# Patient Record
Sex: Male | Born: 1955 | Race: Black or African American | Hispanic: No | Smoking: Current every day smoker
Health system: Southern US, Community
[De-identification: ages and names within clinical notes are randomized; demographics above are authoritative.]

## PROBLEM LIST (undated history)

## (undated) DIAGNOSIS — I10 Essential (primary) hypertension: Secondary | ICD-10-CM

## (undated) DIAGNOSIS — N184 Chronic kidney disease, stage 4 (severe): Secondary | ICD-10-CM

## (undated) DIAGNOSIS — I82409 Acute embolism and thrombosis of unspecified deep veins of unspecified lower extremity: Secondary | ICD-10-CM

## (undated) DIAGNOSIS — B1920 Unspecified viral hepatitis C without hepatic coma: Secondary | ICD-10-CM

## (undated) DIAGNOSIS — E162 Hypoglycemia, unspecified: Secondary | ICD-10-CM

## (undated) DIAGNOSIS — Z91148 Patient's other noncompliance with medication regimen for other reason: Secondary | ICD-10-CM

## (undated) DIAGNOSIS — Z9114 Patient's other noncompliance with medication regimen: Secondary | ICD-10-CM

## (undated) DIAGNOSIS — I509 Heart failure, unspecified: Secondary | ICD-10-CM

## (undated) DIAGNOSIS — E109 Type 1 diabetes mellitus without complications: Secondary | ICD-10-CM

## (undated) DIAGNOSIS — E119 Type 2 diabetes mellitus without complications: Secondary | ICD-10-CM

## (undated) DIAGNOSIS — R569 Unspecified convulsions: Secondary | ICD-10-CM

## (undated) DIAGNOSIS — M1990 Unspecified osteoarthritis, unspecified site: Secondary | ICD-10-CM

## (undated) DIAGNOSIS — I639 Cerebral infarction, unspecified: Secondary | ICD-10-CM

## (undated) DIAGNOSIS — A159 Respiratory tuberculosis unspecified: Secondary | ICD-10-CM

## (undated) HISTORY — PX: LACERATION REPAIR: SHX5168

## (undated) HISTORY — DX: Type 2 diabetes mellitus without complications: E11.9

## (undated) HISTORY — PX: OTHER SURGICAL HISTORY: SHX169

---
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
## 2007-01-16 ENCOUNTER — Encounter: Payer: Self-pay | Admitting: Cardiology

## 2008-03-05 ENCOUNTER — Encounter: Payer: Self-pay | Admitting: Cardiology

## 2008-11-17 ENCOUNTER — Ambulatory Visit
Admit: 2008-11-17 | Discharge: 2008-11-17 | Disposition: A | Discharge status: Home / Self Care | Payer: Self-pay | Source: Ambulatory Visit | Attending: Retina Ophthalmology | Admitting: Retina Ophthalmology

## 2008-11-24 ENCOUNTER — Ambulatory Visit
Admit: 2008-11-24 | Discharge: 2008-11-24 | Disposition: A | Discharge status: Home / Self Care | Payer: Self-pay | Source: Ambulatory Visit | Attending: Optometry | Admitting: Optometry

## 2009-03-19 ENCOUNTER — Ambulatory Visit: Payer: Self-pay | Admitting: Retina Ophthalmology

## 2009-03-19 ENCOUNTER — Ambulatory Visit: Payer: Self-pay

## 2009-09-17 ENCOUNTER — Ambulatory Visit: Payer: Self-pay | Admitting: Retina Ophthalmology

## 2009-10-19 ENCOUNTER — Ambulatory Visit: Payer: Self-pay | Admitting: Retina Ophthalmology

## 2009-10-19 ENCOUNTER — Ambulatory Visit: Payer: Self-pay

## 2010-04-19 ENCOUNTER — Ambulatory Visit: Payer: Self-pay | Admitting: Retina Ophthalmology

## 2010-05-27 ENCOUNTER — Ambulatory Visit: Payer: Self-pay | Admitting: Retina Ophthalmology

## 2010-06-02 ENCOUNTER — Ambulatory Visit: Payer: Self-pay | Admitting: Retina Ophthalmology

## 2010-06-02 ENCOUNTER — Ambulatory Visit: Payer: Self-pay

## 2010-06-28 ENCOUNTER — Encounter: Payer: Self-pay | Admitting: Retina Ophthalmology

## 2010-06-28 ENCOUNTER — Ambulatory Visit: Payer: Self-pay

## 2010-06-28 ENCOUNTER — Ambulatory Visit: Payer: Self-pay | Admitting: Retina Ophthalmology

## 2010-06-28 NOTE — Ambulatory Procedure (Signed)
 Intravitreal Injection    Miguel Garza    [birth year]    Procedure Date: 06/28/2010    Order: Study Drug  Lot # [id number] Exp 03/2010    Eye: right    Allergies: Allergies not on file    Latex Allergy: No    Current Medications: No current outpatient prescriptions on file.      Procedure Note:    Proparacaine 0.5% gtt OD  Lidocaine 2% Jelly OD  Lid speculum  Betadine 10% swab to area  Specimens:   No orders of the defined types were placed in this encounter.       Quadrant ST  Central Retinal Artery perfusion: Yes  Complications: No.  Sterile Saline Eye Rinse: Yes.  Polytrim 1gtt OD          Procedure Comments: Procedure completed patient tolerated well. D/C home.  Pain Scale Pre-Procedure: 0  Pain Scale Post-Procedure: 0

## 2010-06-28 NOTE — Assessment & Plan Note (Signed)
 Study patient  [facility] protocol S  See study chart.

## 2010-07-29 ENCOUNTER — Ambulatory Visit: Payer: Self-pay

## 2010-07-29 ENCOUNTER — Encounter: Payer: Self-pay | Admitting: Retina Ophthalmology

## 2010-07-29 ENCOUNTER — Ambulatory Visit: Payer: Self-pay | Admitting: Retina Ophthalmology

## 2010-07-29 NOTE — Ambulatory Procedure (Signed)
Intravitreal Injection    Rorke Pagliarulo    09-15-1955    Procedure Date: 07/29/2010      Order:  Study Drug #[id number] OD  Lot # [id number]  Exp. March 2013  Eye: right    Allergies: Allergies not on file    Latex Allergy: No    Current Medications: No current outpatient prescriptions on file.      Procedure Note:        Specimens:   No orders of the defined types were placed in this encounter.       Quadrant  4 mm pl ST, lid speculum  Proparacaine 0.5% 1 drop OD  Lidocaine gel 2% OD  Central Retinal Artery perfusion: Yes  Complications: No.  Sterile Saline Eye Rinse: Yes.  Polytrim gtt 1:OD  Patch: No  Procedure Comments: Tolerated procedure well.  Instructions given to patient and discharged to home.  Billing sheet with patient for check-out   Pain Scale Pre-Procedure: 0  Pain Scale Post-Procedure: 0

## 2010-07-29 NOTE — Assessment & Plan Note (Signed)
Study patient  [facility] protocol S  See study chart.

## 2010-08-01 ENCOUNTER — Telehealth: Payer: Self-pay

## 2010-08-01 NOTE — Telephone Encounter (Signed)
Post study drug injection follow-up.  Voicemail message left to call if any vision change, pain or other issues.

## 2010-08-19 ENCOUNTER — Encounter: Payer: Self-pay | Admitting: Retina Ophthalmology

## 2010-08-19 ENCOUNTER — Ambulatory Visit: Payer: Self-pay

## 2010-08-19 ENCOUNTER — Ambulatory Visit: Payer: Self-pay | Admitting: Retina Ophthalmology

## 2010-08-19 NOTE — Assessment & Plan Note (Signed)
Study patient  [facility] protocol S  See study chart.

## 2010-08-19 NOTE — Ambulatory Procedure (Signed)
Intravitreal Injection    Miguel Garza    1955-03-19    Procedure Date: 08/19/2010    Order: Study Drug lot # [id number] Exp: 03/2011    Eye: right    Allergies: Allergies not on file    Latex Allergy: No    Current Medications: No current outpatient prescriptions on file.      Procedure Note:        Specimens:   No orders of the defined types were placed in this encounter.       Quadrant  Central Retinal Artery perfusion: Yes  Complications: No.  Sterile Saline Eye Rinse: Yes.    Polytrim gtt 1:OD  Patch: No          Procedure Comments: Proparacaine 0.5% 32 drops followed by 2% Xylocaine gel 1/4 inch topically to OD. Study drug OD per Dr. Johnnette Litter. Well tolerated. Discharged home, instructions provided.  Pain Scale Pre-Procedure: 0  Pain Scale Post-Procedure: 0

## 2010-09-16 ENCOUNTER — Ambulatory Visit: Payer: Self-pay | Admitting: Retina Ophthalmology

## 2010-09-16 ENCOUNTER — Ambulatory Visit: Payer: Self-pay

## 2010-09-16 ENCOUNTER — Encounter: Payer: Self-pay | Admitting: Retina Ophthalmology

## 2010-09-16 NOTE — Progress Notes (Signed)
Subjective:   Subjective8/17/2012   Chief Complaint   Patient presents with   . Diabetes     visoin stable     HPI     Study patient  [facility] protocol S  See study chart.        No current outpatient prescriptions on file.   Review of patient's allergies indicates not on file.   No past medical history on file.   Past Surgical History   Procedure Date   . Study drug injection od 06/28/10/ polytrim      consent 06/02/10   . Study drug #3 07/29/10 consent 07/29/10 polytrim start 06/02/10   . Study drug # 4 intravitreally 08/19/2010       History   Smoking status   . Not on file   Smokeless tobacco   . Not on file      History   Alcohol Use: Not on file      History   Drug Use Not on file      Specialty Problems             Ophthalmology Problems    Proliferative diabetic retinopathy                  Objective:   ObjectiveThere were no vitals filed for this visit.    Base Ophthalmology Exam        Visual Acuity    Right Left   Dist cc 20/20 20/50   Method: ETDRS   Tonometry    Right Left   Pressure 14 17   Method: Tonopen   Time: 9:03 AM   Dilation   Right eye: 2.5% Phenylephrine, 1.0% Tropicamide @ 9:04 AM      Main Ophthalmology Exam        External Exam    Right Left   External Normal Normal   Slit Lamp Exam    Right Left   Lids/Lashes Normal Normal   Conjunctiva/Sclera White and quiet White and quiet   Cornea Clear Clear   Anterior Chamber Deep and quiet Deep and quiet   Iris Round and reactive Round and reactive   Lens 2+ Nuclear sclerosis Posterior chamber intraocular lens   Vitreous Normal Normal   Fundus Exam    Right Left   Disc Normal not dilated   C/D Ratio 0.3    Macula Normal    Vessels NVE regressed nasal and inferior, no VH    Periphery 4 quad [facility]        Neuro/Psych   Oriented x3: Yes                  No annotated images are attached to the encounter.         Assessment/Plan:   AssessmentProliferative diabetic retinopathy  [facility] protocol S  See study chart.    RIGHT EYE:  Receiving anti-VEGF injections for PDR per  above study protocol    LEFT EYE:   S/p PPV/EL 03/05/08--stable

## 2010-09-16 NOTE — Ambulatory Procedure (Signed)
Intravitreal Injection    Nakota Lichtenstein    1956-01-16    Procedure Date: 09/16/2010    Order: [facility] drug #5 OD  Lot # [id number]   exp. March 2013    Eye: right    Allergies: Allergies not on file    Latex Allergy: No    Current Medications: No current outpatient prescriptions on file.      Procedure Note:        Specimens:   No orders of the defined types were placed in this encounter.       Quadrant  Central Retinal Artery perfusion: Yes  Complications: No.  Sterile Saline Eye Rinse: Yes.  Polytrim drops 1 drop OD  Patch: No          Procedure Comments: Tolerated procedure well.  Instructions given to patient and discharged to home.  Billing sheet with patient for check-out   Pain Scale Pre-Procedure: 0  Pain Scale Post-Procedure: 0

## 2010-09-16 NOTE — Assessment & Plan Note (Signed)
[facility] protocol S  See study chart.    RIGHT EYE:  Receiving anti-VEGF injections for PDR per above study protocol    LEFT EYE:   S/p PPV/EL 03/05/08--stable

## 2010-10-18 ENCOUNTER — Ambulatory Visit: Payer: Self-pay | Admitting: Retina Ophthalmology

## 2010-10-18 ENCOUNTER — Ambulatory Visit: Payer: Self-pay

## 2010-10-18 ENCOUNTER — Encounter: Payer: Self-pay | Admitting: Retina Ophthalmology

## 2010-10-18 NOTE — Ambulatory Procedure (Signed)
Intravitreal Injection    Aziz Cheairs    1956-01-21    Procedure Date: 10/18/2010    Order:[facility]CR-S study drug lot [id number] Exp: 03/2011    Eye: right    Allergies: Allergies not on file    Latex Allergy: No    Current Medications: No current outpatient prescriptions on file.      Procedure Note:        Specimens:   No orders of the defined types were placed in this encounter.       Quadrant  Central Retinal Artery perfusion: Yes  Complications: No.  Sterile Saline Eye Rinse: Yes.      Patch: No  Polytrim OD        Procedure Comments: Proparacaine 0.5% 2 drops followed by 2% Xylocaine gel 1/4 inch OD. Study Drug injection OD per Dr.l Diloreto. Well tolerated. Discharged home. Instructions provided.  Pain Scale Pre-Procedure: 0  Pain Scale Post-Procedure: 0

## 2010-10-18 NOTE — Assessment & Plan Note (Signed)
Study patient  [facility] protocol S  See study chart.    RIGHT EYE:  Receiving anti-VEGF injections for PDR per above study protocol    LEFT EYE:   S/p PPV/EL 03/05/08--stable

## 2010-10-21 ENCOUNTER — Encounter: Payer: Self-pay | Admitting: Retina Ophthalmology

## 2010-11-18 ENCOUNTER — Ambulatory Visit: Payer: Self-pay

## 2010-11-18 ENCOUNTER — Ambulatory Visit: Payer: Self-pay | Admitting: Retina Ophthalmology

## 2010-11-18 ENCOUNTER — Encounter: Payer: Self-pay | Admitting: Retina Ophthalmology

## 2010-11-18 NOTE — Assessment & Plan Note (Signed)
Study patient  [facility] protocol S  See study chart.    RIGHT EYE:  Receiving anti-VEGF injections for PDR per above study protocol  Ranibizumab injection #7 today (11/18/10)    LEFT EYE:   S/p PPV/EL 03/05/08--stable

## 2010-11-18 NOTE — Ambulatory Procedure (Signed)
Intravitreal Injection    Miguel Garza    25-Mar-1955    Procedure Date: 11/18/2010    Order: [employer] study drug VHQ#[id number] Exp: 03/2011    Eye: right    Allergies: Allergies not on file    Latex Allergy: No    Current Medications: No current outpatient prescriptions on file.      Procedure Note:        Specimens:   No orders of the defined types were placed in this encounter.       Quadrant  Central Retinal Artery perfusion: Yes  Complications: No.  Sterile Saline Eye Rinse: Yes.    polytrim gtt 1:OD  Patch: No          Procedure Comments: Proparacaine 0.5% 2 drops followed by 2% Xylocaine gel 1/4 inch OD. Study ALucentis OD per Dr. Johnnette Litter. Well tolerated. Discharged home. Instructions provided.  Pain Scale Pre-Procedure: 0  Pain Scale Post-Procedure: 0

## 2010-11-18 NOTE — Progress Notes (Signed)
Subjective:   Subjective10/19/2012   No chief complaint on file.    HPI     Protocol S [facility] ranibizumab for PDR        No current outpatient prescriptions on file.   Review of patient's allergies indicates not on file.   No past medical history on file.   Past Surgical History   Procedure Date   . Study drug injection #2od 06/28/10/ polytrim      consent 06/02/10 and initial injection#1   . Study drug #3 07/29/10 consent 07/29/10 polytrim start 06/02/10   . Study drug # 4 intravitreally 08/19/2010    . Study drug #5 od 09/16/10 polytrim    . Study drug #6 od 10/18/2010       History   Smoking status   . Not on file   Smokeless tobacco   . Not on file      History   Alcohol Use: Not on file      History   Drug Use Not on file      Specialty Problems             Ophthalmology Problems    Proliferative diabetic retinopathy                  Objective:   ObjectiveThere were no vitals filed for this visit.    Base Ophthalmology Exam        Visual Acuity    Right Left   Dist cc 20/20 20/32   Method: ETDRS   Correction: Glasses   Tonometry    Right Left   Pressure 18 20   Method: Tonopen   Time: 9:11 AM   Manifest Refraction    Sphere Cylinder Axis Dist   Right -0.75 +1.25 130 20/20   Left -1.00 +1.25 135 20/32   Dilation   Right eye: 2.5% Phenylephrine, 1.0% Tropicamide @ 9:12 AM      Main Ophthalmology Exam        External Exam    Right Left   External Normal Normal   Slit Lamp Exam    Right Left   Lids/Lashes Normal Normal   Conjunctiva/Sclera White and quiet White and quiet   Cornea Clear Clear   Anterior Chamber Deep and quiet Deep and quiet   Iris Round and reactive Round and reactive   Lens 2+ Nuclear sclerosis Posterior chamber intraocular lens   Vitreous Normal Normal   Fundus Exam    Right Left   Disc Normal not dilated   C/D Ratio 0.3    Macula Normal    Vessels NVE regressed nasal and inferior, no VH    Periphery 4 quad [facility]        Neuro/Psych   Oriented x3: Yes                  No annotated images are attached to the  encounter.         Assessment/Plan:   AssessmentProliferative diabetic retinopathy  Study patient  [facility] protocol S  See study chart.    RIGHT EYE:  Receiving anti-VEGF injections for PDR per above study protocol  Ranibizumab injection #7 today (11/18/10)    LEFT EYE:   S/p PPV/EL 03/05/08--stable

## 2010-12-16 ENCOUNTER — Encounter: Payer: Self-pay | Admitting: Retina Ophthalmology

## 2010-12-16 ENCOUNTER — Ambulatory Visit: Payer: Self-pay | Admitting: Retina Ophthalmology

## 2010-12-16 ENCOUNTER — Ambulatory Visit: Payer: Self-pay

## 2010-12-16 NOTE — Assessment & Plan Note (Signed)
 Study patient  [facility] protocol S  See study chart.    RIGHT EYE:  Receiving anti-VEGF injections for PDR per above study protocol  Ranibizumab injection  X 7 (11/18/10)  F/u 24m per protocol    LEFT EYE:   S/p PPV/EL 03/05/08--stable

## 2010-12-16 NOTE — Progress Notes (Signed)
Subjective:   Subjective11/16/2012   No chief complaint on file.        No current outpatient prescriptions on file.   Review of patient's allergies indicates not on file.   No past medical history on file.   Past Surgical History   Procedure Date   . Study drug injection #2od 06/28/10/ polytrim      consent 06/02/10 and initial injection#1   . Study drug #3 07/29/10 consent 07/29/10 polytrim start 06/02/10   . Study drug # 4 intravitreally 08/19/2010    . Study drug #5 od 09/16/10 polytrim    . Study drug #6 od 10/18/2010    . Study  drug #7 od 11/18/2010       History   Smoking status   . Not on file   Smokeless tobacco   . Not on file      History   Alcohol Use: Not on file      History   Drug Use Not on file      Specialty Problems             Ophthalmology Problems    Proliferative diabetic retinopathy                  Objective:   ObjectiveThere were no vitals filed for this visit.    Base Ophthalmology Exam        Visual Acuity    Right Left   Dist cc 20/20 20/32   Method: Snellen - Linear   Tonometry    Right Left   Pressure 18 19   Method: Tonopen   Time: 9:59 AM   Dilation   Both eyes: 2.5% Phenylephrine, 1.0% Tropicamide @ 9:45 AM      Main Ophthalmology Exam        External Exam    Right Left   External Normal Normal   Slit Lamp Exam    Right Left   Lids/Lashes Normal Normal   Conjunctiva/Sclera White and quiet White and quiet   Cornea Clear Clear   Anterior Chamber Deep and quiet Deep and quiet   Iris Round and reactive Round and reactive   Lens 2+ Nuclear sclerosis Posterior chamber intraocular lens   Vitreous Normal Normal   Fundus Exam    Right Left   Disc Normal not dilated   C/D Ratio 0.3    Macula Normal    Vessels NVE regressed nasal and inferior, no VH    Periphery 4 quad [facility]        Neuro/Psych   Oriented x3: Yes   Mood/Affect: Normal                  No annotated images are attached to the encounter.         Assessment/Plan:   AssessmentProliferative diabetic retinopathy  Study patient  [facility] protocol  S  See study chart.    RIGHT EYE:  Receiving anti-VEGF injections for PDR per above study protocol  Ranibizumab injection  X 7 (11/18/10)  F/u 65m per protocol    LEFT EYE:   S/p PPV/EL 03/05/08--stable

## 2011-01-13 ENCOUNTER — Ambulatory Visit: Payer: Self-pay | Admitting: Retina Ophthalmology

## 2011-01-13 ENCOUNTER — Ambulatory Visit: Payer: Self-pay

## 2011-02-10 ENCOUNTER — Encounter: Payer: Self-pay | Admitting: Retina Ophthalmology

## 2011-02-14 ENCOUNTER — Ambulatory Visit: Payer: Self-pay | Admitting: Retina Ophthalmology

## 2011-02-14 ENCOUNTER — Ambulatory Visit: Payer: Self-pay

## 2011-03-10 ENCOUNTER — Ambulatory Visit: Payer: Self-pay

## 2011-03-10 ENCOUNTER — Ambulatory Visit: Payer: Self-pay | Admitting: Retina Ophthalmology

## 2011-03-10 NOTE — Assessment & Plan Note (Signed)
 Study patient  [facility] protocol S  See study chart.    RIGHT EYE:  Receiving anti-VEGF injections for PDR per above study protocol  Ranibizumab injection  X 7 (11/18/10)  F/u 24m per protocol    LEFT EYE:   S/p PPV/EL 03/05/08--stable

## 2011-04-07 ENCOUNTER — Ambulatory Visit: Payer: Self-pay | Admitting: Retina Ophthalmology

## 2011-04-07 ENCOUNTER — Ambulatory Visit: Payer: Self-pay

## 2011-04-07 NOTE — Progress Notes (Signed)
Subjective:   Subjective3/08/2011   No chief complaint on file.    HPI     [facility] Study visit::   Hx: PDR   RIGHT EYE:   Receiving anti-VEGF injections for PDR per above study protocol   Ranibizumab injection X 7 (11/18/10)     LEFT EYE:   S/p PPV/EL 03/05/08           No current outpatient prescriptions on file.   Review of patient's allergies indicates not on file.   No past medical history on file.   Past Surgical History   Procedure Date    Study drug injection #2od 06/28/10/ polytrim      consent 06/02/10 and initial injection#1    Study drug #3 07/29/10 consent 07/29/10 polytrim start 06/02/10    Study drug # 4 intravitreally 08/19/2010     Study drug #5 od 09/16/10 polytrim     Study drug #6 od 10/18/2010     Study  drug #7 od 11/18/2010       History   Smoking status    Passive Smoker   Smokeless tobacco    Not on file      History   Alcohol Use: Not on file      History   Drug Use Not on file      Specialty Problems             Ophthalmology Problems    Proliferative diabetic retinopathy                  Objective:   ObjectiveThere were no vitals filed for this visit.    Base Ophthalmology Exam        Visual Acuity    Right Left   Dist cc 20/16 20/50   Method: EVA-ETDRS   Correction: Glasses   Tonometry    Right Left   Pressure 14 16   Method: Tonopen   Time: 10:06 AM   Manifest Refraction    Sphere Cylinder Axis   Right -1.00 +2.75 135   Left -1.00 +1.25 135    Comments: OD only refracted today, OS from previous   Dilation   Both eyes: 2.5% Phenylephrine, 1.0% Tropicamide @ 10:06 AM   Pupils    Pupils React APD   Right PERRLA min None   Left PERRLA min None      Main Ophthalmology Exam        External Exam    Right Left   External Normal Normal   Slit Lamp Exam    Right Left   Lids/Lashes Normal Normal   Conjunctiva/Sclera White and quiet White and quiet   Cornea Clear Clear   Anterior Chamber Deep and quiet Deep and quiet   Iris Round and reactive Round and reactive   Lens 2+ Nuclear sclerosis Posterior chamber  intraocular lens   Vitreous Normal Normal   Fundus Exam    Right Left   Disc Normal not dilated   C/D Ratio 0.3 0.3   Macula Normal Normal   Vessels NVE regressed nasal and inferior, no VH atten   Periphery 4 quad [facility] PRP       Neuro/Psych   Oriented x3: Yes   Mood/Affect: Normal                  No annotated images are attached to the encounter.         Assessment/Plan:   AssessmentProliferative diabetic retinopathy  Study patient  [facility] protocol S  See study chart.    RIGHT EYE:  Receiving anti-VEGF injections for PDR per above study protocol  Ranibizumab injection  X 7 (11/18/10)  F/u 4m per protocol    LEFT EYE:   S/p PPV/EL 03/05/08--stable

## 2011-04-07 NOTE — Assessment & Plan Note (Signed)
 Study patient  [facility] protocol S  See study chart.    RIGHT EYE:  Receiving anti-VEGF injections for PDR per above study protocol  Ranibizumab injection  X 7 (11/18/10)  F/u 24m per protocol    LEFT EYE:   S/p PPV/EL 03/05/08--stable

## 2011-05-05 ENCOUNTER — Ambulatory Visit: Payer: Self-pay | Admitting: Retina Ophthalmology

## 2011-05-05 ENCOUNTER — Ambulatory Visit: Payer: Self-pay

## 2011-05-05 NOTE — Assessment & Plan Note (Signed)
Study patient  [facility] protocol S  See study chart.    RIGHT EYE:  Receiving anti-VEGF injections for PDR per above study protocol  Ranibizumab injection  X 7 (11/18/10)  F/u 24m per protocol    LEFT EYE:   S/p PPV/EL 03/05/08--stable

## 2011-05-05 NOTE — Progress Notes (Signed)
Subjective:   Subjective4/05/2011   Chief Complaint   Patient presents with   . Diabetes     [facility] study protocol S     HPI     [facility] Study visit:  Study patient  [facility] protocol S  See study chart.  RIGHT EYE:  Receiving anti-VEGF injections for PDR per above study protocol  Ranibizumab injection  X 7 (11/18/10)  LEFT EYE:    S/p PPV/EL (03/05/08)               No current outpatient prescriptions on file.   Review of patient's allergies indicates no known allergies (drug, envir, food or latex).   No past medical history on file.   Past Surgical History   Procedure Date   . Study drug injection #2od 06/28/10/ polytrim      consent 06/02/10 and initial injection#1   . Study drug #3 07/29/10 consent 07/29/10 polytrim start 06/02/10   . Study drug # 4 intravitreally 08/19/2010    . Study drug #5 od 09/16/10 polytrim    . Study drug #6 od 10/18/2010    . Study  drug #7 od 11/18/2010       History   Smoking status   . Passive Smoker   Smokeless tobacco   . Not on file      History   Alcohol Use: Not on file      History   Drug Use Not on file      Specialty Problems             Ophthalmology Problems    Proliferative diabetic retinopathy                  Objective:   ObjectiveThere were no vitals filed for this visit.    Main Ophthalmology Exam        External Exam    Right Left   External Normal Normal   Slit Lamp Exam    Right Left   Lids/Lashes Normal Normal   Conjunctiva/Sclera White and quiet White and quiet   Cornea Clear Clear   Anterior Chamber Deep and quiet Deep and quiet   Iris Round and reactive Round and reactive   Lens 2+ Nuclear sclerosis Posterior chamber intraocular lens   Vitreous Normal Normal   Fundus Exam    Right Left   Disc Normal not dilated   C/D Ratio 0.3 0.3   Macula Normal Normal   Vessels NVE regressed nasal and inferior, no VH atten   Periphery 4 quad [facility] PRP       Neuro/Psych   Oriented x3: Yes   Mood/Affect: Normal                  No annotated images are attached to the encounter.          Assessment/Plan:   AssessmentProliferative diabetic retinopathy  Study patient  [facility] protocol S  See study chart.    RIGHT EYE:  Receiving anti-VEGF injections for PDR per above study protocol  Ranibizumab injection  X 7 (11/18/10)  F/u 91m per protocol    LEFT EYE:   S/p PPV/EL 03/05/08--stable

## 2011-06-02 ENCOUNTER — Ambulatory Visit: Payer: Self-pay

## 2011-06-02 ENCOUNTER — Encounter: Payer: Self-pay | Admitting: Retina Ophthalmology

## 2011-06-02 ENCOUNTER — Ambulatory Visit: Payer: Self-pay | Admitting: Retina Ophthalmology

## 2011-06-02 NOTE — Procedures (Signed)
Intravitreal Injection    Miguel Garza    09-Mar-1955    Procedure Date: 06/02/2011    Order: Study drug #[id number]  Lot. # [id number] exp. 07/2012  Given at 14:10    Eye: right    Allergies: No Known Allergies (drug, envir, food or latex)    Latex Allergy: No    Current Medications: No current outpatient prescriptions on file.      Procedure Note:        Specimens:   No orders of the defined types were placed in this encounter.     proparacaine 0.5% gtt OD  Lidocaine 2% Jelly OD  Lid Speculum  Betadine 10% Swab OD   Quadrant: Superotemporal  Central Retinal Artery perfusion: Yes  Complications: No  Sterile Saline Eye Rinse: Yes  Polytrim 1 drop OD    Procedure Comments: Tolerated procedure well.  Instructions given to patient and discharged to home.  Patient verbalizes understanding.  Billing sheet with patient for check-out   Pain Scale Pre-Procedure: 0  Pain Scale Post-Procedure: 0

## 2011-06-02 NOTE — Assessment & Plan Note (Signed)
Study patient  [facility] protocol S  See study chart.    RIGHT EYE:  Receiving anti-VEGF injections for PDR per above study protocol  Ranibizumab injection  X 8 (06/02/11)  F/u 44m per protocol    LEFT EYE:   S/p PPV/EL 03/05/08--stable

## 2011-06-02 NOTE — Progress Notes (Signed)
Subjective:   Subjective5/03/2011   Chief Complaint   Patient presents with   . Diabetes     [facility]CR study f/u         No current outpatient prescriptions on file.   Review of patient's allergies indicates no known allergies (drug, envir, food or latex).   No past medical history on file.   Past Surgical History   Procedure Date   . Study drug injection #2od 06/28/10/ polytrim      consent 06/02/10 and initial injection#1   . Study drug #3 07/29/10 consent 07/29/10 polytrim start 06/02/10   . Study drug # 4 intravitreally 08/19/2010    . Study drug #5 od 09/16/10 polytrim    . Study drug #6 od 10/18/2010    . Study  drug #7 od 11/18/2010       History   Smoking status   . Passive Smoker   Smokeless tobacco   . Not on file      History   Alcohol Use: Not on file      History   Drug Use Not on file      Specialty Problems             Ophthalmology Problems    Proliferative diabetic retinopathy                  Objective:   ObjectiveThere were no vitals filed for this visit.    Base Ophthalmology Exam        Visual Acuity    Right Left Both   Dist cc 20/16 20/20    Method: EVA-ETDRS   Visual Acuity #2    Right Left Both   Dist cc   20/20   Method: EVA-ETDRS    Comments: Rx in trial frames, VA2 with own glasses   Tonometry    Right Left   Pressure 14 13   Method: Tonopen   Time: 9:37 AM   Wearing Rx    Sphere Cylinder Axis Add   Right pl +0.25 142 +2.75   Left -1.00 +1.25 134 +2.75   Type: Bifocal   Manifest Refraction    Sphere Cylinder Axis Dist   Right -1.00 +3.25 135 20/16   Left +0.75 +0.75 160 20/20    Comments: OU done today for 1 yr visit for study   Pupils    React APD   Right Minimal None   Left Minimal None      Main Ophthalmology Exam        External Exam    Right Left   External Normal Normal   Slit Lamp Exam    Right Left   Lids/Lashes Normal Normal   Conjunctiva/Sclera White and quiet White and quiet   Cornea Clear Clear   Anterior Chamber Deep and quiet Deep and quiet   Iris Round and reactive Round and reactive    Lens 2+ Nuclear sclerosis Posterior chamber intraocular lens   Vitreous Normal Normal   Fundus Exam    Right Left   Disc Normal not dilated   C/D Ratio 0.3 0.3   Macula Normal Normal   Vessels NVE regressed nasal and inferior, no VH atten   Periphery 4 quad [facility] PRP       Neuro/Psych   Oriented x3: Yes   Mood/Affect: Normal                  No annotated images are attached to the encounter.  Assessment/Plan:   AssessmentProliferative diabetic retinopathy  Study patient  [facility] protocol S  See study chart.    RIGHT EYE:  Receiving anti-VEGF injections for PDR per above study protocol  Ranibizumab injection  X 8 (06/02/11)  F/u 28m per protocol    LEFT EYE:   S/p PPV/EL 03/05/08--stable

## 2011-07-07 ENCOUNTER — Encounter: Payer: Self-pay | Admitting: Retina Ophthalmology

## 2011-07-07 ENCOUNTER — Ambulatory Visit: Payer: Self-pay | Admitting: Retina Ophthalmology

## 2011-07-07 NOTE — Progress Notes (Signed)
Subjective:   Subjective6/07/2011   No chief complaint on file.    HPI    [facility] studyt visit:  Hx: Study patient   DRCR protocol S   See study chart.   RIGHT EYE:  Receiving anti-VEGF injections for PDR per above study protocol  Ranibizumab injection X 8 (06/02/11)  LEFT EYE:   S/p PPV/EL (03/05/08)        No current outpatient prescriptions on file.   Review of patient's allergies indicates no known allergies (drug, envir, food or latex).   No birth history on file.  No past medical history on file.   Past Surgical History   Procedure Laterality Date   . Study drug injection #2od 06/28/10/ polytrim       consent 06/02/10 and initial injection#1   . Study drug #3 07/29/10 consent 07/29/10 polytrim  start 06/02/10   . Study drug # 4 intravitreally 08/19/2010     . Study drug #5 od 09/16/10 polytrim     . Study drug #6 od 10/18/2010     . Study  drug #7 od 11/18/2010     . Study drug #8 od 06/02/11        History   Smoking status   . Passive Smoke Exposure - Never Smoker   Smokeless tobacco   . Not on file      History   Alcohol Use: Not on file      History   Drug Use Not on file      Specialty Problems       Ophthalmology Problems    Proliferative diabetic retinopathy                  Objective:   ObjectiveThere were no vitals filed for this visit.    Base Eye Exam       Visual Acuity    Right Left   Dist cc 20/20 20/32   Method: EVA-ETDRS   Correction: Glasses   Tonometry    Right Left   Pressure 19 16   Method: Tonopen   Time: 9:35 AM   Pupils    React APD   Right Minimal None   Left Minimal None    Comments: Small pupils, OD slightly larger, no APD   Neuro/Psych   Oriented x3: Yes   Mood/Affect: Normal   Dilation   Both eyes: 2.5% Phenylephrine, 1.0% Tropicamide @ 9:35 AM      Slit Lamp and Fundus Exam       External Exam    Right Left   External Normal Normal   Slit Lamp Exam    Right Left   Lids/Lashes Normal Normal   Conjunctiva/Sclera White and quiet White and quiet   Cornea Clear Clear   Anterior Chamber Deep and quiet Deep  and quiet   Iris Round and reactive Round and reactive   Lens 2+ Nuclear sclerosis Posterior chamber intraocular lens   Vitreous Normal Normal   Fundus Exam    Right Left   Disc Normal not dilated   C/D Ratio 0.3 0.3   Macula Normal Normal   Vessels NVE regressed nasal and inferior, no VH atten   Periphery 4 quad SIR[facility] PRP      Refraction       Manifest Refraction    Sphere Cylinder Axis Dist   Right -0.50 +2.00 137 20/20   Left +0.75 +0.75 160 20/32    Comments: OD done today, OS done at last visit.  No annotated images are attached to the encounter.         Assessment/Plan:   AssessmentProliferative diabetic retinopathy  Study patient  [facility] protocol S  See study chart.    RIGHT EYE:  Receiving anti-VEGF injections for PDR per above study protocol  Ranibizumab injection  X 9 (07/07/11)  F/u 100m per protocol    LEFT EYE:   S/p PPV/EL 03/05/08--stable

## 2011-07-07 NOTE — Assessment & Plan Note (Signed)
Study patient  [facility] protocol S  See study chart.    RIGHT EYE:  Receiving anti-VEGF injections for PDR per above study protocol  Ranibizumab injection  X 9 (07/07/11)  F/u 106m per protocol    LEFT EYE:   S/p PPV/EL 03/05/08--stable

## 2011-07-07 NOTE — Procedures (Cosign Needed)
Intravitreal Injection    Miguel Garza    14-Jul-1955    Procedure Date: 07/07/2011    Order: Study Drug    Eye: right    Allergies: No Known Allergies (drug, envir, food or latex)    Latex Allergy: No    Current Medications: No current outpatient prescriptions on file.      Procedure Note:        Specimens:   No orders of the defined types were placed in this encounter.       Quadrant: Superotemporal  Central Retinal Artery perfusion: Yes  Complications: No  Sterile Saline Eye Rinse: Yes  Polytrim gtt 1: OD        Procedure Comments: Proparacaine 0.5% 2 drops followed by 2% Xylocaine gel 1/4 inch OD. Study drug lot# [id number] OD per Dr. Johnnette Litter. Well tolerated. Discharged home. Instructions provided. Patient verbalizes understanding of and compliance with instructions.  Pain Scale Pre-Procedure: 0  Pain Scale Post-Procedure: 0

## 2011-08-04 ENCOUNTER — Encounter: Payer: Self-pay | Admitting: Retina Ophthalmology

## 2011-08-04 ENCOUNTER — Ambulatory Visit: Payer: Self-pay | Admitting: Retina Ophthalmology

## 2011-08-04 ENCOUNTER — Ambulatory Visit: Payer: Self-pay

## 2011-08-04 NOTE — Assessment & Plan Note (Addendum)
Study patient  [facility] protocol S  See study chart.    RIGHT EYE:  Receiving anti-VEGF injections for PDR per above study protocol  Ranibizumab injection  X 10 (08/04/11)  F/u 24m per protocol    LEFT EYE:   S/p PPV/EL 03/05/08--stable

## 2011-08-04 NOTE — Procedures (Signed)
Intravitreal Injection    Fillip Boyadjian    [birth year]    Procedure Date: 08/04/2011    Order: Study drug Lot #: [id number] Exp. Date: 07/14    Eye: right    Allergies: No Known Allergies (drug, envir, food or latex)    Latex Allergy: No    Current Medications: No current outpatient prescriptions on file.      Procedure Note:   proparacaine 0.5% 1gtt OD  lidocaine jelly 2% to right eye  Betadine 10% swab to right eye  Lid speculum right eye      Specimens:   No orders of the defined types were placed in this encounter.       Quadrant: Superotemporal  Central Retinal Artery perfusion: Yes  Complications: No  Sterile Saline Eye Rinse: Yes  Polytrim gtt 1: OD        Procedure Comments: Procedure completed patient tolerated well, Instructions given patient verbalized understanding.  D/C with billing sheet.  Pain Scale Pre-Procedure: 0  Pain Scale Post-Procedure: 0

## 2011-09-01 ENCOUNTER — Encounter: Payer: Self-pay | Admitting: Retina Ophthalmology

## 2011-10-13 ENCOUNTER — Encounter: Payer: Self-pay | Admitting: Retina Ophthalmology

## 2011-10-13 ENCOUNTER — Ambulatory Visit: Payer: Self-pay

## 2011-10-13 ENCOUNTER — Ambulatory Visit: Payer: Self-pay | Admitting: Retina Ophthalmology

## 2011-10-13 NOTE — Progress Notes (Signed)
Subjective:   Subjective9/13/2013   No chief complaint on file.        No current outpatient prescriptions on file.   Review of patient's allergies indicates no known allergies (drug, envir, food or latex).   No birth history on file.  No past medical history on file.   Past Surgical History   Procedure Laterality Date   . Study drug injection #2od 06/28/10/ polytrim       consent 06/02/10 and initial injection#1   . Study drug #3 07/29/10 consent 07/29/10 polytrim  start 06/02/10   . Study drug # 4 intravitreally 08/19/2010     . Study drug #5 od 09/16/10 polytrim     . Study drug #6 od 10/18/2010     . Study  drug #7 od 11/18/2010     . Study drug #8 od 06/02/11     . Study drug #9 od 07/07/2011     . Study drug #10 od 08/04/11 Right 08/04/11     Consent 08/04/11 polytrim      History   Smoking status   . Passive Smoke Exposure - Never Smoker   Smokeless tobacco   . Not on file      History   Alcohol Use: Not on file      History   Drug Use Not on file      Specialty Problems       Ophthalmology Problems    Proliferative diabetic retinopathy(362.02)                  Objective:   ObjectiveThere were no vitals filed for this visit.    Base Eye Exam       Visual Acuity    Right Left   Dist cc 20/16 20/25   Method: EVA-ETDRS   Correction: Glasses   Tonometry    Right Left   Pressure 13 13   Method: Tonopen   Time: 9:53 AM   Pupils    Pupils   Right PERRLA   Left PERRLA   Neuro/Psych   Oriented x3: Yes   Mood/Affect: Normal   Dilation   Both eyes: 2.5% Phenylephrine, 1.0% Tropicamide @ 9:53 AM      Slit Lamp and Fundus Exam       External Exam    Right Left   External Normal Normal   Slit Lamp Exam    Right Left   Lids/Lashes Normal Normal   Conjunctiva/Sclera White and quiet White and quiet   Cornea Clear Clear   Anterior Chamber Deep and quiet Deep and quiet   Iris Round and reactive Round and reactive   Lens 2+ Nuclear sclerosis Posterior chamber intraocular lens   Vitreous Normal Normal   Fundus Exam    Right Left   Disc Normal not  dilated   C/D Ratio 0.3 0.3   Macula Normal Normal   Vessels NVE regressed nasal and inferior, no VH atten   Periphery 4 quad [facility] PRP      Refraction       Manifest Refraction    Sphere Cylinder Axis Dist   Right -0.25 +2.25 132 20/16   Left +0.75 +0.75 160 20/25    Comments: OD done today, OS done previously                  No annotated images are attached to the encounter.         Assessment/Plan:   AssessmentProliferative diabetic retinopathy(362.02)  Study patient  [facility]CR protocol S  See study chart.    RIGHT EYE:  Receiving anti-VEGF injections for PDR per above study protocol  Ranibizumab injection  X 11 (10/13/11)  F/u 87m per protocol    LEFT EYE:   S/p PPV/EL 03/05/08--stable

## 2011-10-13 NOTE — Assessment & Plan Note (Signed)
Study patient  [facility] protocol S  See study chart.    RIGHT EYE:  Receiving anti-VEGF injections for PDR per above study protocol  Ranibizumab injection  X 11 (10/13/11)  F/u 6m per protocol    LEFT EYE:   S/p PPV/EL 03/05/08--stable

## 2011-10-13 NOTE — Procedures (Addendum)
Intravitreal Injection    Miguel Garza    [birth year]    Procedure Date: 10/13/2011    Order: Study drug Lot #: [id number]     Exp. Date: 07/2012        Eye: right    Allergies: No Known Allergies (drug, envir, food or latex)    Latex Allergy: No    Current Medications: No current outpatient prescriptions on file.      Procedure Note:   proparacaine 0.5% gtt od  Lidocaine 2% Jelly od  Lid Speculum  Betadine 10% Swab od    Study Drug OD         Specimens:   No orders of the defined types were placed in this encounter.       Quadrant: Superotemporal  Central Retinal Artery perfusion: Yes  Complications: No  Sterile Saline Eye Rinse: Yes  Polytrim gtt 1: OD        Procedure Comments: Tolerated procedure well.  Instructions given to patient and discharged to home.  Patient verbalizes understanding.  Billing sheet with patient for check-out    Pain Scale Pre-Procedure: 0  Pain Scale Post-Procedure: 0

## 2011-11-07 NOTE — Progress Notes (Signed)
This encounter was created in error - please disregard.

## 2011-11-10 ENCOUNTER — Encounter: Payer: Self-pay | Admitting: Retina Ophthalmology

## 2011-11-10 ENCOUNTER — Ambulatory Visit: Payer: Self-pay

## 2011-11-10 ENCOUNTER — Ambulatory Visit: Payer: Self-pay | Admitting: Retina Ophthalmology

## 2011-11-10 NOTE — Assessment & Plan Note (Signed)
Study patient  [facility] protocol S  See study chart.    RIGHT EYE:  Receiving anti-VEGF injections for PDR per above study protocol  Ranibizumab injection  # 12 today (11/10/11)  F/u 66m per protocol    LEFT EYE:   S/p PPV/EL 03/05/08--stable

## 2011-11-10 NOTE — Progress Notes (Signed)
Subjective:   Subjective10/12/2011   No chief complaint on file.    HPI    DRCR-S Study visit:  RIGHT EYE:  Receiving anti-VEGF injections for PDR per above study protocol  anti-VEGF injection X 11 (10/13/11)  LEFT EYE:   S/p PPV/EL 03/05/08          No current outpatient prescriptions on file.   Review of patient's allergies indicates no known allergies (drug, envir, food or latex).   No birth history on file.  No past medical history on file.   Past Surgical History   Procedure Laterality Date   . Study drug injection #2od 06/28/10/ polytrim       consent 06/02/10 and initial injection#1   . Study drug #3 07/29/10 consent 07/29/10 polytrim  start 06/02/10   . Study drug # 4 intravitreally 08/19/2010     . Study drug #5 od 09/16/10 polytrim     . Study drug #6 od 10/18/2010     . Study  drug #7 od 11/18/2010     . Study drug #8 od 06/02/11     . Study drug #9 od 07/07/2011     . Study drug #10 od 08/04/11 Right 08/04/11     Consent 08/04/11 polytrim   . Study drug #11 od Right 10/13/11      History   Smoking status   . Passive Smoke Exposure - Never Smoker   Smokeless tobacco   . Not on file      History   Alcohol Use: Not on file      History   Drug Use Not on file      Specialty Problems       Ophthalmology Problems    Proliferative diabetic retinopathy(362.02)                  Objective:   ObjectiveThere were no vitals filed for this visit.    Base Eye Exam       Visual Acuity    Right Left   Dist cc 20/16 20/25   Method: EVA-ETDRS   Correction: Glasses   Tonometry    Right Left   Pressure 18 18   Method: Tonopen   Time: 9:30AM   Pupils    Pupils APD   Right PERRLA None   Left PERRLA None   Neuro/Psych   Oriented x3: Yes   Mood/Affect: Normal   Dilation   Both eyes: 2.5% Phenylephrine, 1.0% Tropicamide @ 9:30 AM      Slit Lamp and Fundus Exam       External Exam    Right Left   External Normal Normal   Slit Lamp Exam    Right Left   Lids/Lashes Normal Normal   Conjunctiva/Sclera White and quiet White and quiet   Cornea Clear Clear    Anterior Chamber Deep and quiet Deep and quiet   Iris Round and reactive Round and reactive   Lens 2+ Nuclear sclerosis Posterior chamber intraocular lens   Vitreous Normal Normal   Fundus Exam    Right Left   Disc Normal not dilated   C/D Ratio 0.3 0.3   Macula Normal Normal   Vessels NVE regressed nasal and inferior, no VH atten   Periphery 4 quad [facility] PRP      Refraction       Manifest Refraction    Sphere Cylinder Axis   Right pl +2.25 130   Left +0.75 +0.75 160    Comments: OD done  today, OS done previously                  No annotated images are attached to the encounter.         Assessment/Plan:   AssessmentProliferative diabetic retinopathy(362.02)  Study patient  [facility] protocol S  See study chart.    RIGHT EYE:  Receiving anti-VEGF injections for PDR per above study protocol  Ranibizumab injection  # 12 today (11/10/11)  F/u 51m per protocol    LEFT EYE:   S/p PPV/EL 03/05/08--stable

## 2011-11-10 NOTE — Procedures (Signed)
Intravitreal Injection    Miguel Garza    1955/03/22    Procedure Date: 11/10/2011    Order: Study drug Lot #: [id number] Exp. Date: 07/2012    Eye: right    Allergies: No Known Allergies (drug, envir, food or latex)    Latex Allergy: No    Current Medications: No current outpatient prescriptions on file.      Procedure Note:    proparacaine 0.5% gtt od  Lidocaine 2% Jelly od  Lid Speculum  Betadine 10% Swab od      Specimens:   No orders of the defined types were placed in this encounter.       Quadrant: Superotemporal  Central Retinal Artery perfusion: Yes  Complications: No  Sterile Saline Eye Rinse: Yes  Polytrim gtt 1: OD        Procedure Comments: Tolerated procedure well.  Instructions given to patient and discharged to home.  Patient verbalizes understanding.  Billing sheet with patient for check-out    Pain Scale Pre-Procedure: 0  Pain Scale Post-Procedure: 0

## 2011-12-08 ENCOUNTER — Ambulatory Visit: Payer: Self-pay | Admitting: Retina Ophthalmology

## 2011-12-08 ENCOUNTER — Emergency Department
Admission: EM | Admit: 2011-12-08 | Disposition: A | Discharge status: Home / Self Care | Payer: Self-pay | Source: Ambulatory Visit

## 2011-12-08 ENCOUNTER — Ambulatory Visit: Payer: Self-pay

## 2011-12-08 ENCOUNTER — Encounter: Payer: Self-pay | Admitting: Plastic Surgery

## 2011-12-08 MED ORDER — AZITHROMYCIN 250 MG PO TABS *I*
500.0000 mg | ORAL_TABLET | Freq: Once | ORAL | Status: AC
Start: 2011-12-08 — End: 2011-12-08
  Administered 2011-12-08: 500 mg via ORAL
  Filled 2011-12-08: qty 2

## 2011-12-08 MED ORDER — AZITHROMYCIN 250 MG PO TABS *I*
250.0000 mg | ORAL_TABLET | Freq: Every day | ORAL | Status: AC
Start: 2011-12-08 — End: 2011-12-12

## 2011-12-08 NOTE — ED Notes (Signed)
C/o productive cough x1 week

## 2011-12-08 NOTE — Progress Notes (Signed)
Subjective:   Subjective11/08/2011   No chief complaint on file.    HPI    DRCR-S Study visit:   RIGHT EYE:   Receiving anti-VEGF injections for PDR per above study protocol   anti-VEGF injection X 12 (10/1111)   LEFT EYE:   S/p PPV/EL 03/05/08           No current outpatient prescriptions on file.   Review of patient's allergies indicates no known allergies (drug, envir, food or latex).   No birth history on file.  No past medical history on file.   Past Surgical History   Procedure Laterality Date   . Study drug injection #2od 06/28/10/ polytrim       consent 06/02/10 and initial injection#1   . Study drug #3 07/29/10 consent 07/29/10 polytrim  start 06/02/10   . Study drug # 4 intravitreally 08/19/2010     . Study drug #5 od 09/16/10 polytrim     . Study drug #6 od 10/18/2010     . Study  drug #7 od 11/18/2010     . Study drug #8 od 06/02/11     . Study drug #9 od 07/07/2011     . Study drug #10 od 08/04/11 Right 08/04/11     Consent 08/04/11 polytrim   . Study drug #11 od Right 10/13/11   . Study drug #12 od Right 11/10/11     consent 08/04/2011  Polytrim      History   Smoking status   . Passive Smoke Exposure - Never Smoker   Smokeless tobacco   . Not on file      History   Alcohol Use: Not on file      History   Drug Use Not on file      Specialty Problems       Ophthalmology Problems    Proliferative diabetic retinopathy(362.02)                  Objective:   ObjectiveThere were no vitals filed for this visit.    Base Eye Exam       Visual Acuity    Right Left   Dist cc 20/16 20/25   Method: EVA-ETDRS   Correction: Glasses   Tonometry    Right Left   Pressure 14 15   Method: Tonopen   Time: 2:03 PM   Pupils    Pupils   Right PERRLA   Left PERRLA   Neuro/Psych   Oriented x3: Yes   Mood/Affect: Normal   Dilation   Both eyes: 2.5% Phenylephrine, 1.0% Tropicamide @ 2:03 PM      Slit Lamp and Fundus Exam       External Exam    Right Left   External Normal Normal   Slit Lamp Exam    Right Left   Lids/Lashes Normal Normal    Conjunctiva/Sclera White and quiet White and quiet   Cornea Clear Clear   Anterior Chamber Deep and quiet Deep and quiet   Iris Round and reactive Round and reactive   Lens 2+ Nuclear sclerosis Posterior chamber intraocular lens   Vitreous Normal Normal   Fundus Exam    Right Left   Disc Normal not dilated   C/D Ratio 0.3 0.3   Macula Normal Normal   Vessels NVE regressed nasal and inferior, no VH atten   Periphery 4 quad [facility] PRP      Refraction       Manifest Refraction    Sphere  Cylinder Axis   Right -0.25 +2.50 125   Left +0.75 +0.75 160    Comments: OD done today for study, OS done previously                  No annotated images are attached to the encounter.         Assessment/Plan:   AssessmentProliferative diabetic retinopathy(362.02)  Study patient  [facility] protocol S  See study chart.    RIGHT EYE:  Receiving anti-VEGF injections for PDR per above study protocol  S/p Ranibizumab x 12 (11/10/11)  Stable today  F/u 16m per protocol    LEFT EYE:   S/p PPV/EL 03/05/08--stable

## 2011-12-08 NOTE — First Provider Contact (Signed)
ED Medical Screening Exam Note    Initial provider evaluation performed by   ED First Provider Contact    Date/Time Event User Comments    12/08/11 1644 ED Provider First Contact Mackenzie Groom A Initial Face to Face Provider Contact          Pt with DM and HTN c/o a problem with coughing for better than 2 wks.   It is productive yellow mucus.   No improvement with time.   No fever but pt has chills.  No nausea or vomiting.    No improvement with cough over time.          XRAYS ordered.      Patient requires further evaluation.     Desia Saban, PA, 12/08/2011, 4:48 PM

## 2011-12-08 NOTE — ED Provider Notes (Signed)
History     Chief Complaint   Patient presents with   . Cough     HPI Comments:     Pt with DM and HTN c/o a problem with coughing for better than 2 wks.   It is productive yellow mucus.   No improvement with time.   No fever but pt has chills.  No nausea or vomiting.    No improvement with cough over time.    No CP.  He has been eating well and bg's have not been over 200.                 Past Medical History   Diagnosis Date   . Diabetes mellitus             Past Surgical History   Procedure Laterality Date   . Study drug injection #2od 06/28/10/ polytrim       consent 06/02/10 and initial injection#1   . Study drug #3 07/29/10 consent 07/29/10 polytrim  start 06/02/10   . Study drug # 4 intravitreally 08/19/2010     . Study drug #5 od 09/16/10 polytrim     . Study drug #6 od 10/18/2010     . Study  drug #7 od 11/18/2010     . Study drug #8 od 06/02/11     . Study drug #9 od 07/07/2011     . Study drug #10 od 08/04/11 Right 08/04/11     Consent 08/04/11 polytrim   . Study drug #11 od Right 10/13/11   . Study drug #12 od Right 11/10/11     consent 08/04/2011  Polytrim       History reviewed. No pertinent family history.      Social History      reports that he has been smoking.  He does not have any smokeless tobacco history on file. He reports that he does not drink alcohol. His drug and sexual activity histories not on file.    Living Situation    Questions Responses    Patient lives with     Homeless No    Caregiver for other family member     External Services     Employment     Domestic Violence Risk           Review of Systems   Review of Systems   Constitutional: Positive for chills. Negative for fever.   Respiratory: Positive for cough.    Cardiovascular: Negative for chest pain.   Neurological: Negative for syncope.       Physical Exam     ED Triage Vitals   BP Heart Rate Heart Rate(via Pulse Ox) Resp Temp Temp src SpO2 O2 Device O2 Flow Rate   12/08/11 1525 12/08/11 1525 -- 12/08/11 1525 12/08/11 1525 -- 12/08/11 1525  12/08/11 1525 --   118/74 mmHg 72  16 36 C (96.8 F)  100 % None (Room air)       Weight           12/08/11 1525           64.864 kg (143 lb)               Physical Exam   Constitutional: He is oriented to person, place, and time. He appears well-developed and well-nourished.   HENT:   Head: Normocephalic and atraumatic.   Eyes: No scleral icterus.   Neck: Normal range of motion. Neck supple.   Cardiovascular: Normal rate, regular rhythm and  normal heart sounds.    Pulmonary/Chest: Effort normal and breath sounds normal. He has no wheezes.   Very congested cough / no crackles noted      Abdominal: Soft. Bowel sounds are normal.   Neurological: He is alert and oriented to person, place, and time.   Skin: Skin is warm and dry.   Psychiatric: He has a normal mood and affect. His behavior is normal. Judgment and thought content normal.       Medical Decision Making      Amount and/or Complexity of Data Reviewed  Tests in the radiology section of CPT: ordered and reviewed        Initial Evaluation:  ED First Provider Contact    Date/Time Event User Comments    12/08/11 1644 ED Provider First Contact Giordana Weinheimer A Initial Face to Face Provider Contact          Patient seen by me as above    Assessment:  56 y.o., male comes to the ED with productive cough, not improving after 2 wks     Differential Diagnosis includes bronchitis, pneumonia, viral syndrome, occult tumor.               Plan: based on smoking history I will check a CXR.  Given the lack of cough improvement over time will plan to treat with a course of abx even if cxr unremarkable.   Pt is using appropriate otc agents for symptomatic treatment.     Supervising physician Dr Jerene Bears was immediately available.        [address] [place]    [place], [place], [place]  12/08/11 1656

## 2011-12-08 NOTE — Assessment & Plan Note (Signed)
Study patient  [facility] protocol S  See study chart.    RIGHT EYE:  Receiving anti-VEGF injections for PDR per above study protocol  S/p Ranibizumab x 12 (11/10/11)  Stable today  F/u 17m per protocol    LEFT EYE:   S/p PPV/EL 03/05/08--stable

## 2011-12-08 NOTE — Discharge Instructions (Signed)
Follow up with your doctor   zithromax as directed   Do not use over the counter cold medications that have any warning regarding hypertension.      Return to the emergency dept for problems or concerns of any kind

## 2011-12-08 NOTE — ED Notes (Signed)
C/o cough x 2 weeks, now with yellow phlegm.

## 2012-01-05 ENCOUNTER — Encounter: Payer: Self-pay | Admitting: Retina Ophthalmology

## 2012-01-12 ENCOUNTER — Ambulatory Visit: Payer: Self-pay

## 2012-01-12 ENCOUNTER — Ambulatory Visit: Payer: Self-pay | Admitting: Retina Ophthalmology

## 2012-01-12 NOTE — Progress Notes (Signed)
Subjective:   Subjective12/13/2013   No chief complaint on file.    HPI    DRCR-S Study visit:   RIGHT EYE:   Receiving anti-VEGF injections for PDR per above study protocol   S/P Ranibizumab injection X 12 (10/1111)   LEFT EYE:   S/p PPV/EL 03/05/08           Current Outpatient Rx   Name  Route  Sig  Dispense  Refill   . metFORMIN (GLUCOPHAGE) 1000 MG tablet    Oral    Take 1,000 mg by mouth 2 times daily (with meals)             . glipiZIDE (GLUCOTROL) 10 MG tablet    Oral    Take 10 mg by mouth daily (with breakfast)             . enalapril (VASOTEC) 10 MG tablet    Oral    Take 10 mg by mouth daily                Review of patient's allergies indicates no known allergies (drug, envir, food or latex).   No birth history on file.  Past Medical History   Diagnosis Date   . Diabetes mellitus       Past Surgical History   Procedure Laterality Date   . Study drug injection #2od 06/28/10/ polytrim       consent 06/02/10 and initial injection#1   . Study drug #3 07/29/10 consent 07/29/10 polytrim  start 06/02/10   . Study drug # 4 intravitreally 08/19/2010     . Study drug #5 od 09/16/10 polytrim     . Study drug #6 od 10/18/2010     . Study  drug #7 od 11/18/2010     . Study drug #8 od 06/02/11     . Study drug #9 od 07/07/2011     . Study drug #10 od 08/04/11 Right 08/04/11     Consent 08/04/11 polytrim   . Study drug #11 od Right 10/13/11   . Study drug #12 od Right 11/10/11     consent 08/04/2011  Polytrim      History   Smoking status   . Current Every Day Smoker -- 0.10 packs/day   Smokeless tobacco   . Not on file      History   Alcohol Use No      History   Drug Use Not on file      Specialty Problems       Ophthalmology Problems    Proliferative diabetic retinopathy(362.02)                  Objective:   ObjectiveThere were no vitals filed for this visit.    Base Eye Exam       Visual Acuity    Right Left   Dist cc 20/16 20/32   Method: EVA-ETDRS   Correction: Glasses   Tonometry    Right Left   Pressure 16 18   Method: Tonopen    Time: 10:55 AM   Pupils    Pupils   Right PERRLA   Left PERRLA   Neuro/Psych   Oriented x3: Yes   Mood/Affect: Normal   Dilation   Both eyes: 2.5% Phenylephrine, 1.0% Tropicamide @ 10:55 AM      Slit Lamp and Fundus Exam       External Exam    Right Left   External Normal Normal   Slit Lamp Exam  Right Left   Lids/Lashes Normal Normal   Conjunctiva/Sclera White and quiet White and quiet   Cornea Clear Clear   Anterior Chamber Deep and quiet Deep and quiet   Iris Round and reactive Round and reactive   Lens 2+ Nuclear sclerosis Posterior chamber intraocular lens   Vitreous Normal Normal   Fundus Exam    Right Left   Disc Normal not dilated   C/D Ratio 0.3 0.3   Macula Normal Normal   Vessels NVE regressed nasal and inferior, no VH atten   Periphery 4 quad [facility] PRP      Refraction       Manifest Refraction    Sphere Cylinder Axis Add Near   Right -0.50 +2.75 130 +2.50 J1+   Left +0.75 +0.75 160 +2.50 J1+    Comments: OD done today, OS done previously                  No annotated images are attached to the encounter.         Assessment/Plan:   AssessmentProliferative diabetic retinopathy(362.02)  RIGHT EYE:  Receiving anti-VEGF injections for PDR per above study protocol  S/p Ranibizumab x 12 (11/10/11)  Stable today  F/u 55m per protocol    LEFT EYE:   S/p PPV/EL 03/05/08--stable

## 2012-01-12 NOTE — Assessment & Plan Note (Signed)
RIGHT EYE:  Receiving anti-VEGF injections for PDR per above study protocol  S/p Ranibizumab x 12 (11/10/11)  Stable today  F/u 66m per protocol    LEFT EYE:   S/p PPV/EL 03/05/08--stable

## 2012-02-16 ENCOUNTER — Ambulatory Visit: Payer: Self-pay | Admitting: Retina Ophthalmology

## 2012-02-16 ENCOUNTER — Ambulatory Visit: Payer: Self-pay

## 2012-02-16 NOTE — Progress Notes (Signed)
Subjective:   Subjective1/17/2014   No chief complaint on file.    HPI    [facility] Study Visit:  RIGHT EYE:   Receiving anti-VEGF injections for PDR per above study protocol   S/P Ranibizumab injection X 12 (10/1111)   LEFT EYE:   S/p PPV/EL 03/05/08           Current Outpatient Rx   Name  Route  Sig  Dispense  Refill   . metFORMIN (GLUCOPHAGE) 1000 MG tablet    Oral    Take 1,000 mg by mouth 2 times daily (with meals)             . glipiZIDE (GLUCOTROL) 10 MG tablet    Oral    Take 10 mg by mouth daily (with breakfast)             . enalapril (VASOTEC) 10 MG tablet    Oral    Take 10 mg by mouth daily                Review of patient's allergies indicates no known allergies (drug, envir, food or latex).   No birth history on file.  Past Medical History   Diagnosis Date   . Diabetes mellitus       Past Surgical History   Procedure Laterality Date   . Study drug injection #2od 06/28/10/ polytrim       consent 06/02/10 and initial injection#1   . Study drug #3 07/29/10 consent 07/29/10 polytrim  start 06/02/10   . Study drug # 4 intravitreally 08/19/2010     . Study drug #5 od 09/16/10 polytrim     . Study drug #6 od 10/18/2010     . Study  drug #7 od 11/18/2010     . Study drug #8 od 06/02/11     . Study drug #9 od 07/07/2011     . Study drug #10 od 08/04/11 Right 08/04/11     Consent 08/04/11 polytrim   . Study drug #11 od Right 10/13/11   . Study drug #12 od Right 11/10/11     consent 08/04/2011  Polytrim      History   Smoking status   . Current Every Day Smoker -- 0.10 packs/day   Smokeless tobacco   . Not on file      History   Alcohol Use No      History   Drug Use Not on file      Specialty Problems       Ophthalmology Problems    Proliferative diabetic retinopathy(362.02)                  Objective:   ObjectiveThere were no vitals filed for this visit.    Base Eye Exam       Visual Acuity    Right Left   Dist cc 20/16 20/25   Method: ETDRS   Correction: Glasses   Tonometry    Right Left   Pressure 17 17   Method: Tonopen   Time: 10:35  AM   Pupils    Pupils React APD   Right PERRLA Minimal None   Left PERRLA Minimal None   Neuro/Psych   Oriented x3: Yes   Mood/Affect: Normal   Dilation   Both eyes: 2.5% Phenylephrine, 1.0% Tropicamide @ 10:35 AM      Slit Lamp and Fundus Exam       External Exam    Right Left   External Normal Normal  Slit Lamp Exam    Right Left   Lids/Lashes Normal Normal   Conjunctiva/Sclera White and quiet White and quiet   Cornea Clear Clear   Anterior Chamber Deep and quiet Deep and quiet   Iris Round and reactive Round and reactive   Lens 2+ Nuclear sclerosis Posterior chamber intraocular lens   Vitreous Normal Normal   Fundus Exam    Right Left   Disc Normal not dilated   C/D Ratio 0.3 0.3   Macula Normal Normal   Vessels NVE regressed nasal and inferior, no VH atten   Periphery 4 quad [facility] PRP                  No annotated images are attached to the encounter.         Assessment/Plan:   AssessmentProliferative diabetic retinopathy(362.02)  Study patient  [facility] protocol S  See study chart.    RIGHT EYE:  Receiving anti-VEGF injections for PDR per above study protocol  S/p Ranibizumab x 12 (11/10/11)  Stable today  F/u per protocol    LEFT EYE:   S/p PPV/EL 03/05/08--stable

## 2012-02-16 NOTE — Assessment & Plan Note (Signed)
Study patient  DRCR protocol S  See study chart.    RIGHT EYE:  Receiving anti-VEGF injections for PDR per above study protocol  S/p Ranibizumab x 12 (11/10/11)  Stable today  F/u per protocol    LEFT EYE:   S/p PPV/EL 03/05/08--stable

## 2012-04-12 ENCOUNTER — Ambulatory Visit: Payer: Self-pay

## 2012-04-12 ENCOUNTER — Ambulatory Visit: Payer: Self-pay | Admitting: Retina Ophthalmology

## 2012-04-12 NOTE — Assessment & Plan Note (Signed)
Study patient  DRCR protocol S  See study chart.    RIGHT EYE:  Receiving anti-VEGF injections for PDR per above study protocol  S/p Ranibizumab x 12 (11/10/11)  Stable today  F/u per protocol    LEFT EYE:   S/p PPV/EL 03/05/08--stable

## 2012-04-12 NOTE — Progress Notes (Signed)
Subjective:   Subjective3/14/2014   No chief complaint on file.    HPI    DRCR-S Study Visit:   RIGHT EYE:   Receiving anti-VEGF injections for PDR per above study protocol   S/P Ranibizumab injection X 12 (10/1111)   LEFT EYE:   S/p PPV/EL 03/05/08        Current Outpatient Rx   Name  Route  Sig  Dispense  Refill   . metFORMIN (GLUCOPHAGE) 1000 MG tablet    Oral    Take 1,000 mg by mouth 2 times daily (with meals)             . glipiZIDE (GLUCOTROL) 10 MG tablet    Oral    Take 10 mg by mouth daily (with breakfast)             . enalapril (VASOTEC) 10 MG tablet    Oral    Take 10 mg by mouth daily                Review of patient's allergies indicates no known allergies (drug, envir, food or latex).   No birth history on file.  Past Medical History   Diagnosis Date   . Diabetes mellitus       Past Surgical History   Procedure Laterality Date   . Study drug injection #2od 06/28/10/ polytrim       consent 06/02/10 and initial injection#1   . Study drug #3 07/29/10 consent 07/29/10 polytrim  start 06/02/10   . Study drug # 4 intravitreally 08/19/2010     . Study drug #5 od 09/16/10 polytrim     . Study drug #6 od 10/18/2010     . Study  drug #7 od 11/18/2010     . Study drug #8 od 06/02/11     . Study drug #9 od 07/07/2011     . Study drug #10 od 08/04/11 Right 08/04/11     Consent 08/04/11 polytrim   . Study drug #11 od Right 10/13/11   . Study drug #12 od Right 11/10/11     consent 08/04/2011  Polytrim      History   Smoking status   . Current Every Day Smoker -- 0.10 packs/day   Smokeless tobacco   . Not on file      History   Alcohol Use No      History   Drug Use Not on file      Specialty Problems       Ophthalmology Problems    Proliferative diabetic retinopathy(362.02)                  Objective:   ObjectiveThere were no vitals filed for this visit.    Base Eye Exam       Visual Acuity    Right Left   Dist cc 20/16 20/32   Method: EVA-ETDRS   Correction: Glasses   Tonometry    Right Left   Pressure 19 18   Method: Tonopen   Time:  10:40 AM   Pupils    Pupils   Right PERRLA   Left PERRLA   Neuro/Psych   Oriented x3: Yes   Mood/Affect: Normal   Dilation   Both eyes: 2.5% Phenylephrine, 1.0% Tropicamide @ 10:40 AM      Slit Lamp and Fundus Exam       External Exam    Right Left   External Normal Normal   Slit Lamp Exam  Right Left   Lids/Lashes Normal Normal   Conjunctiva/Sclera White and quiet White and quiet   Cornea Clear Clear   Anterior Chamber Deep and quiet Deep and quiet   Iris Round and reactive Round and reactive   Lens 2+ Nuclear sclerosis Posterior chamber intraocular lens   Vitreous Normal Normal   Fundus Exam    Right Left   Disc Normal not dilated   C/D Ratio 0.3 0.3   Macula Normal Normal   Vessels NVE regressed nasal and inferior, no VH atten   Periphery 4 quad [facility] PRP      Refraction       Manifest Refraction    Sphere Cylinder Axis   Right -0.25 +2.50 130   Left +0.75 +0.75 160    Comments: OD done today, OS done previously                  No annotated images are attached to the encounter.         Assessment/Plan:   AssessmentProliferative diabetic retinopathy(362.02)  Study patient  [facility] protocol S  See study chart.    RIGHT EYE:  Receiving anti-VEGF injections for PDR per above study protocol  S/p Ranibizumab x 12 (11/10/11)  Stable today  F/u per protocol    LEFT EYE:   S/p PPV/EL 03/05/08--stable

## 2012-05-17 ENCOUNTER — Ambulatory Visit: Payer: Self-pay

## 2012-05-17 ENCOUNTER — Ambulatory Visit: Payer: Self-pay | Admitting: Retina Ophthalmology

## 2012-05-17 NOTE — Progress Notes (Signed)
Subjective:   Subjective4/18/2014   No chief complaint on file.    HPI    DRCR-S Study Visit:   RIGHT EYE:   Receiving anti-VEGF injections for PDR per above study protocol   S/P Ranibizumab injection X 12 (10/1111)   LEFT EYE:   S/p PPV/EL 03/05/08           Current Outpatient Rx   Name  Route  Sig  Dispense  Refill   . metFORMIN (GLUCOPHAGE) 1000 MG tablet    Oral    Take 1,000 mg by mouth 2 times daily (with meals)             . glipiZIDE (GLUCOTROL) 10 MG tablet    Oral    Take 10 mg by mouth daily (with breakfast)             . enalapril (VASOTEC) 10 MG tablet    Oral    Take 10 mg by mouth daily                Review of patient's allergies indicates no known allergies (drug, envir, food or latex).   No birth history on file.  Past Medical History   Diagnosis Date   . Diabetes mellitus       Past Surgical History   Procedure Laterality Date   . Study drug injection #2od 06/28/10/ polytrim       consent 06/02/10 and initial injection#1   . Study drug #3 07/29/10 consent 07/29/10 polytrim  start 06/02/10   . Study drug # 4 intravitreally 08/19/2010     . Study drug #5 od 09/16/10 polytrim     . Study drug #6 od 10/18/2010     . Study  drug #7 od 11/18/2010     . Study drug #8 od 06/02/11     . Study drug #9 od 07/07/2011     . Study drug #10 od 08/04/11 Right 08/04/11     Consent 08/04/11 polytrim   . Study drug #11 od Right 10/13/11   . Study drug #12 od Right 11/10/11     consent 08/04/2011  Polytrim      History   Smoking status   . Current Every Day Smoker -- 0.10 packs/day   Smokeless tobacco   . Not on file      History   Alcohol Use No      History   Drug Use Not on file      Specialty Problems      ICD-9-CM       Ophthalmology Problems    Proliferative diabetic retinopathy(362.02)                  Objective:   ObjectiveThere were no vitals filed for this visit.    Base Eye Exam       Visual Acuity    Right Left Both   Dist sc   20/20   Dist cc 20/20 20/32    Method: ETDRS   Correction: Glasses   Tonometry    Right Left    Pressure 18 21   Method: Tonopen   Time: 11:00 AM   Neuro/Psych   Oriented x3: Yes   Mood/Affect: Normal      Slit Lamp and Fundus Exam       External Exam    Right Left   External Normal Normal   Slit Lamp Exam    Right Left   Lids/Lashes Normal Normal   Conjunctiva/Sclera White  and quiet White and quiet   Cornea Clear Clear   Anterior Chamber Deep and quiet Deep and quiet   Iris Round and reactive Round and reactive   Lens 2+ Nuclear sclerosis Posterior chamber intraocular lens   Vitreous Normal Normal   Fundus Exam    Right Left   Disc Normal not dilated   C/D Ratio 0.3 0.3   Macula Normal Normal   Vessels NVE regressed nasal and inferior, no VH atten   Periphery 4 quad [facility] PRP      Refraction       Manifest Refraction    Sphere Cylinder Axis   Right -0.75 +2.50 130   Left pl +1.00 165    Comments: MR done OU today                  No annotated images are attached to the encounter.         Assessment/Plan:   AssessmentNo problem-specific assessment & plan notes found for this encounter.

## 2012-05-30 HISTORY — PX: LOOP RECORDER IMPLANT: SHX5954

## 2012-08-09 ENCOUNTER — Encounter: Payer: Self-pay | Admitting: Retina Ophthalmology

## 2012-08-16 ENCOUNTER — Ambulatory Visit: Payer: Self-pay | Admitting: Retina Ophthalmology

## 2012-08-16 ENCOUNTER — Ambulatory Visit: Payer: Self-pay

## 2012-08-16 NOTE — Progress Notes (Signed)
Subjective:   Subjective7/18/2014   No chief complaint on file.    HPI    DRCR-S Study Visit:   RIGHT EYE:   Receiving anti-VEGF injections for PDR per above study protocol   S/P Ranibizumab injection X 12 (10/1111)   LEFT EYE:   S/p PPV/EL 03/05/08           Current Outpatient Rx   Name  Route  Sig  Dispense  Refill   . metFORMIN (GLUCOPHAGE) 1000 MG tablet    Oral    Take 1,000 mg by mouth 2 times daily (with meals)             . glipiZIDE (GLUCOTROL) 10 MG tablet    Oral    Take 10 mg by mouth daily (with breakfast)             . enalapril (VASOTEC) 10 MG tablet    Oral    Take 10 mg by mouth daily                Review of patient's allergies indicates no known allergies (drug, envir, food or latex).   No birth history on file.  Past Medical History   Diagnosis Date   . Diabetes mellitus       Past Surgical History   Procedure Laterality Date   . Study drug injection #2od 06/28/10/ polytrim       consent 06/02/10 and initial injection#1   . Study drug #3 07/29/10 consent 07/29/10 polytrim  start 06/02/10   . Study drug # 4 intravitreally 08/19/2010     . Study drug #5 od 09/16/10 polytrim     . Study drug #6 od 10/18/2010     . Study  drug #7 od 11/18/2010     . Study drug #8 od 06/02/11     . Study drug #9 od 07/07/2011     . Study drug #10 od 08/04/11 Right 08/04/11     Consent 08/04/11 polytrim   . Study drug #11 od Right 10/13/11   . Study drug #12 od Right 11/10/11     consent 08/04/2011  Polytrim      History   Smoking status   . Current Every Day Smoker -- 0.10 packs/day   Smokeless tobacco   . Not on file      History   Alcohol Use No      History   Drug Use Not on file      Specialty Problems       Ophthalmology Problems    Proliferative diabetic retinopathy(362.02)                  Objective:   ObjectiveThere were no vitals filed for this visit.    Base Eye Exam       Visual Acuity    Right Left   Dist cc 20/16 20/20   Method: EVA-ETDRS   Correction: Glasses   Tonometry    Right Left   Pressure 15 15   Method: Tonopen    Time: 10:30 AM   Pupils    APD   Right None   Left None   Neuro/Psych   Oriented x3: Yes   Mood/Affect: Normal   Dilation   Both eyes: 2.5% Phenylephrine, 1.0% Tropicamide @ 10:30 AM      Slit Lamp and Fundus Exam       External Exam    Right Left   External Normal Normal   Slit Lamp Exam  Right Left   Lids/Lashes Normal Normal   Conjunctiva/Sclera White and quiet White and quiet   Cornea Clear Clear   Anterior Chamber Deep and quiet Deep and quiet   Iris Round and reactive Round and reactive   Lens 2+ Nuclear sclerosis Posterior chamber intraocular lens   Vitreous Normal Normal   Fundus Exam    Right Left   Disc Normal not dilated   C/D Ratio 0.3 0.3   Macula Normal Normal   Vessels NVE regressed nasal and inferior, no VH atten   Periphery 4 quad [facility] PRP      Refraction       Manifest Refraction    Sphere Cylinder Axis   Right -0.75 +2.50 130   Left pl +1.00 165    Comments: OD done today, OS done previously                  No annotated images are attached to the encounter.         Assessment/Plan:   AssessmentProliferative diabetic retinopathy(362.02)  Study patient  [facility] protocol S  See study chart.    RIGHT EYE:  Receiving anti-VEGF injections for PDR per above study protocol  S/p Ranibizumab x 12 (11/10/11)  Stable today  F/u per protocol    LEFT EYE:   S/p PPV/EL 03/05/08--stable

## 2012-08-16 NOTE — Assessment & Plan Note (Signed)
Study patient  DRCR protocol S  See study chart.    RIGHT EYE:  Receiving anti-VEGF injections for PDR per above study protocol  S/p Ranibizumab x 12 (11/10/11)  Stable today  F/u per protocol    LEFT EYE:   S/p PPV/EL 03/05/08--stable

## 2012-09-13 ENCOUNTER — Encounter: Payer: Self-pay | Admitting: Retina Ophthalmology

## 2012-10-15 ENCOUNTER — Ambulatory Visit: Payer: Self-pay

## 2012-10-15 ENCOUNTER — Ambulatory Visit: Payer: Self-pay | Admitting: Retina Ophthalmology

## 2012-10-15 NOTE — Assessment & Plan Note (Signed)
Study patient  [facility] protocol S  See study chart.    RIGHT EYE:  Receiving anti-VEGF injections for PDR per above study protocol  S/p Ranibizumab x 12 (11/10/11)

## 2012-10-15 NOTE — Progress Notes (Signed)
Subjective:   Subjective9/16/2014   No chief complaint on file.    HPI    DRCR-S Study Visit:   RIGHT EYE:   Receiving anti-VEGF injections for PDR per above study protocol   S/P Ranibizumab injection X 12 (10/1111)   LEFT EYE:   S/p PPV/EL 03/05/08           Current Outpatient Rx   Name  Route  Sig  Dispense  Refill   . metFORMIN (GLUCOPHAGE) 1000 MG tablet    Oral    Take 1,000 mg by mouth 2 times daily (with meals)             . glipiZIDE (GLUCOTROL) 10 MG tablet    Oral    Take 10 mg by mouth daily (with breakfast)             . enalapril (VASOTEC) 10 MG tablet    Oral    Take 10 mg by mouth daily                Review of patient's allergies indicates no known allergies (drug, envir, food or latex).   No birth history on file.  Past Medical History   Diagnosis Date   . Diabetes mellitus       Past Surgical History   Procedure Laterality Date   . Study drug injection #2od 06/28/10/ polytrim       consent 06/02/10 and initial injection#1   . Study drug #3 07/29/10 consent 07/29/10 polytrim  start 06/02/10   . Study drug # 4 intravitreally 08/19/2010     . Study drug #5 od 09/16/10 polytrim     . Study drug #6 od 10/18/2010     . Study  drug #7 od 11/18/2010     . Study drug #8 od 06/02/11     . Study drug #9 od 07/07/2011     . Study drug #10 od 08/04/11 Right 08/04/11     Consent 08/04/11 polytrim   . Study drug #11 od Right 10/13/11   . Study drug #12 od Right 11/10/11     consent 08/04/2011  Polytrim      History   Smoking status   . Current Every Day Smoker -- 0.10 packs/day   Smokeless tobacco   . Not on file      History   Alcohol Use No      History   Drug Use Not on file      Specialty Problems       Ophthalmology Problems    Proliferative diabetic retinopathy(362.02)                  Objective:   ObjectiveThere were no vitals filed for this visit.    Base Eye Exam       Visual Acuity    Right Left   Dist cc 20/16 20/20   Method: ETDRS   Correction: Glasses   Tonometry    Right Left   Pressure 15 18   Method: Tonopen   Time:  2:45 PM   Pupils    Pupils   Right PERRLA   Left PERRLA   Neuro/Psych   Oriented x3: Yes   Mood/Affect: Normal   Dilation   Both eyes: 1.0% Mydriacyl @ 2:45 PM      Slit Lamp and Fundus Exam       External Exam    Right Left   External Normal Normal   Slit Lamp Exam  Right Left   Lids/Lashes Normal Normal   Conjunctiva/Sclera White and quiet White and quiet   Cornea Clear Clear   Anterior Chamber Deep and quiet Deep and quiet   Iris Round and reactive Round and reactive   Lens 2+ Nuclear sclerosis Posterior chamber intraocular lens   Vitreous Normal Normal   Fundus Exam    Right Left   Disc Normal not dilated   C/D Ratio 0.3 0.3   Macula Normal Normal   Vessels NVE regressed nasal and inferior, no VH atten   Periphery 4 quad [facility] PRP      Refraction       Manifest Refraction    Sphere Cylinder Axis   Right -0.75 +2.25 130   Left pl +1.00 130                  No annotated images are attached to the encounter.         Assessment/Plan:   AssessmentProliferative diabetic retinopathy(362.02)  Study patient  [facility] protocol S  See study chart.    RIGHT EYE:  Receiving anti-VEGF injections for PDR per above study protocol  S/p Ranibizumab x 12 (11/10/11)

## 2013-01-14 ENCOUNTER — Ambulatory Visit: Payer: Self-pay | Admitting: Retina Ophthalmology

## 2013-01-14 NOTE — Progress Notes (Signed)
Subjective:   Subjective12/16/2014   No chief complaint on file.    HPI    DRCR-S Study Visit:   RIGHT EYE:   Receiving anti-VEGF injections for PDR per above study protocol   S/P Ranibizumab injection X 12 (10/1111)   LEFT EYE:   S/p PPV/EL 03/05/08           Current Outpatient Rx   Name  Route  Sig  Dispense  Refill   . metFORMIN (GLUCOPHAGE) 1000 MG tablet    Oral    Take 1,000 mg by mouth 2 times daily (with meals)             . glipiZIDE (GLUCOTROL) 10 MG tablet    Oral    Take 10 mg by mouth daily (with breakfast)             . enalapril (VASOTEC) 10 MG tablet    Oral    Take 10 mg by mouth daily                Review of patient's allergies indicates no known allergies (drug, envir, food or latex).   No birth history on file.  Past Medical History   Diagnosis Date   . Diabetes mellitus       Past Surgical History   Procedure Laterality Date   . Study drug injection #2od 06/28/10/ polytrim       consent 06/02/10 and initial injection#1   . Study drug #3 07/29/10 consent 07/29/10 polytrim  start 06/02/10   . Study drug # 4 intravitreally 08/19/2010     . Study drug #5 od 09/16/10 polytrim     . Study drug #6 od 10/18/2010     . Study  drug #7 od 11/18/2010     . Study drug #8 od 06/02/11     . Study drug #9 od 07/07/2011     . Study drug #10 od 08/04/11 Right 08/04/11     Consent 08/04/11 polytrim   . Study drug #11 od Right 10/13/11   . Study drug #12 od Right 11/10/11     consent 08/04/2011  Polytrim      History   Smoking status   . Current Every Day Smoker -- 0.10 packs/day   Smokeless tobacco   . Not on file      History   Alcohol Use No      History   Drug Use Not on file      Specialty Problems       Ophthalmology Problems    Proliferative diabetic retinopathy(362.02)                  Objective:   ObjectiveThere were no vitals filed for this visit.    Base Eye Exam       Visual Acuity    Right Left   Dist cc 20/20 20/32   Method: Snellen - Linear   Tonometry    Right Left   Pressure 11 11   Method: Tonopen   Time: 9:05 AM    Neuro/Psych   Oriented x3: Yes   Mood/Affect: Normal   Dilation   Both eyes: 2.5% Phenylephrine, 1.0% Tropicamide @ 9:05 AM      Slit Lamp and Fundus Exam       External Exam    Right Left   External Normal Normal   Slit Lamp Exam    Right Left   Lids/Lashes Normal Normal   Conjunctiva/Sclera White and quiet White  and quiet   Cornea Clear Clear   Anterior Chamber Deep and quiet Deep and quiet   Iris Round and reactive Round and reactive   Lens 2+ Nuclear sclerosis Posterior chamber intraocular lens   Vitreous Normal Normal   Fundus Exam    Right Left   Disc Normal not dilated   C/D Ratio 0.3 0.3   Macula Normal Normal   Vessels NVE regressed nasal and inferior, no VH atten   Periphery 4 quad [facility] PRP      Refraction       Wearing Rx    Sphere Cylinder Axis   Right -1.00 +1.50 110   Left [place] +1.00 165   Manifest Refraction    Sphere Cylinder Axis Dist   Right -1.00 +1.00 110 20/20   Left                      No annotated images are attached to the encounter.         Assessment/Plan:   AssessmentProliferative diabetic retinopathy(362.02)  Study patient  [facility] protocol S  See study chart.    RIGHT EYE:  Receiving anti-VEGF injections for PDR per above study protocol  S/p Ranibizumab x 12 (11/10/11)  Stable today  F/u per protocol    LEFT EYE:   S/p PPV/EL 03/05/08--stable

## 2013-01-14 NOTE — Assessment & Plan Note (Signed)
Study patient  DRCR protocol S  See study chart.    RIGHT EYE:  Receiving anti-VEGF injections for PDR per above study protocol  S/p Ranibizumab x 12 (11/10/11)  Stable today  F/u per protocol    LEFT EYE:   S/p PPV/EL 03/05/08--stable

## 2013-05-06 ENCOUNTER — Ambulatory Visit: Payer: Self-pay | Admitting: Retina Ophthalmology

## 2013-05-06 ENCOUNTER — Ambulatory Visit: Payer: Self-pay

## 2013-05-06 NOTE — Assessment & Plan Note (Signed)
[facility]  [facility]    RIGHT EYE:  Receiving anti-VEGF injections for PDR per above study protocol  S/p Ranibizumab x 12 (11/10/11)  Stable today  F/u per protocol    LEFT EYE:   S/p PPV/EL 03/05/08--stable

## 2013-05-06 NOTE — Progress Notes (Signed)
Subjective:   Subjective4/07/2013   Chief Complaint   Patient presents with    Research     DRCR - S      HPI    Research  Additional comments: DRCR - S          Comments DRCR-S Study Visit:   RIGHT EYE:   Receiving anti-VEGF injections for PDR per above study protocol   S/P Ranibizumab injection X 12 (10/1111)   LEFT EYE:   S/p PPV/EL 03/05/08          Current Outpatient Rx   Name  Route  Sig  Dispense  Refill    metFORMIN (GLUCOPHAGE) 1000 MG tablet    Oral    Take 1,000 mg by mouth 2 times daily (with meals)              glipiZIDE (GLUCOTROL) 10 MG tablet    Oral    Take 10 mg by mouth daily (with breakfast)              enalapril (VASOTEC) 10 MG tablet    Oral    Take 10 mg by mouth daily                Review of patient's allergies indicates no known allergies (drug, envir, food or latex).   No birth history on file.  Past Medical History   Diagnosis Date    Diabetes mellitus       Past Surgical History   Procedure Laterality Date    Study drug injection #2od 06/28/10/ polytrim       consent 06/02/10 and initial injection#1    Study drug #3 07/29/10 consent 07/29/10 polytrim  start 06/02/10    Study drug # 4 intravitreally 08/19/2010      Study drug #5 od 09/16/10 polytrim      Study drug #6 od 10/18/2010      Study  drug #7 od 11/18/2010      Study drug #8 od 06/02/11      Study drug #9 od 07/07/2011      Study drug #10 od 08/04/11 Right 08/04/11     Consent 08/04/11 polytrim    Study drug #11 od Right 10/13/11    Study drug #12 od Right 11/10/11     consent 08/04/2011  Polytrim      History   Smoking status    Current Every Day Smoker -- 0.10 packs/day   Smokeless tobacco    Not on file      History   Alcohol Use No      History   Drug Use Not on file      Specialty Problems       Ophthalmology Problems     Proliferative diabetic retinopathy(362.02)                  Objective:   ObjectiveThere were no vitals filed for this visit.    Base Eye Exam    Visual Acuity (ETDRS)     Right Left   Dist cc 20/16 20/25        Correction:  Glasses    20/16 OU sc      Tonometry (Tonopen, 12:41 PM)     Right Left   Pressure 12 20         Neuro/Psych    Oriented x3:  Yes    Mood/Affect:  Normal      Dilation    Both eyes:  2.5% Phenylephrine, 1.0% Tropicamide @  11:30 PM            Slit Lamp and Fundus Exam    External Exam     Right Left    External Normal Normal      Slit Lamp Exam     Right Left    Lids/Lashes Normal Normal    Conjunctiva/Sclera White and quiet White and quiet    Cornea Clear Clear    Anterior Chamber Deep and quiet Deep and quiet    Iris Round and reactive Round and reactive    Lens 2+ Nuclear sclerosis Posterior chamber intraocular lens    Vitreous Normal clear s/p PPV      Fundus Exam     Right Left    Disc Normal mild pallor    C/D Ratio 0.3 0.3    Macula Normal Normal    Vessels NVE regressed nasal and inferior, no VH atten    Periphery 4 quad SIRH PRP      Edited by: Blain Paisiloreto, Aylene Acoff, MD            Refraction    Manifest Refraction     Sphere Cylinder Axis Dist   Right -1.00 +1.50 108 20/16   Left -0.25 +0.50 165 20/25                           No annotated images are attached to the encounter.         Assessment/Plan:   AssessmentProliferative diabetic retinopathy(362.02)  [facility]  See study chart.    RIGHT EYE:  Receiving anti-VEGF injections for PDR per above study protocol  S/p Ranibizumab x 12 (11/10/11)  Stable today  F/u per protocol    LEFT EYE:   S/p PPV/EL 03/05/08--stable

## 2013-06-30 DIAGNOSIS — I639 Cerebral infarction, unspecified: Secondary | ICD-10-CM

## 2013-06-30 HISTORY — DX: Cerebral infarction, unspecified: I63.9

## 2013-08-19 ENCOUNTER — Encounter: Payer: Self-pay | Admitting: Retina Ophthalmology

## 2013-09-03 ENCOUNTER — Ambulatory Visit: Payer: Self-pay | Admitting: Physical Therapy

## 2013-09-05 ENCOUNTER — Ambulatory Visit: Payer: Medicare Other | Attending: Internal Medicine

## 2013-09-05 DIAGNOSIS — IMO0001 Reserved for inherently not codable concepts without codable children: Secondary | ICD-10-CM | POA: Insufficient documentation

## 2013-09-05 DIAGNOSIS — R262 Difficulty in walking, not elsewhere classified: Secondary | ICD-10-CM | POA: Insufficient documentation

## 2013-09-05 DIAGNOSIS — R279 Unspecified lack of coordination: Secondary | ICD-10-CM | POA: Insufficient documentation

## 2013-09-08 ENCOUNTER — Encounter (HOSPITAL_COMMUNITY): Payer: Self-pay | Admitting: Emergency Medicine

## 2013-09-08 ENCOUNTER — Inpatient Hospital Stay (HOSPITAL_COMMUNITY)
Admission: EM | Admit: 2013-09-08 | Discharge: 2013-09-12 | DRG: 628 | Disposition: A | Discharge status: Home / Self Care | Payer: Medicare Other | Attending: Internal Medicine | Admitting: Internal Medicine

## 2013-09-08 ENCOUNTER — Ambulatory Visit: Payer: Medicare Other | Admitting: Occupational Therapy

## 2013-09-08 ENCOUNTER — Ambulatory Visit: Payer: Medicare Other

## 2013-09-08 DIAGNOSIS — D631 Anemia in chronic kidney disease: Secondary | ICD-10-CM | POA: Diagnosis present

## 2013-09-08 DIAGNOSIS — R29898 Other symptoms and signs involving the musculoskeletal system: Secondary | ICD-10-CM | POA: Diagnosis present

## 2013-09-08 DIAGNOSIS — E1310 Other specified diabetes mellitus with ketoacidosis without coma: Principal | ICD-10-CM | POA: Diagnosis present

## 2013-09-08 DIAGNOSIS — Z91018 Allergy to other foods: Secondary | ICD-10-CM

## 2013-09-08 DIAGNOSIS — R739 Hyperglycemia, unspecified: Secondary | ICD-10-CM

## 2013-09-08 DIAGNOSIS — E1110 Type 2 diabetes mellitus with ketoacidosis without coma: Secondary | ICD-10-CM | POA: Diagnosis present

## 2013-09-08 DIAGNOSIS — R7309 Other abnormal glucose: Secondary | ICD-10-CM

## 2013-09-08 DIAGNOSIS — E871 Hypo-osmolality and hyponatremia: Secondary | ICD-10-CM | POA: Diagnosis present

## 2013-09-08 DIAGNOSIS — I120 Hypertensive chronic kidney disease with stage 5 chronic kidney disease or end stage renal disease: Secondary | ICD-10-CM | POA: Diagnosis present

## 2013-09-08 DIAGNOSIS — F17200 Nicotine dependence, unspecified, uncomplicated: Secondary | ICD-10-CM | POA: Diagnosis present

## 2013-09-08 DIAGNOSIS — I69959 Hemiplegia and hemiparesis following unspecified cerebrovascular disease affecting unspecified side: Secondary | ICD-10-CM | POA: Diagnosis not present

## 2013-09-08 DIAGNOSIS — R358 Other polyuria: Secondary | ICD-10-CM | POA: Diagnosis present

## 2013-09-08 DIAGNOSIS — B1920 Unspecified viral hepatitis C without hepatic coma: Secondary | ICD-10-CM | POA: Diagnosis present

## 2013-09-08 DIAGNOSIS — N185 Chronic kidney disease, stage 5: Secondary | ICD-10-CM | POA: Diagnosis present

## 2013-09-08 DIAGNOSIS — R64 Cachexia: Secondary | ICD-10-CM | POA: Diagnosis present

## 2013-09-08 DIAGNOSIS — E7800 Pure hypercholesterolemia, unspecified: Secondary | ICD-10-CM | POA: Diagnosis present

## 2013-09-08 DIAGNOSIS — E43 Unspecified severe protein-calorie malnutrition: Secondary | ICD-10-CM | POA: Diagnosis present

## 2013-09-08 DIAGNOSIS — Z7982 Long term (current) use of aspirin: Secondary | ICD-10-CM | POA: Diagnosis not present

## 2013-09-08 DIAGNOSIS — Z8673 Personal history of transient ischemic attack (TIA), and cerebral infarction without residual deficits: Secondary | ICD-10-CM

## 2013-09-08 DIAGNOSIS — M949 Disorder of cartilage, unspecified: Secondary | ICD-10-CM

## 2013-09-08 DIAGNOSIS — M6250 Muscle wasting and atrophy, not elsewhere classified, unspecified site: Secondary | ICD-10-CM | POA: Diagnosis present

## 2013-09-08 DIAGNOSIS — N039 Chronic nephritic syndrome with unspecified morphologic changes: Secondary | ICD-10-CM

## 2013-09-08 DIAGNOSIS — N186 End stage renal disease: Secondary | ICD-10-CM

## 2013-09-08 DIAGNOSIS — I509 Heart failure, unspecified: Secondary | ICD-10-CM | POA: Diagnosis present

## 2013-09-08 DIAGNOSIS — I10 Essential (primary) hypertension: Secondary | ICD-10-CM | POA: Diagnosis present

## 2013-09-08 DIAGNOSIS — E0810 Diabetes mellitus due to underlying condition with ketoacidosis without coma: Secondary | ICD-10-CM

## 2013-09-08 DIAGNOSIS — Z681 Body mass index (BMI) 19 or less, adult: Secondary | ICD-10-CM

## 2013-09-08 DIAGNOSIS — M899 Disorder of bone, unspecified: Secondary | ICD-10-CM | POA: Diagnosis present

## 2013-09-08 DIAGNOSIS — Z794 Long term (current) use of insulin: Secondary | ICD-10-CM | POA: Diagnosis not present

## 2013-09-08 DIAGNOSIS — IMO0001 Reserved for inherently not codable concepts without codable children: Secondary | ICD-10-CM | POA: Diagnosis present

## 2013-09-08 DIAGNOSIS — R569 Unspecified convulsions: Secondary | ICD-10-CM | POA: Diagnosis present

## 2013-09-08 DIAGNOSIS — E1165 Type 2 diabetes mellitus with hyperglycemia: Secondary | ICD-10-CM | POA: Diagnosis present

## 2013-09-08 DIAGNOSIS — R262 Difficulty in walking, not elsewhere classified: Secondary | ICD-10-CM | POA: Diagnosis not present

## 2013-09-08 DIAGNOSIS — I639 Cerebral infarction, unspecified: Secondary | ICD-10-CM

## 2013-09-08 DIAGNOSIS — E1169 Type 2 diabetes mellitus with other specified complication: Secondary | ICD-10-CM

## 2013-09-08 DIAGNOSIS — Z992 Dependence on renal dialysis: Secondary | ICD-10-CM

## 2013-09-08 DIAGNOSIS — I5032 Chronic diastolic (congestive) heart failure: Secondary | ICD-10-CM | POA: Diagnosis present

## 2013-09-08 DIAGNOSIS — R3589 Other polyuria: Secondary | ICD-10-CM | POA: Diagnosis present

## 2013-09-08 DIAGNOSIS — R279 Unspecified lack of coordination: Secondary | ICD-10-CM | POA: Diagnosis not present

## 2013-09-08 HISTORY — DX: Cerebral infarction, unspecified: I63.9

## 2013-09-08 HISTORY — DX: Unspecified convulsions: R56.9

## 2013-09-08 HISTORY — DX: Essential (primary) hypertension: I10

## 2013-09-08 HISTORY — DX: Heart failure, unspecified: I50.9

## 2013-09-08 LAB — COMPREHENSIVE METABOLIC PANEL
ALK PHOS: 126 U/L — AB (ref 39–117)
ALT: 49 U/L (ref 0–53)
AST: 58 U/L — AB (ref 0–37)
Albumin: 3.2 g/dL — ABNORMAL LOW (ref 3.5–5.2)
Anion gap: 16 — ABNORMAL HIGH (ref 5–15)
BUN: 85 mg/dL — ABNORMAL HIGH (ref 6–23)
CO2: 19 meq/L (ref 19–32)
Calcium: 8.4 mg/dL (ref 8.4–10.5)
Chloride: 94 mEq/L — ABNORMAL LOW (ref 96–112)
Creatinine, Ser: 5.43 mg/dL — ABNORMAL HIGH (ref 0.50–1.35)
GFR calc non Af Amer: 11 mL/min — ABNORMAL LOW (ref >90)
GFR, EST AFRICAN AMERICAN: 12 mL/min — AB (ref >90)
Glucose, Bld: 883 mg/dL (ref 70–99)
POTASSIUM: 4.7 meq/L (ref 3.7–5.3)
Sodium: 129 mEq/L — ABNORMAL LOW (ref 137–147)
Total Bilirubin: 0.3 mg/dL (ref 0.3–1.2)
Total Protein: 8.3 g/dL (ref 6.0–8.3)

## 2013-09-08 LAB — CBC WITH DIFFERENTIAL/PLATELET
Basophils Absolute: 0 10*3/uL (ref 0.0–0.1)
Basophils Relative: 1 % (ref 0–1)
EOS ABS: 0 10*3/uL (ref 0.0–0.7)
Eosinophils Relative: 1 % (ref 0–5)
HCT: 38.5 % — ABNORMAL LOW (ref 39.0–52.0)
Hemoglobin: 12.4 g/dL — ABNORMAL LOW (ref 13.0–17.0)
LYMPHS ABS: 1.1 10*3/uL (ref 0.7–4.0)
LYMPHS PCT: 28 % (ref 12–46)
MCH: 26.1 pg (ref 26.0–34.0)
MCHC: 32.2 g/dL (ref 30.0–36.0)
MCV: 81.1 fL (ref 78.0–100.0)
Monocytes Absolute: 0.3 10*3/uL (ref 0.1–1.0)
Monocytes Relative: 6 % (ref 3–12)
NEUTROS PCT: 65 % (ref 43–77)
Neutro Abs: 2.6 10*3/uL (ref 1.7–7.7)
PLATELETS: 164 10*3/uL (ref 150–400)
RBC: 4.75 MIL/uL (ref 4.22–5.81)
RDW: 15.2 % (ref 11.5–15.5)
WBC: 4 10*3/uL (ref 4.0–10.5)

## 2013-09-08 LAB — URINALYSIS, ROUTINE W REFLEX MICROSCOPIC
BILIRUBIN URINE: NEGATIVE
Glucose, UA: >1000 mg/dL — AB
Ketones, ur: NEGATIVE mg/dL
LEUKOCYTES UA: NEGATIVE
Nitrite: NEGATIVE
PH: 5 (ref 5.0–8.0)
Protein, ur: >300 mg/dL — AB
Specific Gravity, Urine: 1.02 (ref 1.005–1.030)
UROBILINOGEN UA: 0.2 mg/dL (ref 0.0–1.0)

## 2013-09-08 LAB — URINE MICROSCOPIC-ADD ON

## 2013-09-08 LAB — CBG MONITORING, ED: Glucose-Capillary: >600 mg/dL (ref 70–99)

## 2013-09-08 MED ORDER — CARVEDILOL 12.5 MG PO TABS
12.5000 mg | ORAL_TABLET | Freq: Two times a day (BID) | ORAL | Status: DC
  Administered 2013-09-09 – 2013-09-12 (×7): 12.5 mg via ORAL
  Filled 2013-09-08 (×10): qty 1

## 2013-09-08 MED ORDER — SODIUM CHLORIDE 0.9 % IV SOLN
INTRAVENOUS | Status: DC
  Administered 2013-09-09: 01:00:00 via INTRAVENOUS

## 2013-09-08 MED ORDER — HEPARIN SODIUM (PORCINE) 5000 UNIT/ML IJ SOLN
5000.0000 [IU] | Freq: Three times a day (TID) | INTRAMUSCULAR | Status: DC
  Filled 2013-09-08: qty 1

## 2013-09-08 MED ORDER — ATORVASTATIN CALCIUM 80 MG PO TABS
80.0000 mg | ORAL_TABLET | Freq: Every day | ORAL | Status: DC
  Administered 2013-09-09 – 2013-09-12 (×4): 80 mg via ORAL
  Filled 2013-09-08 (×4): qty 1

## 2013-09-08 MED ORDER — SODIUM CHLORIDE 0.9 % IV BOLUS (SEPSIS)
500.0000 mL | Freq: Once | INTRAVENOUS | Status: AC
  Administered 2013-09-08: 500 mL via INTRAVENOUS

## 2013-09-08 MED ORDER — HYDRALAZINE HCL 50 MG PO TABS
50.0000 mg | ORAL_TABLET | Freq: Three times a day (TID) | ORAL | Status: DC
  Administered 2013-09-09 – 2013-09-12 (×11): 50 mg via ORAL
  Filled 2013-09-08 (×14): qty 1

## 2013-09-08 MED ORDER — SODIUM CHLORIDE 0.9 % IV SOLN
INTRAVENOUS | Status: DC
  Administered 2013-09-08: 5.4 [IU]/h via INTRAVENOUS
  Filled 2013-09-08: qty 1

## 2013-09-08 MED ORDER — AMLODIPINE BESYLATE 10 MG PO TABS
10.0000 mg | ORAL_TABLET | Freq: Every day | ORAL | Status: DC
  Administered 2013-09-09 – 2013-09-12 (×4): 10 mg via ORAL
  Filled 2013-09-08 (×4): qty 1

## 2013-09-08 MED ORDER — SODIUM CHLORIDE 0.9 % IV SOLN
INTRAVENOUS | Status: DC
  Administered 2013-09-09: 14.3 [IU]/h via INTRAVENOUS

## 2013-09-08 MED ORDER — DEXTROSE 50 % IV SOLN: 25.0000 mL | INTRAVENOUS | Status: DC | PRN

## 2013-09-08 MED ORDER — ASPIRIN EC 81 MG PO TBEC
81.0000 mg | DELAYED_RELEASE_TABLET | Freq: Every day | ORAL | Status: DC
  Administered 2013-09-09 – 2013-09-12 (×4): 81 mg via ORAL
  Filled 2013-09-08 (×4): qty 1

## 2013-09-08 NOTE — ED Notes (Signed)
IV access attempted x2 without success.

## 2013-09-08 NOTE — ED Provider Notes (Signed)
CSN: [id number]     Arrival date & time 09/08/13  1952 History   First MD Initiated Contact with Patient 09/08/13 2030     Chief Complaint  Patient presents with  . Hyperglycemia     (Consider location/radiation/quality/duration/timing/severity/associated sxs/prior Treatment) The history is provided by the patient.    Pt with hx DM presents with hyperglycemia and right ear pain.  Checked his blood sugar and noted it was "high."  Was in the 300s yesterday.  Is eating and drinking well.  Has been feeling fine.  States he feels that he had a pimple pop in his ear, has been scratching it with his finger, no other treatment.  Has been taking his insulin.  Otherwise has no complaints.  Is urinating a lot.  Denies fevers, pain anywhere, SOB, N/V/D. Patient has just moved to [place] from [place] - [address] and [place] - he now lives in [place].  Per patient's sister "they wanted to put him in a nursing home" so she brought him to live with her.  Dr Mikeal Hawthorne is his new PCP.  Has an appointment to see [facility] nephrology on 8/12.    Patient states he has Stage IV renal disease but has not started dialysis - per patient when he was in [place] they "looked at my arms with ultrasound" and talked about this as a possibility but he has had no surgery and has never had dialysis.    Past Medical History  Diagnosis Date  . ESRD (end stage renal disease)   . Hypertension   . Diabetes mellitus without complication   . CHF (congestive heart failure)   . Stroke   . Seizures    History reviewed. No pertinent past surgical history. No family history on file. History  Substance Use Topics  . Smoking status: Current Every Day Smoker  . Smokeless tobacco: Not on file  . Alcohol Use: No    Review of Systems  All other systems reviewed and are negative.     Allergies  Review of patient's allergies indicates no known allergies.  Home Medications   Prior to Admission medications   Not  on File   BP 147/85  Pulse 60  Temp(Src) 98.2 F (36.8 C)  Resp 15  SpO2 100% Physical Exam  Nursing note and vitals reviewed. Constitutional: He appears well-developed and well-nourished. No distress.  HENT:  Head: Normocephalic and atraumatic.  Small pustule within right ear canal with associated mild edema.  No drainage.   Neck: Neck supple.  Cardiovascular: Normal rate and regular rhythm.   Pulmonary/Chest: Effort normal and breath sounds normal. No respiratory distress. He has no wheezes. He has no rales.  Abdominal: Soft. He exhibits no distension and no mass. There is no tenderness. There is no rebound and no guarding.  Neurological: He is alert. He exhibits normal muscle tone.  Skin: He is not diaphoretic.    ED Course  Procedures (including critical care time) Labs Review Labs Reviewed  CBC WITH DIFFERENTIAL - Abnormal; Notable for the following:    Hemoglobin 12.4 (*)    HCT 38.5 (*)    All other components within normal limits  COMPREHENSIVE METABOLIC PANEL - Abnormal; Notable for the following:    Sodium 129 (*)    Chloride 94 (*)    Glucose, Bld 883 (*)    BUN 85 (*)    Creatinine, Ser 5.43 (*)    Albumin 3.2 (*)    AST 58 (*)  Alkaline Phosphatase 126 (*)    GFR calc non Af Amer 11 (*)    GFR calc Af Amer 12 (*)    Anion gap 16 (*)    All other components within normal limits  URINALYSIS, ROUTINE W REFLEX MICROSCOPIC - Abnormal; Notable for the following:    APPearance CLOUDY (*)    Glucose, UA >1000 (*)    Hgb urine dipstick SMALL (*)    Protein, ur >300 (*)    All other components within normal limits  URINE MICROSCOPIC-ADD ON - Abnormal; Notable for the following:    Casts HYALINE CASTS (*)    All other components within normal limits  CBG MONITORING, ED - Abnormal; Notable for the following:    Glucose-Capillary >600 (*)    All other components within normal limits  CBG MONITORING, ED - Abnormal; Notable for the following:     Glucose-Capillary >600 (*)    All other components within normal limits  BASIC METABOLIC PANEL  BASIC METABOLIC PANEL  BASIC METABOLIC PANEL  BASIC METABOLIC PANEL  BASIC METABOLIC PANEL  BASIC METABOLIC PANEL  HEMOGLOBIN A1C    Imaging Review No results found.   EKG Interpretation None      MDM   Final diagnoses:  Hyperglycemia  ESRD (end stage renal disease)    Afebrile nontoxic diabetic patient p/w hyperglycemia and painful pustule within right ear canal. Pt with multiple medical problems but no prior hospitalizations or visits in our system and it is unclear what his baseline is, particularly with his renal function.  [place] Blood glucose 883.  Hyponatremic, renal failure.  Admitted to [facility].      Union SpringsEmily Beckett Hickmon, PA-C 09/09/13 [phone]

## 2013-09-08 NOTE — ED Notes (Signed)
Pt. reports elevated blood sugar at home this evening > 600 - took 4 units Novolog sq prior to arrival , pt. states " I feel ok except for my right ear is hurting " , alert and oriented /respirations unlabored.

## 2013-09-08 NOTE — ED Notes (Signed)
Eme, RN asked to attempt IV.

## 2013-09-08 NOTE — H&P (Addendum)
[facility] History and Physical  Patient: [name] [name]  ZOX:[id number]  DOB: Feb 26, 1955  DOS: the patient was seen and examined on 09/08/2013 PCP: No primary provider on file.  Chief Complaint: High blood sugar  HPI: Crescencio [name] is a 58 y.o. male with Past medical history of Diabetes mellitus, end-stage renal disease, hypertension, CHF, history of CVA. Patient presented with complaints of high blood sugar. He mentions she is primarily from [place], [place], where he was admitted a few times at [facility]. He also has followed up at [facility]. He was recently admitted and was discharged to a nursing home from where he was discharged on 08/07/2013. Then He moved to [place] to live with his sister,And has seen Primary provider and is scheduled to see Dr. Mikeal Hawthorne. Since last one day the patient has noted that his sugar has been running above 600 daily for his sugars Running in 300. after his recent hospitalization His insulin was reduced. He mentions he is compliant with all the insulin without any change despite which his sugar has been running higher today. He denies any nausea vomiting or abdominal pain. Denies any fever chills dehydration diarrhea or burning urination chest pain shortness of breath cough. He has constant itching which is not unusual for him. He denies any other recent change in his medications  The patient is coming from home. And at his baseline independent for most of his ADL.  Review of Systems: as mentioned in the history of present illness.  A Comprehensive review of the other systems is negative.  Past Medical History  Diagnosis Date  . ESRD (end stage renal disease)   . Hypertension   . Diabetes mellitus without complication   . CHF (congestive heart failure)   . Stroke   . Seizures    History reviewed. No pertinent past surgical history. Social History:  reports that he has been smoking Cigarettes.  He has been smoking about 0.50  packs per day. He does not have any smokeless tobacco history on file. He reports that he does not drink alcohol or use illicit drugs.  Allergies  Allergen Reactions  . Pork-Derived Products     unknown    No family history on file.  Prior to Admission medications   Medication Sig Start Date End Date Taking? Authorizing Provider  amLODipine (NORVASC) 10 MG tablet Take 10 mg by mouth daily.   Yes Historical Provider, MD  aspirin 81 MG tablet Take 81 mg by mouth daily.   Yes Historical Provider, MD  atorvastatin (LIPITOR) 80 MG tablet Take 80 mg by mouth daily.   Yes Historical Provider, MD  carvedilol (COREG) 12.5 MG tablet Take 12.5 mg by mouth 2 (two) times daily with a meal.   Yes Historical Provider, MD  furosemide (LASIX) 40 MG tablet Take 40 mg by mouth daily.   Yes Historical Provider, MD  hydrALAZINE (APRESOLINE) 50 MG tablet Take 50 mg by mouth 3 (three) times daily.   Yes Historical Provider, MD  insulin aspart (NOVOLOG) 100 UNIT/ML injection Inject 5 Units into the skin 3 (three) times daily before meals. 5 units at breakfast, 3 units at lunch, 4 units at supper.   Yes Historical Provider, MD  insulin glargine (LANTUS) 100 UNIT/ML injection Inject 7 Units into the skin at bedtime.   Yes Historical Provider, MD    Physical Exam: Filed Vitals:   09/08/13 2245 09/08/13 2300 09/08/13 2312 09/08/13 2315  BP: 147/76 146/73 142/78 142/78  Pulse: 59 59  60 59  Temp:   97.4 F (36.3 C)   TempSrc:   Oral   Resp: 14 16 17 14   SpO2: 100% 99% 100% 99%    General: Alert, Awake and Oriented to Time, Place and Person. Appear in mild distress Eyes: PERRL ENT: Oral Mucosa clear moist. Neck: no JVD Cardiovascular: S1 and S2 Present, no Murmur, Peripheral Pulses Present Respiratory: Bilateral Air entry equal and Decreased, Clear to Auscultation, noCrackles, no wheezes Abdomen: Bowel Sound Present, Soft and Non tender Skin: no Rash Extremities: no Pedal edema, no calf  tenderness Neurologic: Grossly no focal neuro deficit.  Labs on Admission:  CBC:  Recent Labs Lab 09/08/13 2050  WBC 4.0  NEUTROABS 2.6  HGB 12.4*  HCT 38.5*  MCV 81.1  PLT 164    CMP     Component Value Date/Time   NA 129* 09/08/2013 2050   K 4.7 09/08/2013 2050   CL 94* 09/08/2013 2050   CO2 19 09/08/2013 2050   GLUCOSE 883* 09/08/2013 2050   BUN 85* 09/08/2013 2050   CREATININE 5.43* 09/08/2013 2050   CALCIUM 8.4 09/08/2013 2050   PROT 8.3 09/08/2013 2050   ALBUMIN 3.2* 09/08/2013 2050   AST 58* 09/08/2013 2050   ALT 49 09/08/2013 2050   ALKPHOS 126* 09/08/2013 2050   BILITOT 0.3 09/08/2013 2050   GFRNONAA 11* 09/08/2013 2050   GFRAA 12* 09/08/2013 2050    No results found for this basename: LIPASE, AMYLASE,  in the last 168 hours No results found for this basename: AMMONIA,  in the last 168 hours  No results found for this basename: CKTOTAL, CKMB, CKMBINDEX, TROPONINI,  in the last 168 hours BNP (last 3 results) No results found for this basename: PROBNP,  in the last 8760 hours  Radiological Exams on Admission: No results found.  Assessment/Plan Principal Problem:   Hyperglycemic crisis in diabetes mellitus Active Problems:   ESRD (end stage renal disease)   Hypertension   CHF (congestive heart failure)   Seizures   H/O: CVA (cerebrovascular accident)   1. Hyperglycemic crisis in diabetes mellitus The patient is presenting with complaints of high blood sugar. His sugars have been running about 600 throughout the day. Here the at 700+ feet he does not have significant anion gap which he has chronic kidney disease with which he appears to have chronic metabolic acidosis. His urine Negative for Ketone. With this the patient is currently started on IV Hydration as well as IV insulin drip. Patient will be admitted to step down unit. We will monitor his BMP every 4 hours. Patient will be kept on clear liquids. Plan will be toTransition him to regular home insulin along  with a regular IV fluids when his sugar dropped down to 200. Monitor potassium, serum sodium and anion gap in the meantime.  2.Hypertension Chronic diastolic CHF. Stable  At present stable continue home medication hold the Lasix, in view of hyperglycemia and related dehydration  3.History of CVA Continue aspirin continue Lipitor  DVT Prophylaxis: Mechanical compression device Nutrition: Clear liquid diet  Code Status: Full  Disposition: Admitted to inpatient in step-down unit.  Author: Lynden OxfordPranav Lilly Gasser, MD [facility] Pager: [phone] 09/08/2013, 11:48 PM    If 7PM-7AM, please contact night-coverage [email or web address] Password [email or web address]  **Disclaimer: This note may have been dictated with voice recognition software. Similar sounding words can inadvertently be transcribed and this note may contain transcription errors which may not have been corrected upon publication of note.**

## 2013-09-09 ENCOUNTER — Inpatient Hospital Stay (HOSPITAL_COMMUNITY): Payer: Medicare Other

## 2013-09-09 ENCOUNTER — Encounter (HOSPITAL_COMMUNITY): Payer: Self-pay

## 2013-09-09 ENCOUNTER — Ambulatory Visit: Payer: Medicare Other | Admitting: Occupational Therapy

## 2013-09-09 DIAGNOSIS — I10 Essential (primary) hypertension: Secondary | ICD-10-CM | POA: Diagnosis present

## 2013-09-09 DIAGNOSIS — Z8673 Personal history of transient ischemic attack (TIA), and cerebral infarction without residual deficits: Secondary | ICD-10-CM

## 2013-09-09 DIAGNOSIS — N185 Chronic kidney disease, stage 5: Secondary | ICD-10-CM

## 2013-09-09 DIAGNOSIS — E1310 Other specified diabetes mellitus with ketoacidosis without coma: Principal | ICD-10-CM

## 2013-09-09 DIAGNOSIS — N186 End stage renal disease: Secondary | ICD-10-CM | POA: Insufficient documentation

## 2013-09-09 DIAGNOSIS — R569 Unspecified convulsions: Secondary | ICD-10-CM | POA: Diagnosis present

## 2013-09-09 DIAGNOSIS — I5032 Chronic diastolic (congestive) heart failure: Secondary | ICD-10-CM | POA: Diagnosis present

## 2013-09-09 DIAGNOSIS — I639 Cerebral infarction, unspecified: Secondary | ICD-10-CM | POA: Insufficient documentation

## 2013-09-09 LAB — GLUCOSE, CAPILLARY
GLUCOSE-CAPILLARY: 199 mg/dL — AB (ref 70–99)
GLUCOSE-CAPILLARY: 222 mg/dL — AB (ref 70–99)
GLUCOSE-CAPILLARY: 106 mg/dL — AB (ref 70–99)
GLUCOSE-CAPILLARY: 84 mg/dL (ref 70–99)
GLUCOSE-CAPILLARY: 87 mg/dL (ref 70–99)
Glucose-Capillary: 103 mg/dL — ABNORMAL HIGH (ref 70–99)
Glucose-Capillary: 143 mg/dL — ABNORMAL HIGH (ref 70–99)
Glucose-Capillary: 145 mg/dL — ABNORMAL HIGH (ref 70–99)
Glucose-Capillary: 166 mg/dL — ABNORMAL HIGH (ref 70–99)
Glucose-Capillary: 168 mg/dL — ABNORMAL HIGH (ref 70–99)
Glucose-Capillary: 209 mg/dL — ABNORMAL HIGH (ref 70–99)
Glucose-Capillary: 236 mg/dL — ABNORMAL HIGH (ref 70–99)
Glucose-Capillary: 246 mg/dL — ABNORMAL HIGH (ref 70–99)
Glucose-Capillary: 360 mg/dL — ABNORMAL HIGH (ref 70–99)
Glucose-Capillary: 366 mg/dL — ABNORMAL HIGH (ref 70–99)
Glucose-Capillary: 538 mg/dL — ABNORMAL HIGH (ref 70–99)

## 2013-09-09 LAB — BASIC METABOLIC PANEL
ANION GAP: 15 (ref 5–15)
Anion gap: 13 (ref 5–15)
Anion gap: 13 (ref 5–15)
Anion gap: 14 (ref 5–15)
Anion gap: 14 (ref 5–15)
Anion gap: 16 — ABNORMAL HIGH (ref 5–15)
BUN: 79 mg/dL — AB (ref 6–23)
BUN: 79 mg/dL — AB (ref 6–23)
BUN: 80 mg/dL — AB (ref 6–23)
BUN: 81 mg/dL — ABNORMAL HIGH (ref 6–23)
BUN: 82 mg/dL — ABNORMAL HIGH (ref 6–23)
BUN: 83 mg/dL — ABNORMAL HIGH (ref 6–23)
CALCIUM: 8.5 mg/dL (ref 8.4–10.5)
CHLORIDE: 103 meq/L (ref 96–112)
CHLORIDE: 103 meq/L (ref 96–112)
CHLORIDE: 104 meq/L (ref 96–112)
CO2: 18 mEq/L — ABNORMAL LOW (ref 19–32)
CO2: 19 mEq/L (ref 19–32)
CO2: 19 meq/L (ref 19–32)
CO2: 20 mEq/L (ref 19–32)
CO2: 20 meq/L (ref 19–32)
CO2: 22 mEq/L (ref 19–32)
CREATININE: 4.8 mg/dL — AB (ref 0.50–1.35)
CREATININE: 4.51 mg/dL — AB (ref 0.50–1.35)
Calcium: 8.1 mg/dL — ABNORMAL LOW (ref 8.4–10.5)
Calcium: 8.1 mg/dL — ABNORMAL LOW (ref 8.4–10.5)
Calcium: 8.3 mg/dL — ABNORMAL LOW (ref 8.4–10.5)
Calcium: 8.4 mg/dL (ref 8.4–10.5)
Calcium: 8.5 mg/dL (ref 8.4–10.5)
Chloride: 104 mEq/L (ref 96–112)
Chloride: 106 mEq/L (ref 96–112)
Chloride: 97 mEq/L (ref 96–112)
Creatinine, Ser: 4.8 mg/dL — ABNORMAL HIGH (ref 0.50–1.35)
Creatinine, Ser: 4.89 mg/dL — ABNORMAL HIGH (ref 0.50–1.35)
Creatinine, Ser: 5 mg/dL — ABNORMAL HIGH (ref 0.50–1.35)
Creatinine, Ser: 5.08 mg/dL — ABNORMAL HIGH (ref 0.50–1.35)
GFR calc Af Amer: 13 mL/min — ABNORMAL LOW (ref >90)
GFR calc Af Amer: 14 mL/min — ABNORMAL LOW (ref >90)
GFR calc Af Amer: 14 mL/min — ABNORMAL LOW (ref >90)
GFR calc Af Amer: 14 mL/min — ABNORMAL LOW (ref >90)
GFR calc Af Amer: 15 mL/min — ABNORMAL LOW (ref >90)
GFR calc non Af Amer: 11 mL/min — ABNORMAL LOW (ref >90)
GFR calc non Af Amer: 12 mL/min — ABNORMAL LOW (ref >90)
GFR calc non Af Amer: 12 mL/min — ABNORMAL LOW (ref >90)
GFR calc non Af Amer: 12 mL/min — ABNORMAL LOW (ref >90)
GFR calc non Af Amer: 13 mL/min — ABNORMAL LOW (ref >90)
GFR, EST AFRICAN AMERICAN: 14 mL/min — AB (ref >90)
GFR, EST NON AFRICAN AMERICAN: 12 mL/min — AB (ref >90)
GLUCOSE: 128 mg/dL — AB (ref 70–99)
GLUCOSE: 135 mg/dL — AB (ref 70–99)
GLUCOSE: 237 mg/dL — AB (ref 70–99)
GLUCOSE: 676 mg/dL — AB (ref 70–99)
GLUCOSE: 93 mg/dL (ref 70–99)
Glucose, Bld: 287 mg/dL — ABNORMAL HIGH (ref 70–99)
POTASSIUM: 3.9 meq/L (ref 3.7–5.3)
Potassium: 3.3 mEq/L — ABNORMAL LOW (ref 3.7–5.3)
Potassium: 3.7 mEq/L (ref 3.7–5.3)
Potassium: 3.7 mEq/L (ref 3.7–5.3)
Potassium: 4 mEq/L (ref 3.7–5.3)
Potassium: 4.2 mEq/L (ref 3.7–5.3)
SODIUM: 140 meq/L (ref 137–147)
SODIUM: 140 meq/L (ref 137–147)
Sodium: 131 mEq/L — ABNORMAL LOW (ref 137–147)
Sodium: 136 mEq/L — ABNORMAL LOW (ref 137–147)
Sodium: 136 mEq/L — ABNORMAL LOW (ref 137–147)
Sodium: 137 mEq/L (ref 137–147)

## 2013-09-09 LAB — IRON AND TIBC
IRON: 15 ug/dL — AB (ref 42–135)
SATURATION RATIOS: 5 % — AB (ref 20–55)
TIBC: 317 ug/dL (ref 215–435)
UIBC: 302 ug/dL (ref 125–400)

## 2013-09-09 LAB — MRSA PCR SCREENING: MRSA by PCR: NEGATIVE

## 2013-09-09 LAB — HEMOGLOBIN A1C
HEMOGLOBIN A1C: 11.4 % — AB (ref <5.7)
Mean Plasma Glucose: 280 mg/dL — ABNORMAL HIGH (ref <117)

## 2013-09-09 MED ORDER — INSULIN ASPART 100 UNIT/ML ~~LOC~~ SOLN
0.0000 [IU] | Freq: Three times a day (TID) | SUBCUTANEOUS | Status: DC
  Administered 2013-09-09: 3 [IU] via SUBCUTANEOUS
  Administered 2013-09-09: 15 [IU] via SUBCUTANEOUS

## 2013-09-09 MED ORDER — PNEUMOCOCCAL VAC POLYVALENT 25 MCG/0.5ML IJ INJ
0.5000 mL | INJECTION | INTRAMUSCULAR | Status: AC
  Administered 2013-09-11: 0.5 mL via INTRAMUSCULAR
  Filled 2013-09-09 (×2): qty 0.5

## 2013-09-09 MED ORDER — SODIUM CHLORIDE 0.9 % IV SOLN
INTRAVENOUS | Status: DC
  Administered 2013-09-09: 14:00:00 via INTRAVENOUS
  Filled 2013-09-09: qty 1

## 2013-09-09 MED ORDER — SODIUM CHLORIDE 0.9 % IV SOLN: INTRAVENOUS | Status: DC

## 2013-09-09 MED ORDER — DEXTROSE 50 % IV SOLN: 25.0000 mL | INTRAVENOUS | Status: DC | PRN

## 2013-09-09 MED ORDER — SODIUM CHLORIDE 0.9 % IV SOLN
INTRAVENOUS | Status: DC
Start: 2013-09-09 — End: 2013-09-09

## 2013-09-09 MED ORDER — DEXTROSE-NACL 5-0.45 % IV SOLN
INTRAVENOUS | Status: DC
  Administered 2013-09-09: 14:00:00 via INTRAVENOUS

## 2013-09-09 MED ORDER — INSULIN GLARGINE 100 UNIT/ML ~~LOC~~ SOLN
7.0000 [IU] | Freq: Every day | SUBCUTANEOUS | Status: DC
  Filled 2013-09-09 (×2): qty 0.07

## 2013-09-09 MED ORDER — NEPRO/CARBSTEADY PO LIQD
237.0000 mL | Freq: Three times a day (TID) | ORAL | Status: DC
  Administered 2013-09-09 – 2013-09-12 (×6): 237 mL via ORAL
  Filled 2013-09-09 (×13): qty 237

## 2013-09-09 MED ORDER — INSULIN ASPART 100 UNIT/ML ~~LOC~~ SOLN: 0.0000 [IU] | Freq: Every day | SUBCUTANEOUS | Status: DC

## 2013-09-09 MED ORDER — DEXTROSE-NACL 5-0.45 % IV SOLN
INTRAVENOUS | Status: DC
  Administered 2013-09-09: 03:00:00 via INTRAVENOUS

## 2013-09-09 NOTE — ED Provider Notes (Signed)
Medical screening examination/treatment/procedure(s) were performed by non-physician practitioner and as supervising physician I was immediately available for consultation/collaboration.   EKG Interpretation None       Martha K Linker, MD 09/09/13 1518 

## 2013-09-09 NOTE — Progress Notes (Signed)
[facility] PROGRESS NOTE  [name] [name] WPY:[id number] DOB: [birth year] DOA: 09/08/2013 PCP: No primary provider on file.  Assessment/Plan: DKA/uncontrolled diabetes mellitus -Anion gap is not closed at this time, and has not met blood glucose stabilzer criteria; we'll continue IV insulin as per glucose stabilizer protocol follow and transition to subcutaneous insulin when he meets criteria as above -Continue serial bmets -Continue gentle hydration, close monitoring of fluid status Active Problems:  CKD stage 5 -Just moved from [place], does not have a nephrologist the -I have consulted nephrology for further recommendations Hypertension  -Continue hydralazine Coreg and Norvasc -Continue holding Lasix for now History of CHF (congestive heart failure)  -Compensated -close monitoring of I&O's and daily weights gentle hydration -Renal consulted, follow and and if/when to resume Lasix H/O CVA (cerebrovascular accident) -Continue aspirin and Lipitor   Code Status: full Family Communication: None at bedside Disposition Plan: keep in step down unit for now   Consultants:  renal  Procedures:  none  Antibiotics:  none  HPI/Subjective: states feeling better today, denies chest pain, shortness of breath no nausea or vomiting  Objective: Filed Vitals:   09/09/13 0734  BP: 169/84  Pulse: 64  Temp: 97.8 F (36.6 C)  Resp: 18    Intake/Output Summary (Last 24 hours) at 09/09/13 1032 Last data filed at 09/09/13 0734  Gross per 24 hour  Intake    175 ml  Output    500 ml  Net   -325 ml   Filed Weights   09/09/13 0031  Weight: 47.4 kg (104 lb 8 oz)    Exam:  General: alert & oriented x 3 In NAD Cardiovascular: RRR, nl S1 s2 Respiratory: Decreased breath sounds at the bases, no crackles or wheezes Abdomen: soft +BS NT/ND, no masses palpable Extremities: No cyanosis and no edema     Data Reviewed: Basic Metabolic Panel:  Recent Labs Lab  09/08/13 2050 09/09/13 0003 09/09/13 0250 09/09/13 0526  NA 129* 131* 140 140  K 4.7 4.0 3.3* 3.9  CL 94* 97 104 106  CO2 19 18* 22 19  GLUCOSE 883* 676* 135* 93  BUN 85* 83* 82* 81*  CREATININE 5.43* 5.08* 5.00* 4.89*  CALCIUM 8.4 8.1* 8.5 8.5   Liver Function Tests:  Recent Labs Lab 09/08/13 2050  AST 58*  ALT 49  ALKPHOS 126*  BILITOT 0.3  PROT 8.3  ALBUMIN 3.2*   No results found for this basename: LIPASE, AMYLASE,  in the last 168 hours No results found for this basename: AMMONIA,  in the last 168 hours CBC:  Recent Labs Lab 09/08/13 2050  WBC 4.0  NEUTROABS 2.6  HGB 12.4*  HCT 38.5*  MCV 81.1  PLT 164   Cardiac Enzymes: No results found for this basename: CKTOTAL, CKMB, CKMBINDEX, TROPONINI,  in the last 168 hours BNP (last 3 results) No results found for this basename: PROBNP,  in the last 8760 hours CBG:  Recent Labs Lab 09/09/13 0029 09/09/13 0136 09/09/13 0229 09/09/13 0332 09/09/13 0731  GLUCAP 538* 366* 199* 103* 168*    Recent Results (from the past 240 hour(s))  MRSA PCR SCREENING     Status: None   Collection Time    09/09/13  1:57 AM      Result Value Ref Range Status   MRSA by PCR NEGATIVE  NEGATIVE Final   Comment:            The GeneXpert MRSA Assay (FDA     approved for NASAL  specimens     only), is one component of a     comprehensive MRSA colonization     surveillance program. It is not     intended to diagnose MRSA     infection nor to guide or     monitor treatment for     MRSA infections.     Studies: No results found.  Scheduled Meds: . amLODipine  10 mg Oral Daily  . aspirin EC  81 mg Oral Daily  . atorvastatin  80 mg Oral Daily  . carvedilol  12.5 mg Oral BID WC  . hydrALAZINE  50 mg Oral TID  . insulin aspart  0-15 Units Subcutaneous TID WC  . insulin aspart  0-5 Units Subcutaneous QHS  . insulin glargine  7 Units Subcutaneous QHS  . [START ON 09/10/2013] pneumococcal 23 valent vaccine  0.5 mL  Intramuscular Tomorrow-1000   Continuous Infusions:   Principal Problem:   Hyperglycemic crisis in diabetes mellitus Active Problems:   ESRD (end stage renal disease)   Hypertension   CHF (congestive heart failure)   Seizures   H/O: CVA (cerebrovascular accident)    Time spent: >49mn    [name]  [facility] Pager [phone] If 7PM-7AM, please contact night-coverage at [email or web address], password [email or web address]8/11/2013, 10:32 AM  LOS: 1 day

## 2013-09-09 NOTE — Progress Notes (Signed)
INITIAL NUTRITION ASSESSMENT  Pt meets criteria for moderate MALNUTRITION in the context of chronic illness as evidenced by severe muscle wasting and moderate fat loss throughout body.  DOCUMENTATION CODES Per approved criteria  -Severe malnutrition in the context of chronic illness -Underweight   INTERVENTION: - Nepro shakes TID - Encouraged increased meal intake - Reviewed diabetic and renal diet with pt and provided diet handouts - RD to continue to monitor  NUTRITION DIAGNOSIS: Increased nutrient needs related to pt underweight as evidenced by BMI of 16 kg/(m^2).   Goal: Pt to consume >90% of meals/supplements  Monitor:  Weights, labs, intake  Reason for Assessment: Malnutrition screening tool   58 y.o. male  Admitting Dx: Hyperglycemic crisis in diabetes mellitus  ASSESSMENT: Pt with history of DM, end-stage renal disease, hypertension, CHF, history of CVA. Patient presented with complaints of high blood sugar > 600 mg/dL.   - Met with pt who reports eating 3 meals/day at home with good appetite - Eats eggs/grits/toast for breakfast, a turkey/cheese sandwich for lunch, and a hot meal for dinner like spaghetti or lasagna - Said he has lost 70-80 pounds since 1998 but it has occurred gradually - Not on any nutritional supplements at home - Drinks water throughout the day - [name] he tries to follow diabetic and renal diet and was aware of importance of monitoring potassium, phosphorus, and sodium in his diet - said he has had handouts of this diet information before  Lab Results  Component Value Date   HGBA1C 11.4* 09/09/2013   Nutrition Focused Physical Exam:  Subcutaneous Fat:  Orbital Region: moderate wasting Upper Arm Region: moderate wasting Thoracic and Lumbar Region: moderate wasting  Muscle:  Temple Region: moderate wasting Clavicle Bone Region: severe wasting Clavicle and Acromion Bone Region: severe wasting Scapular Bone Region: NA Dorsal Hand:  moderate wasting Patellar Region: severe wasting Anterior Thigh Region: severe wasting Posterior Calf Region: severe wasting  Edema: None noted   Height: Ht Readings from Last 1 Encounters:  09/09/13 5' 7"  (1.702 m)    Weight: Wt Readings from Last 1 Encounters:  09/09/13 104 lb 8 oz (47.4 kg)    Ideal Body Weight: 148 lbs   % Ideal Body Weight: 70%  Wt Readings from Last 10 Encounters:  09/09/13 104 lb 8 oz (47.4 kg)    Usual Body Weight: 174 lbs in 1998 per pt  % Usual Body Weight: 60%  BMI:  Body mass index is 16.36 kg/(m^2). Underweight  Estimated Nutritional Needs: Kcal: 1650-1850 Protein: 70-90g Fluid: per MD  Skin: Intact  Diet Order: Carb Control  EDUCATION NEEDS: -Education needs addressed   Intake/Output Summary (Last 24 hours) at 09/09/13 1537 Last data filed at 09/09/13 1207  Gross per 24 hour  Intake    175 ml  Output    500 ml  Net   -325 ml    Last BM: 8/11  Labs:   Recent Labs Lab 09/09/13 0250 09/09/13 0526 09/09/13 1110  NA 140 140 136*  K 3.3* 3.9 4.2  CL 104 106 103  CO2 22 19 20   BUN 82* 81* 79*  CREATININE 5.00* 4.89* 4.80*  CALCIUM 8.5 8.5 8.3*  GLUCOSE 135* 93 287*    CBG (last 3)   Recent Labs  09/09/13 1205 09/09/13 1407 09/09/13 1512  GLUCAP 360* 246* 236*    Scheduled Meds: . amLODipine  10 mg Oral Daily  . aspirin EC  81 mg Oral Daily  . atorvastatin  80 mg Oral  Daily  . carvedilol  12.5 mg Oral BID WC  . hydrALAZINE  50 mg Oral TID  . insulin aspart  0-15 Units Subcutaneous TID WC  . insulin aspart  0-5 Units Subcutaneous QHS  . insulin glargine  7 Units Subcutaneous QHS  . [START ON 09/10/2013] pneumococcal 23 valent vaccine  0.5 mL Intramuscular Tomorrow-1000    Continuous Infusions: . sodium chloride    . dextrose 5 % and 0.45% NaCl 75 mL/hr at 09/09/13 1413  . insulin (NOVOLIN-R) infusion      Past Medical History  Diagnosis Date  . ESRD (end stage renal disease)   . Hypertension    . Diabetes mellitus without complication   . CHF (congestive heart failure)   . Stroke   . Seizures     History reviewed. No pertinent past surgical history.  Carlis Stable MS, [place], LDN [phone] Pager [phone] Weekend/After Hours Pager

## 2013-09-09 NOTE — Consult Note (Signed)
[facility] CONSULT [place]   MRN : [id number]  Reason for Consult: Dialysis access ESRD Referring Physician: Trevor IhaJames L Deterding, MD   History of Present Illness: 58 y/o male with CKD stage 5.  We have been asked to provide dialysis access.  His current Cr is 4.8.  Past medical history includes CVA with left hemiparesis, CHF, hypertension treated with carvedilol and Norvasc, hypercholesterolemia treated with Lipitor and DM treated with insulin.  When the patient was admitted, he had blood sugars in the 300s yesterday.  This was associated with polyuria.  The patient has just moved degrees per from [place].  He reports that he has had vein mapping in [place]   Current Facility-Administered Medications  Medication Dose Route Frequency Provider Last Rate Last Dose  . 0.9 %  sodium chloride infusion   Intravenous Continuous Adeline C Viyuoh, MD      . amLODipine (NORVASC) tablet 10 mg  10 mg Oral Daily Lynden OxfordPranav Patel, MD   10 mg at 09/09/13 1023  . aspirin EC tablet 81 mg  81 mg Oral Daily Lynden OxfordPranav Patel, MD   81 mg at 09/09/13 1023  . atorvastatin (LIPITOR) tablet 80 mg  80 mg Oral Daily Lynden OxfordPranav Patel, MD   80 mg at 09/09/13 1023  . carvedilol (COREG) tablet 12.5 mg  12.5 mg Oral BID WC Lynden OxfordPranav Patel, MD   12.5 mg at 09/09/13 0809  . dextrose 5 %-0.45 % sodium chloride infusion   Intravenous Continuous Kela MillinAdeline C Viyuoh, MD 75 mL/hr at 09/09/13 1413    . dextrose 50 % solution 25 mL  25 mL Intravenous PRN Lynden OxfordPranav Patel, MD      . dextrose 50 % solution 25 mL  25 mL Intravenous PRN Adeline C Viyuoh, MD      . hydrALAZINE (APRESOLINE) tablet 50 mg  50 mg Oral TID Lynden OxfordPranav Patel, MD   50 mg at 09/09/13 1023  . insulin aspart (novoLOG) injection 0-15 Units  0-15 Units Subcutaneous TID WC Lynden OxfordPranav Patel, MD   15 Units at 09/09/13 1210  . insulin aspart (novoLOG) injection 0-5 Units  0-5 Units Subcutaneous QHS Lynden OxfordPranav Patel, MD      . insulin glargine (LANTUS) injection 7 Units  7  Units Subcutaneous QHS Lynden OxfordPranav Patel, MD      . insulin regular (NOVOLIN R,HUMULIN R) 1 Units/mL in sodium chloride 0.9 % 100 mL infusion   Intravenous Continuous Kela MillinAdeline C Viyuoh, MD      . Melene Muller[START ON 09/10/2013] pneumococcal 23 valent vaccine (PNU-IMMUNE) injection 0.5 mL  0.5 mL Intramuscular Tomorrow-1000 Lynden OxfordPranav Patel, MD        Pt meds include: Statin :Yes Betablocker: Yes ASA: Yes Other anticoagulants/antiplatelets:   Past Medical History  Diagnosis Date  . ESRD (end stage renal disease)   . Hypertension   . Diabetes mellitus without complication   . CHF (congestive heart failure)   . Stroke   . Seizures     History reviewed. No pertinent past surgical history.  Social History History  Substance Use Topics  . Smoking status: Current Every Day Smoker -- 0.50 packs/day    Types: Cigarettes  . Smokeless tobacco: Not on file  . Alcohol Use: No    Family History History reviewed. No pertinent family history.Mother HTN, CHF  Allergies  Allergen Reactions  . Pork-Derived Products     unknown     REVIEW OF SYSTEMS   Negative review of systems  General: [ ]  Weight loss, [ ]   Fever, [ ]  chills Neurologic: [ ]  Dizziness, [ ]  Blackouts, [ ]  Seizure [ ]  Stroke, [ ]  "Mini stroke", [ ]  Slurred speech, [ ]  Temporary blindness; [ ]  weakness in arms or legs, [ ]  Hoarseness [ ]  Dysphagia Cardiac: [ ]  Chest pain/pressure, [ ]  Shortness of breath at rest [ ]  Shortness of breath with exertion, [ ]  Atrial fibrillation or irregular heartbeat  Vascular: [ ]  Pain in legs with walking, [ ]  Pain in legs at rest, [ ]  Pain in legs at night,  [ ]  Non-healing ulcer, [ ]  Blood clot in vein/DVT,   Pulmonary: [ ]  Home oxygen, [ ]  Productive cough, [ ]  Coughing up blood, [ ]  Asthma,  [ ]  Wheezing [ ]  COPD Musculoskeletal:  [ ]  Arthritis, [ ]  Low back pain, [ ]  Joint pain Hematologic: [ ]  Easy Bruising, [ ]  Anemia; [ ]  Hepatitis Gastrointestinal: [ ]  Blood in stool, [ ]  Gastroesophageal  Reflux/heartburn, Urinary: [ ]  chronic Kidney disease, [ ]  on HD - [ ]  MWF or [ ]  TTHS, [ ]  Burning with urination, [ ]  Difficulty urinating Skin: [ ]  Rashes, [ ]  Wounds Psychological: [ ]  Anxiety, [ ]  Depression  Physical Examination Filed Vitals:   09/09/13 0115 09/09/13 0335 09/09/13 0734 09/09/13 1207  BP: 127/89 125/71 169/84   Pulse:  60 64   Temp:  97.3 F (36.3 C) 97.8 F (36.6 C) 97.8 F (36.6 C)  TempSrc:  Oral Oral Oral  Resp:  13 18   Height:      Weight:      SpO2:  100% 99%    Body mass index is 16.36 kg/(m^2).  General:  WDWN in NAD Gait: Normal HENT: WNL Eyes: Pupils equal Pulmonary: normal non-labored breathing , without Rales, rhonchi,  wheezing Cardiac: RRR, without  Murmurs, rubs or gallops; No carotid bruits Abdomen: soft, NT, no masses Skin: no rashes, ulcers noted;  no Gangrene , no cellulitis; no open wounds;   Vascular Exam/Pulses:Palpable radial , brachial and DP pulses bilaterally.   Musculoskeletal: left LE muscle wasting or atrophy; no edema  Neurologic: A&O X 3; Appropriate Affect ;  SENSATION: normal; MOTOR FUNCTION: 5/5 right upper and left, and wekkness with decreased motor left LE Speech is fluent/normal   Significant Diagnostic Studies: CBC Lab Results  Component Value Date   WBC 4.0 09/08/2013   HGB 12.4* 09/08/2013   HCT 38.5* 09/08/2013   MCV 81.1 09/08/2013   PLT 164 09/08/2013    BMET    Component Value Date/Time   NA 136* 09/09/2013 1110   K 4.2 09/09/2013 1110   CL 103 09/09/2013 1110   CO2 20 09/09/2013 1110   GLUCOSE 287* 09/09/2013 1110   BUN 79* 09/09/2013 1110   CREATININE 4.80* 09/09/2013 1110   CALCIUM 8.3* 09/09/2013 1110   GFRNONAA 12* 09/09/2013 1110   GFRAA 14* 09/09/2013 1110   Estimated Creatinine Clearance: 11.4 ml/min (by C-G formula based on Cr of 4.8).  COAG No results found for this basename: INR, PROTIME     Non-Invasive Vascular Imaging: pending vein mapping  ASSESSMENT/PLAN:  CKD stage 5 in  need of permanent dialysis access. He is not currently on and never has had dialysis in the past. We will plan AV fistula versed graft placement after results are reviewed of the vein mapping. Plan procedure this week.    Clinton Gallant [facility] 09/09/2013 2:18 PM  I agree with the above.  The patient has been seen and examined.  The patient has stage IV chronic renal insufficiency.  He is in need of permanent access.  Vein mapping is pending at this time.  The patient is right handed.  He has a palpable left brachial and left radial pulse.  I suspect that we will proceed with left arm AV fistula, pending the results of his vein mapping.  This will most likely be performed on Thursday.  Durene Cal

## 2013-09-09 NOTE — Progress Notes (Signed)
Utilization review completed.  

## 2013-09-09 NOTE — Consult Note (Signed)
Reason for Consult:CKD 5  Referring Physician: Dr. Karl Bales Coon is an 58 y.o. male.  HPI: 58 yr male with DM x 17 yr, HTN unknown duration, hx CVA L body in 6/15, who just moved here from [place].  Was admitted for hyperglycemia with glu >800.  Eval in [place] for Renal dz and was to get mapping for access.  Does not know name of Nephrologist in [place]. ? hosp in past for CHF but he denies. Moved here to live with sister. Has hx DM retinal disease.  Mother had DM, HTN, CHF, ? Renal disease.  No hx stones, UTIs, or FH of congenital eye, hearing or ms skel defects.  Deines hx of NSAIDs or ACEI.  ? Hx Sz, ^ Lipids (he denies).   Constitutional: negative Eyes: laser surgery for bleeding in past Ears, nose, mouth, throat, and face: negative Respiratory: smoker over 30 yr Cardiovascular: HTN Gastrointestinal: negative Genitourinary:noct x 2 Integument/breast: negative Hematologic/lymphatic: negative Musculoskeletal:negative Neurological: L sided weakness Endocrine: as above Allergic/Immunologic: Pork products   Past Medical History  Diagnosis Date  . ESRD (end stage renal disease)   . Hypertension   . Diabetes mellitus without complication   . CHF (congestive heart failure)   . Stroke   . Seizures     History reviewed. No pertinent past surgical history.  History reviewed. No pertinent family history.  Social History:  reports that he has been smoking Cigarettes.  He has been smoking about 0.50 packs per day. He does not have any smokeless tobacco history on file. He reports that he does not drink alcohol or use illicit drugs.  Allergies:  Allergies  Allergen Reactions  . Pork-Derived Products     unknown    Medications:  I have reviewed the patient's current medications. Prior to Admission:  Prescriptions prior to admission  Medication Sig Dispense Refill  . amLODipine (NORVASC) 10 MG tablet Take 10 mg by mouth daily.      Marland Kitchen aspirin 81 MG tablet Take 81 mg by mouth  daily.      Marland Kitchen atorvastatin (LIPITOR) 80 MG tablet Take 80 mg by mouth daily.      . carvedilol (COREG) 12.5 MG tablet Take 12.5 mg by mouth 2 (two) times daily with a meal.      . furosemide (LASIX) 40 MG tablet Take 40 mg by mouth daily.      . hydrALAZINE (APRESOLINE) 50 MG tablet Take 50 mg by mouth 3 (three) times daily.      . insulin aspart (NOVOLOG) 100 UNIT/ML injection Inject 5 Units into the skin 3 (three) times daily before meals. 5 units at breakfast, 3 units at lunch, 4 units at supper.      . insulin glargine (LANTUS) 100 UNIT/ML injection Inject 7 Units into the skin at bedtime.        Results for orders placed during the hospital encounter of 09/08/13 (from the past 48 hour(s))  CBG MONITORING, ED     Status: Abnormal   Collection Time    09/08/13  8:04 PM      Result Value Ref Range   Glucose-Capillary >600 (*) 70 - 99 mg/dL   Comment 1 Documented in Chart     Comment 2 Notify RN    CBC WITH DIFFERENTIAL     Status: Abnormal   Collection Time    09/08/13  8:50 PM      Result Value Ref Range   WBC 4.0  4.0 - 10.5 K/uL  RBC 4.75  4.22 - 5.81 MIL/uL   Hemoglobin 12.4 (*) 13.0 - 17.0 g/dL   HCT 38.5 (*) 39.0 - 52.0 %   MCV 81.1  78.0 - 100.0 fL   MCH 26.1  26.0 - 34.0 pg   MCHC 32.2  30.0 - 36.0 g/dL   RDW 15.2  11.5 - 15.5 %   Platelets 164  150 - 400 K/uL   Neutrophils Relative % 65  43 - 77 %   Neutro Abs 2.6  1.7 - 7.7 K/uL   Lymphocytes Relative 28  12 - 46 %   Lymphs Abs 1.1  0.7 - 4.0 K/uL   Monocytes Relative 6  3 - 12 %   Monocytes Absolute 0.3  0.1 - 1.0 K/uL   Eosinophils Relative 1  0 - 5 %   Eosinophils Absolute 0.0  0.0 - 0.7 K/uL   Basophils Relative 1  0 - 1 %   Basophils Absolute 0.0  0.0 - 0.1 K/uL  COMPREHENSIVE METABOLIC PANEL     Status: Abnormal   Collection Time    09/08/13  8:50 PM      Result Value Ref Range   Sodium 129 (*) 137 - 147 mEq/L   Potassium 4.7  3.7 - 5.3 mEq/L   Chloride 94 (*) 96 - 112 mEq/L   CO2 19  19 - 32 mEq/L    Glucose, Bld 883 (*) 70 - 99 mg/dL   Comment: CRITICAL RESULT CALLED TO, READ BACK BY AND VERIFIED WITH:     MUMFORD J,RN 09/08/13 2137 WAYK   BUN 85 (*) 6 - 23 mg/dL   Creatinine, Ser 5.43 (*) 0.50 - 1.35 mg/dL   Calcium 8.4  8.4 - 10.5 mg/dL   Total Protein 8.3  6.0 - 8.3 g/dL   Albumin 3.2 (*) 3.5 - 5.2 g/dL   AST 58 (*) 0 - 37 U/L   ALT 49  0 - 53 U/L   Alkaline Phosphatase 126 (*) 39 - 117 U/L   Total Bilirubin 0.3  0.3 - 1.2 mg/dL   GFR calc non Af Amer 11 (*) >90 mL/min   GFR calc Af Amer 12 (*) >90 mL/min   Comment: (NOTE)     The eGFR has been calculated using the CKD EPI equation.     This calculation has not been validated in all clinical situations.     eGFR's persistently <90 mL/min signify possible Chronic Kidney     Disease.   Anion gap 16 (*) 5 - 15  URINALYSIS, ROUTINE W REFLEX MICROSCOPIC     Status: Abnormal   Collection Time    09/08/13  9:28 PM      Result Value Ref Range   Color, Urine YELLOW  YELLOW   APPearance CLOUDY (*) CLEAR   Specific Gravity, Urine 1.020  1.005 - 1.030   pH 5.0  5.0 - 8.0   Glucose, UA >1000 (*) NEGATIVE mg/dL   Hgb urine dipstick SMALL (*) NEGATIVE   Bilirubin Urine NEGATIVE  NEGATIVE   Ketones, ur NEGATIVE  NEGATIVE mg/dL   Protein, ur >300 (*) NEGATIVE mg/dL   Urobilinogen, UA 0.2  0.0 - 1.0 mg/dL   Nitrite NEGATIVE  NEGATIVE   Leukocytes, UA NEGATIVE  NEGATIVE  URINE MICROSCOPIC-ADD ON     Status: Abnormal   Collection Time    09/08/13  9:28 PM      Result Value Ref Range   Squamous Epithelial / LPF RARE  RARE  WBC, UA 0-2  <3 WBC/hpf   RBC / HPF 0-2  <3 RBC/hpf   Bacteria, UA RARE  RARE   Casts HYALINE CASTS (*) NEGATIVE  CBG MONITORING, ED     Status: Abnormal   Collection Time    09/08/13 11:09 PM      Result Value Ref Range   Glucose-Capillary >600 (*) 70 - 99 mg/dL  BASIC METABOLIC PANEL     Status: Abnormal   Collection Time    09/09/13 12:03 AM      Result Value Ref Range   Sodium 131 (*) 137 - 147 mEq/L    Potassium 4.0  3.7 - 5.3 mEq/L   Chloride 97  96 - 112 mEq/L   CO2 18 (*) 19 - 32 mEq/L   Glucose, Bld 676 (*) 70 - 99 mg/dL   Comment: CRITICAL RESULT CALLED TO, READ BACK BY AND VERIFIED WITH:     HANCOCK M,RN 09/09/13 0049 WAYK   BUN 83 (*) 6 - 23 mg/dL   Creatinine, Ser 5.08 (*) 0.50 - 1.35 mg/dL   Calcium 8.1 (*) 8.4 - 10.5 mg/dL   GFR calc non Af Amer 11 (*) >90 mL/min   GFR calc Af Amer 13 (*) >90 mL/min   Comment: (NOTE)     The eGFR has been calculated using the CKD EPI equation.     This calculation has not been validated in all clinical situations.     eGFR's persistently <90 mL/min signify possible Chronic Kidney     Disease.   Anion gap 16 (*) 5 - 15  GLUCOSE, CAPILLARY     Status: Abnormal   Collection Time    09/09/13 12:29 AM      Result Value Ref Range   Glucose-Capillary 538 (*) 70 - 99 mg/dL  GLUCOSE, CAPILLARY     Status: Abnormal   Collection Time    09/09/13  1:36 AM      Result Value Ref Range   Glucose-Capillary 366 (*) 70 - 99 mg/dL  MRSA PCR SCREENING     Status: None   Collection Time    09/09/13  1:57 AM      Result Value Ref Range   MRSA by PCR NEGATIVE  NEGATIVE   Comment:            The GeneXpert MRSA Assay (FDA     approved for NASAL specimens     only), is one component of a     comprehensive MRSA colonization     surveillance program. It is not     intended to diagnose MRSA     infection nor to guide or     monitor treatment for     MRSA infections.  GLUCOSE, CAPILLARY     Status: Abnormal   Collection Time    09/09/13  2:29 AM      Result Value Ref Range   Glucose-Capillary 199 (*) 70 - 99 mg/dL  BASIC METABOLIC PANEL     Status: Abnormal   Collection Time    09/09/13  2:50 AM      Result Value Ref Range   Sodium 140  137 - 147 mEq/L   Comment: DELTA CHECK NOTED   Potassium 3.3 (*) 3.7 - 5.3 mEq/L   Comment: DELTA CHECK NOTED   Chloride 104  96 - 112 mEq/L   CO2 22  19 - 32 mEq/L   Glucose, Bld 135 (*) 70 - 99 mg/dL   BUN  82 (*)  6 - 23 mg/dL   Creatinine, Ser 5.00 (*) 0.50 - 1.35 mg/dL   Calcium 8.5  8.4 - 10.5 mg/dL   GFR calc non Af Amer 12 (*) >90 mL/min   GFR calc Af Amer 14 (*) >90 mL/min   Comment: (NOTE)     The eGFR has been calculated using the CKD EPI equation.     This calculation has not been validated in all clinical situations.     eGFR's persistently <90 mL/min signify possible Chronic Kidney     Disease.   Anion gap 14  5 - 15  GLUCOSE, CAPILLARY     Status: Abnormal   Collection Time    09/09/13  3:32 AM      Result Value Ref Range   Glucose-Capillary 103 (*) 70 - 99 mg/dL  BASIC METABOLIC PANEL     Status: Abnormal   Collection Time    09/09/13  5:26 AM      Result Value Ref Range   Sodium 140  137 - 147 mEq/L   Potassium 3.9  3.7 - 5.3 mEq/L   Chloride 106  96 - 112 mEq/L   CO2 19  19 - 32 mEq/L   Glucose, Bld 93  70 - 99 mg/dL   BUN 81 (*) 6 - 23 mg/dL   Creatinine, Ser 4.89 (*) 0.50 - 1.35 mg/dL   Calcium 8.5  8.4 - 10.5 mg/dL   GFR calc non Af Amer 12 (*) >90 mL/min   GFR calc Af Amer 14 (*) >90 mL/min   Comment: (NOTE)     The eGFR has been calculated using the CKD EPI equation.     This calculation has not been validated in all clinical situations.     eGFR's persistently <90 mL/min signify possible Chronic Kidney     Disease.   Anion gap 15  5 - 15  GLUCOSE, CAPILLARY     Status: Abnormal   Collection Time    09/09/13  7:31 AM      Result Value Ref Range   Glucose-Capillary 168 (*) 70 - 99 mg/dL   Comment 1 Documented in Chart     Comment 2 Notify RN    BASIC METABOLIC PANEL     Status: Abnormal   Collection Time    09/09/13 11:10 AM      Result Value Ref Range   Sodium 136 (*) 137 - 147 mEq/L   Potassium 4.2  3.7 - 5.3 mEq/L   Chloride 103  96 - 112 mEq/L   CO2 20  19 - 32 mEq/L   Glucose, Bld 287 (*) 70 - 99 mg/dL   BUN 79 (*) 6 - 23 mg/dL   Creatinine, Ser 4.80 (*) 0.50 - 1.35 mg/dL   Calcium 8.3 (*) 8.4 - 10.5 mg/dL   GFR calc non Af Amer 12 (*) >90  mL/min   GFR calc Af Amer 14 (*) >90 mL/min   Comment: (NOTE)     The eGFR has been calculated using the CKD EPI equation.     This calculation has not been validated in all clinical situations.     eGFR's persistently <90 mL/min signify possible Chronic Kidney     Disease.   Anion gap 13  5 - 15    No results found.  ROS Blood pressure 169/84, pulse 64, temperature 97.8 F (36.6 C), temperature source Oral, resp. rate 18, height 5' 7"  (1.702 m), weight 47.4 kg (104 lb 8 oz), SpO2 99.00%. Physical  Exam Physical Examination: General appearance - alert, well appearing, and in no distress and uncooperative Mental status - alert, oriented to person, place, and time Eyes - funduscopic exam abnormal DM retinal disease with scarring,laser Mouth - dental hygiene poor Neck - adenopathy noted PCL Lymphatics - posterior cervical nodes Chest - decreased air entry noted bilat Heart - S1 and S2 normal, S4 present, systolic murmur MO7/0 at apex Abdomen - soft,liver down 3 cm pos bs, nontender Neurological - alert, oriented, normal speech, no focal findings or movement disorder noted, L side strength 4/5, R 5/5. No CN deficits Musculoskeletal - no joint tenderness, deformity or swelling Extremities - no pedal edema noted, callus, with some early necrosis lat part of L foot sole Skin - dry ,scaling  Assessment/Plan: 1 CKD5 apparently chronic. Will do some screeening but mostly educate and prepare with access..  Discussed with him and sister. 2 DM per primary 3 Hypertension: will add diuretic, and advance meds 4. Anemia of ESRD: mild now, check Fe 5. Metabolic Bone Disease: check PTH and phos 6 CVA  Not much deficit on exam P Educate, SPEP. UPEP. U/S, access, check PTH, Hep status  Nhia Heaphy L 09/09/2013, 1:01 PM

## 2013-09-10 DIAGNOSIS — Z01810 Encounter for preprocedural cardiovascular examination: Secondary | ICD-10-CM

## 2013-09-10 DIAGNOSIS — N186 End stage renal disease: Secondary | ICD-10-CM

## 2013-09-10 LAB — BASIC METABOLIC PANEL
Anion gap: 12 (ref 5–15)
Anion gap: 12 (ref 5–15)
Anion gap: 14 (ref 5–15)
Anion gap: 15 (ref 5–15)
BUN: 72 mg/dL — AB (ref 6–23)
BUN: 73 mg/dL — ABNORMAL HIGH (ref 6–23)
BUN: 74 mg/dL — AB (ref 6–23)
BUN: 75 mg/dL — AB (ref 6–23)
CHLORIDE: 105 meq/L (ref 96–112)
CHLORIDE: 103 meq/L (ref 96–112)
CO2: 17 mEq/L — ABNORMAL LOW (ref 19–32)
CO2: 18 mEq/L — ABNORMAL LOW (ref 19–32)
CO2: 18 mEq/L — ABNORMAL LOW (ref 19–32)
CO2: 20 meq/L (ref 19–32)
CREATININE: 4.39 mg/dL — AB (ref 0.50–1.35)
CREATININE: 4.54 mg/dL — AB (ref 0.50–1.35)
Calcium: 8 mg/dL — ABNORMAL LOW (ref 8.4–10.5)
Calcium: 8 mg/dL — ABNORMAL LOW (ref 8.4–10.5)
Calcium: 8.3 mg/dL — ABNORMAL LOW (ref 8.4–10.5)
Calcium: 8.3 mg/dL — ABNORMAL LOW (ref 8.4–10.5)
Chloride: 103 mEq/L (ref 96–112)
Chloride: 101 mEq/L (ref 96–112)
Creatinine, Ser: 4.39 mg/dL — ABNORMAL HIGH (ref 0.50–1.35)
Creatinine, Ser: 4.19 mg/dL — ABNORMAL HIGH (ref 0.50–1.35)
GFR calc Af Amer: 16 mL/min — ABNORMAL LOW (ref >90)
GFR calc Af Amer: 16 mL/min — ABNORMAL LOW (ref >90)
GFR calc Af Amer: 17 mL/min — ABNORMAL LOW (ref >90)
GFR calc non Af Amer: 13 mL/min — ABNORMAL LOW (ref >90)
GFR calc non Af Amer: 14 mL/min — ABNORMAL LOW (ref >90)
GFR calc non Af Amer: 14 mL/min — ABNORMAL LOW (ref >90)
GFR, EST AFRICAN AMERICAN: 15 mL/min — AB (ref >90)
GFR, EST NON AFRICAN AMERICAN: 14 mL/min — AB (ref >90)
Glucose, Bld: 129 mg/dL — ABNORMAL HIGH (ref 70–99)
Glucose, Bld: 136 mg/dL — ABNORMAL HIGH (ref 70–99)
Glucose, Bld: 158 mg/dL — ABNORMAL HIGH (ref 70–99)
Glucose, Bld: 415 mg/dL — ABNORMAL HIGH (ref 70–99)
Potassium: 3.8 mEq/L (ref 3.7–5.3)
Potassium: 4 mEq/L (ref 3.7–5.3)
Potassium: 4 mEq/L (ref 3.7–5.3)
Potassium: 4.2 mEq/L (ref 3.7–5.3)
Sodium: 132 mEq/L — ABNORMAL LOW (ref 137–147)
Sodium: 133 mEq/L — ABNORMAL LOW (ref 137–147)
Sodium: 135 mEq/L — ABNORMAL LOW (ref 137–147)
Sodium: 138 mEq/L (ref 137–147)

## 2013-09-10 LAB — COMPREHENSIVE METABOLIC PANEL
ALK PHOS: 92 U/L (ref 39–117)
ALT: 41 U/L (ref 0–53)
AST: 74 U/L — ABNORMAL HIGH (ref 0–37)
Albumin: 2.6 g/dL — ABNORMAL LOW (ref 3.5–5.2)
Anion gap: 12 (ref 5–15)
BUN: 74 mg/dL — ABNORMAL HIGH (ref 6–23)
CO2: 20 meq/L (ref 19–32)
Calcium: 8.2 mg/dL — ABNORMAL LOW (ref 8.4–10.5)
Chloride: 103 mEq/L (ref 96–112)
Creatinine, Ser: 4.54 mg/dL — ABNORMAL HIGH (ref 0.50–1.35)
GFR, EST AFRICAN AMERICAN: 15 mL/min — AB (ref >90)
GFR, EST NON AFRICAN AMERICAN: 13 mL/min — AB (ref >90)
GLUCOSE: 136 mg/dL — AB (ref 70–99)
POTASSIUM: 4 meq/L (ref 3.7–5.3)
SODIUM: 135 meq/L — AB (ref 137–147)
Total Bilirubin: 0.3 mg/dL (ref 0.3–1.2)
Total Protein: 7 g/dL (ref 6.0–8.3)

## 2013-09-10 LAB — DIFFERENTIAL
Basophils Absolute: 0 10*3/uL (ref 0.0–0.1)
Basophils Relative: 1 % (ref 0–1)
Eosinophils Absolute: 0.2 10*3/uL (ref 0.0–0.7)
Eosinophils Relative: 3 % (ref 0–5)
LYMPHS ABS: 2 10*3/uL (ref 0.7–4.0)
Lymphocytes Relative: 33 % (ref 12–46)
Monocytes Absolute: 0.5 10*3/uL (ref 0.1–1.0)
Monocytes Relative: 9 % (ref 3–12)
NEUTROS ABS: 3.4 10*3/uL (ref 1.7–7.7)
NEUTROS PCT: 56 % (ref 43–77)

## 2013-09-10 LAB — GLUCOSE, CAPILLARY
GLUCOSE-CAPILLARY: 150 mg/dL — AB (ref 70–99)
GLUCOSE-CAPILLARY: 154 mg/dL — AB (ref 70–99)
GLUCOSE-CAPILLARY: 170 mg/dL — AB (ref 70–99)
GLUCOSE-CAPILLARY: 173 mg/dL — AB (ref 70–99)
GLUCOSE-CAPILLARY: 248 mg/dL — AB (ref 70–99)
Glucose-Capillary: 147 mg/dL — ABNORMAL HIGH (ref 70–99)
Glucose-Capillary: 157 mg/dL — ABNORMAL HIGH (ref 70–99)
Glucose-Capillary: 148 mg/dL — ABNORMAL HIGH (ref 70–99)
Glucose-Capillary: 195 mg/dL — ABNORMAL HIGH (ref 70–99)
Glucose-Capillary: 134 mg/dL — ABNORMAL HIGH (ref 70–99)
Glucose-Capillary: 180 mg/dL — ABNORMAL HIGH (ref 70–99)
Glucose-Capillary: 283 mg/dL — ABNORMAL HIGH (ref 70–99)
Glucose-Capillary: 201 mg/dL — ABNORMAL HIGH (ref 70–99)
Glucose-Capillary: 237 mg/dL — ABNORMAL HIGH (ref 70–99)
Glucose-Capillary: 484 mg/dL — ABNORMAL HIGH (ref 70–99)

## 2013-09-10 LAB — HEPATITIS C VRS RNA DETECT BY PCR-QUAL: Hepatitis C Vrs RNA by PCR-Qual: POSITIVE — AB

## 2013-09-10 LAB — HEPATITIS B CORE ANTIBODY, IGM: Hep B C IgM: NONREACTIVE

## 2013-09-10 LAB — CBC
HCT: 34.9 % — ABNORMAL LOW (ref 39.0–52.0)
HEMOGLOBIN: 11.4 g/dL — AB (ref 13.0–17.0)
MCH: 25.9 pg — AB (ref 26.0–34.0)
MCHC: 32.7 g/dL (ref 30.0–36.0)
MCV: 79.1 fL (ref 78.0–100.0)
Platelets: 152 10*3/uL (ref 150–400)
RBC: 4.41 MIL/uL (ref 4.22–5.81)
RDW: 15 % (ref 11.5–15.5)
WBC: 6 10*3/uL (ref 4.0–10.5)

## 2013-09-10 LAB — HEPATITIS C ANTIBODY (REFLEX): HCV AB: REACTIVE — AB

## 2013-09-10 LAB — HIV ANTIBODY (ROUTINE TESTING W REFLEX): HIV: NONREACTIVE

## 2013-09-10 LAB — PHOSPHORUS: Phosphorus: 4 mg/dL (ref 2.3–4.6)

## 2013-09-10 LAB — HEPATITIS B SURFACE ANTIGEN: HEP B S AG: NEGATIVE

## 2013-09-10 MED ORDER — INSULIN ASPART 100 UNIT/ML ~~LOC~~ SOLN
0.0000 [IU] | Freq: Three times a day (TID) | SUBCUTANEOUS | Status: DC
  Administered 2013-09-10 (×2): 3 [IU] via SUBCUTANEOUS

## 2013-09-10 MED ORDER — INSULIN GLARGINE 100 UNIT/ML ~~LOC~~ SOLN
10.0000 [IU] | Freq: Every day | SUBCUTANEOUS | Status: DC
  Administered 2013-09-10: 10 [IU] via SUBCUTANEOUS
  Filled 2013-09-10 (×2): qty 0.1

## 2013-09-10 MED ORDER — DEXTROSE 5 % IV SOLN
1.5000 g | INTRAVENOUS | Status: AC
  Administered 2013-09-11: 1.5 g via INTRAVENOUS
  Filled 2013-09-10 (×2): qty 1.5

## 2013-09-10 MED ORDER — ACETAMINOPHEN 325 MG PO TABS: 650.0000 mg | ORAL_TABLET | Freq: Four times a day (QID) | ORAL | Status: DC | PRN

## 2013-09-10 MED ORDER — SODIUM CHLORIDE 0.9 % IV SOLN
1020.0000 mg | Freq: Once | INTRAVENOUS | Status: AC
  Administered 2013-09-10: 1020 mg via INTRAVENOUS
  Filled 2013-09-10: qty 34

## 2013-09-10 NOTE — Clinical Documentation Improvement (Signed)
Possible Clinical Conditions?   Hyponatremia                                 Other Condition___________________                 Cannot Clinically Determine_________   Supporting Information:  Per 09/09/13 ER note: Hyponatremic, renal failure.     Lab Range:  Component     Latest Ref Rng 09/10/2013  Sodium     137 - 147 mEq/L 133 (L)    Thank You, Shelda Palarlene H Cooper ,RN Clinical Documentation Specialist:  [phone]  [facility]- [facility] Information Management

## 2013-09-10 NOTE — Progress Notes (Signed)
Subjective: Interval History: has no complaint .  Objective: Vital signs in last 24 hours: Temp:  [97.7 F (36.5 C)-98.1 F (36.7 C)] 97.8 F (36.6 C) (08/12 0749) Pulse Rate:  [58-63] 58 (08/12 0749) Resp:  [12-15] 12 (08/12 0749) BP: (111-146)/(62-81) 137/73 mmHg (08/12 0749) SpO2:  [99 %-100 %] 100 % (08/12 0749) Weight:  [47.7 kg (105 lb 2.6 oz)] 47.7 kg (105 lb 2.6 oz) (08/12 0500) Weight change: 0.3 kg (10.6 oz)  Intake/Output from previous day: 08/11 0701 - 08/12 0700 In: 509.9 [P.O.:480; I.V.:29.9] Out: 300 [Urine:300] Intake/Output this shift: Total I/O In: 1.1 [I.V.:1.1] Out: 0   General appearance: alert and cooperative Resp: diminished breath sounds bibasilar Cardio: S1, S2 normal and systolic murmur: holosystolic 2/6, blowing at apex GI: pos bs, liver down 4 cm Extremities: extremities normal, atraumatic, no cyanosis or edema  Lab Results:  Recent Labs  09/08/13 2050 09/10/13 0717  WBC 4.0 6.0  HGB 12.4* 11.4*  HCT 38.5* 34.9*  PLT 164 152   BMET:  Recent Labs  09/10/13 0248 09/10/13 0717  NA 133* 135*  K 4.0 4.0  CL 103 103  CO2 18* 20  GLUCOSE 136* 136*  BUN 74* 74*  CREATININE 4.54* 4.54*  CALCIUM 8.0* 8.2*   No results found for this basename: PTH,  in the last 72 hours Iron Studies:  Recent Labs  09/09/13 1440  IRON 15*  TIBC 317    Studies/Results: [place]  09/09/2013   CLINICAL DATA:  Elevated renal function studies ; end-stage renal disease.  EXAM: RENAL/URINARY TRACT ULTRASOUND COMPLETE  COMPARISON:  None.  FINDINGS: Right Kidney:  Length: 10.3 cm. The echotexture is increased and is equal to or slightly greater than that of the adjacent liver. There is no hydronephrosis or focal mass.  Left Kidney:  Length: 10 cm. Increased echotexture is noted diffusely. There is no hydronephrosis or focal mass.  Bladder:  Appears normal for degree of bladder distention.  IMPRESSION: Bilaterally increased cortical echotexture consistent with  medical renal disease. There is no evidence of obstruction.   Electronically Signed   By: David  [place]   On: 09/09/2013 21:07    I have reviewed the patient's current medications.  Assessment/Plan: 1  CKD 5 stable  For access.  Mild acidemia, give bicarb.  U/S with echodense kidneys.  Hep neg. 2 Anemia does not need epo, Fe low bolus 3 DM controlled 4 HTN controlled 5 HPTH pending 6 CVA P IV fe, access, bp meds , DM control, educate    LOS: 2 days   Adaiah Jaskot L 09/10/2013,9:27 AM

## 2013-09-10 NOTE — Progress Notes (Addendum)
Inpatient Diabetes Program Recommendations  AACE/ADA: New Consensus Statement on Inpatient Glycemic Control (2013)  Target Ranges:  Prepandial:   less than 140 mg/dL      Peak postprandial:   less than 180 mg/dL (1-2 hours)      Critically ill patients:  140 - 180 mg/dL   Results for [name] [name], [name] [name] (MRN [id number]) as of 09/10/2013 12:58  Ref. Range 09/10/2013 07:10 09/10/2013 08:13 09/10/2013 09:18 09/10/2013 10:22 09/10/2013 11:27 09/10/2013 12:39  Glucose-Capillary Latest Range: 70-99 mg/dL 782134 (H) 956170 (H) 213180 (H) 173 (H) 283 (H) 248 (H)   Diabetes history: DM Outpatient Diabetes medications: Lantus 7 units QHS, Novolog 5 units with breakfast, Novolog 3 units with lunch, Novolog 4 units with supper Current orders for Inpatient glycemic control: Lantus 10 units daily, Novolog 0-9 units AC  Inpatient Diabetes Program Recommendations Insulin - Basal: Please consider increasing Lantus to 12 units Daily (based on 47.7 kg x 0.25 units). Insulin - Meal Coverage: Please consider ordering Novolog 3 units TID with meals for meal coverage. IV fluids: Please re-evaluate need for D5NS @ 75 ml/hour.   09/10/13@15 :27-Spoke with patient and his sister about diabetes and home regimen for diabetes control. Patient reports that he takes Lantus 7 units QHS and Novolog 5 units TID with meals as an outpatient for diabetes control. Patient reports that his glucose has been "running on the high side for the past week". Patient feels that his glucose has been running high because his Lantus was decreased [place]  Inquired about knowledge about A1C and patient reports that he knows what an A1C is and is able to verbalize the "A1C give a 3 month average of how blood sugar is running".  Discussed A1C results (11.4% on 09/09/13/15) and explained importance of checking CBGs and maintaining good CBG control to prevent long-term and short-term complications. Patient's sister states that the  patient likes to keep his blood sugar a secrete and does not keep her informed about how his glucose is running.  Explained rationale to patient as to why it was important for his family to know how his glucose is running and what medications he is taking for diabetes control. Patient expressed frustration about insulin being given at different times here than he takes at home (he states he takes his Lantus at bedtime and his Novolog before he eats). Explained that Lantus was given at the time of transition off the insulin drip to prevent his glucose from becoming more elevated throughout the day. Patient verbalized understanding of information discussed and he states that he has no further questions at this time related to diabetes.   Thanks, Orlando PennerMarie Aikeem Lilley, RN, MSN, CCRN Diabetes Coordinator Inpatient Diabetes Program [phone] (Team Pager) [phone] (AP office) [phone] [facility] office)

## 2013-09-10 NOTE — Clinical Documentation Improvement (Signed)
Possible Clinical Conditions?  Chronic Systolic Congestive Heart Failure Chronic Diastolic Congestive Heart Failure Chronic Systolic & Diastolic Congestive Heart Failure Acute Systolic Congestive Heart Failure Acute Diastolic Congestive Heart Failure Acute Systolic & Diastolic Congestive Heart Failure Acute on Chronic Systolic Congestive Heart Failure Acute on Chronic Diastolic Congestive Heart Failure Acute on Chronic Systolic & Diastolic Congestive Heart Failure Other Condition________________________________________ Cannot Clinically Determine  Supporting Information:  Per 09/09/13 ED, MD, Consult, Progress Notes: History of CHF (congestive heart failure).     Thank You, Shelda Palarlene H Cooper ,RN Clinical Documentation Specialist:  [phone]  [facility]- Health Information Management

## 2013-09-10 NOTE — Progress Notes (Signed)
VASCULAR LAB PRELIMINARY  PRELIMINARY  PRELIMINARY  PRELIMINARY   Right  Upper Extremity Vein Map    Cephalic  Segment Diameter Depth Comment  1. Axilla 2.6124mm mm   2. Mid upper arm 1.3992mm mm   3. Above AC 1.5351mm mm   4. In [facility] 1.4646mm mm   5. Below AC 1.1223mm mm branch  6. Mid forearm 1.696mm mm   7. Wrist 1.9041mm mm    mm mm    mm mm    mm mm    Basilic  Segment Diameter Depth Comment  1. Axilla 1.274mm mm   2. Mid upper arm 2.3142mm mm   3. Above AC mm mm   4. In AC 1.1542mm mm   5. Below AC 1.11041mm mm   6. Mid forearm 1.4032mm mm   7. Wrist mm mm    mm mm    mm mm    mm mm      Left Upper Extremity Vein Map    Cephalic  Segment Diameter Depth Comment  1. Axilla 2.4624mm mm   2. Mid upper arm 1.2023mm mm   3. Above [facility] 1.8437mm mm   4. In AC 1.2814mm mm   5. Below AC 1.5414mm mm branch  6. Mid forearm mm mm   7. Wrist mm mm    mm mm    mm mm    mm mm    Basilic  Segment Diameter Depth Comment  1. Axilla mm mm   2. Mid upper arm mm mm   3. Above AC 2.6401mm mm   4. In [facility] 1.4355mm mm   5. Below AC 1.3314mm mm   6. Mid forearm 1.101mm mm   7. Wrist mm mm    mm mm    mm mm    mm mm    Farrel DemarkJill Eunice, RDMS, RVT  09/10/2013, 11:46 AM

## 2013-09-10 NOTE — Progress Notes (Addendum)
[facility] PROGRESS NOTE  [name] [name] ZOX:[id number] DOB: 1955/10/29 DOA: 09/08/2013 PCP: No primary provider on file.  Assessment/Plan: DKA/uncontrolled diabetes mellitus -Anion gap is closed -transition to lantus, SSI -FU hbaic -DM coordinator consult  CKD stage 5 -Just moved from [place],  -renal following -for Vein mapping and AVF thursday  Hypertension  -Continue hydralazine Coreg and Norvasc  History of CHF (congestive heart failure)  -Compensated -lasix on hold, check ECHO for baseline EF  H/O CVA (cerebrovascular accident) -Continue aspirin and Lipitor  Code Status: full Family Communication: None at bedside, called and updated brother  Disposition Plan: Tx to floor  Consultants:  renal  Procedures:  none  Antibiotics:  none  HPI/Subjective: Denies any complaints, except for mild ear lobe pain  Objective: Filed Vitals:   09/10/13 1441  BP:   Pulse:   Temp: 97.8 F (36.6 C)  Resp:     Intake/Output Summary (Last 24 hours) at 09/10/13 1459 Last data filed at 09/10/13 1300  Gross per 24 hour  Intake 269.17 ml  Output    600 ml  Net -330.83 ml   Filed Weights   09/09/13 0031 09/10/13 0500  Weight: 47.4 kg (104 lb 8 oz) 47.7 kg (105 lb 2.6 oz)    Exam:  General: alert & oriented x 3 In NAD HEENT: unable to visualize any abnormality in the ear Cardiovascular: RRR, nl S1 s2 Respiratory: Decreased breath sounds at the bases, no crackles or wheezes Abdomen: soft +BS NT/ND, no masses palpable Extremities: No cyanosis and no edema     Data Reviewed: Basic Metabolic Panel:  Recent Labs Lab 09/09/13 1800 09/09/13 2331 09/10/13 0248 09/10/13 0717 09/10/13 0958  NA 137 138 133* 135* 135*  K 3.7 4.0 4.0 4.0 3.8  CL 104 105 103 103 103  CO2 19 18* 18* 20 20  GLUCOSE 128* 129* 136* 136* 158*  BUN 79* 75* 74* 74* 72*  CREATININE 4.51* 4.39* 4.54* 4.54* 4.39*  CALCIUM 8.1* 8.0* 8.0* 8.2* 8.3*  PHOS  --   --   --  4.0  --     Liver Function Tests:  Recent Labs Lab 09/08/13 2050 09/10/13 0717  AST 58* 74*  ALT 49 41  ALKPHOS 126* 92  BILITOT 0.3 0.3  PROT 8.3 7.0  ALBUMIN 3.2* 2.6*   No results found for this basename: LIPASE, AMYLASE,  in the last 168 hours No results found for this basename: AMMONIA,  in the last 168 hours CBC:  Recent Labs Lab 09/08/13 2050 09/10/13 0717  WBC 4.0 6.0  NEUTROABS 2.6 3.4  HGB 12.4* 11.4*  HCT 38.5* 34.9*  MCV 81.1 79.1  PLT 164 152   Cardiac Enzymes: No results found for this basename: CKTOTAL, CKMB, CKMBINDEX, TROPONINI,  in the last 168 hours BNP (last 3 results) No results found for this basename: PROBNP,  in the last 8760 hours CBG:  Recent Labs Lab 09/10/13 0813 09/10/13 0918 09/10/13 1022 09/10/13 1127 09/10/13 1239  GLUCAP 170* 180* 173* 283* 248*    Recent Results (from the past 240 hour(s))  MRSA PCR SCREENING     Status: None   Collection Time    09/09/13  1:57 AM      Result Value Ref Range Status   MRSA by PCR NEGATIVE  NEGATIVE Final   Comment:            The GeneXpert MRSA Assay (FDA     approved for NASAL specimens     only),  is one component of a     comprehensive MRSA colonization     surveillance program. It is not     intended to diagnose MRSA     infection nor to guide or     monitor treatment for     MRSA infections.     Studies: [place] Renal  09/09/2013   CLINICAL DATA:  Elevated renal function studies ; end-stage renal disease.  EXAM: RENAL/URINARY TRACT ULTRASOUND COMPLETE  COMPARISON:  None.  FINDINGS: Right Kidney:  Length: 10.3 cm. The echotexture is increased and is equal to or slightly greater than that of the adjacent liver. There is no hydronephrosis or focal mass.  Left Kidney:  Length: 10 cm. Increased echotexture is noted diffusely. There is no hydronephrosis or focal mass.  Bladder:  Appears normal for degree of bladder distention.  IMPRESSION: Bilaterally increased cortical echotexture consistent with  medical renal disease. There is no evidence of obstruction.   Electronically Signed   By: David  [place]azil[place]   On: 09/09/2013 21:07    Scheduled Meds: . amLODipine  10 mg Oral Daily  . aspirin EC  81 mg Oral Daily  . atorvastatin  80 mg Oral Daily  . carvedilol  12.5 mg Oral BID WC  . feeding supplement (NEPRO CARB STEADY)  237 mL Oral TID BM  . hydrALAZINE  50 mg Oral TID  . insulin aspart  0-9 Units Subcutaneous TID WC  . insulin glargine  10 Units Subcutaneous Daily  . pneumococcal 23 valent vaccine  0.5 mL Intramuscular Tomorrow-1000   Continuous Infusions:   Principal Problem:   Hyperglycemic crisis in diabetes mellitus Active Problems:   ESRD (end stage renal disease)   Hypertension   CHF (congestive heart failure)   Seizures   H/O: CVA (cerebrovascular accident)    Time spent: >1425min    [facility] Hospital[facility]SEPH,Saharah Sherrow  [facility] Pager [phone]. If 7PM-7AM, please contact night-coverage at [email or web address], password [email or web address] 09/10/2013, 2:59 PM  LOS: 2 days

## 2013-09-10 NOTE — Progress Notes (Signed)
Pt transferred to 3E28 per MD order. Report called and all questions answered.

## 2013-09-10 NOTE — Progress Notes (Signed)
Report given to receiving RN. Patient in bed resting no verbal complaints and no signs or symptoms of distress noted.  

## 2013-09-10 NOTE — Progress Notes (Signed)
Pt frustrated regarding dietary restrictions, educations done, but pt still requesting foods out of range. Due to insistency patients requests allowed.

## 2013-09-11 ENCOUNTER — Encounter (HOSPITAL_COMMUNITY): Payer: Medicare Other | Admitting: Anesthesiology

## 2013-09-11 ENCOUNTER — Inpatient Hospital Stay (HOSPITAL_COMMUNITY): Payer: Medicare Other | Admitting: Anesthesiology

## 2013-09-11 ENCOUNTER — Encounter (HOSPITAL_COMMUNITY): Payer: Self-pay | Admitting: Anesthesiology

## 2013-09-11 ENCOUNTER — Encounter (HOSPITAL_COMMUNITY)
Admission: EM | Disposition: A | Discharge status: Home / Self Care | Payer: Self-pay | Source: Home / Self Care | Attending: Internal Medicine

## 2013-09-11 DIAGNOSIS — E1310 Other specified diabetes mellitus with ketoacidosis without coma: Secondary | ICD-10-CM | POA: Diagnosis not present

## 2013-09-11 HISTORY — PX: AV FISTULA PLACEMENT: SHX1204

## 2013-09-11 LAB — RENAL FUNCTION PANEL
Albumin: 2.5 g/dL — ABNORMAL LOW (ref 3.5–5.2)
Anion gap: 14 (ref 5–15)
BUN: 70 mg/dL — ABNORMAL HIGH (ref 6–23)
CO2: 17 meq/L — AB (ref 19–32)
CREATININE: 4.07 mg/dL — AB (ref 0.50–1.35)
Calcium: 8.5 mg/dL (ref 8.4–10.5)
Chloride: 104 mEq/L (ref 96–112)
GFR, EST AFRICAN AMERICAN: 17 mL/min — AB (ref >90)
GFR, EST NON AFRICAN AMERICAN: 15 mL/min — AB (ref >90)
GLUCOSE: 215 mg/dL — AB (ref 70–99)
Phosphorus: 4.2 mg/dL (ref 2.3–4.6)
Potassium: 3.9 mEq/L (ref 3.7–5.3)
Sodium: 135 mEq/L — ABNORMAL LOW (ref 137–147)

## 2013-09-11 LAB — GLUCOSE, CAPILLARY
GLUCOSE-CAPILLARY: 201 mg/dL — AB (ref 70–99)
GLUCOSE-CAPILLARY: 117 mg/dL — AB (ref 70–99)
GLUCOSE-CAPILLARY: 160 mg/dL — AB (ref 70–99)
GLUCOSE-CAPILLARY: 361 mg/dL — AB (ref 70–99)
Glucose-Capillary: 331 mg/dL — ABNORMAL HIGH (ref 70–99)
Glucose-Capillary: 271 mg/dL — ABNORMAL HIGH (ref 70–99)
Glucose-Capillary: 117 mg/dL — ABNORMAL HIGH (ref 70–99)
Glucose-Capillary: 140 mg/dL — ABNORMAL HIGH (ref 70–99)
Glucose-Capillary: 108 mg/dL — ABNORMAL HIGH (ref 70–99)
Glucose-Capillary: 112 mg/dL — ABNORMAL HIGH (ref 70–99)
Glucose-Capillary: 104 mg/dL — ABNORMAL HIGH (ref 70–99)
Glucose-Capillary: 124 mg/dL — ABNORMAL HIGH (ref 70–99)
Glucose-Capillary: 121 mg/dL — ABNORMAL HIGH (ref 70–99)
Glucose-Capillary: 165 mg/dL — ABNORMAL HIGH (ref 70–99)

## 2013-09-11 LAB — BASIC METABOLIC PANEL
Anion gap: 15 (ref 5–15)
BUN: 67 mg/dL — AB (ref 6–23)
CALCIUM: 8.6 mg/dL (ref 8.4–10.5)
CO2: 17 mEq/L — ABNORMAL LOW (ref 19–32)
CREATININE: 4 mg/dL — AB (ref 0.50–1.35)
Chloride: 106 mEq/L (ref 96–112)
GFR, EST AFRICAN AMERICAN: 18 mL/min — AB (ref >90)
GFR, EST NON AFRICAN AMERICAN: 15 mL/min — AB (ref >90)
GLUCOSE: 83 mg/dL (ref 70–99)
Potassium: 3.7 mEq/L (ref 3.7–5.3)
Sodium: 138 mEq/L (ref 137–147)

## 2013-09-11 LAB — CBC
HCT: 35 % — ABNORMAL LOW (ref 39.0–52.0)
HEMOGLOBIN: 11.5 g/dL — AB (ref 13.0–17.0)
MCH: 25.8 pg — AB (ref 26.0–34.0)
MCHC: 32.9 g/dL (ref 30.0–36.0)
MCV: 78.7 fL (ref 78.0–100.0)
Platelets: 163 10*3/uL (ref 150–400)
RBC: 4.45 MIL/uL (ref 4.22–5.81)
RDW: 15.2 % (ref 11.5–15.5)
WBC: 4.5 10*3/uL (ref 4.0–10.5)

## 2013-09-11 LAB — PROTIME-INR
INR: 1.17 (ref 0.00–1.49)
PROTHROMBIN TIME: 14.9 s (ref 11.6–15.2)

## 2013-09-11 LAB — PROTEIN ELECTROPHORESIS, SERUM
ALPHA-2-GLOBULIN: 10.8 % (ref 7.1–11.8)
Albumin ELP: 44.9 % — ABNORMAL LOW (ref 55.8–66.1)
Alpha-1-Globulin: 5.3 % — ABNORMAL HIGH (ref 2.9–4.9)
Beta 2: 4.3 % (ref 3.2–6.5)
Beta Globulin: 6.5 % (ref 4.7–7.2)
Gamma Globulin: 28.2 % — ABNORMAL HIGH (ref 11.1–18.8)
M-SPIKE, %: NOT DETECTED g/dL
Total Protein ELP: 7 g/dL (ref 6.0–8.3)

## 2013-09-11 LAB — SURGICAL PCR SCREEN
MRSA, PCR: NEGATIVE
Staphylococcus aureus: POSITIVE — AB

## 2013-09-11 LAB — PARATHYROID HORMONE, INTACT (NO CA): PTH: 70.3 pg/mL (ref 14.0–72.0)

## 2013-09-11 SURGERY — ARTERIOVENOUS (AV) FISTULA CREATION
Anesthesia: Monitor Anesthesia Care | Site: Arm Lower | Laterality: Left

## 2013-09-11 MED ORDER — DEXTROSE 50 % IV SOLN: 25.0000 mL | INTRAVENOUS | Status: DC | PRN

## 2013-09-11 MED ORDER — PROPOFOL 10 MG/ML IV BOLUS
INTRAVENOUS | Status: DC | PRN
Start: 2013-09-11 — End: 2013-09-11
  Administered 2013-09-11: 20 mg via INTRAVENOUS

## 2013-09-11 MED ORDER — PROPOFOL INFUSION 10 MG/ML OPTIME
INTRAVENOUS | Status: DC | PRN
  Administered 2013-09-11: 50 ug/kg/min via INTRAVENOUS

## 2013-09-11 MED ORDER — SODIUM BICARBONATE 650 MG PO TABS
1300.0000 mg | ORAL_TABLET | Freq: Two times a day (BID) | ORAL | Status: DC
  Administered 2013-09-11 (×2): 1300 mg via ORAL
  Filled 2013-09-11 (×4): qty 2

## 2013-09-11 MED ORDER — FUROSEMIDE 80 MG PO TABS
80.0000 mg | ORAL_TABLET | Freq: Every day | ORAL | Status: DC
  Administered 2013-09-11 – 2013-09-12 (×2): 80 mg via ORAL
  Filled 2013-09-11 (×2): qty 1

## 2013-09-11 MED ORDER — INSULIN GLARGINE 100 UNIT/ML ~~LOC~~ SOLN
7.0000 [IU] | Freq: Every day | SUBCUTANEOUS | Status: DC
  Filled 2013-09-11: qty 0.07

## 2013-09-11 MED ORDER — SODIUM CHLORIDE 0.9 % IV SOLN
INTRAVENOUS | Status: DC
  Administered 2013-09-11 (×2): 2.7 [IU]/h via INTRAVENOUS
  Filled 2013-09-11: qty 1

## 2013-09-11 MED ORDER — SODIUM CHLORIDE 0.9 % IR SOLN
Status: DC | PRN
  Administered 2013-09-11: 1000 mL

## 2013-09-11 MED ORDER — OXYCODONE-ACETAMINOPHEN 5-325 MG PO TABS
1.0000 | ORAL_TABLET | ORAL | Status: DC | PRN
  Administered 2013-09-11: 2 via ORAL
  Administered 2013-09-12: 1 via ORAL
  Administered 2013-09-12: 2 via ORAL
  Filled 2013-09-11: qty 2
  Filled 2013-09-11: qty 1
  Filled 2013-09-11: qty 2

## 2013-09-11 MED ORDER — INSULIN GLARGINE 100 UNIT/ML ~~LOC~~ SOLN
5.0000 [IU] | SUBCUTANEOUS | Status: AC
Start: 2013-09-11 — End: 2013-09-11
  Administered 2013-09-11: 5 [IU] via SUBCUTANEOUS
  Filled 2013-09-11: qty 0.05

## 2013-09-11 MED ORDER — SODIUM CHLORIDE 0.9 % IV SOLN
INTRAVENOUS | Status: DC
  Administered 2013-09-11: 125 mL/h via INTRAVENOUS

## 2013-09-11 MED ORDER — INSULIN ASPART 100 UNIT/ML ~~LOC~~ SOLN
3.0000 [IU] | Freq: Three times a day (TID) | SUBCUTANEOUS | Status: DC
  Administered 2013-09-11 – 2013-09-12 (×3): 3 [IU] via SUBCUTANEOUS

## 2013-09-11 MED ORDER — PROPOFOL 10 MG/ML IV BOLUS
INTRAVENOUS | Status: AC
  Filled 2013-09-11: qty 20

## 2013-09-11 MED ORDER — DEXTROSE 5 % IV SOLN: 1.5000 g | INTRAVENOUS | Status: DC | PRN

## 2013-09-11 MED ORDER — SODIUM CHLORIDE 0.9 % IV SOLN
INTRAVENOUS | Status: DC | PRN
  Administered 2013-09-11: 12:00:00 via INTRAVENOUS

## 2013-09-11 MED ORDER — LIDOCAINE-EPINEPHRINE (PF) 1 %-1:200000 IJ SOLN
INTRAMUSCULAR | Status: DC | PRN
  Administered 2013-09-11: 27 mL via INTRADERMAL

## 2013-09-11 MED ORDER — POTASSIUM CHLORIDE 10 MEQ/100ML IV SOLN
10.0000 meq | INTRAVENOUS | Status: DC
  Filled 2013-09-11 (×4): qty 100

## 2013-09-11 MED ORDER — MIDAZOLAM HCL 2 MG/2ML IJ SOLN
INTRAMUSCULAR | Status: AC
  Filled 2013-09-11: qty 2

## 2013-09-11 MED ORDER — INSULIN GLARGINE 100 UNIT/ML ~~LOC~~ SOLN
12.0000 [IU] | Freq: Every day | SUBCUTANEOUS | Status: DC
  Administered 2013-09-11: 12 [IU] via SUBCUTANEOUS
  Filled 2013-09-11 (×2): qty 0.12

## 2013-09-11 MED ORDER — INSULIN ASPART 100 UNIT/ML ~~LOC~~ SOLN
0.0000 [IU] | Freq: Three times a day (TID) | SUBCUTANEOUS | Status: DC
  Administered 2013-09-12: 5 [IU] via SUBCUTANEOUS
  Administered 2013-09-12: 3 [IU] via SUBCUTANEOUS

## 2013-09-11 MED ORDER — DEXTROSE-NACL 5-0.45 % IV SOLN
INTRAVENOUS | Status: DC
  Administered 2013-09-11: 75 mL/h via INTRAVENOUS

## 2013-09-11 MED ORDER — MIDAZOLAM HCL 5 MG/5ML IJ SOLN
INTRAMUSCULAR | Status: DC | PRN
  Administered 2013-09-11 (×2): 1 mg via INTRAVENOUS

## 2013-09-11 MED ORDER — 0.9 % SODIUM CHLORIDE (POUR BTL) OPTIME
TOPICAL | Status: DC | PRN
  Administered 2013-09-11: 1000 mL

## 2013-09-11 MED ORDER — SODIUM CHLORIDE 0.9 % IV SOLN
Freq: Once | INTRAVENOUS | Status: DC
Start: 2013-09-11 — End: 2013-09-11

## 2013-09-11 MED ORDER — POTASSIUM CHLORIDE 10 MEQ/100ML IV SOLN
10.0000 meq | Freq: Once | INTRAVENOUS | Status: AC
  Administered 2013-09-11: 10 meq via INTRAVENOUS
  Filled 2013-09-11: qty 100

## 2013-09-11 MED ORDER — LIDOCAINE HCL (CARDIAC) 20 MG/ML IV SOLN
INTRAVENOUS | Status: DC | PRN
  Administered 2013-09-11: 30 mg via INTRAVENOUS

## 2013-09-11 MED ORDER — FENTANYL CITRATE 0.05 MG/ML IJ SOLN
INTRAMUSCULAR | Status: AC
  Filled 2013-09-11: qty 5

## 2013-09-11 MED ORDER — LIDOCAINE-EPINEPHRINE (PF) 1 %-1:200000 IJ SOLN
INTRAMUSCULAR | Status: AC
  Filled 2013-09-11: qty 10

## 2013-09-11 MED ORDER — FENTANYL CITRATE 0.05 MG/ML IJ SOLN
INTRAMUSCULAR | Status: DC | PRN
  Administered 2013-09-11: 50 ug via INTRAVENOUS

## 2013-09-11 SURGICAL SUPPLY — 44 items
ARMBAND PINK RESTRICT EXTREMIT (MISCELLANEOUS) ×2 IMPLANT
BLADE 10 SAFETY STRL DISP (BLADE) IMPLANT
CANISTER SUCTION 2500CC (MISCELLANEOUS) ×2 IMPLANT
CLIP TI MEDIUM 6 (CLIP) ×2 IMPLANT
CLIP TI WIDE RED SMALL 6 (CLIP) ×2 IMPLANT
COVER PROBE W GEL 5X96 (DRAPES) IMPLANT
COVER SURGICAL LIGHT HANDLE (MISCELLANEOUS) ×2 IMPLANT
DECANTER SPIKE VIAL GLASS SM (MISCELLANEOUS) ×2 IMPLANT
DERMABOND ADVANCED (GAUZE/BANDAGES/DRESSINGS) ×1
DERMABOND ADVANCED .7 DNX12 (GAUZE/BANDAGES/DRESSINGS) ×1 IMPLANT
DRAIN PENROSE 1/2X12 LTX STRL (WOUND CARE) IMPLANT
ELECT REM PT RETURN 9FT ADLT (ELECTROSURGICAL) ×2
ELECTRODE REM PT RTRN 9FT ADLT (ELECTROSURGICAL) ×1 IMPLANT
GEL ULTRASOUND 20GR AQUASONIC (MISCELLANEOUS) ×2 IMPLANT
GLOVE BIO SURGEON STRL SZ7.5 (GLOVE) ×2 IMPLANT
GLOVE BIOGEL PI IND STRL 6.5 (GLOVE) ×1 IMPLANT
GLOVE BIOGEL PI IND STRL 7.0 (GLOVE) ×1 IMPLANT
GLOVE BIOGEL PI IND STRL 7.5 (GLOVE) ×1 IMPLANT
GLOVE BIOGEL PI IND STRL 8 (GLOVE) ×3 IMPLANT
GLOVE BIOGEL PI INDICATOR 6.5 (GLOVE) ×1
GLOVE BIOGEL PI INDICATOR 7.0 (GLOVE) ×1
GLOVE BIOGEL PI INDICATOR 7.5 (GLOVE) ×1
GLOVE BIOGEL PI INDICATOR 8 (GLOVE) ×3
GLOVE ECLIPSE 6.5 STRL STRAW (GLOVE) ×2 IMPLANT
GLOVE SS BIOGEL STRL SZ 7 (GLOVE) ×1 IMPLANT
GLOVE SUPERSENSE BIOGEL SZ 7 (GLOVE) ×1
GOWN STRL REUS W/ TWL LRG LVL3 (GOWN DISPOSABLE) ×4 IMPLANT
GOWN STRL REUS W/TWL LRG LVL3 (GOWN DISPOSABLE) ×4
GRAFT GORETEX STRT 4-7X45 (Vascular Products) ×2 IMPLANT
KIT BASIN OR (CUSTOM PROCEDURE TRAY) ×2 IMPLANT
KIT ROOM TURNOVER OR (KITS) ×2 IMPLANT
NS IRRIG 1000ML POUR BTL (IV SOLUTION) ×2 IMPLANT
PACK CV ACCESS (CUSTOM PROCEDURE TRAY) ×2 IMPLANT
PAD ARMBOARD 7.5X6 YLW CONV (MISCELLANEOUS) ×4 IMPLANT
PROBE PENCIL 8 MHZ STRL DISP (MISCELLANEOUS) ×2 IMPLANT
SPONGE SURGIFOAM ABS GEL 100 (HEMOSTASIS) IMPLANT
SUT PROLENE 6 0 BV (SUTURE) ×6 IMPLANT
SUT VIC AB 3-0 SH 27 (SUTURE) ×1
SUT VIC AB 3-0 SH 27X BRD (SUTURE) ×1 IMPLANT
SUT VICRYL 4-0 PS2 18IN ABS (SUTURE) ×4 IMPLANT
TOWEL OR 17X24 6PK STRL BLUE (TOWEL DISPOSABLE) ×2 IMPLANT
TOWEL OR 17X26 10 PK STRL BLUE (TOWEL DISPOSABLE) ×2 IMPLANT
UNDERPAD 30X30 INCONTINENT (UNDERPADS AND DIAPERS) ×2 IMPLANT
WATER STERILE IRR 1000ML POUR (IV SOLUTION) ×2 IMPLANT

## 2013-09-11 NOTE — Progress Notes (Signed)
Pt. On glucose stabilizer this am. Report given to oncoming RN. Dr. Jomarie LongsJoseph made aware of pts. Current status and labs.

## 2013-09-11 NOTE — Care Management Note (Addendum)
    Page 1 of 2   09/12/2013     11:50:56 AM CARE MANAGEMENT NOTE 09/12/2013  Patient:  [name] [name],[name] [name]   Account Number:  [id number]  Date Initiated:  09/09/2013  Documentation initiated by:  [name] [name],[name] [name]  Subjective/Objective Assessment:   Pt admitted with hyperglycemia//PTA pt lived at home with sister     Action/Plan:   Glucostabilizer, A-V Fistula,//[facility].   Anticipated DC Date:  09/13/2013   Anticipated DC Plan:  HOME W HOME HEALTH SERVICES  In-house referral  Chaplain      DC Planning Services  CM consult      Choice offered to / List presented to:  C-1 Patient        HH arranged  HH-1 RN  HH-10 DISEASE MANAGEMENT  HH-2 PT      HH agency  [facility]   Status of service:  Completed, signed off Medicare Important Message given?  YES (If response is "NO", the following Medicare IM given date fields will be blank) Date Medicare IM given:  09/11/2013 Medicare IM given by:  [facility]Abb Gobert Date Additional Medicare IM given:   Additional Medicare IM given by:    Discharge Disposition:    Per UR Regulation:  Reviewed for med. necessity/level of care/duration of stay  If discussed at Long Length of Stay Meetings, dates discussed:    Comments:  09/12/13 1[address] Oletta Cohnamellia Decklyn Hornik, RN, BSN, [employer] [phone] Spoke with pt at bedside regarding discharge planning [facility]. Offered pt list of home health agencies to choose from.  Pt chose [facility] to render services. Kizzie Furnishonna Fellmy, RN of [facility] notified.  No DME needs identified at this time.  Confirmed that pt PCP is Dr. Rometta EmeryMohammad L. Garba. FU Apptointment is 8/19 @1415 .  09/11/13 [address] Shylin Keizer, RN, BSN, [place] [phone] Chaplain Laurey ArrowLarry Brown visited with Mr. Buford Dresserledger while finalizing an Advanced Directive.  Copy place on chart.

## 2013-09-11 NOTE — Progress Notes (Signed)
CRITICAL VALUE ALERT  Critical value received:  CBG of 484   Date of notification:  09/10/2013  Time of notification:  2047  Critical value read back:Yes.    Nurse who received alert:  Renato ShinKaren Yomaris Palecek, RN   MD notified (1st page):  Merdis DelayK. Schorr, NP  Time of first page:  2130  MD notified (2nd page):  Time of second page:  Responding MD:  Merdis DelayK. Schorr, NP  Time MD responded:

## 2013-09-11 NOTE — Progress Notes (Signed)
Report given to receiving RN. Patient in bed sleeping. No signs or symptoms of distress or discomfort. 

## 2013-09-11 NOTE — Progress Notes (Signed)
Subjective: Interval History: has complaints R ear stopped up.  Objective: Vital signs in last 24 hours: Temp:  [97.5 F (36.4 C)-98.1 F (36.7 C)] 98.1 F (36.7 C) (08/13 0525) Pulse Rate:  [61-103] 61 (08/13 0525) Resp:  [15-18] 18 (08/13 0525) BP: (111-154)/(68-86) 154/74 mmHg (08/13 0525) SpO2:  [98 %-100 %] 100 % (08/13 0525) Weight:  [48.217 kg (106 lb 4.8 oz)-48.5 kg (106 lb 14.8 oz)] 48.217 kg (106 lb 4.8 oz) (08/13 0525) Weight change: 0.8 kg (1 lb 12.2 oz)  Intake/Output from previous day: 08/12 0701 - 08/13 0700 In: 361.1 [P.O.:360; I.V.:1.1] Out: 1050 [Urine:1050] Intake/Output this shift:    General appearance: alert, cooperative and no distress Resp: diminished breath sounds bilaterally Cardio: S1, S2 normal and systolic murmur: holosystolic 2/6, blowing at apex GI: pos bs, liver down 4 cm, soft Extremities: extremities normal, atraumatic, no cyanosis or edema  Lab Results:  Recent Labs  09/10/13 0717 09/11/13 0536  WBC 6.0 4.5  HGB 11.4* 11.5*  HCT 34.9* 35.0*  PLT 152 163   BMET:  Recent Labs  09/10/13 2319 09/11/13 0512  NA 132* 135*  K 4.2 3.9  CL 101 104  CO2 17* 17*  GLUCOSE 415* 215*  BUN 73* 70*  CREATININE 4.19* 4.07*  CALCIUM 8.3* 8.5   No results found for this basename: PTH,  in the last 72 hours Iron Studies:  Recent Labs  09/09/13 1440  IRON 15*  TIBC 317    Studies/Results: [place] Renal  09/09/2013   CLINICAL DATA:  Elevated renal function studies ; end-stage renal disease.  EXAM: RENAL/URINARY TRACT ULTRASOUND COMPLETE  COMPARISON:  None.  FINDINGS: Right Kidney:  Length: 10.3 cm. The echotexture is increased and is equal to or slightly greater than that of the adjacent liver. There is no hydronephrosis or focal mass.  Left Kidney:  Length: 10 cm. Increased echotexture is noted diffusely. There is no hydronephrosis or focal mass.  Bladder:  Appears normal for degree of bladder distention.  IMPRESSION: Bilaterally increased  cortical echotexture consistent with medical renal disease. There is no evidence of obstruction.   Electronically Signed   By: David  [place]Jordan   On: 09/09/2013 21:07    I have reviewed the patient's current medications.  Assessment/Plan: 1  CKD5 for access. 2 anemia stable, no epo 3 HPTH check 4 HTN improving add Lasix 5 CVA 6 ? SZ 7 DM ^^ this am P Access, lasix, DM control    LOS: 3 days   Luba Matzen L 09/11/2013,9:01 AM

## 2013-09-11 NOTE — Progress Notes (Signed)
Chaplain visited with [name] while finalizing an AD. [name] believes that he should be better informed with what's happening with his care. [name] was very outgoing this morning and humorous, he is in good spirits. [name] requested prayer. Chaplain engaged in empathic listening as well. Chaplain left contact information should he desire a visit. Pt was grateful for the time spent.  Gala RomneyLarry J Brown

## 2013-09-11 NOTE — Op Note (Signed)
    NAME: [name] [name]   MRN: [id number] DOB: [birth year]    DATE OF OPERATION: 09/11/2013  PREOP DIAGNOSIS: stage V chronic kidney disease  POSTOP DIAGNOSIS: stage V chronic kidney disease  PROCEDURE: new left upper arm AV graft (4-7 mm PTFE graft)  SURGEON: Di Kindlehristopher S. Edilia Boickson, MD, FACS  ASSIST: Lianne CureMaureen Collins PA  ANESTHESIA: local with sedation   EBL: minimal  INDICATIONS: [name] [name] is a 58 y.o. male who presents for new access. Vein mapping showed that he did not have adequate vein in either arm.  FINDINGS: brachial vein at the antecubital level would not take a 4 mm dilator.  TECHNIQUE: The patient was taken to the operating room and sedated by anesthesia. The left upper extremity was prepped and draped in the usual sterile fashion. After the skin was anesthetized with 1% lidocaine, a longitudinal incision was made just above the antecubital level near the brachial artery was dissected free. The adjacent brachial vein was small. It was ligated distally and irrigated with heparinized saline. I was unable to pass a 4 mm dilator. For this reason I decided this was not adequate. I elected to place an upper arm graft. A separate longitudinal incision was made beneath the axilla after the skin was anesthetized with 1% lidocaine. A tunnel was created between the 2 incisions after the skin was anesthetized. A 4-7 mm PTFE graft was tunneled between the 2 incisions. The patient had an allergy to pork and therefore we could not use heparin. The brachial artery was clamped proximally and distally a longitudinal arteriotomy was made. A segment of the 4 mm end of the graft was excised, the graft slightly spatulated and sewn end to side to the artery using continuous 6-0 Prolene suture. The graft and poor the appropriate length for anastomosis to the brachial vein. The vein was ligated distally and spatulated proximally. The graft was cut the appropriate length, spatulated and sewn end to end  to the vein using 2 continuous 6-0 Prolene sutures. At completion was an excellent thrill in the graft and a good radial and ulnar signal with the Doppler. Hemostasis was obtained in the wound. The wounds were closed with deep layer 3-0 Vicryl and the skin closed with 4-0 Vicryl. Dermabond was applied. The patient tolerated the procedure well and was transferred to the recovery room in stable condition. All needle and sponge counts were correct.  Waverly Ferrarihristopher Scherrie Seneca, MD, FACS [facility]  DATE OF DICTATION:   09/11/2013

## 2013-09-11 NOTE — Anesthesia Postprocedure Evaluation (Signed)
  Anesthesia Post-op Note  Patient: [name] [name]  Procedure(s) Performed: Procedure(s): Insertion of arteriovenous gortex graft (Left)  Patient Location: PACU  Anesthesia Type:MAC  Level of Consciousness: awake, alert  and oriented  Airway and Oxygen Therapy: Patient Spontanous Breathing and Patient connected to nasal cannula oxygen  Post-op Pain: none  Post-op Assessment: Post-op Vital signs reviewed, Patient's Cardiovascular Status Stable, Respiratory Function Stable, Patent Airway and Pain level controlled  Post-op Vital Signs: stable  Last Vitals:  Filed Vitals:   09/11/13 1414  BP: 119/68  Pulse: 63  Temp: 36.7 C  Resp: 18    Complications: No apparent anesthesia complications

## 2013-09-11 NOTE — Evaluation (Signed)
Physical Therapy Evaluation Patient Details Name: [name] MostClarence [name] MRN: [id number] DOB: Feb 28, 1955 Today's Date: 09/11/2013   History of Present Illness  Pt is a 58 y/o male admitted s/p hyerglycemic crisis in the setting of DM. PMH includes CVA with residual L sided weakness, and fractured patella on the L.   Clinical Impression  Pt admitted with the above. Pt currently with functional limitations due to the deficits listed below (see PT Problem List). At the time of PT eval pt was able to perform functional mobility without physical assist, however demonstrated 2 LOB while ambulating which required mod assist to prevent fall. Pt will benefit from skilled PT to increase their independence and safety with mobility to allow discharge to the venue listed below.       Follow Up Recommendations Home health PT;Supervision for mobility/OOB    Equipment Recommendations  None recommended by PT    Recommendations for Other Services       Precautions / Restrictions Precautions Precautions: Fall Precaution Comments: Pt denies any falls in the past 6 months, however 2 LOB requiring mod assist to prevent fall on PT eval. Restrictions Weight Bearing Restrictions: No      Mobility  Bed Mobility Overal bed mobility: Modified Independent             General bed mobility comments: Pt able to transition to EOB without assistance from therapy. Use of bed rails only.  Transfers Overall transfer level: Needs assistance Equipment used: Rolling walker (2 wheeled) Transfers: Sit to/from Stand Sit to Stand: Supervision         General transfer comment: Pt was cued for hand placement on seated surface for safety.   Ambulation/Gait Ambulation/Gait assistance: Min guard Ambulation Distance (Feet): 100 Feet Assistive device: Rolling walker (2 wheeled) Gait Pattern/deviations: Step-through pattern;Decreased stride length;Trunk flexed Gait velocity: Decreased Gait velocity interpretation:  Below normal speed for age/gender General Gait Details: Pt with 2 LOB within first 10 feet of gait training. Mod assist required to recover. Pt with unsteadiness noted throughout gait training but no further losses of balance.   Stairs            Wheelchair Mobility    Modified Rankin (Stroke Patients Only)       Balance Overall balance assessment: Needs assistance Sitting-balance support: Feet supported;No upper extremity supported Sitting balance-Leahy Scale: Fair     Standing balance support: Bilateral upper extremity supported;During functional activity Standing balance-Leahy Scale: Poor Standing balance comment: Mod assist required to recover from LOB                             Pertinent Vitals/Pain Pain Assessment: No/denies pain    Home Living Family/patient expects to be discharged to:: Private residence Living Arrangements: Other relatives (Sister's house, and brother is there) Available Help at Discharge: Family Type of Home: Apartment Home Access: Stairs to enter   Secretary/administratorntrance Stairs-Number of Steps: 16 Home Layout: One level Home Equipment: Cane - single point;Walker - 2 wheels;Wheelchair - manual;Toilet riser;Shower seat      Prior Function Level of Independence: Independent with assistive device(s)         Comments: No longer drives, not working. community ambulator, uses motorized cart when going to the store. States he uses his walker all the time.     Hand Dominance   Dominant Hand: Right    Extremity/Trunk Assessment   Upper Extremity Assessment: Defer to OT evaluation  Lower Extremity Assessment: Generalized weakness;LLE deficits/detail   LLE Deficits / Details: Decreased strength and AROM consistent with residual weakness from prior CVA  Cervical / Trunk Assessment: Normal (Forward head posture)  Communication   Communication: No difficulties  Cognition Arousal/Alertness: Awake/alert Behavior During  Therapy: WFL for tasks assessed/performed Overall Cognitive Status: Within Functional Limits for tasks assessed                      General Comments      Exercises        Assessment/Plan    PT Assessment Patient needs continued PT services  PT Diagnosis Difficulty walking;Generalized weakness   PT Problem List Decreased strength;Decreased range of motion;Decreased activity tolerance;Decreased balance;Decreased mobility;Decreased knowledge of use of DME;Decreased safety awareness;Decreased knowledge of precautions  PT Treatment Interventions DME instruction;Gait training;Functional mobility training;Stair training;Therapeutic activities;Therapeutic exercise;Neuromuscular re-education;Patient/family education   PT Goals (Current goals can be found in the Care Plan section) Acute Rehab PT Goals Patient Stated Goal: To recover his clothes from previous hospital room as pt believes his sister did not pack them with the rest of his things when he moved tot his unit.  PT Goal Formulation: With patient Time For Goal Achievement: 09/18/13 Potential to Achieve Goals: Fair    Frequency Min 3X/week   Barriers to discharge        Co-evaluation               End of Session Equipment Utilized During Treatment: Gait belt Activity Tolerance: Patient tolerated treatment well Patient left: in bed;with call bell/phone within reach;with chair alarm set;with nursing/sitter in room Nurse Communication: Mobility status         Time: 1610-9604 PT Time Calculation (min): 29 min   Charges:   PT Evaluation $Initial PT Evaluation Tier I: 1 Procedure PT Treatments $Therapeutic Activity: 23-37 mins   PT G CodesRuthann Cancer 09/11/2013, 2:35 PM  Ruthann Cancer, PT, DPT Acute Rehabilitation Services Pager: [phone]

## 2013-09-11 NOTE — H&P (View-Only) (Signed)
[facility] CONSULT [place]   MRN : [id number]  Reason for Consult: Dialysis access ESRD Referring Physician: Trevor IhaJames L Deterding, MD   History of Present Illness: 58 y/o male with CKD stage 5.  We have been asked to provide dialysis access.  His current Cr is 4.8.  Past medical history includes CVA with left hemiparesis, CHF, hypertension treated with carvedilol and Norvasc, hypercholesterolemia treated with Lipitor and DM treated with insulin.  When the patient was admitted, he had blood sugars in the 300s yesterday.  This was associated with polyuria.  The patient has just moved degrees per from [place].  He reports that he has had vein mapping in [place]   Current Facility-Administered Medications  Medication Dose Route Frequency Provider Last Rate Last Dose  . 0.9 %  sodium chloride infusion   Intravenous Continuous Adeline C Viyuoh, MD      . amLODipine (NORVASC) tablet 10 mg  10 mg Oral Daily Lynden OxfordPranav Patel, MD   10 mg at 09/09/13 1023  . aspirin EC tablet 81 mg  81 mg Oral Daily Lynden OxfordPranav Patel, MD   81 mg at 09/09/13 1023  . atorvastatin (LIPITOR) tablet 80 mg  80 mg Oral Daily Lynden OxfordPranav Patel, MD   80 mg at 09/09/13 1023  . carvedilol (COREG) tablet 12.5 mg  12.5 mg Oral BID WC Lynden OxfordPranav Patel, MD   12.5 mg at 09/09/13 0809  . dextrose 5 %-0.45 % sodium chloride infusion   Intravenous Continuous Kela MillinAdeline C Viyuoh, MD 75 mL/hr at 09/09/13 1413    . dextrose 50 % solution 25 mL  25 mL Intravenous PRN Lynden OxfordPranav Patel, MD      . dextrose 50 % solution 25 mL  25 mL Intravenous PRN Adeline C Viyuoh, MD      . hydrALAZINE (APRESOLINE) tablet 50 mg  50 mg Oral TID Lynden OxfordPranav Patel, MD   50 mg at 09/09/13 1023  . insulin aspart (novoLOG) injection 0-15 Units  0-15 Units Subcutaneous TID WC Lynden OxfordPranav Patel, MD   15 Units at 09/09/13 1210  . insulin aspart (novoLOG) injection 0-5 Units  0-5 Units Subcutaneous QHS Lynden OxfordPranav Patel, MD      . insulin glargine (LANTUS) injection 7 Units  7  Units Subcutaneous QHS Lynden OxfordPranav Patel, MD      . insulin regular (NOVOLIN R,HUMULIN R) 1 Units/mL in sodium chloride 0.9 % 100 mL infusion   Intravenous Continuous Kela MillinAdeline C Viyuoh, MD      . Melene Muller[START ON 09/10/2013] pneumococcal 23 valent vaccine (PNU-IMMUNE) injection 0.5 mL  0.5 mL Intramuscular Tomorrow-1000 Lynden OxfordPranav Patel, MD        Pt meds include: Statin :Yes Betablocker: Yes ASA: Yes Other anticoagulants/antiplatelets:   Past Medical History  Diagnosis Date  . ESRD (end stage renal disease)   . Hypertension   . Diabetes mellitus without complication   . CHF (congestive heart failure)   . Stroke   . Seizures     History reviewed. No pertinent past surgical history.  Social History History  Substance Use Topics  . Smoking status: Current Every Day Smoker -- 0.50 packs/day    Types: Cigarettes  . Smokeless tobacco: Not on file  . Alcohol Use: No    Family History History reviewed. No pertinent family history.Mother HTN, CHF  Allergies  Allergen Reactions  . Pork-Derived Products     unknown     REVIEW OF SYSTEMS   Negative review of systems  General: [ ]  Weight loss, [ ]   Fever, [ ]  chills Neurologic: [ ]  Dizziness, [ ]  Blackouts, [ ]  Seizure [ ]  Stroke, [ ]  "Mini stroke", [ ]  Slurred speech, [ ]  Temporary blindness; [ ]  weakness in arms or legs, [ ]  Hoarseness [ ]  Dysphagia Cardiac: [ ]  Chest pain/pressure, [ ]  Shortness of breath at rest [ ]  Shortness of breath with exertion, [ ]  Atrial fibrillation or irregular heartbeat  Vascular: [ ]  Pain in legs with walking, [ ]  Pain in legs at rest, [ ]  Pain in legs at night,  [ ]  Non-healing ulcer, [ ]  Blood clot in vein/DVT,   Pulmonary: [ ]  Home oxygen, [ ]  Productive cough, [ ]  Coughing up blood, [ ]  Asthma,  [ ]  Wheezing [ ]  COPD Musculoskeletal:  [ ]  Arthritis, [ ]  Low back pain, [ ]  Joint pain Hematologic: [ ]  Easy Bruising, [ ]  Anemia; [ ]  Hepatitis Gastrointestinal: [ ]  Blood in stool, [ ]  Gastroesophageal  Reflux/heartburn, Urinary: [ ]  chronic Kidney disease, [ ]  on HD - [ ]  MWF or [ ]  TTHS, [ ]  Burning with urination, [ ]  Difficulty urinating Skin: [ ]  Rashes, [ ]  Wounds Psychological: [ ]  Anxiety, [ ]  Depression  Physical Examination Filed Vitals:   09/09/13 0115 09/09/13 0335 09/09/13 0734 09/09/13 1207  BP: 127/89 125/71 169/84   Pulse:  60 64   Temp:  97.3 F (36.3 C) 97.8 F (36.6 C) 97.8 F (36.6 C)  TempSrc:  Oral Oral Oral  Resp:  13 18   Height:      Weight:      SpO2:  100% 99%    Body mass index is 16.36 kg/(m^2).  General:  WDWN in NAD Gait: Normal HENT: WNL Eyes: Pupils equal Pulmonary: normal non-labored breathing , without Rales, rhonchi,  wheezing Cardiac: RRR, without  Murmurs, rubs or gallops; No carotid bruits Abdomen: soft, NT, no masses Skin: no rashes, ulcers noted;  no Gangrene , no cellulitis; no open wounds;   Vascular Exam/Pulses:Palpable radial , brachial and DP pulses bilaterally.   Musculoskeletal: left LE muscle wasting or atrophy; no edema  Neurologic: A&O X 3; Appropriate Affect ;  SENSATION: normal; MOTOR FUNCTION: 5/5 right upper and left, and wekkness with decreased motor left LE Speech is fluent/normal   Significant Diagnostic Studies: CBC Lab Results  Component Value Date   WBC 4.0 09/08/2013   HGB 12.4* 09/08/2013   HCT 38.5* 09/08/2013   MCV 81.1 09/08/2013   PLT 164 09/08/2013    BMET    Component Value Date/Time   NA 136* 09/09/2013 1110   K 4.2 09/09/2013 1110   CL 103 09/09/2013 1110   CO2 20 09/09/2013 1110   GLUCOSE 287* 09/09/2013 1110   BUN 79* 09/09/2013 1110   CREATININE 4.80* 09/09/2013 1110   CALCIUM 8.3* 09/09/2013 1110   GFRNONAA 12* 09/09/2013 1110   GFRAA 14* 09/09/2013 1110   Estimated Creatinine Clearance: 11.4 ml/min (by C-G formula based on Cr of 4.8).  COAG No results found for this basename: INR, PROTIME     Non-Invasive Vascular Imaging: pending vein mapping  ASSESSMENT/PLAN:  CKD stage 5 in  need of permanent dialysis access. He is not currently on and never has had dialysis in the past. We will plan AV fistula versed graft placement after results are reviewed of the vein mapping. Plan procedure this week.    Clinton Gallant [facility] 09/09/2013 2:18 PM  I agree with the above.  The patient has been seen and examined.  The patient has stage IV chronic renal insufficiency.  He is in need of permanent access.  Vein mapping is pending at this time.  The patient is right handed.  He has a palpable left brachial and left radial pulse.  I suspect that we will proceed with left arm AV fistula, pending the results of his vein mapping.  This will most likely be performed on Thursday.  Durene Cal

## 2013-09-11 NOTE — Interval H&P Note (Signed)
History and Physical Interval Note:  09/11/2013 10:53 AM  [name] [name]  has presented today for surgery, with the diagnosis of End stage renal disease  The various methods of treatment have been discussed with the patient and family. After consideration of risks, benefits and other options for treatment, the patient has consented to  Procedure(s): ARTERIOVENOUS (AV) FISTULA CREATION (Left) as a surgical intervention .  The patient's history has been reviewed, patient examined, no change in status, stable for surgery.  I have reviewed the patient's chart and labs.  Questions were answered to the patient's satisfaction.     Jeffre Enriques S

## 2013-09-11 NOTE — Progress Notes (Addendum)
[facility] PROGRESS NOTE  Elisabeth MostClarence Mittleman ZOX:[id number] DOB: [birth year] DOA: 09/08/2013 PCP: No primary provider on file.  Assessment/Plan: DKA/uncontrolled diabetes mellitus -Anion gap will be slightly up due to his CKD5 and cannot be completely normal in this setting -will transition to lantus again, was started back on Insulin gtt per night coverage, SSI -increase QHS lantus dose and add novolog meal coverage -Hbaic 11.4 -DM coordinator consult  CKD stage 5 -[place] moved from [place],  -renal following -s/p Vein mapping and AVF today -started on lasix and bicarb per Renal  Hypertension  -Continue hydralazine Coreg and Norvasc  History of CHF (congestive heart failure)  -Compensated -lasix restarted, check ECHO for baseline EF  H/O CVA (cerebrovascular accident) -Continue aspirin and Lipitor  Hepatitis C -positive serology will d/w pt   Code Status: full Family Communication: None at bedside, called and updated brother 8/12 Disposition Plan: Tx to floor  Consultants:  renal  Procedures:  none  Antibiotics:  none  HPI/Subjective: Denies any complaints, wanting to eat, cannot wait for OR  Objective: Filed Vitals:   09/11/13 1029  BP:   Pulse: 62  Temp:   Resp:     Intake/Output Summary (Last 24 hours) at 09/11/13 1040 Last data filed at 09/11/13 0900  Gross per 24 hour  Intake    360 ml  Output   1350 ml  Net   -990 ml   Filed Weights   09/10/13 0500 09/10/13 1544 09/11/13 0525  Weight: 47.7 kg (105 lb 2.6 oz) 48.5 kg (106 lb 14.8 oz) 48.217 kg (106 lb 4.8 oz)    Exam:  General: alert & oriented x 3 In NAD HEENT: unable to visualize any abnormality in the ear Cardiovascular: RRR, nl S1 s2 Respiratory: Decreased breath sounds at the bases, no crackles or wheezes Abdomen: soft +BS NT/ND, no masses palpable Extremities: No cyanosis and no edema     Data Reviewed: Basic Metabolic Panel:  Recent Labs Lab 09/10/13 0248  09/10/13 0717 09/10/13 0958 09/10/13 2319 09/11/13 0512 09/11/13 0916  NA 133* 135* 135* 132* 135* 138  K 4.0 4.0 3.8 4.2 3.9 3.7  CL 103 103 103 101 104 106  CO2 18* 20 20 17* 17* 17*  GLUCOSE 136* 136* 158* 415* 215* 83  BUN 74* 74* 72* 73* 70* 67*  CREATININE 4.54* 4.54* 4.39* 4.19* 4.07* 4.00*  CALCIUM 8.0* 8.2* 8.3* 8.3* 8.5 8.6  PHOS  --  4.0  --   --  4.2  --    Liver Function Tests:  Recent Labs Lab 09/08/13 2050 09/10/13 0717 09/11/13 0512  AST 58* 74*  --   ALT 49 41  --   ALKPHOS 126* 92  --   BILITOT 0.3 0.3  --   PROT 8.3 7.0  --   ALBUMIN 3.2* 2.6* 2.5*   No results found for this basename: LIPASE, AMYLASE,  in the last 168 hours No results found for this basename: AMMONIA,  in the last 168 hours CBC:  Recent Labs Lab 09/08/13 2050 09/10/13 0717 09/11/13 0536  WBC 4.0 6.0 4.5  NEUTROABS 2.6 3.4  --   HGB 12.4* 11.4* 11.5*  HCT 38.5* 34.9* 35.0*  MCV 81.1 79.1 78.7  PLT 164 152 163   Cardiac Enzymes: No results found for this basename: CKTOTAL, CKMB, CKMBINDEX, TROPONINI,  in the last 168 hours BNP (last 3 results) No results found for this basename: PROBNP,  in the last 8760 hours CBG:  Recent Labs Lab 09/11/13  1610 09/11/13 0734 09/11/13 0827 09/11/13 0926 09/11/13 1027  GLUCAP 117* 112* 108* 104* 124*    Recent Results (from the past 240 hour(s))  MRSA PCR SCREENING     Status: None   Collection Time    09/09/13  1:57 AM      Result Value Ref Range Status   MRSA by PCR NEGATIVE  NEGATIVE Final   Comment:            The GeneXpert MRSA Assay (FDA     approved for NASAL specimens     only), is one component of a     comprehensive MRSA colonization     surveillance program. It is not     intended to diagnose MRSA     infection nor to guide or     monitor treatment for     MRSA infections.     Studies: US Renal  09/09/2013   CLINICAL DATA:  Elevated renal function studies ; end-stage renal disease.  EXAM: RENAL/URINARY TRACT  ULTRASOUND COMPLETE  COMPARISON:  None.  FINDINGS: Right Kidney:  Length: 10.3 cm. The echotexture is increased and is equal to or slightly greater than that of the adjacent liver. There is no hydronephrosis or focal mass.  Left Kidney:  Length: 10 cm. Increased echotexture is noted diffusely. There is no hydronephrosis or focal mass.  Bladder:  Appears normal for degree of bladder distention.  IMPRESSION: Bilaterally increased cortical echotexture consistent with medical renal disease. There is no evidence of obstruction.   Electronically Signed   By: David  [place]   On: 09/09/2013 21:07    Scheduled Meds: . amLODipine  10 mg Oral Daily  . aspirin EC  81 mg Oral Daily  . atorvastatin  80 mg Oral Daily  . carvedilol  12.5 mg Oral BID WC  . cefUROXime (ZINACEF)  IV  1.5 g Intravenous On Call to OR  . feeding supplement (NEPRO CARB STEADY)  237 mL Oral TID BM  . furosemide  80 mg Oral Daily  . hydrALAZINE  50 mg Oral TID  . pneumococcal 23 valent vaccine  0.5 mL Intramuscular Tomorrow-1000  . sodium bicarbonate  1,300 mg Oral BID   Continuous Infusions: . dextrose 5 % and 0.45% NaCl 75 mL/hr (09/11/13 0532)  . insulin (NOVOLIN-R) infusion 4.2 Units/hr (09/11/13 0531)    Principal Problem:   Hyperglycemic crisis in diabetes mellitus Active Problems:   ESRD (end stage renal disease)   Hypertension   CHF (congestive heart failure)   Seizures   H/O: CVA (cerebrovascular accident)    Time spent: >94min    [facility]  [facility] Pager [phone]. If 7PM-7AM, please contact night-coverage at [email or web address], password [facility] 09/11/2013, 10:40 AM  LOS: 3 days

## 2013-09-11 NOTE — Anesthesia Preprocedure Evaluation (Addendum)
Anesthesia Evaluation  Patient identified by MRN, date of birth, ID band Patient awake    Reviewed: Allergy & Precautions, H&P , NPO status , Patient's Chart, lab work & pertinent test results  Airway Mallampati: II TM Distance: >3 FB Neck ROM: Full    Dental  (+) Edentulous Upper, Edentulous Lower   Pulmonary Current Smoker,    + decreased breath sounds      Cardiovascular hypertension, Pt. on medications +CHF Rhythm:Regular Rate:Normal     Neuro/Psych CVA    GI/Hepatic   Endo/Other  diabetes  Renal/GU ESRFRenal disease     Musculoskeletal   Abdominal   Peds  Hematology   Anesthesia Other Findings   Reproductive/Obstetrics                          Anesthesia Physical Anesthesia Plan  ASA: III  Anesthesia Plan: MAC   Post-op Pain Management:    Induction:   Airway Management Planned: Natural Airway and Simple Face Mask  Additional Equipment:   Intra-op Plan:   Post-operative Plan:   Informed Consent: I have reviewed the patients History and Physical, chart, labs and discussed the procedure including the risks, benefits and alternatives for the proposed anesthesia with the patient or authorized representative who has indicated his/her understanding and acceptance.     Plan Discussed with: CRNA and Anesthesiologist  Anesthesia Plan Comments:         Anesthesia Quick Evaluation

## 2013-09-11 NOTE — Progress Notes (Signed)
Pt. With elevated CBG this am. On call NP, K. Schorr, made aware. New orders received and implemented. RN will continue to monitor pt. For changes in condition. [name] [name], Cheryll DessertKaren Cherrell

## 2013-09-11 NOTE — Transfer of Care (Signed)
Immediate Anesthesia Transfer of Care Note  Patient: [name] [name]  Procedure(s) Performed: Procedure(s): Insertion of arteriovenous gortex graft (Left)  Patient Location: PACU  Anesthesia Type:MAC  Level of Consciousness: sedated  Airway & Oxygen Therapy: Patient Spontanous Breathing and Patient connected to nasal cannula oxygen  Post-op Assessment: Report given to PACU RN and Post -op Vital signs reviewed and stable  Post vital signs: Reviewed and stable  Complications: No apparent anesthesia complications

## 2013-09-12 ENCOUNTER — Encounter (HOSPITAL_COMMUNITY): Payer: Self-pay | Admitting: Vascular Surgery

## 2013-09-12 DIAGNOSIS — E1310 Other specified diabetes mellitus with ketoacidosis without coma: Secondary | ICD-10-CM

## 2013-09-12 DIAGNOSIS — E1110 Type 2 diabetes mellitus with ketoacidosis without coma: Secondary | ICD-10-CM | POA: Diagnosis present

## 2013-09-12 DIAGNOSIS — E43 Unspecified severe protein-calorie malnutrition: Secondary | ICD-10-CM | POA: Diagnosis present

## 2013-09-12 DIAGNOSIS — I509 Heart failure, unspecified: Secondary | ICD-10-CM

## 2013-09-12 LAB — RENAL FUNCTION PANEL
Albumin: 2.4 g/dL — ABNORMAL LOW (ref 3.5–5.2)
Anion gap: 14 (ref 5–15)
BUN: 64 mg/dL — AB (ref 6–23)
CHLORIDE: 106 meq/L (ref 96–112)
CO2: 16 mEq/L — ABNORMAL LOW (ref 19–32)
CREATININE: 4.14 mg/dL — AB (ref 0.50–1.35)
Calcium: 8.2 mg/dL — ABNORMAL LOW (ref 8.4–10.5)
GFR calc Af Amer: 17 mL/min — ABNORMAL LOW (ref >90)
GFR calc non Af Amer: 15 mL/min — ABNORMAL LOW (ref >90)
Glucose, Bld: 315 mg/dL — ABNORMAL HIGH (ref 70–99)
PHOSPHORUS: 5.5 mg/dL — AB (ref 2.3–4.6)
Potassium: 3.9 mEq/L (ref 3.7–5.3)
Sodium: 136 mEq/L — ABNORMAL LOW (ref 137–147)

## 2013-09-12 LAB — GLUCOSE, CAPILLARY
GLUCOSE-CAPILLARY: 285 mg/dL — AB (ref 70–99)
Glucose-Capillary: 217 mg/dL — ABNORMAL HIGH (ref 70–99)

## 2013-09-12 LAB — UIFE/LIGHT CHAINS/TP QN, 24-HR UR
ALBUMIN, U: DETECTED
Alpha 1, Urine: DETECTED — AB
Alpha 2, Urine: DETECTED — AB
BETA UR: DETECTED — AB
FREE LAMBDA LT CHAINS, UR: 13.2 mg/dL — AB (ref 0.02–0.67)
Free Kappa Lt Chains,Ur: 60 mg/dL — ABNORMAL HIGH (ref 0.14–2.42)
Free Kappa/Lambda Ratio: 4.55 ratio (ref 2.04–10.37)
Gamma Globulin, Urine: DETECTED — AB
Total Protein, Urine: 231.1 mg/dL

## 2013-09-12 LAB — CLOSTRIDIUM DIFFICILE BY PCR: Toxigenic C. Difficile by PCR: NEGATIVE

## 2013-09-12 MED ORDER — SODIUM BICARBONATE 650 MG PO TABS
1300.0000 mg | ORAL_TABLET | Freq: Three times a day (TID) | ORAL | Status: DC
  Administered 2013-09-12: 1300 mg via ORAL
  Filled 2013-09-12 (×3): qty 2

## 2013-09-12 MED ORDER — INSULIN ASPART 100 UNIT/ML ~~LOC~~ SOLN: 3.0000 [IU] | Freq: Three times a day (TID) | SUBCUTANEOUS | Status: DC

## 2013-09-12 MED ORDER — FUROSEMIDE 80 MG PO TABS: 80.0000 mg | ORAL_TABLET | Freq: Every day | ORAL | Status: DC

## 2013-09-12 MED ORDER — CALCITRIOL 0.25 MCG PO CAPS: 0.2500 ug | ORAL_CAPSULE | Freq: Every day | ORAL | Status: AC

## 2013-09-12 MED ORDER — SODIUM BICARBONATE 650 MG PO TABS: 1300.0000 mg | ORAL_TABLET | Freq: Three times a day (TID) | ORAL | Status: DC

## 2013-09-12 MED ORDER — OXYCODONE-ACETAMINOPHEN 5-325 MG PO TABS: 1.0000 | ORAL_TABLET | Freq: Four times a day (QID) | ORAL | Status: DC | PRN

## 2013-09-12 MED ORDER — INSULIN GLARGINE 100 UNIT/ML ~~LOC~~ SOLN: 12.0000 [IU] | Freq: Every day | SUBCUTANEOUS | Status: DC

## 2013-09-12 MED ORDER — CALCITRIOL 0.25 MCG PO CAPS
0.2500 ug | ORAL_CAPSULE | Freq: Every day | ORAL | Status: DC
  Administered 2013-09-12: 0.25 ug via ORAL
  Filled 2013-09-12: qty 1

## 2013-09-12 NOTE — Discharge Summary (Signed)
Physician Discharge Summary  [name] [name] WUJ:[id number] DOB: [birth year] DOA: 09/08/2013  PCP: No primary provider on file.  Admit date: 09/08/2013 Discharge date: 09/12/2013  Time spent: 45 minutes  Recommendations for Outpatient Follow-up:  1. PCP Dr.Garba 8/19, please FU ECHO report and needs ID referral 2. Dr.Deterding 9/14 at 9:30 3. Dr.Dickson in 2 weeks 4. Outpt ID referral for Hepatitis C  Discharge Diagnoses:    DKA   Hypertension   CHF (congestive heart failure)   Anemia of chronic disease   H/O: CVA (cerebrovascular accident)   Protein-calorie malnutrition, severe   DM-brittle   Positive Hepatitis C serology  Discharge Condition: stable  Diet recommendation: renal diabetic  Filed Weights   09/10/13 1544 09/11/13 0525 09/12/13 0708  Weight: 48.5 kg (106 lb 14.8 oz) 48.217 kg (106 lb 4.8 oz) 48.4 kg (106 lb 11.2 oz)    History of present illness:  [name] [name] is a 58 y.o. male with Past medical history of Diabetes mellitus, end-stage renal disease, hypertension, CHF, history of CVA.  Patient presented with complaints of high blood sugar. He mentioned that he is from [place], [place], where he was admitted a few times at [facility]. He was recently admitted and was discharged to a nursing home from where he was discharged on 08/07/2013. Then He moved to [place] to live with his sister. For 1day,  the patient has noted that his sugar has been running above 600 daily for his sugars Running in 300. after his recent hospitalization His insulin was reduced. He reported compliance with nsulin. He denied any nausea vomiting or abdominal pain.   Hospital Course:  DKA/uncontrolled diabetes mellitus  -improved -also has metabolic acidosis related to CKD -transitioned to lantus from Insulin gtta -increased QHS lantus dose and continued novolog meal coverage  -Hbaic 11.4  -DM education completed, seen by DM coordinator too  CKD stage 5  -Just moved from [place],  -Seen by Renal in consultation -s/p Vein mapping and AVF 8/13 -FU with Renal and VVS -stable without symptoms of uremia or volume overload  -started on lasix and bicarb per Renal recommendations -Diet education completed  Hypertension  -Continue hydralazine Coreg and Norvasc   History of CHF (congestive heart failure)  -Compensated  -lasix restarted, ECHO done for baseline EF  -please FU ECHO report, pending at time of DC  H/O CVA (cerebrovascular accident)  -Continue aspirin and Lipitor   Hepatitis C  -positive serology  -will need referral to ID, per PCP   Procedures:  AVF placed 8/13 per Dr.DIckson  Consultations:  Renal   VVS  Discharge Exam: Filed Vitals:   09/12/13 0708  BP: 129/60  Pulse: 64  Temp: 97.5 F (36.4 C)  Resp: 18    General: AAOx3 Cardiovascular: S1S2/RRR Respiratory: CTAB  Discharge Instructions You were cared for by a hospitalist during your hospital stay. If you have any questions about your discharge medications or the care you received while you were in the hospital after you are discharged, you can call the unit and asked to speak with the hospitalist on call if the hospitalist that took care of you is not available. Once you are discharged, your primary care physician will handle any further medical issues. Please note that NO REFILLS for any discharge medications will be authorized once you are discharged, as it is imperative that you return to your primary care physician (or establish a relationship with a primary care physician if you do not have one) for your  aftercare needs so that they can reassess your need for medications and monitor your lab values.  Discharge Instructions   Discharge instructions    Complete by:  As directed   Renal Diabetic diet     Increase activity slowly    Complete by:  As directed             Medication List    STOP taking these medications       CALCIUM CARBONATE PO     epoetin alfa  20000 UNIT/ML injection  Commonly known as:  EPOGEN,PROCRIT      TAKE these medications       amLODipine 10 MG tablet  Commonly known as:  NORVASC  Take 10 mg by mouth daily.     aspirin 81 MG tablet  Take 81 mg by mouth daily.     atorvastatin 80 MG tablet  Commonly known as:  LIPITOR  Take 80 mg by mouth daily.     calcitRIOL 0.25 MCG capsule  Commonly known as:  ROCALTROL  Take 1 capsule (0.25 mcg total) by mouth daily.     carvedilol 12.5 MG tablet  Commonly known as:  COREG  Take 12.5 mg by mouth 2 (two) times daily with a meal.     cholecalciferol 1000 UNITS tablet  Commonly known as:  VITAMIN D  Take 1,000 Units by mouth every morning.     diphenhydrAMINE 25 MG tablet  Commonly known as:  BENADRYL  Take 25 mg by mouth every 6 (six) hours as needed for itching.     furosemide 80 MG tablet  Commonly known as:  LASIX  Take 1 tablet (80 mg total) by mouth daily.     hydrALAZINE 50 MG tablet  Commonly known as:  APRESOLINE  Take 50 mg by mouth 3 (three) times daily.     insulin aspart 100 UNIT/ML injection  Commonly known as:  novoLOG  Inject 5 Units into the skin 3 (three) times daily before meals.     insulin glargine 100 UNIT/ML injection  Commonly known as:  LANTUS  Inject 0.12 mLs (12 Units total) into the skin at bedtime.     oxyCODONE-acetaminophen 5-325 MG per tablet  Commonly known as:  PERCOCET/ROXICET  Take 1-2 tablets by mouth every 6 (six) hours as needed for severe pain.     sodium bicarbonate 650 MG tablet  Take 2 tablets (1,300 mg total) by mouth 3 (three) times daily.     thiamine 100 MG tablet  Commonly known as:  VITAMIN B-1  Take 100 mg by mouth every morning.       Allergies  Allergen Reactions  . Pork-Derived Products     unknown       Follow-up Information   Follow up with [facility]. (RN/PT)    Contact information:   [address] [place] [address] [phone]       Follow up with  Lonia BloodGARBA,LAWAL, MD On 09/17/2013. (Wednesday @ 2:15 PM per Lanora ManisElizabeth please fax a copy of discharge orders to the office at [phone]    Specialty:  Internal Medicine   Contact information:   [address]  [place] [address] [phone]        The results of significant diagnostics from this hospitalization (including imaging, microbiology, ancillary and laboratory) are listed below for reference.    Significant Diagnostic Studies: [facility]  09/09/2013   CLINICAL DATA:  Elevated renal function studies ; end-stage renal disease.  EXAM: RENAL/URINARY TRACT ULTRASOUND COMPLETE  COMPARISON:  None.  FINDINGS: Right Kidney:  Length: 10.3 cm. The echotexture is increased and is equal to or slightly greater than that of the adjacent liver. There is no hydronephrosis or focal mass.  Left Kidney:  Length: 10 cm. Increased echotexture is noted diffusely. There is no hydronephrosis or focal mass.  Bladder:  Appears normal for degree of bladder distention.  IMPRESSION: Bilaterally increased cortical echotexture consistent with medical renal disease. There is no evidence of obstruction.   Electronically Signed   By: David  [place]   On: 09/09/2013 21:07    Microbiology: Recent Results (from the past 240 hour(s))  MRSA PCR SCREENING     Status: None   Collection Time    09/09/13  1:57 AM      Result Value Ref Range Status   MRSA by PCR NEGATIVE  NEGATIVE Final   Comment:            The GeneXpert MRSA Assay (FDA     approved for NASAL specimens     only), is one component of a     comprehensive MRSA colonization     surveillance program. It is not     intended to diagnose MRSA     infection nor to guide or     monitor treatment for     MRSA infections.  SURGICAL PCR SCREEN     Status: Abnormal   Collection Time    09/11/13 11:17 AM      Result Value Ref Range Status   MRSA, PCR NEGATIVE  NEGATIVE Final   Staphylococcus aureus POSITIVE (*) NEGATIVE Final   Comment:            The  Xpert SA Assay (FDA     approved for NASAL specimens     in patients over 49 years of age),     is one component of     a comprehensive surveillance     program.  Test performance has     been validated by [employer] for patients greater     than or equal to 1 year old.     It is not intended     to diagnose infection nor to     guide or monitor treatment.  CLOSTRIDIUM DIFFICILE BY PCR     Status: None   Collection Time    09/11/13 10:26 PM      Result Value Ref Range Status   C difficile by pcr NEGATIVE  NEGATIVE Final     Labs: Basic Metabolic Panel:  Recent Labs Lab 09/10/13 0248 09/10/13 0717 09/10/13 0958 09/10/13 2319 09/11/13 0512 09/11/13 0916 09/12/13 0421  NA 133* 135* 135* 132* 135* 138 136*  K 4.0 4.0 3.8 4.2 3.9 3.7 3.9  CL 103 103 103 101 104 106 106  CO2 18* 20 20 17* 17* 17* 16*  GLUCOSE 136* 136* 158* 415* 215* 83 315*  BUN 74* 74* 72* 73* 70* 67* 64*  CREATININE 4.54* 4.54* 4.39* 4.19* 4.07* 4.00* 4.14*  CALCIUM 8.0* 8.2* 8.3* 8.3* 8.5 8.6 8.2*  PHOS  --  4.0  --   --  4.2  --  5.5*   Liver Function Tests:  Recent Labs Lab 09/08/13 2050 09/10/13 0717 09/11/13 0512 09/12/13 0421  AST 58* 74*  --   --   ALT 49 41  --   --   ALKPHOS 126* 92  --   --  BILITOT 0.3 0.3  --   --   PROT 8.3 7.0  --   --   ALBUMIN 3.2* 2.6* 2.5* 2.4*   No results found for this basename: LIPASE, AMYLASE,  in the last 168 hours No results found for this basename: AMMONIA,  in the last 168 hours CBC:  Recent Labs Lab 09/08/13 2050 09/10/13 0717 09/11/13 0536  WBC 4.0 6.0 4.5  NEUTROABS 2.6 3.4  --   HGB 12.4* 11.4* 11.5*  HCT 38.5* 34.9* 35.0*  MCV 81.1 79.1 78.7  PLT 164 152 163   Cardiac Enzymes: No results found for this basename: CKTOTAL, CKMB, CKMBINDEX, TROPONINI,  in the last 168 hours BNP: BNP (last 3 results) No results found for this basename: PROBNP,  in the last 8760 hours CBG:  Recent Labs Lab 09/11/13 1341 09/11/13 1418  09/11/13 1609 09/11/13 2052 09/12/13 0700  GLUCAP 117* 160* 165* 361* 285*       Signed:  Scorpio Fortin  [facility] 09/12/2013, 11:52 AM

## 2013-09-12 NOTE — Progress Notes (Signed)
Subjective: Interval History: has complaints cold food.  Objective: Vital signs in last 24 hours: Temp:  [97 F (36.1 C)-98 F (36.7 C)] 97.5 F (36.4 C) (08/14 0708) Pulse Rate:  [53-67] 64 (08/14 0708) Resp:  [12-18] 18 (08/14 0708) BP: (107-158)/(56-86) 129/60 mmHg (08/14 0708) SpO2:  [99 %-100 %] 99 % (08/14 0708) Weight:  [48.4 kg (106 lb 11.2 oz)] 48.4 kg (106 lb 11.2 oz) (08/14 0708) Weight change:   Intake/Output from previous day: 08/13 0701 - 08/14 0700 In: 540 [P.O.:320; I.V.:220] Out: 576 [Urine:575; Stool:1] Intake/Output this shift: Total I/O In: -  Out: 350 [Urine:350]  General appearance: alert, cooperative and cachectic Resp: diminished breath sounds bilaterally and rhonchi bibasilar Cardio: S1, S2 normal and systolic murmur: systolic ejection 2/6, decrescendo at 2nd left intercostal space GI: pos bs,soft Extremities: AVG LUA , B&T  Lab Results:  Recent Labs  09/10/13 0717 09/11/13 0536  WBC 6.0 4.5  HGB 11.4* 11.5*  HCT 34.9* 35.0*  PLT 152 163   BMET:  Recent Labs  09/11/13 0916 09/12/13 0421  NA 138 136*  K 3.7 3.9  CL 106 106  CO2 17* 16*  GLUCOSE 83 315*  BUN 67* 64*  CREATININE 4.00* 4.14*  CALCIUM 8.6 8.2*    Recent Labs  09/10/13 1700  PTH 70.3   Iron Studies:  Recent Labs  09/09/13 1440  IRON 15*  TIBC 317    Studies/Results: No results found.  I have reviewed the patient's current medications.  Assessment/Plan: 1  CKD 5 Access in . Some acidemia, ^ bicarb, will F/u in office 2 HTN controlled 3 anemia Given Ge and epo 4 HPTH Vit D 5 DM per primary 6 CVA P ^ bicarb, exercise arm, bp meds, epo, Vit D    LOS: 4 days   [name] [name] L 09/12/2013,8:18 AM

## 2013-09-12 NOTE — Progress Notes (Signed)
   VASCULAR SURGERY ASSESSMENT & PLAN:  * 1 Day Post-Op s/p: Left upper arm AVG.  * Graft working well. Palpable radial pulse.  * Vascular will be available if needed.     SUBJECTIVE: Comfortable  PHYSICAL EXAM: Filed Vitals:   09/11/13 1718 09/11/13 1719 09/11/13 2055 09/12/13 0344  BP: 107/56  113/58 141/75  Pulse: 62 60 59 67  Temp: 98 F (36.7 C)  97 F (36.1 C)   TempSrc: Oral  Oral   Resp: 18  18   Height:      Weight:      SpO2: 100%  99%    Good thrill in Left AVG Palpable left radial pulse Incisions look good.    Recent Labs  09/11/13 1418 09/11/13 1609 09/11/13 2052  GLUCAP 160* 165* [address]*   Chris Dickson Beeper: [phone] 09/12/2013

## 2013-09-12 NOTE — Progress Notes (Signed)
  Echocardiogram 2D Echocardiogram has been performed.  Leta JunglingCooper, Kalyani Maeda M 09/12/2013, 10:15 AM

## 2013-09-12 NOTE — Progress Notes (Signed)
Pt had episode of 2nd degree type 2, then back to NSR- 1 degree AV block, no S/S, MD notified; will continue to monitor, Thanks, Lavonda JumboMike F  RN

## 2013-09-28 ENCOUNTER — Encounter (HOSPITAL_COMMUNITY): Payer: Self-pay | Admitting: Emergency Medicine

## 2013-09-28 ENCOUNTER — Emergency Department (HOSPITAL_COMMUNITY)
Admission: EM | Admit: 2013-09-28 | Discharge: 2013-09-28 | Disposition: A | Discharge status: Home / Self Care | Payer: Medicare Other | Attending: Emergency Medicine | Admitting: Emergency Medicine

## 2013-09-28 DIAGNOSIS — Z794 Long term (current) use of insulin: Secondary | ICD-10-CM | POA: Insufficient documentation

## 2013-09-28 DIAGNOSIS — Z7982 Long term (current) use of aspirin: Secondary | ICD-10-CM | POA: Diagnosis not present

## 2013-09-28 DIAGNOSIS — Z8669 Personal history of other diseases of the nervous system and sense organs: Secondary | ICD-10-CM | POA: Insufficient documentation

## 2013-09-28 DIAGNOSIS — E119 Type 2 diabetes mellitus without complications: Secondary | ICD-10-CM | POA: Insufficient documentation

## 2013-09-28 DIAGNOSIS — Z79899 Other long term (current) drug therapy: Secondary | ICD-10-CM | POA: Insufficient documentation

## 2013-09-28 DIAGNOSIS — I509 Heart failure, unspecified: Secondary | ICD-10-CM | POA: Diagnosis not present

## 2013-09-28 DIAGNOSIS — N186 End stage renal disease: Secondary | ICD-10-CM | POA: Diagnosis not present

## 2013-09-28 DIAGNOSIS — Z8673 Personal history of transient ischemic attack (TIA), and cerebral infarction without residual deficits: Secondary | ICD-10-CM | POA: Insufficient documentation

## 2013-09-28 DIAGNOSIS — I120 Hypertensive chronic kidney disease with stage 5 chronic kidney disease or end stage renal disease: Secondary | ICD-10-CM | POA: Diagnosis present

## 2013-09-28 DIAGNOSIS — F17200 Nicotine dependence, unspecified, uncomplicated: Secondary | ICD-10-CM | POA: Diagnosis not present

## 2013-09-28 LAB — I-STAT CHEM 8, ED
BUN: 85 mg/dL — ABNORMAL HIGH (ref 6–23)
Calcium, Ion: 1.1 mmol/L — ABNORMAL LOW (ref 1.12–1.23)
Chloride: 107 mEq/L (ref 96–112)
Creatinine, Ser: 5.5 mg/dL — ABNORMAL HIGH (ref 0.50–1.35)
GLUCOSE: 152 mg/dL — AB (ref 70–99)
HCT: 41 % (ref 39.0–52.0)
HEMOGLOBIN: 13.9 g/dL (ref 13.0–17.0)
Potassium: 3.5 mEq/L — ABNORMAL LOW (ref 3.7–5.3)
Sodium: 144 mEq/L (ref 137–147)
TCO2: 29 mmol/L (ref 0–100)

## 2013-09-28 NOTE — ED Notes (Signed)
Per pt sent here for hypertension. sts he took BP meds. BP normal at triage. sts also some feet swelling.

## 2013-09-28 NOTE — ED Provider Notes (Signed)
CSN: [id number]     Arrival date & time 09/28/13  1219 History   First MD Initiated Contact with Patient 09/28/13 1540     Chief Complaint  Patient presents with  . Hypertension     HPI Per pt sent here for hypertension. sts he took BP meds. BP normal at triage. sts also some feet swelling.  Patient denies complaints of headache, chest pain, numbness, tingling, confusion or shortness of breath  Past Medical History  Diagnosis Date  . ESRD (end stage renal disease)   . Hypertension   . Diabetes mellitus without complication   . CHF (congestive heart failure)   . Stroke   . Seizures    Past Surgical History  Procedure Laterality Date  . Av fistula placement Left 09/11/2013    Procedure: Insertion of arteriovenous gortex graft;  Surgeon: Chuck Hint, MD;  [facility] OR;  Service: Vascular;  Laterality: Left;   History reviewed. No pertinent family history. History  Substance Use Topics  . Smoking status: Current Every Day Smoker -- 0.50 packs/day    Types: Cigarettes  . Smokeless tobacco: Not on file  . Alcohol Use: No    Review of Systems  All other systems reviewed and are negative  Allergies  Pork-derived products  Home Medications   Prior to Admission medications   Medication Sig Start Date End Date Taking? Authorizing Provider  amLODipine (NORVASC) 10 MG tablet Take 10 mg by mouth daily.   Yes Historical Provider, MD  aspirin 81 MG tablet Take 81 mg by mouth daily.   Yes Historical Provider, MD  atorvastatin (LIPITOR) 80 MG tablet Take 80 mg by mouth daily.   Yes Historical Provider, MD  calcitRIOL (ROCALTROL) 0.25 MCG capsule Take 1 capsule (0.25 mcg total) by mouth daily. 09/12/13  Yes Zannie Cove, MD  carvedilol (COREG) 12.5 MG tablet Take 12.5 mg by mouth 2 (two) times daily with a meal.   Yes Historical Provider, MD  cholecalciferol (VITAMIN D) 1000 UNITS tablet Take 1,000 Units by mouth every morning.   Yes Historical Provider, MD  furosemide  (LASIX) 80 MG tablet Take 1 tablet (80 mg total) by mouth daily. 09/12/13  Yes Zannie Cove, MD  hydrALAZINE (APRESOLINE) 50 MG tablet Take 50 mg by mouth 3 (three) times daily.   Yes Historical Provider, MD  insulin aspart (NOVOLOG) 100 UNIT/ML injection Inject 5 Units into the skin 3 (three) times daily before meals.   Yes Historical Provider, MD  insulin glargine (LANTUS) 100 UNIT/ML injection Inject 20 Units into the skin at bedtime.   Yes Historical Provider, MD  oxyCODONE-acetaminophen (PERCOCET/ROXICET) 5-325 MG per tablet Take 1-2 tablets by mouth every 6 (six) hours as needed for severe pain. 09/12/13  Yes Shanker Levora Dredge, MD  sodium bicarbonate 650 MG tablet Take 2 tablets (1,300 mg total) by mouth 3 (three) times daily. 09/12/13  Yes Zannie Cove, MD  thiamine (VITAMIN B-1) 100 MG tablet Take 100 mg by mouth every morning.   Yes Historical Provider, MD   BP 150/73  Pulse 59  Temp(Src) 97.8 F (36.6 C) (Oral)  Resp 10  SpO2 99% Physical Exam Physical Exam  Nursing note and vitals reviewed. Constitutional: He is oriented to person, place, and time. He appears well-developed and well-nourished. No distress.  HENT:  Head: Normocephalic and atraumatic.  Eyes: Pupils are equal, round, and reactive to light.  Neck: Normal range of motion.  Cardiovascular: Normal rate and intact distal pulses.   Pulmonary/Chest: No respiratory  distress.  Abdominal: Normal appearance. He exhibits no distension.  Musculoskeletal: Normal range of motion.  Neurological: He is alert and oriented to person, place, and time. No cranial nerve deficit.  Skin: Skin is warm and dry. No rash noted.  Psychiatric: He has a normal mood and affect. His behavior is normal.   ED Course  Procedures (including critical care time) Labs Review Labs Reviewed  I-STAT CHEM 8, ED - Abnormal; Notable for the following:    Potassium 3.5 (*)    BUN 85 (*)    Creatinine, Ser 5.50 (*)    Glucose, Bld 152 (*)     Calcium, Ion 1.10 (*)    All other components within normal limits    Imaging Review No results found.   EKG Interpretation   Date/Time:  Sunday September 28 2013 16:41:00 EDT Ventricular Rate:  62 PR Interval:  190 QRS Duration: 148 QT Interval:  491 QTC Calculation: 499 R Axis:   106 Text Interpretation:  Sinus rhythm RBBB and LPFB Abnormal inferior Q waves  Confirmed by Radford Pax  MD, Reason Helzer ([id number]) on 09/28/2013 4:44:13 PM      MDM   Final diagnoses:  End stage renal disease        Nelia Shi, MD 09/28/13 1753

## 2013-09-28 NOTE — Discharge Instructions (Signed)
End-Stage Kidney Disease °The kidneys are two organs that lie on either side of the spine between the middle of the back and the front of the abdomen. The kidneys:  °· Remove wastes and extra water from the blood.   °· Produce important hormones. These help keep bones strong, regulate blood pressure, and help create red blood cells.   °· Balance the fluids and chemicals in the blood and tissues. °End-stage kidney disease occurs when the kidneys are so damaged that they cannot do their job. When the kidneys cannot do their job, life-threatening problems occur. The body cannot stay clean and strong without the help of the kidneys. In end-stage kidney disease, the kidneys cannot get better. You need a new kidney or treatments to do some of the work healthy kidneys do in order to stay alive. °CAUSES  °End-stage kidney disease usually occurs when a long-lasting (chronic) kidney disease gets worse. It may also occur after the kidneys are suddenly damaged (acute kidney injury).  °SYMPTOMS  °· Swelling (edema) of the legs, ankles, or feet.   °· Tiredness (lethargy).   °· Nausea or vomiting.   °· Confusion.   °· Problems with urination, such as:   °¨ Decreased urine production.   °¨ Frequent urination, especially at night.   °¨ Frequent accidents in children who are potty trained.   °· Muscle twitches and cramps.   °· Persistent itchiness.   °· Loss of appetite.   °· Headaches.   °· Abnormally dark or light skin.   °· Numbness in the hands or feet.   °· Easy bruising.   °· Frequent hiccups.   °· Menstruation stops. °DIAGNOSIS  °Your health care provider will measure your blood pressure and take some tests. These may include:  °· Urine tests.   °· Blood tests.   °· Imaging tests, such as:   °¨ An ultrasound exam.   °¨ Computed tomography (CT). °· A kidney biopsy. °TREATMENT  °There are two treatments for end-stage kidney disease:  °· A procedure that removes toxic wastes from the body (dialysis).   °· Receiving a new kidney  (kidney transplant). °Both of these treatments have serious risks and consequences. Your health care provider will help you determine which treatment is best for you based on your health, age, and other factors. In addition to having dialysis or a kidney transplant, you may need to take medicines to control high blood pressure (hypertension) and cholesterol and to decrease phosphorus levels in your blood.  °HOME CARE INSTRUCTIONS °· Follow your prescribed diet.   °· Take medicines only as directed by your health care provider.   °· Do not take any new medicines (prescription, over-the-counter, or nutritional supplements) unless approved by your health care provider. Many medicines can worsen your kidney damage or need to have the dose adjusted.   °· Keep all follow-up visits as directed by your health care provider. °MAKE SURE YOU: °· Understand these instructions. °· Will watch your condition. °· Will get help right away if you are not doing well or get worse. °Document Released: 04/08/2003 Document Revised: 06/02/2013 Document Reviewed: 09/15/2011 °ExitCare® Patient Information ©2015 [employer]. This information is not intended to replace advice given to you by your health care provider. Make sure you discuss any questions you have with your health care provider. ° °

## 2013-09-30 ENCOUNTER — Encounter: Payer: Self-pay | Admitting: Vascular Surgery

## 2013-10-01 ENCOUNTER — Encounter: Payer: Self-pay | Admitting: Vascular Surgery

## 2013-10-01 ENCOUNTER — Ambulatory Visit (INDEPENDENT_AMBULATORY_CARE_PROVIDER_SITE_OTHER): Payer: Medicare Other | Admitting: Vascular Surgery

## 2013-10-01 VITALS — BP 132/75 | HR 66 | Resp 14 | Ht 67.5 in | Wt 112.0 lb

## 2013-10-01 DIAGNOSIS — N186 End stage renal disease: Secondary | ICD-10-CM

## 2013-10-01 NOTE — Progress Notes (Signed)
   Patient name: [name] [name] MRN: [id number] DOB: [birth year] Sex: male  REASON FOR VISIT: Follow up after placement of new left upper arm AV graft  HPI: [name] [name] is a 58 y.o. male who had a new left upper arm AV graft placed on 09/11/2013. He comes in for a wound check. He has no specific complaints. He has no pain or paresthesias in his left upper extremity.   REVIEW OF SYSTEMS: Arly.Keller ] denotes positive finding; [  ] denotes negative finding  CARDIOVASCULAR:   chest pain    dyspnea on exertion    CONSTITUTIONAL:   fever    chills  PHYSICAL EXAM: Filed Vitals:   10/01/13 1316  BP: 132/75  Pulse: 66  Resp: 14  Height: 5' 7.5" (1.715 m)  Weight: 112 lb (50.803 kg)   Body mass index is 17.27 kg/(m^2). GENERAL: The patient is a well-nourished male, in no acute distress. The vital signs are documented above. CARDIOVASCULAR: There is a regular rate and rhythm. PULMONARY: There is good air exchange bilaterally without wheezing or rales. His left upper arm graft has a good bruit and thrill. His incisions are healing adequately.  MEDICAL ISSUES: The patient is doing well status post placement of a new left upper arm AV graft. He is not yet on dialysis. This graft should be adequate if it is needed for dialysis.  DICKSON,CHRISTOPHER S [facility] Beeper: [phone]

## 2013-10-06 ENCOUNTER — Emergency Department (HOSPITAL_COMMUNITY)
Admission: EM | Admit: 2013-10-06 | Discharge: 2013-10-07 | Disposition: A | Discharge status: Home / Self Care | Payer: Medicare Other | Attending: Emergency Medicine | Admitting: Emergency Medicine

## 2013-10-06 ENCOUNTER — Emergency Department (HOSPITAL_COMMUNITY): Payer: Medicare Other

## 2013-10-06 ENCOUNTER — Encounter (HOSPITAL_COMMUNITY): Payer: Self-pay | Admitting: Emergency Medicine

## 2013-10-06 DIAGNOSIS — F17200 Nicotine dependence, unspecified, uncomplicated: Secondary | ICD-10-CM | POA: Insufficient documentation

## 2013-10-06 DIAGNOSIS — Z794 Long term (current) use of insulin: Secondary | ICD-10-CM | POA: Diagnosis not present

## 2013-10-06 DIAGNOSIS — R4182 Altered mental status, unspecified: Secondary | ICD-10-CM | POA: Insufficient documentation

## 2013-10-06 DIAGNOSIS — Z7982 Long term (current) use of aspirin: Secondary | ICD-10-CM | POA: Insufficient documentation

## 2013-10-06 DIAGNOSIS — Z8669 Personal history of other diseases of the nervous system and sense organs: Secondary | ICD-10-CM | POA: Diagnosis not present

## 2013-10-06 DIAGNOSIS — E162 Hypoglycemia, unspecified: Secondary | ICD-10-CM

## 2013-10-06 DIAGNOSIS — N186 End stage renal disease: Secondary | ICD-10-CM | POA: Insufficient documentation

## 2013-10-06 DIAGNOSIS — I509 Heart failure, unspecified: Secondary | ICD-10-CM | POA: Insufficient documentation

## 2013-10-06 DIAGNOSIS — Z8673 Personal history of transient ischemic attack (TIA), and cerebral infarction without residual deficits: Secondary | ICD-10-CM | POA: Insufficient documentation

## 2013-10-06 DIAGNOSIS — E1169 Type 2 diabetes mellitus with other specified complication: Secondary | ICD-10-CM | POA: Diagnosis not present

## 2013-10-06 DIAGNOSIS — I10 Essential (primary) hypertension: Secondary | ICD-10-CM

## 2013-10-06 DIAGNOSIS — I120 Hypertensive chronic kidney disease with stage 5 chronic kidney disease or end stage renal disease: Secondary | ICD-10-CM | POA: Diagnosis not present

## 2013-10-06 DIAGNOSIS — Z79899 Other long term (current) drug therapy: Secondary | ICD-10-CM | POA: Diagnosis not present

## 2013-10-06 LAB — COMPREHENSIVE METABOLIC PANEL
ALT: 34 U/L (ref 0–53)
AST: 48 U/L — AB (ref 0–37)
Albumin: 2.6 g/dL — ABNORMAL LOW (ref 3.5–5.2)
Alkaline Phosphatase: 100 U/L (ref 39–117)
Anion gap: 17 — ABNORMAL HIGH (ref 5–15)
BUN: 75 mg/dL — ABNORMAL HIGH (ref 6–23)
CALCIUM: 9 mg/dL (ref 8.4–10.5)
CO2: 19 mEq/L (ref 19–32)
Chloride: 109 mEq/L (ref 96–112)
Creatinine, Ser: 4 mg/dL — ABNORMAL HIGH (ref 0.50–1.35)
GFR calc Af Amer: 18 mL/min — ABNORMAL LOW (ref >90)
GFR calc non Af Amer: 15 mL/min — ABNORMAL LOW (ref >90)
Glucose, Bld: 77 mg/dL (ref 70–99)
POTASSIUM: 4.8 meq/L (ref 3.7–5.3)
SODIUM: 145 meq/L (ref 137–147)
TOTAL PROTEIN: 7.7 g/dL (ref 6.0–8.3)
Total Bilirubin: 0.5 mg/dL (ref 0.3–1.2)

## 2013-10-06 LAB — CBC WITH DIFFERENTIAL/PLATELET
BASOS ABS: 0 10*3/uL (ref 0.0–0.1)
Basophils Relative: 0 % (ref 0–1)
Eosinophils Absolute: 0 10*3/uL (ref 0.0–0.7)
Eosinophils Relative: 0 % (ref 0–5)
HEMATOCRIT: 53.8 % — AB (ref 39.0–52.0)
Hemoglobin: 17.3 g/dL — ABNORMAL HIGH (ref 13.0–17.0)
LYMPHS PCT: 19 % (ref 12–46)
Lymphs Abs: 1 10*3/uL (ref 0.7–4.0)
MCH: 27.9 pg (ref 26.0–34.0)
MCHC: 32.2 g/dL (ref 30.0–36.0)
MCV: 86.6 fL (ref 78.0–100.0)
MONOS PCT: 3 % (ref 3–12)
Monocytes Absolute: 0.2 10*3/uL (ref 0.1–1.0)
NEUTROS ABS: 4 10*3/uL (ref 1.7–7.7)
Neutrophils Relative %: 78 % — ABNORMAL HIGH (ref 43–77)
PLATELETS: 157 10*3/uL (ref 150–400)
RBC: 6.21 MIL/uL — ABNORMAL HIGH (ref 4.22–5.81)
RDW: 19.3 % — AB (ref 11.5–15.5)
WBC: 5.2 10*3/uL (ref 4.0–10.5)

## 2013-10-06 LAB — TROPONIN I

## 2013-10-06 LAB — CBG MONITORING, ED
GLUCOSE-CAPILLARY: 83 mg/dL (ref 70–99)
Glucose-Capillary: 114 mg/dL — ABNORMAL HIGH (ref 70–99)

## 2013-10-06 LAB — ETHANOL: Alcohol, Ethyl (B): <11 mg/dL (ref 0–11)

## 2013-10-06 MED ORDER — HYDRALAZINE HCL 20 MG/ML IJ SOLN
20.0000 mg | Freq: Once | INTRAMUSCULAR | Status: AC
  Administered 2013-10-06: 20 mg via INTRAVENOUS
  Filled 2013-10-06: qty 1

## 2013-10-06 NOTE — ED Notes (Signed)
CBG is 83. 

## 2013-10-06 NOTE — ED Notes (Signed)
Discussed plan of care with Dr. Blinda Leatherwood.  Reviewed that last blood glucose check was 96 after orange juice, and patient's mentation is improving.  MD advises to feed patient while awaiting all blood draws to return.  If labs return within normal range for the patient, will prepare for discharge.

## 2013-10-06 NOTE — ED Notes (Signed)
Discussed plan of care with Dr. Blinda Leatherwood.  Unable to obtain redraw for blood, CMP hemolyzed per lab. MD advises to collect labs by arterial stick.  Respiratory contacted, she will draw labs.

## 2013-10-06 NOTE — ED Notes (Signed)
Patient given orange juice

## 2013-10-06 NOTE — ED Notes (Signed)
Phlebotomy at the bedside  

## 2013-10-06 NOTE — ED Notes (Signed)
Lab called, reported CMP and troponin has hemolyzed. Will reattempt to collect.

## 2013-10-06 NOTE — ED Notes (Signed)
Respiratory at the bedside

## 2013-10-06 NOTE — ED Notes (Signed)
Family last spoke to him at 1am per EMS

## 2013-10-06 NOTE — ED Notes (Signed)
Called CT for head scan.  Will try to complete head CT prior to feeding.  Family reports he just had dialysis access placed and it is not being used yet.  They also report that he had an incontinent episode at home prior to arrival. Failed attempt to obtain blood for ethanol.

## 2013-10-06 NOTE — ED Notes (Signed)
CBG is 96. 

## 2013-10-06 NOTE — ED Notes (Signed)
1 amp of narcan given by EMS since patient's respirations were 6 per minute on arrival.  CBG was 11 with EMS.  1 amp of d 50 given en route.

## 2013-10-06 NOTE — ED Notes (Signed)
Patient returned from CT

## 2013-10-06 NOTE — ED Notes (Signed)
Dr. Pollina at the bedside.  

## 2013-10-06 NOTE — ED Notes (Signed)
Discussed plan of care with Dr. Blinda Leatherwood. CBG recheck showed 83, patient alert to self and place, answered faster.  Reviewed BP of 181/118 now that he is calm.  MD advises to give food/juice to manage blood glucose.  If another amp of D50 is needed, plan for admission. He will enter order to manage BP.

## 2013-10-06 NOTE — ED Provider Notes (Signed)
CSN: [id number]     Arrival date & time 10/06/13  2033 History   First MD Initiated Contact with Patient 10/06/13 2037     Chief Complaint  Patient presents with  . Altered Mental Status     (Consider location/radiation/quality/duration/timing/severity/associated sxs/prior Treatment) HPI Comments: Patient brought to the ER by ambulance for evaluation of mental status changes. The patient was reportedly found at home unresponsive. Family reported that they had last seen him at 1 AM last night. EMS reported the patient was unresponsive with decreased respiration rate. A blood sugar was checked, it was 11. Patient was given 1 amp of D50 and Narcan IV and became awake and alert. EMS seemed to feel that the patient might have improved with the Narcan more than the glucose. At arrival to the ER, patient is still somewhat agitated. He does not remember what happened. He denies drug or alcohol use.  Patient is a 58 y.o. male presenting with altered mental status.  Altered Mental Status Associated symptoms: no headaches     Past Medical History  Diagnosis Date  . ESRD (end stage renal disease)   . Hypertension   . Diabetes mellitus without complication   . CHF (congestive heart failure)   . Stroke   . Seizures    Past Surgical History  Procedure Laterality Date  . Av fistula placement Left 09/11/2013    Procedure: Insertion of arteriovenous gortex graft;  Surgeon: Chuck Hint, MD;  Location: [facility] OR;  Service: Vascular;  Laterality: Left;   Family History  Problem Relation Age of Onset  . COPD Mother   . Hypertension Mother   . Diabetes Mother    History  Substance Use Topics  . Smoking status: Current Every Day Smoker -- 0.50 packs/day    Types: Cigarettes  . Smokeless tobacco: Never Used  . Alcohol Use: No    Review of Systems  Respiratory: Negative for shortness of breath.   Cardiovascular: Negative for chest pain.  Neurological: Negative for headaches.  All other  systems reviewed and are negative.     Allergies  Pork-derived products  Home Medications   Prior to Admission medications   Medication Sig Start Date End Date Taking? Authorizing Provider  amLODipine (NORVASC) 10 MG tablet Take 10 mg by mouth daily.    Historical Provider, MD  aspirin 81 MG tablet Take 81 mg by mouth daily.    Historical Provider, MD  atorvastatin (LIPITOR) 80 MG tablet Take 80 mg by mouth daily.    Historical Provider, MD  calcitRIOL (ROCALTROL) 0.25 MCG capsule Take 1 capsule (0.25 mcg total) by mouth daily. 09/12/13   Zannie Cove, MD  carvedilol (COREG) 12.5 MG tablet Take 12.5 mg by mouth 2 (two) times daily with a meal.    Historical Provider, MD  cholecalciferol (VITAMIN D) 1000 UNITS tablet Take 1,000 Units by mouth every morning.    Historical Provider, MD  furosemide (LASIX) 80 MG tablet Take 1 tablet (80 mg total) by mouth daily. 09/12/13   Zannie Cove, MD  hydrALAZINE (APRESOLINE) 50 MG tablet Take 50 mg by mouth 3 (three) times daily.    Historical Provider, MD  insulin aspart (NOVOLOG) 100 UNIT/ML injection Inject 5 Units into the skin 3 (three) times daily before meals.    Historical Provider, MD  insulin glargine (LANTUS) 100 UNIT/ML injection Inject 20 Units into the skin at bedtime.    Historical Provider, MD  oxyCODONE-acetaminophen (PERCOCET/ROXICET) 5-325 MG per tablet Take 1-2 tablets by mouth every  6 (six) hours as needed for severe pain. 09/12/13   Shanker Levora Dredge, MD  sodium bicarbonate 650 MG tablet Take 2 tablets (1,300 mg total) by mouth 3 (three) times daily. 09/12/13   Zannie Cove, MD  thiamine (VITAMIN B-1) 100 MG tablet Take 100 mg by mouth every morning.    Historical Provider, MD   BP 191/126  Pulse 79  Temp(Src) 95.1 F (35.1 C) (Rectal)  Resp 12  Ht  (1.651 m)  Wt 110 lb (49.896 kg)  BMI 18.31 kg/m2  SpO2 91% Physical Exam  Constitutional: He appears well-developed and well-nourished. No distress.  HENT:  Head:  Normocephalic and atraumatic.  Right Ear: Hearing normal.  Left Ear: Hearing normal.  Nose: Nose normal.  Mouth/Throat: Oropharynx is clear and moist and mucous membranes are normal.  Eyes: Conjunctivae and EOM are normal. Pupils are equal, round, and reactive to light.  Neck: Normal range of motion. Neck supple.  Cardiovascular: Regular rhythm, S1 normal and S2 normal.  Exam reveals no gallop and no friction rub.   No murmur heard. Pulmonary/Chest: Effort normal and breath sounds normal. No respiratory distress. He exhibits no tenderness.  Abdominal: Soft. Normal appearance and bowel sounds are normal. There is no hepatosplenomegaly. There is no tenderness. There is no rebound, no guarding, no tenderness at McBurney's point and negative Murphy's sign. No hernia.  Musculoskeletal: Normal range of motion.  Neurological: He is alert. He has normal strength. He is disoriented. No cranial nerve deficit or sensory deficit. Coordination normal. GCS eye subscore is 4. GCS verbal subscore is 4. GCS motor subscore is 6.  Skin: Skin is warm, dry and intact. No rash noted. No cyanosis.  Psychiatric: He has a normal mood and affect. His speech is normal and behavior is normal. Thought content normal.    ED Course  Procedures (including critical care time) Labs Review Labs Reviewed  CBG MONITORING, ED - Abnormal; Notable for the following:    Glucose-Capillary 114 (*)    All other components within normal limits  CBC WITH DIFFERENTIAL  COMPREHENSIVE METABOLIC PANEL  TROPONIN I  URINALYSIS, ROUTINE W REFLEX MICROSCOPIC  URINE RAPID DRUG SCREEN (HOSP PERFORMED)  ETHANOL    Imaging Review No results found.   EKG Interpretation   Date/Time:  Monday October 06 2013 20:58:53 EDT Ventricular Rate:  78 PR Interval:  154 QRS Duration: 128 QT Interval:  501 QTC Calculation: 571 R Axis:   124 Text Interpretation:  Sinus rhythm RBBB and LPFB ST elevation, consider  inferior injury No  significant change since last tracing Confirmed by  Sender Rueb  MD, Anique Beckley [phone]) on 10/06/2013 9:02:45 PM      MDM   Final diagnoses:  None  Hypoglycemia Hypertension   She presented to the ER for evaluation of mental status changes. Patient was profoundly hypoglycemic when EMS found him. He responded to an amp of D50. Patient has been monitored for a period of time here in the ER and has not had any rebound hypotension. He is eating a meal currently and doing well. His workup has been negative otherwise.  Patient did have hypertension upon arrival. He has been noncompliant with his medications today. He was administered hydralazine here in the ER with improvement. There is no chest pain. Cardiac workup negative.  Patient will be discharged after prolonged monitoring here in the ER. He was counseled to patient eats if he takes his insulin. He is to take all his medications as prescribed.  Gilda Crease, MD 10/06/13 [phone]

## 2013-10-07 LAB — CBG MONITORING, ED
Glucose-Capillary: 96 mg/dL (ref 70–99)
Glucose-Capillary: 161 mg/dL — ABNORMAL HIGH (ref 70–99)

## 2013-10-07 NOTE — ED Notes (Signed)
Sister [place] Meo can be reached at [phone]

## 2013-10-07 NOTE — ED Notes (Signed)
Called sister and discussed that patient is being discharged to waiting room now that blood sugar has increaased to 161 and blood pressure has decreased.  She is initially resistant to pick patient up, but agrees to after explained why he cannot be held overnight at this time. Reported to Tresa Endo, Charity fundraiser in triage and charge [facility].

## 2013-10-07 NOTE — Discharge Instructions (Signed)
Hypertension Hypertension is another name for high blood pressure. High blood pressure forces your heart to work harder to pump blood. A blood pressure reading has two numbers, which includes a higher number over a lower number (example: 110/72). HOME CARE   Have your blood pressure rechecked by your doctor.  Only take medicine as told by your doctor. Follow the directions carefully. The medicine does not work as well if you skip doses. Skipping doses also puts you at risk for problems.  Do not smoke.  Monitor your blood pressure at home as told by your doctor. GET HELP IF:  You think you are having a reaction to the medicine you are taking.  You have repeat headaches or feel dizzy.  You have puffiness (swelling) in your ankles.  You have trouble with your vision. GET HELP RIGHT AWAY IF:   You get a very bad headache and are confused.  You feel weak, numb, or faint.  You get chest or belly (abdominal) pain.  You throw up (vomit).  You cannot breathe very well. MAKE SURE YOU:   Understand these instructions.  Will watch your condition.  Will get help right away if you are not doing well or get worse. Document Released: 07/05/2007 Document Revised: 01/21/2013 Document Reviewed: 11/08/2012 [facility] Patient Information 2015 [place], [place]. This information is not intended to replace advice given to you by your health care provider. Make sure you discuss any questions you have with your health care provider.  Low Blood Sugar Low blood sugar (hypoglycemia) means that the level of sugar in your blood is lower than it should be. Signs of low blood sugar include:  Getting sweaty.  Feeling hungry.  Feeling dizzy or weak.  Feeling sleepier than normal.  Feeling nervous.  Headaches.  Having a fast heartbeat. Low blood sugar can happen fast and can be an emergency. Your doctor can do tests to check your blood sugar level. You can have low blood sugar and not have  diabetes. HOME CARE  Check your blood sugar as told by your doctor. If it is less than 70 mg/dl or as told by your doctor, take 1 of the following:  3 to 4 glucose tablets.   cup clear juice.   cup soda pop, not diet.  1 cup milk.  5 to 6 hard candies.  Recheck blood sugar after 15 minutes. Repeat until it is at the right level.  Eat a snack if it is more than 1 hour until the next meal.  Only take medicine as told by your doctor.  Do not skip meals. Eat on time.  Do not drink alcohol except with meals.  Check your blood glucose before driving.  Check your blood glucose before and after exercise.  Always carry treatment with you, such as glucose pills.  Always wear a medical alert bracelet if you have diabetes. GET HELP RIGHT AWAY IF:   Your blood glucose goes below 70 mg/dl or as told by your doctor, and you:  Are confused.  Are not able to swallow.  Pass out (faint).  You cannot treat yourself. You may need someone to help you.  You have low blood sugar problems often.  You have problems from your medicines.  You are not feeling better after 3 to 4 days.  You have vision changes. MAKE SURE YOU:   Understand these instructions.  Will watch this condition.  Will get help right away if you are not doing well or get worse. Document Released:  04/12/2009 Document Revised: 04/10/2011 Document Reviewed: 04/12/2009 [employer] Patient Information 2015 [place], [place]. This information is not intended to replace advice given to you by your health care provider. Make sure you discuss any questions you have with your health care provider.

## 2013-10-07 NOTE — ED Notes (Signed)
Patient placed in paper scrubs in preparing for discharge.

## 2013-10-07 NOTE — ED Notes (Signed)
CBG is 161. Preparing for discharge.

## 2013-10-16 ENCOUNTER — Telehealth: Payer: Self-pay

## 2013-10-16 NOTE — Telephone Encounter (Signed)
Patient has [employer] LINQ implanted 03/19/13 for syncope.

## 2013-10-16 NOTE — Telephone Encounter (Signed)
-----   Message from Iva Lento sent at 10/16/2013  2:31 PM EDT -----  Calling about his ICD, needs date of his implant.   April from [facility] in [place], [place].   [phone]

## 2013-10-17 NOTE — Telephone Encounter (Signed)
Patient's remote follow up has been transferred to [facility] per their request.  April notified.

## 2013-10-27 ENCOUNTER — Emergency Department (HOSPITAL_COMMUNITY): Payer: Medicare Other

## 2013-10-27 ENCOUNTER — Encounter (HOSPITAL_COMMUNITY): Payer: Self-pay | Admitting: Emergency Medicine

## 2013-10-27 ENCOUNTER — Inpatient Hospital Stay (HOSPITAL_COMMUNITY)
Admission: EM | Admit: 2013-10-27 | Discharge: 2013-10-31 | DRG: 637 | Disposition: A | Discharge status: Home Health | Payer: Medicare Other | Attending: Internal Medicine | Admitting: Internal Medicine

## 2013-10-27 DIAGNOSIS — T503X1A Poisoning by electrolytic, caloric and water-balance agents, accidental (unintentional), initial encounter: Secondary | ICD-10-CM | POA: Diagnosis not present

## 2013-10-27 DIAGNOSIS — E46 Unspecified protein-calorie malnutrition: Secondary | ICD-10-CM | POA: Diagnosis present

## 2013-10-27 DIAGNOSIS — Y92238 Other place in hospital as the place of occurrence of the external cause: Secondary | ICD-10-CM

## 2013-10-27 DIAGNOSIS — B1920 Unspecified viral hepatitis C without hepatic coma: Secondary | ICD-10-CM | POA: Diagnosis present

## 2013-10-27 DIAGNOSIS — E875 Hyperkalemia: Secondary | ICD-10-CM | POA: Diagnosis present

## 2013-10-27 DIAGNOSIS — I69354 Hemiplegia and hemiparesis following cerebral infarction affecting left non-dominant side: Secondary | ICD-10-CM

## 2013-10-27 DIAGNOSIS — D6489 Other specified anemias: Secondary | ICD-10-CM | POA: Diagnosis present

## 2013-10-27 DIAGNOSIS — R4182 Altered mental status, unspecified: Secondary | ICD-10-CM | POA: Diagnosis not present

## 2013-10-27 DIAGNOSIS — Z8673 Personal history of transient ischemic attack (TIA), and cerebral infarction without residual deficits: Secondary | ICD-10-CM

## 2013-10-27 DIAGNOSIS — Z79899 Other long term (current) drug therapy: Secondary | ICD-10-CM | POA: Diagnosis not present

## 2013-10-27 DIAGNOSIS — E1121 Type 2 diabetes mellitus with diabetic nephropathy: Secondary | ICD-10-CM

## 2013-10-27 DIAGNOSIS — I639 Cerebral infarction, unspecified: Secondary | ICD-10-CM

## 2013-10-27 DIAGNOSIS — E43 Unspecified severe protein-calorie malnutrition: Secondary | ICD-10-CM

## 2013-10-27 DIAGNOSIS — N170 Acute kidney failure with tubular necrosis: Secondary | ICD-10-CM | POA: Diagnosis present

## 2013-10-27 DIAGNOSIS — M6282 Rhabdomyolysis: Secondary | ICD-10-CM | POA: Diagnosis present

## 2013-10-27 DIAGNOSIS — Z87898 Personal history of other specified conditions: Secondary | ICD-10-CM

## 2013-10-27 DIAGNOSIS — E1122 Type 2 diabetes mellitus with diabetic chronic kidney disease: Secondary | ICD-10-CM

## 2013-10-27 DIAGNOSIS — M7989 Other specified soft tissue disorders: Secondary | ICD-10-CM

## 2013-10-27 DIAGNOSIS — E872 Acidosis, unspecified: Secondary | ICD-10-CM | POA: Diagnosis present

## 2013-10-27 DIAGNOSIS — I5032 Chronic diastolic (congestive) heart failure: Secondary | ICD-10-CM | POA: Diagnosis present

## 2013-10-27 DIAGNOSIS — G40909 Epilepsy, unspecified, not intractable, without status epilepticus: Secondary | ICD-10-CM | POA: Diagnosis present

## 2013-10-27 DIAGNOSIS — N184 Chronic kidney disease, stage 4 (severe): Secondary | ICD-10-CM

## 2013-10-27 DIAGNOSIS — R569 Unspecified convulsions: Secondary | ICD-10-CM

## 2013-10-27 DIAGNOSIS — F1210 Cannabis abuse, uncomplicated: Secondary | ICD-10-CM | POA: Diagnosis present

## 2013-10-27 DIAGNOSIS — E869 Volume depletion, unspecified: Secondary | ICD-10-CM | POA: Diagnosis not present

## 2013-10-27 DIAGNOSIS — N186 End stage renal disease: Secondary | ICD-10-CM | POA: Diagnosis present

## 2013-10-27 DIAGNOSIS — F17210 Nicotine dependence, cigarettes, uncomplicated: Secondary | ICD-10-CM | POA: Diagnosis present

## 2013-10-27 DIAGNOSIS — I10 Essential (primary) hypertension: Secondary | ICD-10-CM | POA: Diagnosis present

## 2013-10-27 DIAGNOSIS — I120 Hypertensive chronic kidney disease with stage 5 chronic kidney disease or end stage renal disease: Secondary | ICD-10-CM | POA: Diagnosis present

## 2013-10-27 DIAGNOSIS — Z825 Family history of asthma and other chronic lower respiratory diseases: Secondary | ICD-10-CM | POA: Diagnosis not present

## 2013-10-27 DIAGNOSIS — Z8249 Family history of ischemic heart disease and other diseases of the circulatory system: Secondary | ICD-10-CM | POA: Diagnosis not present

## 2013-10-27 DIAGNOSIS — G9340 Encephalopathy, unspecified: Secondary | ICD-10-CM | POA: Diagnosis present

## 2013-10-27 DIAGNOSIS — E162 Hypoglycemia, unspecified: Secondary | ICD-10-CM | POA: Diagnosis present

## 2013-10-27 DIAGNOSIS — R739 Hyperglycemia, unspecified: Secondary | ICD-10-CM

## 2013-10-27 DIAGNOSIS — Z833 Family history of diabetes mellitus: Secondary | ICD-10-CM | POA: Diagnosis not present

## 2013-10-27 DIAGNOSIS — Z794 Long term (current) use of insulin: Secondary | ICD-10-CM | POA: Diagnosis not present

## 2013-10-27 DIAGNOSIS — I82409 Acute embolism and thrombosis of unspecified deep veins of unspecified lower extremity: Secondary | ICD-10-CM

## 2013-10-27 DIAGNOSIS — I82621 Acute embolism and thrombosis of deep veins of right upper extremity: Secondary | ICD-10-CM | POA: Diagnosis not present

## 2013-10-27 DIAGNOSIS — E1165 Type 2 diabetes mellitus with hyperglycemia: Secondary | ICD-10-CM

## 2013-10-27 DIAGNOSIS — R64 Cachexia: Secondary | ICD-10-CM | POA: Diagnosis present

## 2013-10-27 DIAGNOSIS — E1129 Type 2 diabetes mellitus with other diabetic kidney complication: Secondary | ICD-10-CM | POA: Diagnosis present

## 2013-10-27 DIAGNOSIS — G9341 Metabolic encephalopathy: Secondary | ICD-10-CM | POA: Diagnosis present

## 2013-10-27 DIAGNOSIS — N19 Unspecified kidney failure: Secondary | ICD-10-CM

## 2013-10-27 DIAGNOSIS — I82401 Acute embolism and thrombosis of unspecified deep veins of right lower extremity: Secondary | ICD-10-CM

## 2013-10-27 DIAGNOSIS — E11649 Type 2 diabetes mellitus with hypoglycemia without coma: Principal | ICD-10-CM | POA: Diagnosis present

## 2013-10-27 DIAGNOSIS — F1910 Other psychoactive substance abuse, uncomplicated: Secondary | ICD-10-CM | POA: Diagnosis present

## 2013-10-27 HISTORY — DX: Unspecified viral hepatitis C without hepatic coma: B19.20

## 2013-10-27 HISTORY — DX: Unspecified osteoarthritis, unspecified site: M19.90

## 2013-10-27 HISTORY — DX: Chronic kidney disease, stage 4 (severe): N18.4

## 2013-10-27 HISTORY — DX: Type 2 diabetes mellitus without complications: E11.9

## 2013-10-27 HISTORY — DX: Respiratory tuberculosis unspecified: A15.9

## 2013-10-27 LAB — CBC
HCT: 40.7 % (ref 39.0–52.0)
Hemoglobin: 13.5 g/dL (ref 13.0–17.0)
MCH: 26.4 pg (ref 26.0–34.0)
MCHC: 33.2 g/dL (ref 30.0–36.0)
MCV: 79.6 fL (ref 78.0–100.0)
PLATELETS: 144 10*3/uL — AB (ref 150–400)
RBC: 5.11 MIL/uL (ref 4.22–5.81)
RDW: 17.7 % — ABNORMAL HIGH (ref 11.5–15.5)
WBC: 7.9 10*3/uL (ref 4.0–10.5)

## 2013-10-27 LAB — BASIC METABOLIC PANEL
ANION GAP: 18 — AB (ref 5–15)
BUN: 109 mg/dL — ABNORMAL HIGH (ref 6–23)
CO2: 15 mEq/L — ABNORMAL LOW (ref 19–32)
Calcium: 7.8 mg/dL — ABNORMAL LOW (ref 8.4–10.5)
Chloride: 101 mEq/L (ref 96–112)
Creatinine, Ser: 4.98 mg/dL — ABNORMAL HIGH (ref 0.50–1.35)
GFR calc Af Amer: 14 mL/min — ABNORMAL LOW (ref >90)
GFR, EST NON AFRICAN AMERICAN: 12 mL/min — AB (ref >90)
Glucose, Bld: 168 mg/dL — ABNORMAL HIGH (ref 70–99)
Potassium: 4.1 mEq/L (ref 3.7–5.3)
SODIUM: 134 meq/L — AB (ref 137–147)

## 2013-10-27 LAB — URINALYSIS, ROUTINE W REFLEX MICROSCOPIC
BILIRUBIN URINE: NEGATIVE
Glucose, UA: NEGATIVE mg/dL
KETONES UR: NEGATIVE mg/dL
Leukocytes, UA: NEGATIVE
Nitrite: NEGATIVE
PH: 5 (ref 5.0–8.0)
Protein, ur: 100 mg/dL — AB
SPECIFIC GRAVITY, URINE: 1.013 (ref 1.005–1.030)
Urobilinogen, UA: 0.2 mg/dL (ref 0.0–1.0)

## 2013-10-27 LAB — URINE MICROSCOPIC-ADD ON

## 2013-10-27 LAB — COMPREHENSIVE METABOLIC PANEL
ALBUMIN: 3.3 g/dL — AB (ref 3.5–5.2)
ALK PHOS: 114 U/L (ref 39–117)
ALT: 43 U/L (ref 0–53)
ANION GAP: 18 — AB (ref 5–15)
AST: 54 U/L — ABNORMAL HIGH (ref 0–37)
BILIRUBIN TOTAL: 0.3 mg/dL (ref 0.3–1.2)
BUN: 125 mg/dL — AB (ref 6–23)
CO2: 15 mEq/L — ABNORMAL LOW (ref 19–32)
Calcium: 9.7 mg/dL (ref 8.4–10.5)
Chloride: 113 mEq/L — ABNORMAL HIGH (ref 96–112)
Creatinine, Ser: 6.08 mg/dL — ABNORMAL HIGH (ref 0.50–1.35)
GFR calc Af Amer: 11 mL/min — ABNORMAL LOW (ref >90)
GFR calc non Af Amer: 9 mL/min — ABNORMAL LOW (ref >90)
Glucose, Bld: 83 mg/dL (ref 70–99)
POTASSIUM: 5.9 meq/L — AB (ref 3.7–5.3)
Sodium: 146 mEq/L (ref 137–147)
Total Protein: 9.3 g/dL — ABNORMAL HIGH (ref 6.0–8.3)

## 2013-10-27 LAB — CBC WITH DIFFERENTIAL/PLATELET
BASOS PCT: 1 % (ref 0–1)
Basophils Absolute: 0 10*3/uL (ref 0.0–0.1)
Eosinophils Absolute: 0 10*3/uL (ref 0.0–0.7)
Eosinophils Relative: 0 % (ref 0–5)
HCT: 48.9 % (ref 39.0–52.0)
HEMOGLOBIN: 16.2 g/dL (ref 13.0–17.0)
LYMPHS ABS: 1.5 10*3/uL (ref 0.7–4.0)
Lymphocytes Relative: 30 % (ref 12–46)
MCH: 27.5 pg (ref 26.0–34.0)
MCHC: 33.1 g/dL (ref 30.0–36.0)
MCV: 83 fL (ref 78.0–100.0)
MONOS PCT: 3 % (ref 3–12)
Monocytes Absolute: 0.1 10*3/uL (ref 0.1–1.0)
NEUTROS ABS: 3.4 10*3/uL (ref 1.7–7.7)
Neutrophils Relative %: 67 % (ref 43–77)
Platelets: 174 10*3/uL (ref 150–400)
RBC: 5.89 MIL/uL — AB (ref 4.22–5.81)
RDW: 18.2 % — ABNORMAL HIGH (ref 11.5–15.5)
WBC: 5.1 10*3/uL (ref 4.0–10.5)

## 2013-10-27 LAB — CK: Total CK: 2447 U/L — ABNORMAL HIGH (ref 7–232)

## 2013-10-27 LAB — PROTEIN / CREATININE RATIO, URINE
Creatinine, Urine: 74.62 mg/dL
Protein Creatinine Ratio: 2.44 — ABNORMAL HIGH (ref 0.00–0.15)
TOTAL PROTEIN, URINE: 181.7 mg/dL

## 2013-10-27 LAB — I-STAT VENOUS BLOOD GAS, ED
Acid-base deficit: 13 mmol/L — ABNORMAL HIGH (ref 0.0–2.0)
Bicarbonate: 14.1 mEq/L — ABNORMAL LOW (ref 20.0–24.0)
O2 SAT: 90 %
TCO2: 15 mmol/L (ref 0–100)
pCO2, Ven: 35 mmHg — ABNORMAL LOW (ref 45.0–50.0)
pH, Ven: 7.215 — ABNORMAL LOW (ref 7.250–7.300)
pO2, Ven: 69 mmHg — ABNORMAL HIGH (ref 30.0–45.0)

## 2013-10-27 LAB — CBG MONITORING, ED
GLUCOSE-CAPILLARY: 93 mg/dL (ref 70–99)
GLUCOSE-CAPILLARY: 382 mg/dL — AB (ref 70–99)
Glucose-Capillary: 489 mg/dL — ABNORMAL HIGH (ref 70–99)
Glucose-Capillary: 498 mg/dL — ABNORMAL HIGH (ref 70–99)

## 2013-10-27 LAB — RAPID URINE DRUG SCREEN, HOSP PERFORMED
Amphetamines: NOT DETECTED
BARBITURATES: NOT DETECTED
BENZODIAZEPINES: NOT DETECTED
COCAINE: NOT DETECTED
Opiates: NOT DETECTED
Tetrahydrocannabinol: POSITIVE — AB

## 2013-10-27 LAB — ETHANOL: Alcohol, Ethyl (B): <11 mg/dL (ref 0–11)

## 2013-10-27 LAB — SODIUM, URINE, RANDOM: Sodium, Ur: 52 mEq/L

## 2013-10-27 LAB — MAGNESIUM: Magnesium: 2.7 mg/dL — ABNORMAL HIGH (ref 1.5–2.5)

## 2013-10-27 LAB — GLUCOSE, CAPILLARY
GLUCOSE-CAPILLARY: 169 mg/dL — AB (ref 70–99)
GLUCOSE-CAPILLARY: 186 mg/dL — AB (ref 70–99)
Glucose-Capillary: 199 mg/dL — ABNORMAL HIGH (ref 70–99)

## 2013-10-27 LAB — TROPONIN I: Troponin I: <0.3 ng/mL (ref <0.30)

## 2013-10-27 LAB — POTASSIUM: POTASSIUM: 5.9 meq/L — AB (ref 3.7–5.3)

## 2013-10-27 LAB — CREATININE, URINE, RANDOM: Creatinine, Urine: 74.44 mg/dL

## 2013-10-27 MED ORDER — CALCITRIOL 0.25 MCG PO CAPS
0.2500 ug | ORAL_CAPSULE | Freq: Every day | ORAL | Status: DC
  Filled 2013-10-27: qty 1

## 2013-10-27 MED ORDER — AMLODIPINE BESYLATE 10 MG PO TABS
10.0000 mg | ORAL_TABLET | Freq: Every day | ORAL | Status: DC
  Administered 2013-10-27 – 2013-10-31 (×5): 10 mg via ORAL
  Filled 2013-10-27 (×5): qty 1

## 2013-10-27 MED ORDER — SODIUM POLYSTYRENE SULFONATE 15 GM/60ML PO SUSP
45.0000 g | Freq: Once | ORAL | Status: AC
  Administered 2013-10-27: 45 g via ORAL
  Filled 2013-10-27: qty 180

## 2013-10-27 MED ORDER — ATORVASTATIN CALCIUM 80 MG PO TABS
80.0000 mg | ORAL_TABLET | Freq: Every day | ORAL | Status: DC
  Filled 2013-10-27: qty 1

## 2013-10-27 MED ORDER — SODIUM CHLORIDE 0.9 % IJ SOLN
3.0000 mL | Freq: Two times a day (BID) | INTRAMUSCULAR | Status: DC
  Administered 2013-10-28: 3 mL via INTRAVENOUS

## 2013-10-27 MED ORDER — ASPIRIN EC 81 MG PO TBEC
81.0000 mg | DELAYED_RELEASE_TABLET | Freq: Every day | ORAL | Status: DC
  Administered 2013-10-27 – 2013-10-31 (×5): 81 mg via ORAL
  Filled 2013-10-27 (×5): qty 1

## 2013-10-27 MED ORDER — ONDANSETRON HCL 4 MG/2ML IJ SOLN
4.0000 mg | Freq: Four times a day (QID) | INTRAMUSCULAR | Status: DC | PRN
Start: 2013-10-27 — End: 2013-10-31

## 2013-10-27 MED ORDER — INSULIN ASPART 100 UNIT/ML ~~LOC~~ SOLN
0.0000 [IU] | SUBCUTANEOUS | Status: DC
  Administered 2013-10-27: 2 [IU] via SUBCUTANEOUS
  Administered 2013-10-27: 20 [IU] via SUBCUTANEOUS
  Administered 2013-10-27: 2 [IU] via SUBCUTANEOUS
  Administered 2013-10-28: 5 [IU] via SUBCUTANEOUS
  Administered 2013-10-28: 3 [IU] via SUBCUTANEOUS
  Administered 2013-10-28: 5 [IU] via SUBCUTANEOUS
  Administered 2013-10-28: 7 [IU] via SUBCUTANEOUS
  Administered 2013-10-28: 2 [IU] via SUBCUTANEOUS
  Administered 2013-10-29: 1 [IU] via SUBCUTANEOUS
  Filled 2013-10-27 (×2): qty 1

## 2013-10-27 MED ORDER — HEPARIN SODIUM (PORCINE) 5000 UNIT/ML IJ SOLN
5000.0000 [IU] | Freq: Three times a day (TID) | INTRAMUSCULAR | Status: DC
  Administered 2013-10-27 – 2013-10-29 (×6): 5000 [IU] via SUBCUTANEOUS
  Filled 2013-10-27 (×8): qty 1

## 2013-10-27 MED ORDER — CALCIUM ACETATE 667 MG PO CAPS
667.0000 mg | ORAL_CAPSULE | Freq: Three times a day (TID) | ORAL | Status: DC
  Administered 2013-10-27 – 2013-10-31 (×12): 667 mg via ORAL
  Filled 2013-10-27 (×14): qty 1

## 2013-10-27 MED ORDER — ONDANSETRON HCL 4 MG PO TABS: 4.0000 mg | ORAL_TABLET | Freq: Four times a day (QID) | ORAL | Status: DC | PRN

## 2013-10-27 MED ORDER — VITAMIN B-1 100 MG PO TABS
100.0000 mg | ORAL_TABLET | Freq: Every morning | ORAL | Status: DC
  Administered 2013-10-28 – 2013-10-31 (×4): 100 mg via ORAL
  Filled 2013-10-27 (×4): qty 1

## 2013-10-27 MED ORDER — POTASSIUM CHLORIDE CRYS ER 20 MEQ PO TBCR
40.0000 meq | EXTENDED_RELEASE_TABLET | Freq: Once | ORAL | Status: AC
  Administered 2013-10-27: 40 meq via ORAL
  Filled 2013-10-27: qty 2

## 2013-10-27 MED ORDER — ACETAMINOPHEN 650 MG RE SUPP: 650.0000 mg | Freq: Four times a day (QID) | RECTAL | Status: DC | PRN

## 2013-10-27 MED ORDER — ASPIRIN EC 81 MG PO TBEC: 81.0000 mg | DELAYED_RELEASE_TABLET | Freq: Every day | ORAL | Status: DC

## 2013-10-27 MED ORDER — ACETAMINOPHEN 325 MG PO TABS: 650.0000 mg | ORAL_TABLET | Freq: Four times a day (QID) | ORAL | Status: DC | PRN

## 2013-10-27 MED ORDER — DOCUSATE SODIUM 100 MG PO CAPS
100.0000 mg | ORAL_CAPSULE | Freq: Two times a day (BID) | ORAL | Status: DC
  Administered 2013-10-28 – 2013-10-29 (×2): 100 mg via ORAL
  Filled 2013-10-27 (×9): qty 1

## 2013-10-27 MED ORDER — DEXTROSE 5 % IV SOLN
Freq: Once | INTRAVENOUS | Status: AC
  Administered 2013-10-27: 10:00:00 via INTRAVENOUS

## 2013-10-27 MED ORDER — SODIUM CHLORIDE 0.9 % IV SOLN: INTRAVENOUS | Status: AC

## 2013-10-27 MED ORDER — SODIUM CHLORIDE 0.9 % IV BOLUS (SEPSIS)
500.0000 mL | Freq: Once | INTRAVENOUS | Status: AC
  Administered 2013-10-27: 500 mL via INTRAVENOUS

## 2013-10-27 MED ORDER — CARVEDILOL 12.5 MG PO TABS
12.5000 mg | ORAL_TABLET | Freq: Two times a day (BID) | ORAL | Status: DC
  Administered 2013-10-27 – 2013-10-29 (×4): 12.5 mg via ORAL
  Filled 2013-10-27 (×6): qty 1

## 2013-10-27 MED ORDER — SODIUM BICARBONATE 8.4 % IV SOLN
INTRAVENOUS | Status: DC
  Administered 2013-10-27 – 2013-10-28 (×2): via INTRAVENOUS
  Filled 2013-10-27 (×3): qty 1000

## 2013-10-27 NOTE — H&P (Signed)
[facility] History and Physical                                                                                    Patient Demographics  [name] [name], is a 58 y.o. male  MRN: [id number]   DOB - 01-07-1956  Admit Date - 10/27/2013  Outpatient Primary MD for the patient is No PCP Per Patient   With History of -  Past Medical History  Diagnosis Date  . ESRD (end stage renal disease)   . Hypertension   . Diabetes mellitus without complication   . CHF (congestive heart failure)   . Stroke   . Seizures       Past Surgical History  Procedure Laterality Date  . Av fistula placement Left 09/11/2013    Procedure: Insertion of arteriovenous gortex graft;  Surgeon: Chuck Hint, MD;  Location: [facility] OR;  Service: Vascular;  Laterality: Left;    in for   Chief Complaint  Patient presents with  . Hypoglycemia  . Loss of Consciousness     HPI  [name] [name]  is a 58 y.o. male, with a history of CHF, ESRD, CVA, HTN, seizures who presented with altered mental status and fatigue when his sister saw him this morning at his house. EMS was called and taken to the ED. EMS found his CBG at 29. Patient was started on D50 and now levels have raised to the 400's. Patient stated he is in no discomfort except for mild weakness and is thirsty. Patient had difficulty recalling the details of this morning and yesterday. Patient states that he has been taking his insulin as directed and that he has been eating meals regularly in the past day. He stated that he sister disagrees, and that he has not been compliant. He denied nausea, vomiting, chest pain, abdominal pain and shortness of breath.   HD fistula was created approximately 2 months ago, but as of yet he has not started HD.  In the ER he was found to be hyperkalemia with an elevated CK.  He appeared dehydrated.  Sodium 146, hemoglobin 16.2, creatinine 6.08 (baseline creatinine 09/12/2013 was 4.14)  Review of Systems    In addition  to the HPI above,  No Fever-chills, No Headache, No changes with vision, No problems swallowing water, No Chest pain, Cough or Shortness of Breath, No Abdominal pain, No nausea or vomiting No new weakness, tingling, numbness in any extremity,  A full 10 point Review of Systems was done, except as stated above, all other Review of Systems were negative.   Social History History  Substance Use Topics  . Smoking status: Current Every Day Smoker -- 0.50 packs/day    Types: Cigarettes  . Smokeless tobacco: Never Used  . Alcohol Use: No     Family History Family History  Problem Relation Age of Onset  . COPD Mother   . Hypertension Mother   . Diabetes Mother      Prior to Admission medications   Medication Sig Start Date End Date Taking? Authorizing Provider  amLODipine (NORVASC) 10 MG tablet Take 10 mg by mouth daily.   Yes Historical Provider, MD  aspirin  81 MG tablet Take 81 mg by mouth daily.   Yes Historical Provider, MD  atorvastatin (LIPITOR) 80 MG tablet Take 80 mg by mouth daily.   Yes Historical Provider, MD  calcitRIOL (ROCALTROL) 0.25 MCG capsule Take 1 capsule (0.25 mcg total) by mouth daily. 09/12/13  Yes Zannie Cove, MD  calcium acetate (PHOSLO) 667 MG capsule Take 667 mg by mouth 3 (three) times daily with meals.   Yes Historical Provider, MD  carvedilol (COREG) 12.5 MG tablet Take 12.5 mg by mouth 2 (two) times daily with a meal.   Yes Historical Provider, MD  furosemide (LASIX) 80 MG tablet Take 1 tablet (80 mg total) by mouth daily. 09/12/13  Yes Zannie Cove, MD  insulin aspart (NOVOLOG) 100 UNIT/ML injection Inject 5 Units into the skin 3 (three) times daily before meals.   Yes Historical Provider, MD  insulin glargine (LANTUS) 100 UNIT/ML injection Inject 25 Units into the skin at bedtime.    Yes Historical Provider, MD  thiamine (VITAMIN B-1) 100 MG tablet Take 100 mg by mouth every morning.   Yes Historical Provider, MD    Allergies  Allergen  Reactions  . Pork-Derived Products Other (See Comments)    unknown    Physical Exam  Vitals  Blood pressure 149/76, pulse 71, temperature 95.3 F (35.2 C), temperature source Rectal, resp. rate 18, height  (1.702 m), weight 487.163 kg (1074 lb), SpO2 100.00%.   General:  lying in bed in NAD, appears older than stated age, appears undernourished.  Poor historian.  Psych:  Normal affect and questionable insight,  awake, alert, able to follow commands  Neuro:   CN 2-12 grossly intact, Weakness in left extremities, sensation intact all 4 extremities.  ENT:  Ears and Eyes appear Normal, Conjunctivae clear, EOMI.  Neck:  Supple Neck, no cervical lymphadenopathy appreciated  Respiratory:  Symmetrical Chest wall movement, Good air movement bilaterally, CTAB.  Cardiac:  RRR, No Gallops, Rubs or Murmurs, No Parasternal Heave. Decreased heart sounds.  Abdomen:  Positive Bowel Sounds, Abdomen Soft, Non tender, No organomegaly appriciated  Skin:  No Cyanosis, Normal Skin Turgor, No Skin Rash or Bruise.  Extremities:  Good muscle tone,  joints appear normal , no effusion, palpable thrill in LUE   Data Review  CBC  Recent Labs Lab 10/27/13 0845  WBC 5.1  HGB 16.2  HCT 48.9  PLT 174  MCV 83.0  MCH 27.5  MCHC 33.1  RDW 18.2*  LYMPHSABS 1.5  MONOABS 0.1  EOSABS 0.0  BASOSABS 0.0   ------------------------------------------------------------------------------------------------------------------  Chemistries   Recent Labs Lab 10/27/13 0845 10/27/13 1131  NA 146  --   K 5.9* 5.9*  CL 113*  --   CO2 15*  --   GLUCOSE 83  --   BUN 125*  --   CREATININE 6.08*  --   CALCIUM 9.7  --   MG 2.7*  --   AST 54*  --   ALT 43  --   ALKPHOS 114  --   BILITOT 0.3  --    ------------------------------------------------------------------------------------------------------------------ estimated creatinine clearance is 44.5 ml/min (by C-G formula based on Cr of  6.08). ------------------------------------------------------------------------------------------------------------------     Cardiac Enzymes  Recent Labs Lab 10/27/13 0852  TROPONINI <0.30     ---------------------------------------------------------------------------------------------------------------  Urinalysis    Component Value Date/Time   COLORURINE YELLOW 10/27/2013 1048   APPEARANCEUR CLEAR 10/27/2013 1048   LABSPEC 1.013 10/27/2013 1048   PHURINE 5.0 10/27/2013 1048   GLUCOSEU NEGATIVE 10/27/2013  1048   HGBUR SMALL* 10/27/2013 1048   BILIRUBINUR NEGATIVE 10/27/2013 1048   KETONESUR NEGATIVE 10/27/2013 1048   PROTEINUR 100* 10/27/2013 1048   UROBILINOGEN 0.2 10/27/2013 1048   NITRITE NEGATIVE 10/27/2013 1048   LEUKOCYTESUR NEGATIVE 10/27/2013 1048    ----------------------------------------------------------------------------------------------------------------  Imaging results:   Dg Chest 2 View  10/27/2013   CLINICAL DATA:  Hypoglycemia.  EXAM: CHEST  2 VIEW  COMPARISON:  10/06/2013.  FINDINGS: The lungs are clear without focal infiltrate, edema, pneumothorax or pleural effusion. The cardiopericardial silhouette is within normal limits for size. Imaged bony structures of the thorax are intact.  IMPRESSION: Interval resolution of left lung airspace disease. No acute cardiopulmonary findings on today's study.   Electronically Signed   By: Kennith Center M.D.   On: 10/27/2013 09:07   Ct Head Wo Contrast  10/27/2013   CLINICAL DATA:  Altered level of consciousness.  EXAM: CT HEAD WITHOUT CONTRAST  TECHNIQUE: Contiguous axial images were obtained from the base of the skull through the vertex without intravenous contrast.  COMPARISON:  Head CT scan 10/06/2013.  FINDINGS: Scattered areas of hypoattenuation in the subcortical and periventricular deep white matter are again seen consistent chronic microvascular ischemic change. The brain is mildly atrophic. No evidence of acute  abnormality including infarct, hemorrhage, mass lesion, mass effect, midline shift or abnormal extra-axial fluid collection is identified. There is no hydrocephalus or pneumocephalus. The calvarium is intact.  IMPRESSION: No acute finding.  Stable compared to prior exam.   Electronically Signed   By: Drusilla Kanner M.D.   On: 10/27/2013 09:18   Ct Head Wo Contrast  10/06/2013   CLINICAL DATA:  Unresponsive, altered mental status  EXAM: CT HEAD WITHOUT CONTRAST  TECHNIQUE: Contiguous axial images were obtained from the base of the skull through the vertex without intravenous contrast.  COMPARISON:  None.  FINDINGS: No evidence of parenchymal hemorrhage or extra-axial fluid collection. No mass lesion, mass effect, or midline shift.  No CT evidence of acute infarction.  Subcortical white matter and periventricular small vessel ischemic changes.  Cerebral volume is within normal limits.  No ventriculomegaly.  The visualized paranasal sinuses are essentially clear. The mastoid air cells are unopacified.  No evidence of calvarial fracture.  IMPRESSION: No evidence of acute intracranial abnormality.  Small vessel ischemic changes.   Electronically Signed   By: Charline Bills M.D.   On: 10/06/2013 22:40   Dg Chest Port 1 View  10/06/2013   CLINICAL DATA:  Altered mental status.  EXAM: PORTABLE CHEST - 1 VIEW  COMPARISON:  None.  FINDINGS: Lungs are adequately inflated with hazy airspace opacification over the left midlung suggesting infection and less likely asymmetric edema. No evidence of effusion. Cardiomediastinal silhouette and remainder the exam is unremarkable.  IMPRESSION: Hazy airspace opacification over the central left lung likely due to infection and less likely asymmetric edema.   Electronically Signed   By: Elberta Fortis M.D.   On: 10/06/2013 21:17    My personal review of EKG: Rhythm NSR, no significant changes from August EKG.    Assessment & Plan  Principal Problem: Hypoglycemia - Likely  secondary to poor oral intake, and continued use of insulin. - Start on D50 with bicarbonate (for hyperkalemia) given at 75 ml/hr. Discontinue home regimen.  -Continue to monitor CBG q 2 hours and control with SSI  Active Problems: Hyperkalemia -5.9 on admission- likely secondary to worsening renal function -given one dose of kayexalate-repeat BMET this pm  Metabolic acidosis -Secondary  to renal function. - Holding lasix, hydrate with IV fluids including bicarbonate  Acute on Chronic stage 5 renal disease -Worsened by not eating, dehydration, and reportedly vomiting. (baseline creatinine 09/12/2013 was 4.14) -Will give gentle IV fluids with bicarb. Suspect very close to requiring dialysis, but seen on an emergent indication at this time. -Renal Consulted - appreciate their recommendations.  Elevated CK -might be related to drug abuse or dehydration -mild rhabdo.  Will hydrate and recheck ck 9/29.     Hypertension -stable, continue to monitor.  Continue Coreg and amlodipine.  CHF (congestive heart failure) -stable, continue coreg -hold lasix   Diabetes - Ported history of type 2 diabetes, apparently previously was on oral hypoglycemics. Recent admission in this facility for diabetic ketoacidosis- dose of Lantus increased on discharge. Difficult situation, given the risk for hypoglycemia given worsening renal function. For now hold Lantus, start SSI, will need further optimization of insulin regimen on discharge.  Social -Patient has difficulty caring for himself at home due to weakness from stroke -Potential confused or decreased cognitive state prior to admission -Uses walker and sometimes a wheelchair -Stays with sister -Dietitian for potential care placement -PT/OT evaluation. -May need HHSW at discharge if he goes home rather that ALF or SNF.  Stroke history -continue aspirin and statin  DVT Prophylaxis Heparin  AM Labs Ordered, also please review Full  Orders  Family Communication:  Alone in room. Sister was in room earlier.     Code Status:  Full  Likely DC to  To be determined  Condition:  Guarded  Time spent in minutes : 60    Cruz Condon T PA-C on 10/27/2013 at 2:25 PM  Between 7am to 7pm - Pager - [phone]  After 7pm go to [email or web address] - password [email or web address]  And look for the night coverage person covering me after hours  [facility] Office  [phone]  Attending Patient was seen, examined,treatment plan was discussed with the  Advance Practice Provider.  I have directly reviewed the clinical findings, lab, imaging studies and management of this patient in detail. I have made the necessary changes to the above noted documentation, and agree with the documentation, as recorded by the Advance Practice Provider.   In short, patient with a history of stage IV renal failure, distended with hypoglycemia. Evidence of worsening renal function with hyperkalemia and metabolic acidosis. Stop Lantus, cautiously continue with SSI, given Kayexalate in the emergency room-repeat BMET in pm. Await renal panel.  Windell Norfolk MD [facility].

## 2013-10-27 NOTE — ED Notes (Signed)
PA students at bedside, per consulting internal medicine.

## 2013-10-27 NOTE — ED Notes (Signed)
EMS - Pt coming from home after being found by family to lethargic and having an unknown altered LOC, last known normal was last night.  Initial CBG on scene was 29, given 1 amp, increased to 89.  EMS rechecked and had dropped back to 59, given an additional 1 amp, final CBG was 73 at 8:13am.  Initial with no speech and normal eye movement.  Denies pain.  Had a episode of incontinence.  Sister gave conflicting information on mental status stating there is mild confusion and some left sided deficits.  128/76 BP, 66 pulse, 16 respirations and 100% room air.

## 2013-10-27 NOTE — ED Notes (Signed)
Checked patient blood sugar it was 382 notified RN Clydie Braun

## 2013-10-27 NOTE — ED Notes (Signed)
Per admitting MD at pager # [phone] give 20 Units of Novolog insulin.  Verified with RN [place].

## 2013-10-27 NOTE — ED Provider Notes (Signed)
CSN: [id number]     Arrival date & time 10/27/13  0815 History   First MD Initiated Contact with Patient 10/27/13 [phone]     Chief Complaint  Patient presents with  . Hypoglycemia  . Loss of Consciousness     (Consider location/radiation/quality/duration/timing/severity/associated sxs/prior Treatment) HPI  Pt with hx ESRD, HTN, DM, CHF, stroke with chronic left sided weakness, seizures, with AVG placement in LUE 09/11/13 p/w decreased level of consciousness, found by sister at home.  EMS called, CBG 29, 1 amp D50 given, CBG then 89, recheck 59, second amp D50 given, CBG recheck 73.  Per EMS, unclear whether patient was at baseline mentation or was confused per sister's conflicting accounts.  Pt has no complaints at all with exception of being thirsty.  Denies any pain, recent illness, SOB, vomiting, diarrhea.  Reports taking his medications as prescribed.    Past Medical History  Diagnosis Date  . ESRD (end stage renal disease)   . Hypertension   . Diabetes mellitus without complication   . CHF (congestive heart failure)   . Stroke   . Seizures    Past Surgical History  Procedure Laterality Date  . Av fistula placement Left 09/11/2013    Procedure: Insertion of arteriovenous gortex graft;  Surgeon: Chuck Hint, MD;  Location: [facility] OR;  Service: Vascular;  Laterality: Left;   Family History  Problem Relation Age of Onset  . COPD Mother   . Hypertension Mother   . Diabetes Mother    History  Substance Use Topics  . Smoking status: Current Every Day Smoker -- 0.50 packs/day    Types: Cigarettes  . Smokeless tobacco: Never Used  . Alcohol Use: No    Review of Systems  All other systems reviewed and are negative.     Allergies  Pork-derived products  Home Medications   Prior to Admission medications   Medication Sig Start Date End Date Taking? Authorizing Provider  amLODipine (NORVASC) 10 MG tablet Take 10 mg by mouth daily.    Historical Provider, MD  aspirin  81 MG tablet Take 81 mg by mouth daily.    Historical Provider, MD  atorvastatin (LIPITOR) 80 MG tablet Take 80 mg by mouth daily.    Historical Provider, MD  calcitRIOL (ROCALTROL) 0.25 MCG capsule Take 1 capsule (0.25 mcg total) by mouth daily. 09/12/13   Zannie Cove, MD  carvedilol (COREG) 12.5 MG tablet Take 12.5 mg by mouth 2 (two) times daily with a meal.    Historical Provider, MD  cholecalciferol (VITAMIN D) 1000 UNITS tablet Take 1,000 Units by mouth every morning.    Historical Provider, MD  furosemide (LASIX) 80 MG tablet Take 1 tablet (80 mg total) by mouth daily. 09/12/13   Zannie Cove, MD  hydrALAZINE (APRESOLINE) 50 MG tablet Take 50 mg by mouth 3 (three) times daily.    Historical Provider, MD  insulin aspart (NOVOLOG) 100 UNIT/ML injection Inject 5 Units into the skin 3 (three) times daily before meals.    Historical Provider, MD  insulin glargine (LANTUS) 100 UNIT/ML injection Inject 20 Units into the skin at bedtime.    Historical Provider, MD  oxyCODONE-acetaminophen (PERCOCET/ROXICET) 5-325 MG per tablet Take 1-2 tablets by mouth every 6 (six) hours as needed for severe pain. 09/12/13   Shanker Levora Dredge, MD  sodium bicarbonate 650 MG tablet Take 2 tablets (1,300 mg total) by mouth 3 (three) times daily. 09/12/13   Zannie Cove, MD  thiamine (VITAMIN B-1) 100 MG tablet Take  100 mg by mouth every morning.    Historical Provider, MD   BP 145/76  Pulse 65  Resp 18  Ht  (1.702 m)  Wt 1074 lb (487.163 kg)  BMI 168.17 kg/m2  SpO2 100% Physical Exam  Nursing note and vitals reviewed. Constitutional: He appears cachectic. He is active.  Non-toxic appearance. No distress.  HENT:  Head: Normocephalic and atraumatic.  Neck: Neck supple.  Cardiovascular: Normal rate and regular rhythm.   LUE graft with bruit, palpable thrill  Pulmonary/Chest: Effort normal and breath sounds normal. No respiratory distress. He has no wheezes. He has no rales.  Abdominal: Soft. He  exhibits no distension and no mass. There is no tenderness. There is no rebound and no guarding.  Musculoskeletal: He exhibits no edema.  Neurological: He is alert. No cranial nerve deficit or sensory deficit. He exhibits normal muscle tone. GCS eye subscore is 4. GCS verbal subscore is 5. GCS motor subscore is 6.  Oriented to self, place.  Knows the president.  Thinks it is August 21, 2013.    Strength left sided extremities 4/5, right side 5/5  Skin: He is not diaphoretic.    ED Course  Procedures (including critical care time) Labs Review Labs Reviewed  CBC WITH DIFFERENTIAL - Abnormal; Notable for the following:    RBC 5.89 (*)    RDW 18.2 (*)    All other components within normal limits  COMPREHENSIVE METABOLIC PANEL - Abnormal; Notable for the following:    Potassium 5.9 (*)    Chloride 113 (*)    CO2 15 (*)    BUN 125 (*)    Creatinine, Ser 6.08 (*)    Total Protein 9.3 (*)    Albumin 3.3 (*)    AST 54 (*)    GFR calc non Af Amer 9 (*)    GFR calc Af Amer 11 (*)    Anion gap 18 (*)    All other components within normal limits  MAGNESIUM - Abnormal; Notable for the following:    Magnesium 2.7 (*)    All other components within normal limits  ETHANOL  TROPONIN I  URINE RAPID DRUG SCREEN (HOSP PERFORMED)  URINALYSIS, ROUTINE W REFLEX MICROSCOPIC  BLOOD GAS, VENOUS  POTASSIUM  CBG MONITORING, ED  CBG MONITORING, ED    Imaging Review Dg Chest 2 View  10/27/2013   CLINICAL DATA:  Hypoglycemia.  EXAM: CHEST  2 VIEW  COMPARISON:  10/06/2013.  FINDINGS: The lungs are clear without focal infiltrate, edema, pneumothorax or pleural effusion. The cardiopericardial silhouette is within normal limits for size. Imaged bony structures of the thorax are intact.  IMPRESSION: Interval resolution of left lung airspace disease. No acute cardiopulmonary findings on today's study.   Electronically Signed   By: Kennith Center M.D.   On: 10/27/2013 09:07   Ct Head Wo Contrast  10/27/2013    CLINICAL DATA:  Altered level of consciousness.  EXAM: CT HEAD WITHOUT CONTRAST  TECHNIQUE: Contiguous axial images were obtained from the base of the skull through the vertex without intravenous contrast.  COMPARISON:  Head CT scan 10/06/2013.  FINDINGS: Scattered areas of hypoattenuation in the subcortical and periventricular deep white matter are again seen consistent chronic microvascular ischemic change. The brain is mildly atrophic. No evidence of acute abnormality including infarct, hemorrhage, mass lesion, mass effect, midline shift or abnormal extra-axial fluid collection is identified. There is no hydrocephalus or pneumocephalus. The calvarium is intact.  IMPRESSION: No acute finding.  Stable  compared to prior exam.   Electronically Signed   By: Drusilla Kanner M.D.   On: 10/27/2013 09:18     EKG Interpretation   Date/Time:  Monday October 27 2013 08:27:12 EDT Ventricular Rate:  65 PR Interval:  173 QRS Duration: 131 QT Interval:  493 QTC Calculation: 513 R Axis:   90 Text Interpretation:  Sinus rhythm RBBB and LPFB No significant change  since last tracing Confirmed by STEINL  MD, Caryn Bee ([id number]) on 10/27/2013  8:38:02 AM     QTc noted to be 513.    8:57 AM Dr Denton Lank made aware of patient.  Reviewed EKG with Dr Denton Lank.   10:18 AM Discussed labs and K treatment with Dr Denton Lank.  Will redraw K.  Plan is for admission.  Breakfast has been ordered for patient.  Dr Denton Lank has also seen patient and spoken with family.   12:42 PM I spoke with hospitalist Dr Jerral Ralph who will admit the patient.  He called to my attention that potassium was administered to this patient while he was in the ED.  It was ordered under my name and presumably by me.  However, this was not an intended order and I appreciate Dr Windell Norfolk observation of this.  Per our discussion I have ordered Kayexalate.  I have also informed Dr Denton Lank.     MDM   Final diagnoses:  Hypoglycemia  Renal failure    Afebrile,  nontoxic, chronic malnourished patient with PMH HTN, CVA, CHF, ESRD not yet on dialysis but with AVG placed 08/2013, brittle DM, recent diagosis Hep C, admission in 08/2013 for DKA, seen in ED 10/06/13 for hypoglycemia.  Pt brought in by EMS today for decreased level of consciousness, found to have CBG 29.  Improved with D50/D5, eating.  Labs show worsening renal function.  Please see note above regarding potassium.  Admitted to [facility].      Trixie Dredge, PA-C 10/27/13 1256  [place], PA-C 10/27/13 1620

## 2013-10-27 NOTE — Progress Notes (Signed)
Chaplain visited with patient after having conversation with  his brother who was tearful over patient current medical status.  Patient brother indicated that the patient had diabetes and kidney problems and he was having a difficult time understanding this and seeing him in this condition. Patient brother asked that I pray for patient.   Patient sister and brother at bedside.  Provided emotional and spiritual support to family and patient. Prayed for patient and offer to follow up as needed.    10/27/13 0900  Clinical Encounter Type  Visited With Family;Patient and family together;Health care provider  Visit Type Initial;Spiritual support;ED  Referral From Family  Spiritual Encounters  Spiritual Needs Prayer;Emotional  Stress Factors  Patient Stress Factors Exhausted;Family relationships  Family Stress Factors Exhausted;Family relationships  Venida Jarvis, Chaplain,pager [phone]

## 2013-10-27 NOTE — Consult Note (Signed)
I have seen and examined this patient and agree with the plan of care [name] [name] W 10/27/2013, 8:31 PM  

## 2013-10-27 NOTE — ED Notes (Addendum)
Dr. Johna Roles at bedside.

## 2013-10-27 NOTE — Consult Note (Signed)
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   He presented to the ED today with acute encephalopathy and found to be hypoglycemic with blood sugar of 29 that improved to 73 after D50 administration intake and is currently elevated at 498. He had a similar episode earlier this month (Sept 7) with CBG of 11. He has thirst and has had good PO intake at home (ate breakfast and lunch today) with no GI losses. He reports insulin use as prescribed at home and no sulfonylurea use.   He has CKD Stage IV/V with GFR of 18 and Cr of 4 on 10/06/13. On admission his Cr has increased to 6.08 with GFR of 11. His potassium level was elevated at 5.9 and he was mistakenly given kdur 40 mEq (x1 dose) followed by kayexalate 45 g with resulting BM. His CK level was elevated at 2447 on admission. He is unsure if he had possible seizure. His UDS was positive for THC but he denies drug use. He was acidotic on admission with AG 18 and bicarbonate of 15 (he is not on bicarbonate at home). UA revealed protein (100). His last renal US on 09/09/13 revealed bilaterally increased cortical echotexture consistent with medical renal disease.  He is followed by Dr. Glean Hess of [facility] whom he saw recently this past month. He had a left AV graft placed by Dr. Scot Dock on 09/11/13 that is functioning normally.   He is at home on lasix 80 mg daily and reports urine output that is at baseline with no hematuria. He does not take calcitriol which is on his home medication list. His last PTH level was normal last month.   He is HCV positive (detected August  2015) but has not seen anyone for follow-up regarding it. He denies ascites, rash, jaundice, or arthralgias.    PMH:   Past Medical History  Diagnosis Date  . ESRD (end stage renal disease)   . Hypertension   . Diabetes mellitus without complication   . CHF (congestive heart failure)   . Stroke   . Seizures     PSH:   Past Surgical History  Procedure Laterality Date  . Av fistula placement Left 09/11/2013    Procedure: Insertion of arteriovenous gortex graft;  Surgeon: Angelia Mould, MD;  Location: [facility];  Service: Vascular;  Laterality: Left;    Allergies:  Allergies  Allergen Reactions  . Pork-Derived Products Other (See Comments)    unknown    Medications:   Prior to Admission medications   Medication Sig Start Date End Date Taking? Authorizing Provider  amLODipine (NORVASC) 10 MG tablet Take 10 mg by mouth daily.   Yes Historical Provider, MD  aspirin 81 MG tablet Take 81 mg by mouth daily.   Yes Historical Provider, MD  atorvastatin (LIPITOR) 80 MG tablet Take 80 mg by mouth daily.   Yes Historical Provider, MD  calcitRIOL (ROCALTROL) 0.25 MCG capsule Take 1 capsule (0.25 mcg total) by mouth daily. 09/12/13  Yes Domenic Polite, MD  calcium acetate (PHOSLO) 667 MG capsule Take 667 mg by mouth 3 (three) times daily with meals.  Yes Historical Provider, MD  carvedilol (COREG) 12.5 MG tablet Take 12.5 mg by mouth 2 (two) times daily with a meal.   Yes Historical Provider, MD  furosemide (LASIX) 80 MG tablet Take 1 tablet (80 mg total) by mouth daily. 09/12/13  Yes Domenic Polite, MD  insulin aspart (NOVOLOG) 100 UNIT/ML injection Inject 5 Units into the skin 3 (three) times daily before meals.   Yes Historical Provider, MD  insulin glargine (LANTUS) 100 UNIT/ML injection Inject 25 Units into the skin at bedtime.    Yes Historical Provider, MD  thiamine (VITAMIN B-1) 100 MG tablet Take 100 mg by mouth every morning.   Yes Historical Provider, MD    Discontinued Meds:    Medications Discontinued During This Encounter  Medication Reason  . cholecalciferol (VITAMIN D) 1000 UNITS tablet Patient has not taken in last 30 days  . hydrALAZINE (APRESOLINE) 50 MG tablet Patient has not taken in last 30 days  . oxyCODONE-acetaminophen (PERCOCET/ROXICET) 5-325 MG per tablet No longer needed (for PRN medications)  . sodium bicarbonate 650 MG tablet Discontinued by provider      Family History:   Family History  Problem Relation Age of Onset  . COPD Mother   . Hypertension Mother   . Diabetes Mother     Social History:  reports that he has been smoking Cigarettes.  He has been smoking about 0.50 packs per day. He has never used smokeless tobacco. He reports that he does not drink alcohol or use illicit drugs. Review of Systems  Constitutional: Negative for fever, chills, weight loss, malaise/fatigue and diaphoresis.  HENT:       Rhinorrhea  Eyes: Negative for blurred vision.  Respiratory: Negative for cough and shortness of breath.   Cardiovascular: Negative for chest pain, palpitations and leg swelling.  Gastrointestinal: Negative for nausea, vomiting, abdominal pain, diarrhea and constipation.  Genitourinary: Negative for dysuria, urgency, frequency, hematuria and flank pain.  Musculoskeletal: Positive for falls. Negative for myalgias.  Neurological: Positive for focal weakness (chronic left sided after CVA) and loss of consciousness (unclear). Negative for dizziness, tremors, sensory change, speech change, weakness and headaches. Seizures: unclear.  Psychiatric/Behavioral: Negative for substance abuse.    Blood pressure 149/76, pulse 71, temperature 95.3 F (35.2 C), temperature source Rectal, resp. rate 18, height $RemoveBe'5\' 7"'qFACOhwOv$  (1.702 m), weight 1074 lb (487.163 kg), SpO2 100.00%.  Physical Exam  Constitutional: He is oriented to person, place, and time. No distress.  Thin appearing  HENT:  Head: Normocephalic and atraumatic.  Right Ear: External ear  normal.  Left Ear: External ear normal.  Nose: Nose normal.  Mouth/Throat: Oropharynx is clear and moist. No oropharyngeal exudate.  Eyes: Conjunctivae and EOM are normal. Pupils are equal, round, and reactive to light. Right eye exhibits no discharge. Left eye exhibits no discharge. No scleral icterus.  Neck: Normal range of motion. Neck supple.  Cardiovascular: Normal rate and regular rhythm.   Respiratory: Effort normal and breath sounds normal. No respiratory distress. He has no wheezes. He has no rales.  GI: Soft. Bowel sounds are normal. He exhibits no distension. There is no tenderness. There is no rebound and no guarding.  Musculoskeletal: Normal range of motion. He exhibits no edema and no tenderness.  Left UE AVG with good bruit and thrill   Neurological: He is alert and oriented to person, place, and time. No cranial nerve deficit.  4/5 left UE/LE muscle strength, otherwise 5/5 throughout  Skin: Skin is warm and dry. No rash noted.  He is not diaphoretic. No erythema. No pallor.  Left LE with scab  Psychiatric: He has a normal mood and affect. His behavior is normal. Judgment and thought content normal.    Creatinine, Ser  Date/Time Value Ref Range Status  10/27/2013  8:45 AM 6.08* 0.50 - 1.35 mg/dL Final  10/06/2013 10:07 PM 4.00* 0.50 - 1.35 mg/dL Final  09/28/2013  5:00 PM 5.50* 0.50 - 1.35 mg/dL Final  09/12/2013  4:21 AM 4.14* 0.50 - 1.35 mg/dL Final  09/11/2013  9:16 AM 4.00* 0.50 - 1.35 mg/dL Final  09/11/2013  5:12 AM 4.07* 0.50 - 1.35 mg/dL Final  09/10/2013 11:19 PM 4.19* 0.50 - 1.35 mg/dL Final  09/10/2013  9:58 AM 4.39* 0.50 - 1.35 mg/dL Final  09/10/2013  7:17 AM 4.54* 0.50 - 1.35 mg/dL Final  09/10/2013  2:48 AM 4.54* 0.50 - 1.35 mg/dL Final  09/09/2013 11:31 PM 4.39* 0.50 - 1.35 mg/dL Final  09/09/2013  6:00 PM 4.51* 0.50 - 1.35 mg/dL Final  09/09/2013  2:40 PM 4.80* 0.50 - 1.35 mg/dL Final  09/09/2013 11:10 AM 4.80* 0.50 - 1.35 mg/dL Final  09/09/2013  5:26 AM 4.89* 0.50 -  1.35 mg/dL Final  09/09/2013  2:50 AM 5.00* 0.50 - 1.35 mg/dL Final  09/09/2013 12:03 AM 5.08* 0.50 - 1.35 mg/dL Final  09/08/2013  8:50 PM 5.43* 0.50 - 1.35 mg/dL Final    Results for orders placed during the hospital encounter of 10/27/13 (from the past 48 hour(s))  CBG MONITORING, ED     Status: None   Collection Time    10/27/13  8:39 AM      Result Value Ref Range   Glucose-Capillary 93  70 - 99 mg/dL  CBC WITH DIFFERENTIAL     Status: Abnormal   Collection Time    10/27/13  8:45 AM      Result Value Ref Range   WBC 5.1  4.0 - 10.5 K/uL   RBC 5.89 (*) 4.22 - 5.81 MIL/uL   Hemoglobin 16.2  13.0 - 17.0 g/dL   HCT 48.9  39.0 - 52.0 %   MCV 83.0  78.0 - 100.0 fL   MCH 27.5  26.0 - 34.0 pg   MCHC 33.1  30.0 - 36.0 g/dL   RDW 18.2 (*) 11.5 - 15.5 %   Platelets 174  150 - 400 K/uL   Comment: REPEATED TO VERIFY     PLATELET COUNT CONFIRMED BY SMEAR   Neutrophils Relative % 67  43 - 77 %   Neutro Abs 3.4  1.7 - 7.7 K/uL   Lymphocytes Relative 30  12 - 46 %   Lymphs Abs 1.5  0.7 - 4.0 K/uL   Monocytes Relative 3  3 - 12 %   Monocytes Absolute 0.1  0.1 - 1.0 K/uL   Eosinophils Relative 0  0 - 5 %   Eosinophils Absolute 0.0  0.0 - 0.7 K/uL   Basophils Relative 1  0 - 1 %   Basophils Absolute 0.0  0.0 - 0.1 K/uL  COMPREHENSIVE METABOLIC PANEL     Status: Abnormal   Collection Time    10/27/13  8:45 AM      Result Value Ref Range   Sodium 146  137 - 147 mEq/L   Potassium 5.9 (*) 3.7 - 5.3 mEq/L   Comment: HEMOLYSIS AT THIS LEVEL MAY AFFECT RESULT   Chloride 113 (*) 96 - 112 mEq/L   CO2 15 (*) 19 - 32 mEq/L   Glucose, Bld  83  70 - 99 mg/dL   BUN 125 (*) 6 - 23 mg/dL   Creatinine, Ser 6.08 (*) 0.50 - 1.35 mg/dL   Calcium 9.7  8.4 - 10.5 mg/dL   Total Protein 9.3 (*) 6.0 - 8.3 g/dL   Albumin 3.3 (*) 3.5 - 5.2 g/dL   AST 54 (*) 0 - 37 U/L   Comment: HEMOLYSIS AT THIS LEVEL MAY AFFECT RESULT   ALT 43  0 - 53 U/L   Comment: HEMOLYSIS AT THIS LEVEL MAY AFFECT RESULT   Alkaline  Phosphatase 114  39 - 117 U/L   Total Bilirubin 0.3  0.3 - 1.2 mg/dL   GFR calc non Af Amer 9 (*) >90 mL/min   GFR calc Af Amer 11 (*) >90 mL/min   Comment: (NOTE)     The eGFR has been calculated using the CKD EPI equation.     This calculation has not been validated in all clinical situations.     eGFR's persistently <90 mL/min signify possible Chronic Kidney     Disease.   Anion gap 18 (*) 5 - 15  MAGNESIUM     Status: Abnormal   Collection Time    10/27/13  8:45 AM      Result Value Ref Range   Magnesium 2.7 (*) 1.5 - 2.5 mg/dL  ETHANOL     Status: None   Collection Time    10/27/13  8:45 AM      Result Value Ref Range   Alcohol, Ethyl (B) <11  0 - 11 mg/dL   Comment:            LOWEST DETECTABLE LIMIT FOR     SERUM ALCOHOL IS 11 mg/dL     FOR MEDICAL PURPOSES ONLY  TROPONIN I     Status: None   Collection Time    10/27/13  8:52 AM      Result Value Ref Range   Troponin I <0.30  <0.30 ng/mL   Comment:            Due to the release kinetics of cTnI,     a negative result within the first hours     of the onset of symptoms does not rule out     myocardial infarction with certainty.     If myocardial infarction is still suspected,     repeat the test at appropriate intervals.  URINE RAPID DRUG SCREEN (HOSP PERFORMED)     Status: Abnormal   Collection Time    10/27/13 10:48 AM      Result Value Ref Range   Opiates NONE DETECTED  NONE DETECTED   Cocaine NONE DETECTED  NONE DETECTED   Benzodiazepines NONE DETECTED  NONE DETECTED   Amphetamines NONE DETECTED  NONE DETECTED   Tetrahydrocannabinol POSITIVE (*) NONE DETECTED   Barbiturates NONE DETECTED  NONE DETECTED   Comment:            DRUG SCREEN FOR MEDICAL PURPOSES     ONLY.  IF CONFIRMATION IS NEEDED     FOR ANY PURPOSE, NOTIFY LAB     WITHIN 5 DAYS.                LOWEST DETECTABLE LIMITS     FOR URINE DRUG SCREEN     Drug Class       Cutoff (ng/mL)     Amphetamine      1000     Barbiturate      200  Benzodiazepine   161     Tricyclics       096     Opiates          300     Cocaine          300     THC              50  URINALYSIS, ROUTINE W REFLEX MICROSCOPIC     Status: Abnormal   Collection Time    10/27/13 10:48 AM      Result Value Ref Range   Color, Urine YELLOW  YELLOW   APPearance CLEAR  CLEAR   Specific Gravity, Urine 1.013  1.005 - 1.030   pH 5.0  5.0 - 8.0   Glucose, UA NEGATIVE  NEGATIVE mg/dL   Hgb urine dipstick SMALL (*) NEGATIVE   Bilirubin Urine NEGATIVE  NEGATIVE   Ketones, ur NEGATIVE  NEGATIVE mg/dL   Protein, ur 100 (*) NEGATIVE mg/dL   Urobilinogen, UA 0.2  0.0 - 1.0 mg/dL   Nitrite NEGATIVE  NEGATIVE   Leukocytes, UA NEGATIVE  NEGATIVE  URINE MICROSCOPIC-ADD ON     Status: None   Collection Time    10/27/13 10:48 AM      Result Value Ref Range   Squamous Epithelial / LPF RARE  RARE   WBC, UA 3-6  <3 WBC/hpf   RBC / HPF 0-2  <3 RBC/hpf  CBG MONITORING, ED     Status: Abnormal   Collection Time    10/27/13 11:14 AM      Result Value Ref Range   Glucose-Capillary 382 (*) 70 - 99 mg/dL  POTASSIUM     Status: Abnormal   Collection Time    10/27/13 11:31 AM      Result Value Ref Range   Potassium 5.9 (*) 3.7 - 5.3 mEq/L  CK     Status: Abnormal   Collection Time    10/27/13 11:31 AM      Result Value Ref Range   Total CK 2447 (*) 7 - 232 U/L  I-STAT VENOUS BLOOD GAS, ED     Status: Abnormal   Collection Time    10/27/13 12:52 PM      Result Value Ref Range   pH, Ven 7.215 (*) 7.250 - 7.300   pCO2, Ven 35.0 (*) 45.0 - 50.0 mmHg   pO2, Ven 69.0 (*) 30.0 - 45.0 mmHg   Bicarbonate 14.1 (*) 20.0 - 24.0 mEq/L   TCO2 15  0 - 100 mmol/L   O2 Saturation 90.0     Acid-base deficit 13.0 (*) 0.0 - 2.0 mmol/L   Sample type VENOUS    CBG MONITORING, ED     Status: Abnormal   Collection Time    10/27/13  1:08 PM      Result Value Ref Range   Glucose-Capillary 489 (*) 70 - 99 mg/dL  CBG MONITORING, ED     Status: Abnormal   Collection Time    10/27/13   2:12 PM      Result Value Ref Range   Glucose-Capillary 498 (*) 70 - 99 mg/dL    Dg Chest 2 View  10/27/2013   CLINICAL DATA:  Hypoglycemia.  EXAM: CHEST  2 VIEW  COMPARISON:  10/06/2013.  FINDINGS: The lungs are clear without focal infiltrate, edema, pneumothorax or pleural effusion. The cardiopericardial silhouette is within normal limits for size. Imaged bony structures of the thorax are intact.  IMPRESSION: Interval resolution of left lung airspace  disease. No acute cardiopulmonary findings on today's study.   Electronically Signed   By: Misty Stanley M.D.   On: 10/27/2013 09:07   Ct Head Wo Contrast  10/27/2013   CLINICAL DATA:  Altered level of consciousness.  EXAM: CT HEAD WITHOUT CONTRAST  TECHNIQUE: Contiguous axial images were obtained from the base of the skull through the vertex without intravenous contrast.  COMPARISON:  Head CT scan 10/06/2013.  FINDINGS: Scattered areas of hypoattenuation in the subcortical and periventricular deep white matter are again seen consistent chronic microvascular ischemic change. The brain is mildly atrophic. No evidence of acute abnormality including infarct, hemorrhage, mass lesion, mass effect, midline shift or abnormal extra-axial fluid collection is identified. There is no hydrocephalus or pneumocephalus. The calvarium is intact.  IMPRESSION: No acute finding.  Stable compared to prior exam.   Electronically Signed   By: Inge Rise M.D.   On: 10/27/2013 09:18   Background: [name] [name] is an 57 y.o. male with past medical history of CKD Stage IV with recent AVG placement, hypertension, insulin-dependent Type II DM (last A1 11.4), CVA with residual left hemiparesis, seizure disorder (?), and newly diagnosed HCV infection who we are consulted for in regards to AKI on CKD Stage IV/V.   Recommendations:   AKI on CKD Stage V - Pt with Cr on admission of 6, above baseline 4 on 10/06/13. Etiology unclear, possibly pre-renal azotemia (BUN:Cr>20) due  to volume depletion vs ATN due to mild rhabdomyolysis with CK elevated at 2447 (possible seizure activity vs statin use (at home on atorvastatin 80 mg daily) and myglobinuria. UA with proteinuria, quantify with protein: Cr ratio. Last renal US on 09/09/13 with chronic renal disease. Pt with uncontrolled Type II DM (A1c 11.4) and newly diagnosed HCV infection. Administer IV fluids with NS for hydration and bicarbonate for acidosis. Monitor renal function panel daily, strict I/O's, daily weights, and avoidance of nephrotoxins. Pt has functioning left AVG in place (August 2015) that is maturing. Will monitor renal function, no HD at this time. Will obtain urine Na and Cr/urea to calculate FeNa/urea. Repeat CK level in AM, hold home atorvastatin 80 mg daily in setting of elevated CK level.  Hyperkalemia with no EKG changes - K 5.9 on admission. Given kdur (mistakenly) and kayexalate in ED. Now with hyperglycemia and requiring insulin. Continue to monitor.  Anion-gap Metabolic Acidosis - AG 18 with bicarbonate 15 in setting of AKI . UA with no ketones. Administer IV bicarbonate and insulin for hyperglycemia.   Hypertension - Currently hypertensive. Hold home lasix 80 mg daily in setting of volume depletion. Continue home carvedilol 12.5 mg BID and amlodipine 10 mg daily. Avoid ACEi/ARBs. Hypoglycemia/hyperglycemia in setting of insulin-dependent Type II DM - Last A1c 11.4. No ketones on UA. Management per primary team. History of hyperphosphatemia - Pt with normal PTH level (70.3) on 08/31/13, does not need to be on calcitriol (pt reports not taking calcitriol 0.25 mcg daily). Continue phoslo 667 mg TID, however caution in setting of high-normal calcium (correc 10.3), may need to switch to non-calcium based binder. Obtain phosphorus level. Chronic normocytic anemia - Hg 16.2 (most likely concentrated) with baseline Hg 11-13. Iron sat low at 5% on 8/11, repeat anemia panel. Last screening colonoscopy?  Newly diagnosed  HCV infection - Obtain HCV viral load and C3/C4 levels. Pt needs outpatient follow with Hepatitis clinic (however pt with drug abuse which will hinder possible treatment). Chronically elevated AST level with no stigmata of chronic liver disease.  Drug Abuse - UDS positive for THC. Pt denies use, counsel on cessation.   Juluis Mire PGY-II IMTS Pager: [phone] 10/27/2013, 3:07 PM   Attending note to follow

## 2013-10-28 DIAGNOSIS — E1129 Type 2 diabetes mellitus with other diabetic kidney complication: Secondary | ICD-10-CM | POA: Diagnosis present

## 2013-10-28 DIAGNOSIS — M6282 Rhabdomyolysis: Secondary | ICD-10-CM | POA: Diagnosis present

## 2013-10-28 LAB — RENAL FUNCTION PANEL
ALBUMIN: 2.5 g/dL — AB (ref 3.5–5.2)
Anion gap: 18 — ABNORMAL HIGH (ref 5–15)
BUN: 104 mg/dL — AB (ref 6–23)
CHLORIDE: 101 meq/L (ref 96–112)
CO2: 21 mEq/L (ref 19–32)
CREATININE: 4.87 mg/dL — AB (ref 0.50–1.35)
Calcium: 7.6 mg/dL — ABNORMAL LOW (ref 8.4–10.5)
GFR calc Af Amer: 14 mL/min — ABNORMAL LOW (ref >90)
GFR calc non Af Amer: 12 mL/min — ABNORMAL LOW (ref >90)
Glucose, Bld: 159 mg/dL — ABNORMAL HIGH (ref 70–99)
Phosphorus: 3.7 mg/dL (ref 2.3–4.6)
Potassium: 3.4 mEq/L — ABNORMAL LOW (ref 3.7–5.3)
Sodium: 140 mEq/L (ref 137–147)

## 2013-10-28 LAB — VITAMIN B12: VITAMIN B 12: 1006 pg/mL — AB (ref 211–911)

## 2013-10-28 LAB — CBC WITH DIFFERENTIAL/PLATELET
Basophils Absolute: 0 10*3/uL (ref 0.0–0.1)
Basophils Relative: 1 % (ref 0–1)
EOS ABS: 0.1 10*3/uL (ref 0.0–0.7)
Eosinophils Relative: 1 % (ref 0–5)
HCT: 41.5 % (ref 39.0–52.0)
HEMOGLOBIN: 13.6 g/dL (ref 13.0–17.0)
Lymphocytes Relative: 30 % (ref 12–46)
Lymphs Abs: 1.9 10*3/uL (ref 0.7–4.0)
MCH: 26.5 pg (ref 26.0–34.0)
MCHC: 32.8 g/dL (ref 30.0–36.0)
MCV: 80.9 fL (ref 78.0–100.0)
MONOS PCT: 8 % (ref 3–12)
Monocytes Absolute: 0.5 10*3/uL (ref 0.1–1.0)
NEUTROS ABS: 3.8 10*3/uL (ref 1.7–7.7)
NEUTROS PCT: 60 % (ref 43–77)
Platelets: 144 10*3/uL — ABNORMAL LOW (ref 150–400)
RBC: 5.13 MIL/uL (ref 4.22–5.81)
RDW: 17.8 % — ABNORMAL HIGH (ref 11.5–15.5)
WBC: 6.2 10*3/uL (ref 4.0–10.5)

## 2013-10-28 LAB — GLUCOSE, CAPILLARY
GLUCOSE-CAPILLARY: 163 mg/dL — AB (ref 70–99)
GLUCOSE-CAPILLARY: 258 mg/dL — AB (ref 70–99)
Glucose-Capillary: 268 mg/dL — ABNORMAL HIGH (ref 70–99)
Glucose-Capillary: 181 mg/dL — ABNORMAL HIGH (ref 70–99)
Glucose-Capillary: 305 mg/dL — ABNORMAL HIGH (ref 70–99)
Glucose-Capillary: 244 mg/dL — ABNORMAL HIGH (ref 70–99)
Glucose-Capillary: 213 mg/dL — ABNORMAL HIGH (ref 70–99)

## 2013-10-28 LAB — IRON AND TIBC
Iron: 128 ug/dL (ref 42–135)
Saturation Ratios: 54 % (ref 20–55)
TIBC: 239 ug/dL (ref 215–435)
UIBC: 111 ug/dL — ABNORMAL LOW (ref 125–400)

## 2013-10-28 LAB — RETICULOCYTES
RBC.: 5.13 MIL/uL (ref 4.22–5.81)
Retic Count, Absolute: 35.9 10*3/uL (ref 19.0–186.0)
Retic Ct Pct: 0.7 % (ref 0.4–3.1)

## 2013-10-28 LAB — HEMOGLOBIN A1C
Hgb A1c MFr Bld: 9.8 % — ABNORMAL HIGH (ref <5.7)
Mean Plasma Glucose: 235 mg/dL — ABNORMAL HIGH (ref <117)

## 2013-10-28 LAB — CK: CK TOTAL: 1880 U/L — AB (ref 7–232)

## 2013-10-28 LAB — FERRITIN: Ferritin: 592 ng/mL — ABNORMAL HIGH (ref 22–322)

## 2013-10-28 LAB — MAGNESIUM: Magnesium: 1.9 mg/dL (ref 1.5–2.5)

## 2013-10-28 LAB — FOLATE: FOLATE: 15.9 ng/mL

## 2013-10-28 LAB — UREA NITROGEN, URINE: Urea Nitrogen, Ur: 558 mg/dL

## 2013-10-28 MED ORDER — ENSURE PUDDING PO PUDG
1.0000 | ORAL | Status: DC
  Administered 2013-10-29: 1 via ORAL

## 2013-10-28 MED ORDER — BOOST / RESOURCE BREEZE PO LIQD
1.0000 | ORAL | Status: DC
  Administered 2013-10-29 – 2013-10-31 (×3): 1 via ORAL

## 2013-10-28 MED ORDER — INSULIN GLARGINE 100 UNIT/ML ~~LOC~~ SOLN
5.0000 [IU] | Freq: Every day | SUBCUTANEOUS | Status: DC
  Administered 2013-10-28 – 2013-10-30 (×3): 5 [IU] via SUBCUTANEOUS
  Filled 2013-10-28 (×4): qty 0.05

## 2013-10-28 MED ORDER — SODIUM BICARBONATE 650 MG PO TABS
1300.0000 mg | ORAL_TABLET | Freq: Two times a day (BID) | ORAL | Status: DC
  Administered 2013-10-28 – 2013-10-31 (×7): 1300 mg via ORAL
  Filled 2013-10-28 (×8): qty 2

## 2013-10-28 NOTE — Evaluation (Signed)
Physical Therapy Evaluation Patient Details Name: [name] [name] MRN: [id number] DOB: 09-Dec-1955 Today's Date: 10/28/2013   History of Present Illness  [name] [name]  is a 58 y.o. male, with a history of CHF, ESRD, CVA, HTN, seizures who presented with altered mental status and fatigue when his sister saw him this morning at his house. EMS was called and taken to the ED. EMS found his CBG at 29. Patient was started on D50 and now levels have raised to the 400's. Patient stated he is in no discomfort except for mild weakness and is thirsty. Patient had difficulty recalling the details of this morning and yesterday. Patient states that he has been taking his insulin as directed and that he has been eating meals regularly in the past day. He stated that he sister disagrees, and that he has not been compliant.  Clinical Impression   Pt admitted with above. Pt currently with functional limitations due to the deficits listed below (see PT Problem List).  Pt will benefit from skilled PT to increase their independence and safety with mobility to allow discharge to the venue listed below.       Follow Up Recommendations Home health PT;Supervision/Assistance - 24 hour [facility] for chronic disease management)    Equipment Recommendations  3in1 (PT) (if doesn't already have)    Recommendations for Other Services OT consult     Precautions / Restrictions Precautions Precautions: Fall      Mobility  Bed Mobility Overal bed mobility: Needs Assistance Bed Mobility: Supine to Sit     Supine to sit: Min assist     General bed mobility comments: Cues to initiate, HOB elevated; assist to reciprocally scoot to EOB  Transfers Overall transfer level: Needs assistance Equipment used: Rolling walker (2 wheeled) Transfers: Sit to/from Stand Sit to Stand: Mod assist         General transfer comment: mod assist to powerup indicative of LE weakness ; noted decr control of stand to  sit  Ambulation/Gait Ambulation/Gait assistance: Min guard Ambulation Distance (Feet):  (sidesteps at EOB) Assistive device: Rolling walker (2 wheeled)       General Gait Details: Noted decr hip and knee extension bilaterally with steps and upright activity  Stairs            Wheelchair Mobility    Modified Rankin (Stroke Patients Only)       Balance Overall balance assessment: Needs assistance         Standing balance support: Bilateral upper extremity supported Standing balance-Leahy Scale: Poor                               Pertinent Vitals/Pain Pain Assessment: No/denies pain    Home Living Family/patient expects to be discharged to:: Private residence Living Arrangements: Other relatives Available Help at Discharge: Family Type of Home: Apartment Home Access: Stairs to enter   Secretary/administrator of Steps: 16 Home Layout: One level Home Equipment: Cane - single point;Walker - 2 wheels;Wheelchair - manual;Toilet riser;Shower seat      Prior Function Level of Independence: Independent with assistive device(s)         Comments: No longer drives, not working. community ambulator, uses motorized cart when going to the store. States he uses his walker all the time.     Hand Dominance   Dominant Hand: Right    Extremity/Trunk Assessment   Upper Extremity Assessment: Defer to OT evaluation;Generalized weakness  Lower Extremity Assessment: Generalized weakness (muscle atrophy apparent; difficulty extending bil hips and knees)         Communication   Communication: No difficulties  Cognition Arousal/Alertness: Awake/alert Behavior During Therapy: WFL for tasks assessed/performed (annoyance at being bothered) Overall Cognitive Status: Within Functional Limits for tasks assessed                      General Comments      Exercises        Assessment/Plan    PT Assessment Patient needs continued PT  services  PT Diagnosis Difficulty walking;Generalized weakness   PT Problem List Decreased strength;Decreased balance;Decreased activity tolerance;Decreased mobility;Decreased coordination;Decreased knowledge of use of DME;Decreased safety awareness  PT Treatment Interventions DME instruction;Gait training;Stair training;Functional mobility training;Therapeutic activities;Therapeutic exercise;Balance training;Patient/family education   PT Goals (Current goals can be found in the Care Plan section) Acute Rehab PT Goals Patient Stated Goal: did not state PT Goal Formulation: With patient Time For Goal Achievement: 11/04/13 Potential to Achieve Goals: Good    Frequency Min 3X/week   Barriers to discharge Decreased caregiver support Not exactly sure haow much assist is available to Mr. Bourcier at home; he was not very forthcoming with info re: home situation    Co-evaluation               End of Session   Activity Tolerance: Patient tolerated treatment well Patient left: in chair;with call bell/phone within reach Nurse Communication: Mobility status         Time: [id number] PT Time Calculation (min): 17 min   Charges:   PT Evaluation $Initial PT Evaluation Tier I: 1 Procedure PT Treatments $Therapeutic Activity: 8-22 mins   PT G Codes:          Olen PelGarrigan, Doylene Splinter Hamff 10/28/2013, 11:32 AM  Van ClinesHolly Judee Hennick, PT  Acute Rehabilitation Services Pager [phone] Office [phone]

## 2013-10-28 NOTE — Progress Notes (Signed)
[facility] PROGRESS NOTE  [name] [name] ZOX:[id number] DOB: [birth year] DOA: 10/27/2013 PCP: No PCP Per Patient  Assessment/Plan:  Principal Problem:   Hypoglycemia: Resolved. Resume Lantus at lower dose. Continue sliding scale NovoLog. Monitor for 24 hours. Active Problems:   Hypertension  Reported history of CHF (congestive heart failure): No evidence of acute heart failure. Last echocardiogram showed preserved ejection fraction.   H/O: CVA (cerebrovascular accident): PT eval pending.   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   Hyperkalemia resolved. Discontinue telemetry.   History of seizures   Metabolic acidosis   Substance abuse: Not a candidate for hepatitis C treatment at this time. Hepatitis C Rhabdomyolysis: Agree with stopping statin.  Code Status:  full Family Communication:   Disposition Plan:    Consultants:  Nephrology  Procedures:     Antibiotics:    HPI/Subjective: Feels tired. Complaining of interruptions all night and being unable to. No dyspnea. No other complaints.  Objective: Filed Vitals:   10/28/13 0909  BP: 166/67  Pulse: 80  Temp: 98.7 F (37.1 C)  Resp: 20    Intake/Output Summary (Last 24 hours) at 10/28/13 1132 Last data filed at 10/28/13 0653  Gross per 24 hour  Intake 1422.5 ml  Output   1300 ml  Net  122.5 ml   Filed Weights   10/27/13 0833 10/27/13 2049  Weight: 487.163 kg (1074 lb) 50 kg (110 lb 3.7 oz)    Exam:   General:  Asleep. Arousable. Appropriate. Oriented. Cooperative.  Cardiovascular: Regular rate rhythm without murmurs gallops rubs.  Respiratory: Clear to auscultation bilaterally without wheezes rhonchi or rales  Abdomen: Soft nontender nondistended  Ext: No clubbing cyanosis or edema  Basic Metabolic Panel:  Recent Labs Lab 10/27/13 0845 10/27/13 1131 10/27/13 2002 10/28/13 0545  NA 146  --  134* 140  K 5.9* 5.9* 4.1 3.4*  CL 113*  --  101 101  CO2  15*  --  15* 21  GLUCOSE 83  --  168* 159*  BUN 125*  --  109* 104*  CREATININE 6.08*  --  4.98* 4.87*  CALCIUM 9.7  --  7.8* 7.6*  MG 2.7*  --   --  1.9  PHOS  --   --   --  3.7   Liver Function Tests:  Recent Labs Lab 10/27/13 0845 10/28/13 0545  AST 54*  --   ALT 43  --   ALKPHOS 114  --   BILITOT 0.3  --   PROT 9.3*  --   ALBUMIN 3.3* 2.5*   No results found for this basename: LIPASE, AMYLASE,  in the last 168 hours No results found for this basename: AMMONIA,  in the last 168 hours CBC:  Recent Labs Lab 10/27/13 0845 10/27/13 2002 10/28/13 0535  WBC 5.1 7.9 6.2  NEUTROABS 3.4  --  3.8  HGB 16.2 13.5 13.6  HCT 48.9 40.7 41.5  MCV 83.0 79.6 80.9  PLT 174 144* 144*   Cardiac Enzymes:  Recent Labs Lab 10/27/13 0852 10/27/13 1131 10/28/13 0535  CKTOTAL  --  2447* 1880*  TROPONINI <0.30  --   --    BNP (last 3 results) No results found for this basename: PROBNP,  in the last 8760 hours CBG:  Recent Labs Lab 10/27/13 2005 10/27/13 2125 10/28/13 0230 10/28/13 0545 10/28/13 0832  GLUCAP 199* 186* 268* 181* 163*    No results found for this or any previous visit (from the past 240 hour(s)).  Studies: Dg Chest 2 View  10/27/2013   CLINICAL DATA:  Hypoglycemia.  EXAM: CHEST  2 VIEW  COMPARISON:  10/06/2013.  FINDINGS: The lungs are clear without focal infiltrate, edema, pneumothorax or pleural effusion. The cardiopericardial silhouette is within normal limits for size. Imaged bony structures of the thorax are intact.  IMPRESSION: Interval resolution of left lung airspace disease. No acute cardiopulmonary findings on today's study.   Electronically Signed   By: Kennith CenterEric  Mansell M.D.   On: 10/27/2013 09:07   Ct Head Wo Contrast  10/27/2013   CLINICAL DATA:  Altered level of consciousness.  EXAM: CT HEAD WITHOUT CONTRAST  TECHNIQUE: Contiguous axial images were obtained from the base of the skull through the vertex without intravenous contrast.  COMPARISON:   Head CT scan 10/06/2013.  FINDINGS: Scattered areas of hypoattenuation in the subcortical and periventricular deep white matter are again seen consistent chronic microvascular ischemic change. The brain is mildly atrophic. No evidence of acute abnormality including infarct, hemorrhage, mass lesion, mass effect, midline shift or abnormal extra-axial fluid collection is identified. There is no hydrocephalus or pneumocephalus. The calvarium is intact.  IMPRESSION: No acute finding.  Stable compared to prior exam.   Electronically Signed   By: Drusilla Kannerhomas  Dalessio M.D.   On: 10/27/2013 09:18    Scheduled Meds: . sodium chloride   Intravenous STAT  . amLODipine  10 mg Oral Daily  . aspirin EC  81 mg Oral Daily  . calcium acetate  667 mg Oral TID WC  . carvedilol  12.5 mg Oral BID WC  . docusate sodium  100 mg Oral BID  . heparin  5,000 Units Subcutaneous 3 times per day  . insulin aspart  0-9 Units Subcutaneous Q4H  . insulin glargine  5 Units Subcutaneous QHS  . sodium bicarbonate  1,300 mg Oral BID  . sodium chloride  3 mL Intravenous Q12H  . thiamine  100 mg Oral q morning - 10a   Continuous Infusions:   Time spent: 35 minutes  [name] [name] L  [facility] Pager [phone]. If 7PM-7AM, please contact night-coverage at [email or web address], password [email or web address] 10/28/2013, 11:32 AM  LOS: 1 day

## 2013-10-28 NOTE — ED Provider Notes (Signed)
Medical screening examination/treatment/procedure(s) were conducted as a shared visit with non-physician practitioner(s) and myself.  I personally evaluated the patient during the encounter.   EKG Interpretation   Date/Time:  Monday October 27 2013 08:27:12 EDT Ventricular Rate:  65 PR Interval:  173 QRS Duration: 131 QT Interval:  493 QTC Calculation: 513 R Axis:   90 Text Interpretation:  Sinus rhythm RBBB and LPFB No significant change  since last tracing Confirmed by Brynn Reznik  MD, Lennette Bihari ([id number]) on 10/27/2013  8:38:02 AM      Pt with recurrent hypoglycemia, cri w mild aki, recent decreased po intake and gen weakness.  Labs.  Chest cta. Rrr. abd soft nt.  Given recurrent hypoglycemia, worsening cri, met acidosis, med service contacted for admission.    Mirna Mires, MD 10/28/13 1116

## 2013-10-28 NOTE — Progress Notes (Signed)
INITIAL NUTRITION ASSESSMENT  DOCUMENTATION CODES Per approved criteria  -Underweight   INTERVENTION: Immunologistrovide Resource Breeze once daily Provide Ensure Pudding once daily RD to continue to monitor for PO adequacy Recommend daily multivitamin  NUTRITION DIAGNOSIS: Increased nutrient needs related to underweight status as evidenced by estimated energy/protein needs.   Goal: Pt to meet >/= 90% of their estimated nutrition needs   Monitor:  PO intake, weight trend, labs  Reason for Assessment: Positive Malnutrition Screening Tool (MST) score of 3  58 y.o. male  Admitting Dx: Hypoglycemia  ASSESSMENT: 58 y.o. male, with a history of CHF, ESRD, CVA, HTN, seizures who presented with altered mental status and fatigue when his sister saw him this morning at his house. EMS was called and taken to the ED. EMS found his CBG at 29. In the ER he was found to be hyperkalemia with an elevated CK.  HD fistula was created approximately 2 months ago, but as of yet he has not started HD.   Per MST score in chart pt reported weight loss of 14 to 23 lbs and eating poorly due to decreased appetite PTA. Per MD note, pt has been eating and drinking well. Per nursing notes pt consumed 100% of lunch yesterday. Based on Pt's BMI he is underweight but, per weight history below pt's weight has been stable for the past month. Labs reviewed: elevated glucose, low potassium, high BUN and creatinine, low albumin, low GFR, low calcium  Height: Ht Readings from Last 1 Encounters:  10/27/13 5\' 7"  (1.702 m)    Weight: Wt Readings from Last 1 Encounters:  10/27/13 110 lb 3.7 oz (50 kg)    Ideal Body Weight: 148 lb  % Ideal Body Weight: 74%  Wt Readings from Last 10 Encounters:  10/27/13 110 lb 3.7 oz (50 kg)  10/06/13 110 lb (49.896 kg)  10/01/13 112 lb (50.803 kg)  09/12/13 106 lb 11.2 oz (48.4 kg)  09/12/13 106 lb 11.2 oz (48.4 kg)    Usual Body Weight: unknown  % Usual Body Weight: NA  BMI:   Body mass index is 17.26 kg/(m^2). (Underweight)  Estimated Nutritional Needs: Kcal: 1750-2000 Protein: 50-60 grams Fluid: 1.2 L restriction per MD  Skin: intact  Diet Order: Diabetic  EDUCATION NEEDS: -No education needs identified at this time   Intake/Output Summary (Last 24 hours) at 10/28/13 1507 Last data filed at 10/28/13 0653  Gross per 24 hour  Intake 1422.5 ml  Output   1300 ml  Net  122.5 ml    Last BM: 9/28   Labs:   Recent Labs Lab 10/27/13 0845 10/27/13 1131 10/27/13 2002 10/28/13 0545  NA 146  --  134* 140  K 5.9* 5.9* 4.1 3.4*  CL 113*  --  101 101  CO2 15*  --  15* 21  BUN 125*  --  109* 104*  CREATININE 6.08*  --  4.98* 4.87*  CALCIUM 9.7  --  7.8* 7.6*  MG 2.7*  --   --  1.9  PHOS  --   --   --  3.7  GLUCOSE 83  --  168* 159*    CBG (last 3)   Recent Labs  10/28/13 0545 10/28/13 0832 10/28/13 1153  GLUCAP 181* 163* 305*    Scheduled Meds: . sodium chloride   Intravenous STAT  . amLODipine  10 mg Oral Daily  . aspirin EC  81 mg Oral Daily  . calcium acetate  667 mg Oral TID WC  .  carvedilol  12.5 mg Oral BID WC  . docusate sodium  100 mg Oral BID  . heparin  5,000 Units Subcutaneous 3 times per day  . insulin aspart  0-9 Units Subcutaneous Q4H  . insulin glargine  5 Units Subcutaneous QHS  . sodium bicarbonate  1,300 mg Oral BID  . sodium chloride  3 mL Intravenous Q12H  . thiamine  100 mg Oral q morning - 10a    Continuous Infusions:   Past Medical History  Diagnosis Date  . Hypertension   . CHF (congestive heart failure)   . Seizures   . Tuberculosis 1960's    "got the tx for it" (10/27/2013)  . Type II diabetes mellitus   . Hepatitis C   . Stroke 06/2013    "LLE paralyzed since" (10/27/2013)  . Arthritis     "left knee hurts" (10/27/2013)  . Chronic kidney disease (CKD), stage IV (severe)     Past Surgical History  Procedure Laterality Date  . Av fistula placement Left 09/11/2013    Procedure: Insertion of  arteriovenous gortex graft;  Surgeon: Chuck Hint, MD;  Location: [facility] OR;  Service: Vascular;  Laterality: Left;  . Laceration repair Left ?2014    "cut my leg falling off a truck"  . Loop recorder implant  05/2012    Ian Malkin RD, LDN Inpatient Clinical Dietitian Pager: [phone] After Hours Pager: [phone]

## 2013-10-28 NOTE — Progress Notes (Signed)
I have seen and examined this patient and agree with the plan of care   [name] [name] W 10/28/2013, 11:45 AM  

## 2013-10-28 NOTE — Progress Notes (Signed)
[facility] Rounding Note   Subjective: Pt seen and examined in AM. No acute events overnight. Pt reports feeling better today. He reports good urine output (1.3 L) with no hematuria.  He denies dyspnea, LE swelling, chest pain, nausea, vomiting, abdominal pain, or change in BM. He has been eating and drinking well. He denies muscle cramping/pain at home.   Objective:    General: Resting comfortably in bed Cardiac: Regular rate and rhythm Pulmonary: Lungs clear to auscultation bilaterally with no wheezing, ronchi, or rales GI: Abdomen soft, non-tender, non-distended with normal BS Extremities: No peripheral edema  Neuro: A & O x 3   Vital signs in last 24 hours: Filed Vitals:   10/27/13 1544 10/27/13 1800 10/27/13 2049 10/28/13 0534  BP: 181/89 186/81 177/82 172/76  Pulse: 73 72 80 77  Temp: 98 F (36.7 C) 98 F (36.7 C) 98 F (36.7 C) 98.5 F (36.9 C)  TempSrc: Oral Oral Oral Oral  Resp: 18 18 18 18   Height:      Weight:   110 lb 3.7 oz (50 kg)   SpO2: 100% 100% 100% 96%   Weight change:   Intake/Output Summary (Last 24 hours) at 10/28/13 0748 Last data filed at 10/28/13 0653  Gross per 24 hour  Intake 1497.5 ml  Output   1300 ml  Net  197.5 ml    Physical Exam:  Blood pressure 172/76, pulse 77, temperature 98.5 F (36.9 C), temperature source Oral, resp. rate 18, height 5\' 7"  (1.702 m), weight 110 lb 3.7 oz (50 kg), SpO2 96.00%.  Labs:   Recent Labs Lab 10/27/13 0845 10/27/13 1131 10/27/13 2002 10/28/13 0545  NA 146  --  134* 140  K 5.9* 5.9* 4.1 3.4*  CL 113*  --  101 101  CO2 15*  --  15* 21  GLUCOSE 83  --  168* 159*  BUN 125*  --  109* 104*  CREATININE 6.08*  --  4.98* 4.87*  CALCIUM 9.7  --  7.8* 7.6*  PHOS  --   --   --  3.7     Recent Labs Lab 10/27/13 0845 10/28/13 0545  AST 54*  --   ALT 43  --   ALKPHOS 114  --   BILITOT 0.3  --   PROT 9.3*  --   ALBUMIN 3.3* 2.5*   No results found for this basename: LIPASE,  AMYLASE,  in the last 168 hours No results found for this basename: AMMONIA,  in the last 168 hours   Recent Labs Lab 10/27/13 0845 10/27/13 2002 10/28/13 0535  WBC 5.1 7.9 6.2  NEUTROABS 3.4  --  3.8  HGB 16.2 13.5 13.6  HCT 48.9 40.7 41.5  MCV 83.0 79.6 80.9  PLT 174 144* 144*    @LABRCNTIP (inr:5)  )  Recent Labs Lab 10/27/13 0852 10/27/13 1131 10/28/13 0535  CKTOTAL  --  2447* 1880*  TROPONINI <0.30  --   --      Recent Labs Lab 10/27/13 1721 10/27/13 2005 10/27/13 2125 10/28/13 0230 10/28/13 0545  GLUCAP 169* 199* 186* 268* 181*     No results found for this basename: IRON, TIBC, TRANSFERRIN, FERRITIN,  in the last 168 hours  Studies/Results: Dg Chest 2 View  10/27/2013   CLINICAL DATA:  Hypoglycemia.  EXAM: CHEST  2 VIEW  COMPARISON:  10/06/2013.  FINDINGS: The lungs are clear without focal infiltrate, edema, pneumothorax or pleural effusion. The cardiopericardial silhouette is within normal limits for size.  Imaged bony structures of the thorax are intact.  IMPRESSION: Interval resolution of left lung airspace disease. No acute cardiopulmonary findings on today's study.   Electronically Signed   By: Kennith Center M.D.   On: 10/27/2013 09:07   Ct Head Wo Contrast  10/27/2013   CLINICAL DATA:  Altered level of consciousness.  EXAM: CT HEAD WITHOUT CONTRAST  TECHNIQUE: Contiguous axial images were obtained from the base of the skull through the vertex without intravenous contrast.  COMPARISON:  Head CT scan 10/06/2013.  FINDINGS: Scattered areas of hypoattenuation in the subcortical and periventricular deep white matter are again seen consistent chronic microvascular ischemic change. The brain is mildly atrophic. No evidence of acute abnormality including infarct, hemorrhage, mass lesion, mass effect, midline shift or abnormal extra-axial fluid collection is identified. There is no hydrocephalus or pneumocephalus. The calvarium is intact.  IMPRESSION: No acute  finding.  Stable compared to prior exam.   Electronically Signed   By: Drusilla Kanner M.D.   On: 10/27/2013 09:18    . dextrose 5 % 1,000 mL with sodium bicarbonate 150 mEq infusion 75 mL/hr at 10/28/13 0352   . sodium chloride   Intravenous STAT  . amLODipine  10 mg Oral Daily  . aspirin EC  81 mg Oral Daily  . calcium acetate  667 mg Oral TID WC  . carvedilol  12.5 mg Oral BID WC  . docusate sodium  100 mg Oral BID  . heparin  5,000 Units Subcutaneous 3 times per day  . insulin aspart  0-9 Units Subcutaneous Q4H  . sodium chloride  3 mL Intravenous Q12H  . thiamine  100 mg Oral q morning - 10a     I  have reviewed scheduled and prn medications.  Background: [name] [name] is an 58 y.o. male with past medical history of CKD Stage IV with recent AVG placement, hypertension, insulin-dependent Type II DM (last A1 11.4), CVA with residual left hemiparesis, seizure disorder (?), and newly diagnosed HCV infection who we are consulted for in regards to AKI on CKD Stage IV/V.   Recommendations:   Nonoliguric AKI on CKD Stage V - Cr improved to 4.87 after IV fluids from Cr 6 on admission, above last Cr of 4 on 10/06/13.  Etiology most likely pre-renal azotemia (BUN:Cr>20) due to volume depletion vs ATN due to mild rhabdomyolysis with myoglobinuria and elevated CK on admission of 2447 (possible seizure activity vs statin use (at home on atorvastatin 80 mg daily). CK improved today  to 1880, continue to hold home atorvastatin 80 mg daily in setting of elevated CK level. UA with proteinuria and protein: Cr ratio with 2.44 g. Last renal US on 09/09/13 with chronic renal disease. Pt with uncontrolled Type II DM (A1c 11.4) and newly diagnosed HCV infection. Continue IV fluids with NS for hydration.  Monitor renal function panel daily, strict I/O's, daily weights, and avoidance of nephrotoxins. Pt has functioning left AVG in place (August 2015) that is maturing. Due to improving renal function, no HD at  this time. Needs to follow-up with Dr. Seymour Bars as outpatient.  Hypokalemia/Hyperkalemia - K 3.4 today was 5.9 on admission in setting of AKI on CKD and mild rhabdomyolysis. Caution with replacement in setting of renal failure. Obtain Mg level.  Anion-gap Metabolic Acidosis -  AG 18 with normal bicarbonate 21 in setting of AKI on CKD. UA with no ketones. S/p IV bicarbonate administration. Will start PO sodium bicarbonate 1300 mg BID and continue on discharge. Hypertension -  Currently hypertensive (172/76). Hold home lasix 80 mg daily in setting of volume depletion. Continue home carvedilol 12.5 mg BID and amlodipine 10 mg daily. Avoid ACEi/ARBs.  Hypoglycemia/hyperglycemia in setting of insulin-dependent Type II DM - Last A1c 9.8 on 10/27/13.. No ketones on UA. Management per primary team.  History of hyperphosphatemia - Phos level normal (3.7) and normal corrected Ca (8.8). Continue phoslo 667 mg TID. Pt with normal PTH level (70.3) on 08/31/13, does not need to be on calcitriol (pt reports not taking calcitriol 0.25 mcg daily). Chronic normocytic anemia - Hg 13.6 (most likely hemeconcentrated) with baseline Hg 11-13. Iron sat low at 5% on 8/11. Follow-up anemia panel. Last screening colonoscopy?  Newly diagnosed HCV infection - F/U HCV viral load and C3/C4 levels. Pt needs outpatient follow with Hepatitis clinic (however pt with drug abuse which will hinder possible treatment). Chronically elevated AST level with no stigmata of chronic liver disease.  Drug Abuse - UDS positive for THC. Pt denies use, counsel on cessation.   Otis BraceMarjan Corryn Madewell, MD PGY-II IMTS  Pager: [phone]

## 2013-10-29 DIAGNOSIS — M79609 Pain in unspecified limb: Secondary | ICD-10-CM

## 2013-10-29 DIAGNOSIS — M7989 Other specified soft tissue disorders: Secondary | ICD-10-CM

## 2013-10-29 DIAGNOSIS — M6282 Rhabdomyolysis: Secondary | ICD-10-CM

## 2013-10-29 DIAGNOSIS — N189 Chronic kidney disease, unspecified: Secondary | ICD-10-CM

## 2013-10-29 DIAGNOSIS — I10 Essential (primary) hypertension: Secondary | ICD-10-CM

## 2013-10-29 LAB — PROTIME-INR
INR: 1.22 (ref 0.00–1.49)
Prothrombin Time: 15.4 seconds — ABNORMAL HIGH (ref 11.6–15.2)

## 2013-10-29 LAB — GLUCOSE, CAPILLARY
GLUCOSE-CAPILLARY: 72 mg/dL (ref 70–99)
GLUCOSE-CAPILLARY: 105 mg/dL — AB (ref 70–99)
GLUCOSE-CAPILLARY: 127 mg/dL — AB (ref 70–99)
GLUCOSE-CAPILLARY: 212 mg/dL — AB (ref 70–99)
GLUCOSE-CAPILLARY: 210 mg/dL — AB (ref 70–99)
Glucose-Capillary: 228 mg/dL — ABNORMAL HIGH (ref 70–99)
Glucose-Capillary: 178 mg/dL — ABNORMAL HIGH (ref 70–99)

## 2013-10-29 LAB — HCV RNA QUANT
HCV Quantitative Log: 7.02 {Log} — ABNORMAL HIGH (ref <1.18)
HCV Quantitative: 10477392 IU/mL — ABNORMAL HIGH (ref <15)

## 2013-10-29 LAB — C4 COMPLEMENT: COMPLEMENT C4, BODY FLUID: 21 mg/dL (ref 10–40)

## 2013-10-29 LAB — LIPID PANEL
Cholesterol: 109 mg/dL (ref 0–200)
HDL: 49 mg/dL (ref >39)
LDL CALC: 45 mg/dL (ref 0–99)
Total CHOL/HDL Ratio: 2.2 RATIO
Triglycerides: 77 mg/dL (ref <150)
VLDL: 15 mg/dL (ref 0–40)

## 2013-10-29 LAB — RENAL FUNCTION PANEL
Albumin: 2.4 g/dL — ABNORMAL LOW (ref 3.5–5.2)
Anion gap: 12 (ref 5–15)
BUN: 101 mg/dL — ABNORMAL HIGH (ref 6–23)
CHLORIDE: 102 meq/L (ref 96–112)
CO2: 24 meq/L (ref 19–32)
CREATININE: 4.68 mg/dL — AB (ref 0.50–1.35)
Calcium: 8.1 mg/dL — ABNORMAL LOW (ref 8.4–10.5)
GFR, EST AFRICAN AMERICAN: 15 mL/min — AB (ref >90)
GFR, EST NON AFRICAN AMERICAN: 13 mL/min — AB (ref >90)
GLUCOSE: 105 mg/dL — AB (ref 70–99)
Phosphorus: 3.9 mg/dL (ref 2.3–4.6)
Potassium: 4.1 mEq/L (ref 3.7–5.3)
Sodium: 138 mEq/L (ref 137–147)

## 2013-10-29 LAB — C3 COMPLEMENT: C3 Complement: 89 mg/dL — ABNORMAL LOW (ref 90–180)

## 2013-10-29 LAB — CK: Total CK: 758 U/L — ABNORMAL HIGH (ref 7–232)

## 2013-10-29 MED ORDER — INSULIN ASPART 100 UNIT/ML ~~LOC~~ SOLN: 2.0000 [IU] | Freq: Three times a day (TID) | SUBCUTANEOUS | Status: DC

## 2013-10-29 MED ORDER — WARFARIN - PHARMACIST DOSING INPATIENT
Freq: Every day | Status: DC
  Administered 2013-10-29: 22:00:00

## 2013-10-29 MED ORDER — SODIUM BICARBONATE 650 MG PO TABS
1300.0000 mg | ORAL_TABLET | Freq: Two times a day (BID) | ORAL | Status: AC
Start: 2013-10-29 — End: ?

## 2013-10-29 MED ORDER — WARFARIN VIDEO
Freq: Once | Status: AC
  Administered 2013-10-30: 13:00:00

## 2013-10-29 MED ORDER — HYDROCODONE-ACETAMINOPHEN 5-325 MG PO TABS
1.0000 | ORAL_TABLET | Freq: Four times a day (QID) | ORAL | Status: DC | PRN
Start: 2013-10-29 — End: 2013-10-31
  Administered 2013-10-29 – 2013-10-31 (×4): 1 via ORAL
  Filled 2013-10-29 (×4): qty 1

## 2013-10-29 MED ORDER — INSULIN ASPART 100 UNIT/ML ~~LOC~~ SOLN
0.0000 [IU] | Freq: Three times a day (TID) | SUBCUTANEOUS | Status: DC
  Administered 2013-10-29 (×2): 3 [IU] via SUBCUTANEOUS
  Administered 2013-10-30: 1 [IU] via SUBCUTANEOUS
  Administered 2013-10-30: 5 [IU] via SUBCUTANEOUS
  Administered 2013-10-30 – 2013-10-31 (×2): 2 [IU] via SUBCUTANEOUS
  Administered 2013-10-31: 1 [IU] via SUBCUTANEOUS

## 2013-10-29 MED ORDER — COUMADIN BOOK
Freq: Once | Status: AC
  Administered 2013-10-29: 1
  Filled 2013-10-29: qty 1

## 2013-10-29 MED ORDER — ENOXAPARIN SODIUM 60 MG/0.6ML ~~LOC~~ SOLN
50.0000 mg | SUBCUTANEOUS | Status: DC
  Administered 2013-10-29 – 2013-10-30 (×2): 50 mg via SUBCUTANEOUS
  Filled 2013-10-29 (×3): qty 0.6

## 2013-10-29 MED ORDER — DOXYCYCLINE HYCLATE 100 MG PO TABS
100.0000 mg | ORAL_TABLET | Freq: Two times a day (BID) | ORAL | Status: DC
  Administered 2013-10-29: 100 mg via ORAL
  Filled 2013-10-29 (×3): qty 1

## 2013-10-29 MED ORDER — CARVEDILOL 25 MG PO TABS
25.0000 mg | ORAL_TABLET | Freq: Two times a day (BID) | ORAL | Status: DC
  Administered 2013-10-29 – 2013-10-31 (×4): 25 mg via ORAL
  Filled 2013-10-29 (×6): qty 1

## 2013-10-29 MED ORDER — INSULIN GLARGINE 100 UNIT/ML ~~LOC~~ SOLN: 8.0000 [IU] | Freq: Every day | SUBCUTANEOUS | Status: DC

## 2013-10-29 MED ORDER — WARFARIN SODIUM 5 MG PO TABS
5.0000 mg | ORAL_TABLET | Freq: Once | ORAL | Status: AC
  Administered 2013-10-29: 5 mg via ORAL
  Filled 2013-10-29: qty 1

## 2013-10-29 NOTE — Progress Notes (Signed)
Doppler RUE shows DVT. Will start lovenox and coumadin. Not a candidate for NOACs due to CKD  Crista Curborinna Sivan Quast, MD

## 2013-10-29 NOTE — Progress Notes (Addendum)
[facility] PROGRESS NOTE  [name] [name] ZOX:[id number] DOB: 1955/07/01 DOA: 10/27/2013 PCP: patient can't remember  Summary: [age] AA male with h/o CKD V not yet on dialysis found unresponsive and CBG 29, CPK 2447, creat 6 (baseline 4), potassium 5.9. Hyperkalemia treated medically and nephrology consulted.  Assessment/Plan:  Principal Problem:   Hypoglycemia: Resolved.  CBGs ok on lower dose lantus and SSI. Likely due to worsening renal function  Active Problems: Right arm swelling and pain: cellutitis v. DVT. Start doxycycline and check doppler. Take out IV in that arm.  elevate    Hypertension: BPs high. Will increase coreg   Reported history of CHF (congestive heart failure): No evidence of acute heart failure. Last echocardiogram showed preserved ejection fraction.    H/O: CVA (cerebrovascular accident): PT rec home PT, 3 in 1 commode. Family able to provide 24 supervision    Near End stage renal disease: No indication for dialysis currently. Lasix held. Will need recs from nephrology whether to resume. Creatinine improved.  Has left AVF    Hyperkalemia resolved. Discontinue telemetry.    History of seizures    Metabolic acidosis    Substance abuse: Not a candidate for hepatitis C treatment at this time.  Hepatitis C  Rhabdomyolysis: statin stopped  Code Status:  full Family Communication:  Sister by phone Disposition Plan:  Home with PT RN  Consultants:  Nephrology  Procedures:     Antibiotics:  Doxy 9/30  HPI/Subjective: Right arm pain and swelling started yesterday. No other complaints  Objective: Filed Vitals:   10/29/13 0537  BP: 167/73  Pulse: 81  Temp: 99.7 F (37.6 C)  Resp: 18    Intake/Output Summary (Last 24 hours) at 10/29/13 0922 Last data filed at 10/29/13 0646  Gross per 24 hour  Intake     60 ml  Output    601 ml  Net   -541 ml   Filed Weights   10/27/13 0833 10/27/13 2049  Weight: 487.163 kg (1074 lb) 50 kg (110  lb 3.7 oz)    Exam:   General:  Alert, oriented  Cardiovascular: Regular rate rhythm without murmurs gallops rubs.  Respiratory: Clear to auscultation bilaterally without wheezes rhonchi or rales  Abdomen: Soft nontender nondistended  Ext: right arm warm, tender, erythematous, tender. Saline lock in forearm  Basic Metabolic Panel:  Recent Labs Lab 10/27/13 0845 10/27/13 1131 10/27/13 2002 10/28/13 0545 10/29/13 0605  NA 146  --  134* 140 138  K 5.9* 5.9* 4.1 3.4* 4.1  CL 113*  --  101 101 102  CO2 15*  --  15* 21 24  GLUCOSE 83  --  168* 159* 105*  BUN 125*  --  109* 104* 101*  CREATININE 6.08*  --  4.98* 4.87* 4.68*  CALCIUM 9.7  --  7.8* 7.6* 8.1*  MG 2.7*  --   --  1.9  --   PHOS  --   --   --  3.7 3.9   Liver Function Tests:  Recent Labs Lab 10/27/13 0845 10/28/13 0545 10/29/13 0605  AST 54*  --   --   ALT 43  --   --   ALKPHOS 114  --   --   BILITOT 0.3  --   --   PROT 9.3*  --   --   ALBUMIN 3.3* 2.5* 2.4*   No results found for this basename: LIPASE, AMYLASE,  in the last 168 hours No results found for this basename: AMMONIA,  in  the last 168 hours CBC:  Recent Labs Lab 10/27/13 0845 10/27/13 2002 10/28/13 0535  WBC 5.1 7.9 6.2  NEUTROABS 3.4  --  3.8  HGB 16.2 13.5 13.6  HCT 48.9 40.7 41.5  MCV 83.0 79.6 80.9  PLT 174 144* 144*   Cardiac Enzymes:  Recent Labs Lab 10/27/13 0852 10/27/13 1131 10/28/13 0535 10/29/13 0605  CKTOTAL  --  2447* 1880* 758*  TROPONINI <0.30  --   --   --    BNP (last 3 results) No results found for this basename: PROBNP,  in the last 8760 hours CBG:  Recent Labs Lab 10/28/13 2010 10/28/13 2156 10/29/13 0205 10/29/13 0336 10/29/13 0556  GLUCAP 244* 213* 72 105* 127*    No results found for this or any previous visit (from the past 240 hour(s)).   Studies: No results found.  Scheduled Meds: . amLODipine  10 mg Oral Daily  . aspirin EC  81 mg Oral Daily  . calcium acetate  667 mg Oral TID  WC  . carvedilol  12.5 mg Oral BID WC  . docusate sodium  100 mg Oral BID  . doxycycline  100 mg Oral Q12H  . feeding supplement (ENSURE)  1 Container Oral Q24H  . feeding supplement (RESOURCE BREEZE)  1 Container Oral Q24H  . heparin  5,000 Units Subcutaneous 3 times per day  . insulin aspart  0-9 Units Subcutaneous TID WC  . insulin glargine  5 Units Subcutaneous QHS  . sodium bicarbonate  1,300 mg Oral BID  . sodium chloride  3 mL Intravenous Q12H  . thiamine  100 mg Oral q morning - 10a   Continuous Infusions:   Time spent: 35 minutes  [name] [name]  [facility] Pager [phone]. If 7PM-7AM, please contact night-coverage at [email or web address], password [facility] 10/29/2013, 9:22 AM  LOS: 2 days

## 2013-10-29 NOTE — Progress Notes (Signed)
Pt given Coumadin book. Reviewed book with pt. Answered all questions. Pt verbalized understanding.

## 2013-10-29 NOTE — Progress Notes (Signed)
Occupational Therapy Evaluation Patient Details Name: [name] [name] MRN: [id number] DOB: 1955/12/07 Today's Date: 10/29/2013    History of Present Illness [name] [name] is a [name] admitted 10/27/13 with altered mental status and fatigue. EMS found pt's CBG to be 29 upon arrival. Following D50 levels raised to the 400's. Pt with PMH of CHF, ESRD, CVA, HTN, seizures.    Clinical Impression   PTA pt lived at home with his sister, who works during the day. Pt reports that he was independent with ADLs, however this is unconfirmed by family. Concerned about pt's safety with return home due to lack of 24/7 Supervision and level of assist required currently. Feel that pt would benefit from SNF for ST Rehab prior to return home. Pt will benefit from acute OT to address self-care and increase strength and endurance for increased independence.     Follow Up Recommendations  SNF;Supervision/Assistance - 24 hour    Equipment Recommendations  None recommended by OT    Recommendations for Other Services       Precautions / Restrictions Precautions Precautions: Fall Restrictions Weight Bearing Restrictions: No      Mobility Bed Mobility Overal bed mobility: Needs Assistance Bed Mobility: Rolling;Supine to Sit;Sit to Supine Rolling: Mod assist   Supine to sit: Mod assist Sit to supine: Mod assist   General bed mobility comments: Pt required assist due to weakness and painful RUE. Pt required more assist to roll to L side due to RUE pain.    Transfers                 General transfer comment: Pt declined OOB activities.     Balance Overall balance assessment: Needs assistance Sitting-balance support: Single extremity supported;Feet supported Sitting balance-Leahy Scale: Poor Sitting balance - Comments: Pt with poor postural control and requires assist to sit EOB. Pt tolerated WB through elbow on RUE despite pain and swelling.                                      ADL Overall ADL's : Needs assistance/impaired Eating/Feeding: Set up;Sitting Eating/Feeding Details (indicate cue type and reason): pt requires assist to open containers and cut food due to RUE pain and edema.  Grooming: Set up (supported sitting EOB) Grooming Details (indicate cue type and reason): Pt was able to use LUE to wipe face in supported EOB sitting, however poor postural trunk control Upper Body Bathing: Sitting;Moderate assistance   Lower Body Bathing: Maximal assistance;Sitting/lateral leans   Upper Body Dressing : Sitting;Maximal assistance   Lower Body Dressing: Total assistance;Sitting/lateral leans                 General ADL Comments: Pt annoyed during OT session and reports that he just wants to sleep. Educated pt on importance of getting OOB and participating in therapy to increase strength and activity tolerance. Pt declined getting OOB and sat EOB with assistance. Pt had BM and did not appear to recognize or acknowledge this.       Vision                 Additional Comments: Difficult to assess due to cognition and limited pt participation. To be further assessed in functional context.   Perception Perception Perception Tested?: No   Praxis Praxis Praxis tested?: Within functional limits    Pertinent Vitals/Pain Pain Assessment: Faces Faces Pain Scale: Hurts even more Pain  Location: RUE; edematous and painful to touch throughout Pain Intervention(s): Limited activity within patient's tolerance;Monitored during session;Repositioned     Hand Dominance Right   Extremity/Trunk Assessment Upper Extremity Assessment Upper Extremity Assessment: RUE deficits/detail;Generalized weakness RUE Deficits / Details: edematous throughout RUE and painful to touch; decreased ROM due to edema. Pt able to bend digits 1/3 range and straighted. Trace wrist movement. However pt was able to WB through elbow in EOB sitting.  RUE: Unable to fully assess  due to pain   Lower Extremity Assessment Lower Extremity Assessment: Generalized weakness   Cervical / Trunk Assessment Cervical / Trunk Assessment: Kyphotic (head forward)   Communication Communication Communication: No difficulties   Cognition Arousal/Alertness: Awake/alert Behavior During Therapy: WFL for tasks assessed/performed (annoyed) Overall Cognitive Status: Impaired/Different from baseline Area of Impairment: Safety/judgement;Problem solving;Attention;Awareness;Following commands   Current Attention Level: Selective   Following Commands: Follows one step commands inconsistently;Follows one step commands with increased time Safety/Judgement: Decreased awareness of safety;Decreased awareness of deficits Awareness: Emergent Problem Solving: Slow processing;Decreased initiation;Requires verbal cues;Requires tactile cues General Comments: Pt annoyed with therapist requesting him to participate in therapy.               Home Living Family/patient expects to be discharged to:: Private residence Living Arrangements: Other relatives (sister) Available Help at Discharge: Family (sister works) Type of Home: Apartment Home Access: Stairs to enter Secretary/administrator of Steps: 16   Home Layout: One level     Bathroom Shower/Tub: Producer, television/film/video: Standard     Home Equipment: Cane - single point;Walker - 2 wheels;Wheelchair - manual;Toilet riser;Shower seat          Prior Functioning/Environment Level of Independence: Independent with assistive device(s)        Comments: No longer drives, not working. community ambulator, uses motorized cart when going to the store. States he uses his walker all the time.    OT Diagnosis: Generalized weakness;Cognitive deficits;Acute pain   OT Problem List: Decreased strength;Decreased range of motion;Decreased activity tolerance;Impaired balance (sitting and/or standing);Decreased cognition;Decreased  coordination;Decreased safety awareness;Decreased knowledge of use of DME or AE;Decreased knowledge of precautions;Impaired sensation;Impaired UE functional use;Pain;Increased edema   OT Treatment/Interventions: Self-care/ADL training;Therapeutic exercise;Energy conservation;DME and/or AE instruction;Therapeutic activities;Cognitive remediation/compensation;Patient/family education;Balance training    OT Goals(Current goals can be found in the care plan section) Acute Rehab OT Goals Patient Stated Goal: did not state OT Goal Formulation: With patient Time For Goal Achievement: 11/12/13 Potential to Achieve Goals: Fair ADL Goals Pt Will Perform Eating: Independently;sitting Pt Will Perform Grooming: standing;with supervision Pt Will Perform Lower Body Bathing: with supervision;with set-up;sit to/from stand Pt Will Perform Lower Body Dressing: with set-up;with supervision;sit to/from stand Pt Will Transfer to Toilet: with supervision;ambulating Pt Will Perform Toileting - Clothing Manipulation and hygiene: with supervision;sit to/from stand Pt/caregiver will Perform Home Exercise Program: Increased ROM;Increased strength;Both right and left upper extremity;With Supervision Additional ADL Goal #1: Pt will verbalize 3/3 edema control techniques for RUE Independently to promote independence with ADLs.   OT Frequency: Min 2X/week              End of Session Nurse Communication: Other (comment) (RUE edema and pain)  Activity Tolerance: Patient limited by fatigue;Patient limited by pain Patient left: in bed;with call bell/phone within reach   Time: 1610-9604 OT Time Calculation (min): 26 min Charges:  OT General Charges $OT Visit: 1 Procedure OT Evaluation $Initial OT Evaluation Tier I: 1 Procedure OT Treatments $Self Care/Home Management : 8-22 mins $  Therapeutic Activity: 8-22 mins  Rae LipsMiller, Deashia Soule M 10/29/2013, 11:43 AM  Carney LivingLeeAnn Marie Asucena Galer, OTR/L Occupational Therapist [phone]  (pager)

## 2013-10-29 NOTE — Progress Notes (Signed)
CARE MANAGEMENT NOTE 10/29/2013  Patient:  [name] [name],[name] [name]   Account Number:  [id number]  Date Initiated:  10/28/2013  Documentation initiated by:  Darlyne RussianOLAND,Shenice Dolder  Subjective/Objective Assessment:   admitted with hypoglycemia, hypokalemia, chronic kidney disease  lives at home with sister     Action/Plan:   active with [facility]:  Rn, PT   Anticipated DC Date:  10/29/2013   Anticipated DC Plan:  HOME W HOME HEALTH SERVICES      DC Planning Services  CM consult      [facility]  Resumption Of Svcs/PTA Provider   Choice offered to / List presented to:          [facility]  [facility] RN  HH[facility]2 PT  HH[facility]3 OT      [facility]  [facility]   Status of service:  In process, will continue to follow Medicare Important Message given?  YES (If response is "NO", the following Medicare IM given date fields will be blank) Date Medicare IM given:  10/29/2013 Medicare IM given by:  Darlyne RussianOLAND,Kenlyn Lose Date Additional Medicare IM given:   Additional Medicare IM given by:    Discharge Disposition:    Per UR Regulation:    If discussed at Long Length of Stay Meetings, dates discussed:    Comments:  10/29/2013 1000 Darlyne Russiannnette Lithzy Bernard RN, [place] [phone] call to patient's sister Park MeoCeleste to verify home health services and resumption with [facility]. She verified the patient's PCP is Dr Larose KellsGarber,he was seen in office last week and she takes as needed.  10/29/2013  0800 Darlyne RussianAnnette Rylin Saez RN, CCM [phone] NCM to continue to follow for discharge needs.  10/28/2013  1200 Darlyne RussianAnnette Jacksyn Beeks RN, [place] [phone] [facility] Lupita LeashDonna called to advise the patient is active for RN, PT

## 2013-10-29 NOTE — Progress Notes (Addendum)
ANTICOAGULATION CONSULT NOTE - Initial Consult  Pharmacy Consult for Lovenox and warfarin Indication:  RUE DVT  Allergies  Allergen Reactions  . Pork-Derived Products Other (See Comments)    unknown    Patient Measurements: Height: 5\' 7"  (170.2 cm) Weight: 110 lb 3.7 oz (50 kg) IBW/kg (Calculated) : 66.1   Vital Signs: Temp: 99.1 F (37.3 C) (09/30 0933) Temp src: Oral (09/30 0933) BP: 161/62 mmHg (09/30 0933) Pulse Rate: 76 (09/30 0933)  Labs:  Recent Labs  10/27/13 0845 10/27/13 0865 10/27/13 1131 10/27/13 2002 10/28/13 0535 10/28/13 0545 10/29/13 0605  HGB 16.2  --   --  13.5 13.6  --   --   HCT 48.9  --   --  40.7 41.5  --   --   PLT 174  --   --  144* 144*  --   --   CREATININE 6.08*  --   --  4.98*  --  4.87* 4.68*  CKTOTAL  --   --  2447*  --  1880*  --  758*  TROPONINI  --  <0.30  --   --   --   --   --     Estimated Creatinine Clearance: 12.3 ml/min (by C-G formula based on Cr of 4.68).   Medical History: Past Medical History  Diagnosis Date  . Hypertension   . CHF (congestive heart failure)   . Seizures   . Tuberculosis 1960's    "got the tx for it" (10/27/2013)  . Type II diabetes mellitus   . Hepatitis C   . Stroke 06/2013    "LLE paralyzed since" (10/27/2013)  . Arthritis     "left knee hurts" (10/27/2013)  . Chronic kidney disease (CKD), stage IV (severe)     Medications:  Prescriptions prior to admission  Medication Sig Dispense Refill  . amLODipine (NORVASC) 10 MG tablet Take 10 mg by mouth daily.      Marland Kitchen aspirin 81 MG tablet Take 81 mg by mouth daily.      Marland Kitchen atorvastatin (LIPITOR) 80 MG tablet Take 80 mg by mouth daily.      . calcitRIOL (ROCALTROL) 0.25 MCG capsule Take 1 capsule (0.25 mcg total) by mouth daily.  30 capsule  0  . calcium acetate (PHOSLO) 667 MG capsule Take 667 mg by mouth 3 (three) times daily with meals.      . carvedilol (COREG) 12.5 MG tablet Take 12.5 mg by mouth 2 (two) times daily with a meal.      .  furosemide (LASIX) 80 MG tablet Take 1 tablet (80 mg total) by mouth daily.  30 tablet  0  . thiamine (VITAMIN B-1) 100 MG tablet Take 100 mg by mouth every morning.      . [DISCONTINUED] insulin aspart (NOVOLOG) 100 UNIT/ML injection Inject 5 Units into the skin 3 (three) times daily before meals.      . [DISCONTINUED] insulin glargine (LANTUS) 100 UNIT/ML injection Inject 25 Units into the skin at bedtime.        Scheduled:  . amLODipine  10 mg Oral Daily  . aspirin EC  81 mg Oral Daily  . calcium acetate  667 mg Oral TID WC  . carvedilol  25 mg Oral BID WC  . docusate sodium  100 mg Oral BID  . feeding supplement (ENSURE)  1 Container Oral Q24H  . feeding supplement (RESOURCE BREEZE)  1 Container Oral Q24H  . insulin aspart  0-9 Units Subcutaneous TID WC  .  insulin glargine  5 Units Subcutaneous QHS  . sodium bicarbonate  1,300 mg Oral BID  . sodium chloride  3 mL Intravenous Q12H  . thiamine  100 mg Oral q morning - 10a    Assessment: 58 y.o male with a history of CHF, ESRD, CVA, HTN and seizures. Today doppler of RUE shows positive for DVT. Now to start Lovenox SQ and coumadin for treatment of RUE DVT. He last received a heparin 5000 units SQ dose today at 13:21. Lovenox dosing for DVT in patient with ESRD is 1mg /kg SQ q24h.  Weight = 50 kg PLTC 144K, H/H 13.6/41.5 No bleeding noted. No baseline INR available.  Warfarin predictor points score= 7 points   Goal of Therapy:  INR 2-3 Heparin level 0.6-1.2 units/ml Monitor platelets by anticoagulation protocol: Yes   Plan:  Lovenox 50 mg SQ q24h STAT baseline INR since no baseline this admission. Then order coumadin dose for tonight. Daily INR CBC q72hr  [name] [name], RPh Clinical Pharmacist Pager: [phone] 10/29/2013,3:22 PM  ADDENDUM:  Baseline INR = 1.22, within normal limits.    MD noted patient is not a candidate for NOACs due to CKD. Albumin = 2.4 , LFTs on 9/28 within normal limits except AST slightly elevated at  54.   Plan:  Give coumadin 5 mg po today F/u INR in AM  [name] [name], RPh Clinical Pharmacist Pager: [phone] 10/29/2013 , 6:29 PM

## 2013-10-29 NOTE — Progress Notes (Signed)
*  Preliminary Results* Right upper extremity venous duplex completed. Right upper extremity is positive for deep and superficial vein thrombosis involving the right axillary, brachial, and cephalic veins.  RN notified.  10/29/2013 2:50 PM  Gertie FeyMichelle Crista Nuon, RVT, RDCS, RDMS

## 2013-10-29 NOTE — Progress Notes (Signed)
UR completed 

## 2013-10-30 DIAGNOSIS — I82401 Acute embolism and thrombosis of unspecified deep veins of right lower extremity: Secondary | ICD-10-CM

## 2013-10-30 DIAGNOSIS — E1122 Type 2 diabetes mellitus with diabetic chronic kidney disease: Secondary | ICD-10-CM

## 2013-10-30 DIAGNOSIS — E1121 Type 2 diabetes mellitus with diabetic nephropathy: Secondary | ICD-10-CM

## 2013-10-30 DIAGNOSIS — E162 Hypoglycemia, unspecified: Secondary | ICD-10-CM

## 2013-10-30 DIAGNOSIS — I82409 Acute embolism and thrombosis of unspecified deep veins of unspecified lower extremity: Secondary | ICD-10-CM

## 2013-10-30 DIAGNOSIS — N184 Chronic kidney disease, stage 4 (severe): Secondary | ICD-10-CM

## 2013-10-30 LAB — RENAL FUNCTION PANEL
Albumin: 2.2 g/dL — ABNORMAL LOW (ref 3.5–5.2)
Anion gap: 11 (ref 5–15)
BUN: 108 mg/dL — ABNORMAL HIGH (ref 6–23)
CALCIUM: 8.1 mg/dL — AB (ref 8.4–10.5)
CO2: 25 mEq/L (ref 19–32)
Chloride: 102 mEq/L (ref 96–112)
Creatinine, Ser: 5 mg/dL — ABNORMAL HIGH (ref 0.50–1.35)
GFR, EST AFRICAN AMERICAN: 14 mL/min — AB (ref >90)
GFR, EST NON AFRICAN AMERICAN: 12 mL/min — AB (ref >90)
Glucose, Bld: 187 mg/dL — ABNORMAL HIGH (ref 70–99)
PHOSPHORUS: 5.2 mg/dL — AB (ref 2.3–4.6)
POTASSIUM: 4.2 meq/L (ref 3.7–5.3)
SODIUM: 138 meq/L (ref 137–147)

## 2013-10-30 LAB — PROTIME-INR
INR: 1.31 (ref 0.00–1.49)
Prothrombin Time: 16.3 seconds — ABNORMAL HIGH (ref 11.6–15.2)

## 2013-10-30 LAB — GLUCOSE, CAPILLARY
GLUCOSE-CAPILLARY: 286 mg/dL — AB (ref 70–99)
GLUCOSE-CAPILLARY: 155 mg/dL — AB (ref 70–99)
Glucose-Capillary: 139 mg/dL — ABNORMAL HIGH (ref 70–99)
Glucose-Capillary: 118 mg/dL — ABNORMAL HIGH (ref 70–99)

## 2013-10-30 MED ORDER — WARFARIN SODIUM 5 MG PO TABS
5.0000 mg | ORAL_TABLET | Freq: Once | ORAL | Status: AC
  Administered 2013-10-30: 5 mg via ORAL
  Filled 2013-10-30 (×2): qty 1

## 2013-10-30 NOTE — Progress Notes (Signed)
PT Cancellation Note  Patient Details Name: [name] MostClarence [name] MRN: [id number] DOB: [birth year]   Cancelled Treatment:    Reason Eval/Treat Not Completed: Fatigue/lethargy limiting ability to participate;Other (comment) (Reports he has been up all night)   Ivar DrapeStout, Elasia Furnish E 10/30/2013, 9:20 AM Samul Dadauth Allaya Abbasi, PT MS Acute Rehab Dept. Number: [phone]

## 2013-10-30 NOTE — Progress Notes (Signed)
10/30/13 Patient and his Sister watched video #108 Lovenox teaching video.

## 2013-10-30 NOTE — Progress Notes (Signed)
[facility] PROGRESS NOTE  Dimitrios Balestrieri ZOX:[id number] DOB: [birth year] DOA: 10/27/2013 PCP: patient can't remember  Summary: 36 AA male with h/o CKD V not yet on dialysis found unresponsive and CBG 29, CPK 2447, creat 6 (baseline 4), potassium 5.9. Hyperkalemia treated medically and nephrology consulted.  Assessment/Plan:    Hypoglycemia: Resolved.  CBGs ok on lower dose lantus and SSI. Likely due to worsening renal function   Right arm swelling and pain: DVT -lovenox to coumadin -will teach patient to give lovenox to bridge with coumadin for 5 days    Hypertension:  -increase coreg   Reported history of CHF (congestive heart failure): No evidence of acute heart failure. Last echocardiogram showed preserved ejection fraction.    H/O: CVA (cerebrovascular accident): PT rec home PT, 3 in 1 commode. Family able to provide 24 supervision    Near End stage renal disease: No indication for dialysis currently. Lasix held. Will need recs from nephrology whether to resume. Creatinine improved.  Has left AVF    Hyperkalemia resolved. Discontinue telemetry.    History of seizures    Metabolic acidosis    Substance abuse: Not a candidate for hepatitis C treatment at this time.  Hepatitis C  Rhabdomyolysis: statin stopped  Code Status:  full Family Communication:  Sister by phone Disposition Plan:  Home with PT RN in AM??  Consultants:  Nephrology  Procedures:     Antibiotics:  Doxy 9/30- d/c  HPI/Subjective: Right arm pain and swelling started yesterday. No other complaints  Objective: Filed Vitals:   10/30/13 0806  BP:   Pulse: 68  Temp:   Resp:     Intake/Output Summary (Last 24 hours) at 10/30/13 1006 Last data filed at 10/30/13 0204  Gross per 24 hour  Intake      0 ml  Output    475 ml  Net   -475 ml   Filed Weights   10/27/13 0833 10/27/13 2049  Weight: 487.163 kg (1074 lb) 50 kg (110 lb 3.7 oz)    Exam:   General:  Alert,  oriented  Cardiovascular: Regular rate rhythm without murmurs gallops rubs.  Respiratory: Clear to auscultation bilaterally without wheezes rhonchi or rales  Abdomen: Soft nontender nondistended  Ext: right arm warm, tender, erythematous, tender. Saline lock in forearm  Basic Metabolic Panel:  Recent Labs Lab 10/27/13 0845 10/27/13 1131 10/27/13 2002 10/28/13 0545 10/29/13 0605 10/30/13 0850  NA 146  --  134* 140 138 138  K 5.9* 5.9* 4.1 3.4* 4.1 4.2  CL 113*  --  101 101 102 102  CO2 15*  --  15* 21 24 25   GLUCOSE 83  --  168* 159* 105* 187*  BUN 125*  --  109* 104* 101* 108*  CREATININE 6.08*  --  4.98* 4.87* 4.68* 5.00*  CALCIUM 9.7  --  7.8* 7.6* 8.1* 8.1*  MG 2.7*  --   --  1.9  --   --   PHOS  --   --   --  3.7 3.9 5.2*   Liver Function Tests:  Recent Labs Lab 10/27/13 0845 10/28/13 0545 10/29/13 0605 10/30/13 0850  AST 54*  --   --   --   ALT 43  --   --   --   ALKPHOS 114  --   --   --   BILITOT 0.3  --   --   --   PROT 9.3*  --   --   --   ALBUMIN  3.3* 2.5* 2.4* 2.2*   No results found for this basename: LIPASE, AMYLASE,  in the last 168 hours No results found for this basename: AMMONIA,  in the last 168 hours CBC:  Recent Labs Lab 10/27/13 0845 10/27/13 2002 10/28/13 0535  WBC 5.1 7.9 6.2  NEUTROABS 3.4  --  3.8  HGB 16.2 13.5 13.6  HCT 48.9 40.7 41.5  MCV 83.0 79.6 80.9  PLT 174 144* 144*   Cardiac Enzymes:  Recent Labs Lab 10/27/13 0852 10/27/13 1131 10/28/13 0535 10/29/13 0605  CKTOTAL  --  2447* 1880* 758*  TROPONINI <0.30  --   --   --    BNP (last 3 results) No results found for this basename: PROBNP,  in the last 8760 hours CBG:  Recent Labs Lab 10/29/13 0932 10/29/13 1144 10/29/13 1655 10/29/13 2128 10/30/13 0803  GLUCAP 212* 210* 228* 178* 139*    No results found for this or any previous visit (from the past 240 hour(s)).   Studies: No results found.  Scheduled Meds: . amLODipine  10 mg Oral Daily  .  aspirin EC  81 mg Oral Daily  . calcium acetate  667 mg Oral TID WC  . carvedilol  25 mg Oral BID WC  . docusate sodium  100 mg Oral BID  . enoxaparin (LOVENOX) injection  50 mg Subcutaneous Q24H  . feeding supplement (ENSURE)  1 Container Oral Q24H  . feeding supplement (RESOURCE BREEZE)  1 Container Oral Q24H  . insulin aspart  0-9 Units Subcutaneous TID WC  . insulin glargine  5 Units Subcutaneous QHS  . sodium bicarbonate  1,300 mg Oral BID  . sodium chloride  3 mL Intravenous Q12H  . thiamine  100 mg Oral q morning - 10a  . warfarin   Does not apply Once  . Warfarin - Pharmacist Dosing Inpatient   Does not apply q1800   Continuous Infusions:   Time spent: 25 minutes  Marlin CanaryVANN, JESSICA  [facility] Pager [phone]. If 7PM-7AM, please contact night-coverage at [email or web address], password [email or web address] 10/30/2013, 10:06 AM  LOS: 3 days

## 2013-10-30 NOTE — Discharge Instructions (Signed)

## 2013-10-30 NOTE — Progress Notes (Signed)
ANTICOAGULATION CONSULT NOTE - Follow Up Consult  Pharmacy Consult for coumadin and lovenox Indication: RUE DVT  Allergies  Allergen Reactions  . Pork-Derived Products Other (See Comments)    unknown    Patient Measurements: Height: 5\' 7"  (170.2 cm) Weight: 110 lb 3.7 oz (50 kg) IBW/kg (Calculated) : 66.1  Vital Signs: Temp: 97.9 F (36.6 C) (10/01 1000) Temp src: Oral (10/01 1000) BP: 148/63 mmHg (10/01 1000) Pulse Rate: 73 (10/01 1000)  Labs:  Recent Labs  10/27/13 2002 10/28/13 0535 10/28/13 0545 10/29/13 0605 10/29/13 1729 10/30/13 0850  HGB 13.5 13.6  --   --   --   --   HCT 40.7 41.5  --   --   --   --   PLT 144* 144*  --   --   --   --   LABPROT  --   --   --   --  15.4* 16.3*  INR  --   --   --   --  1.22 1.31  CREATININE 4.98*  --  4.87* 4.68*  --  5.00*  CKTOTAL  --  1880*  --  758*  --   --     Estimated Creatinine Clearance: 11.5 ml/min (by C-G formula based on Cr of 5).   Assessment: Patient is a 58 y.o M on anticoagulation overlap day #2 of 5 days minimum for new RUE DVT.  INR is 1.3 today with first dose of coumadin given last night.  No bleeding documented.  Goal of Therapy:  INR 2-3 Monitor platelets by anticoagulation protocol: Yes   Plan:  1) coumadin 5mg  PO x1 today 2) continue lovenox 50mg  SQ q24h  Golda Zavalza P 10/30/2013,11:38 AM

## 2013-10-31 DIAGNOSIS — N189 Chronic kidney disease, unspecified: Secondary | ICD-10-CM

## 2013-10-31 LAB — CBC
HCT: 28.2 % — ABNORMAL LOW (ref 39.0–52.0)
HEMOGLOBIN: 9.3 g/dL — AB (ref 13.0–17.0)
MCH: 26.6 pg (ref 26.0–34.0)
MCHC: 33 g/dL (ref 30.0–36.0)
MCV: 80.8 fL (ref 78.0–100.0)
Platelets: 112 10*3/uL — ABNORMAL LOW (ref 150–400)
RBC: 3.49 MIL/uL — AB (ref 4.22–5.81)
RDW: 17.5 % — ABNORMAL HIGH (ref 11.5–15.5)
WBC: 5.5 10*3/uL (ref 4.0–10.5)

## 2013-10-31 LAB — RENAL FUNCTION PANEL
Albumin: 2.1 g/dL — ABNORMAL LOW (ref 3.5–5.2)
Anion gap: 16 — ABNORMAL HIGH (ref 5–15)
BUN: 108 mg/dL — ABNORMAL HIGH (ref 6–23)
CO2: 23 meq/L (ref 19–32)
Calcium: 7.6 mg/dL — ABNORMAL LOW (ref 8.4–10.5)
Chloride: 97 mEq/L (ref 96–112)
Creatinine, Ser: 4.97 mg/dL — ABNORMAL HIGH (ref 0.50–1.35)
GFR calc non Af Amer: 12 mL/min — ABNORMAL LOW (ref >90)
GFR, EST AFRICAN AMERICAN: 14 mL/min — AB (ref >90)
GLUCOSE: 116 mg/dL — AB (ref 70–99)
Phosphorus: 5.6 mg/dL — ABNORMAL HIGH (ref 2.3–4.6)
Potassium: 3.7 mEq/L (ref 3.7–5.3)
SODIUM: 136 meq/L — AB (ref 137–147)

## 2013-10-31 LAB — GLUCOSE, CAPILLARY
Glucose-Capillary: 140 mg/dL — ABNORMAL HIGH (ref 70–99)
Glucose-Capillary: 167 mg/dL — ABNORMAL HIGH (ref 70–99)

## 2013-10-31 LAB — PROTIME-INR
INR: 1.97 — AB (ref 0.00–1.49)
Prothrombin Time: 22.4 seconds — ABNORMAL HIGH (ref 11.6–15.2)

## 2013-10-31 MED ORDER — WARFARIN SODIUM 2 MG PO TABS: 2.0000 mg | ORAL_TABLET | Freq: Once | ORAL | Status: DC

## 2013-10-31 MED ORDER — BOOST / RESOURCE BREEZE PO LIQD: 1.0000 | ORAL | Status: DC

## 2013-10-31 MED ORDER — ENOXAPARIN SODIUM 60 MG/0.6ML ~~LOC~~ SOLN: 50.0000 mg | SUBCUTANEOUS | Status: DC

## 2013-10-31 MED ORDER — INSULIN ASPART 100 UNIT/ML ~~LOC~~ SOLN: 2.0000 [IU] | Freq: Three times a day (TID) | SUBCUTANEOUS | Status: DC

## 2013-10-31 MED ORDER — WARFARIN SODIUM 2 MG PO TABS
2.0000 mg | ORAL_TABLET | Freq: Once | ORAL | Status: DC
  Filled 2013-10-31: qty 1

## 2013-10-31 MED ORDER — INSULIN GLARGINE 100 UNIT/ML ~~LOC~~ SOLN: 5.0000 [IU] | Freq: Every day | SUBCUTANEOUS | Status: DC

## 2013-10-31 MED ORDER — HYDROCODONE-ACETAMINOPHEN 5-325 MG PO TABS
1.0000 | ORAL_TABLET | Freq: Four times a day (QID) | ORAL | Status: DC | PRN
Start: 2013-10-31 — End: 2013-11-06

## 2013-10-31 MED ORDER — ENSURE PUDDING PO PUDG: 1.0000 | ORAL | Status: DC

## 2013-10-31 MED ORDER — CARVEDILOL 25 MG PO TABS: 25.0000 mg | ORAL_TABLET | Freq: Two times a day (BID) | ORAL | Status: DC

## 2013-10-31 NOTE — Plan of Care (Signed)
Problem: Phase I Progression Outcomes Goal: OOB as tolerated unless otherwise ordered Outcome: Not Progressing Patient refuses to get OOB. Refuses attempts from OT and RN to perform daily cares.

## 2013-10-31 NOTE — Progress Notes (Signed)
Occupational Therapy Treatment Patient Details Name: [name] [name] Dirk MRN: [id number] DOB: Oct 13, 1955 Today's Date: 10/31/2013    History of present illness [name] [name] Ternes is a [name] admitted 10/27/13 with altered mental status and fatigue. EMS found pt's CBG to be 29 upon arrival. Following D50 levels raised to the 400's. Pt with PMH of CHF, ESRD, CVA, HTN, seizures.    OT comments  Pt only agreeable to bedlevel and EOB activity.  States he is tired from staff interrupting his sleep.  Instructed in edema management and to monitor fit of ring on R hand.  Cannot recommend home without verification of pt's mobility and family's ability to manage him. Will continue to follow  Follow Up Recommendations  SNF;Supervision/Assistance - 24 hour    Equipment Recommendations       Recommendations for Other Services      Precautions / Restrictions Precautions Precautions: Fall Precaution Comments: R UE DVT Restrictions Weight Bearing Restrictions: No       Mobility Bed Mobility Overal bed mobility: Needs Assistance Bed Mobility: Rolling;Supine to Sit;Sit to Supine Rolling: Mod assist   Supine to sit: Mod assist Sit to supine: Mod assist   General bed mobility comments: HOB up, R UE pain limiting, pt with weakness  Transfers                 General transfer comment: Pt declined OOB activities.     Balance     Sitting balance-Leahy Scale: Poor Sitting balance - Comments: props on L UE in unsupported sitting                           ADL Overall ADL's : Needs assistance/impaired Eating/Feeding: Set up;Bed level   Grooming: Sitting;Min guard Grooming Details (indicate cue type and reason): with L UE                               General ADL Comments: Educated pt in importance of OOB activity in preparation for going home.  Pt states "someone is always there" when asked who was going to be home with him when his sister was at work.       Vision                     Perception     Praxis      Cognition   Behavior During Therapy: Agitated Overall Cognitive Status: No family/caregiver present to determine baseline cognitive functioning Area of Impairment: Safety/judgement;Awareness;Memory     Memory: Decreased short-term memory    Safety/Judgement: Decreased awareness of safety;Decreased awareness of deficits     General Comments: Pt with increased agitation at therapist encouraged OOB, stating he just wants to sleep because people keep interrupting him.    Extremity/Trunk Assessment               Exercises Other Exercises Other Exercises: Instructed pt in edema management techniques and positioned R UE on pillows and blankets.  Pt instructed to monitor fit of ring on R hand and to remove if appropriate.   Shoulder Instructions       General Comments      Pertinent Vitals/ Pain       Pain Assessment: Faces Faces Pain Scale: Hurts little more Pain Location: R UE Pain Descriptors / Indicators: Aching Pain Intervention(s): Repositioned;Monitored during session;Limited activity within patient's tolerance  Home Living  Prior Functioning/Environment              Frequency Min 2X/week     Progress Toward Goals  OT Goals(current goals can now be found in the care plan section)  Progress towards OT goals: Not progressing toward goals - comment (pt declining OOB)  Acute Rehab OT Goals Patient Stated Goal: did not state  Plan Discharge plan remains appropriate    Co-evaluation                 End of Session     Activity Tolerance Patient limited by pain;Patient limited by fatigue   Patient Left in bed;with call bell/phone within reach   Nurse Communication          Time: 9147-8295 OT Time Calculation (min): 19 min  Charges: OT General Charges $OT Visit: 1 Procedure OT Treatments $Self Care/Home  Management : 8-22 mins  Evern Bio 10/31/2013, 10:09 AM [phone]

## 2013-10-31 NOTE — Discharge Summary (Signed)
Physician Discharge Summary  [name] MostClarence Scoggin ZOX:[id number] DOB: 01/10/1956 DOA: 10/27/2013  PCP: Lonia BloodGARBA,LAWAL, MD  Admit date: 10/27/2013 Discharge date: 10/31/2013  Time spent:  35 minutes  Recommendations for Outpatient Follow-up:  Home health PT/OT/RN- to draw Pt/INRs Cbc 1 week PT/INR on Monday- results to PCP 24 hour care by family    Discharge Diagnoses:  Principal Problem:   Hypoglycemia Active Problems:   Hypertension   Chronic diastolic CHF (congestive heart failure)   H/O: CVA (cerebrovascular accident)   End stage renal disease   Hyperkalemia   History of seizures   Metabolic acidosis   Substance abuse   DM (diabetes mellitus), type 2 with renal complications   Rhabdomyolysis   Swelling of arm, right   DVT (deep venous thrombosis)   Discharge Condition: improved  Diet recommendation: diabetic  Filed Weights   10/27/13 0833 10/27/13 2049 10/30/13 2100  Weight: 487.163 kg (1074 lb) 50 kg (110 lb 3.7 oz) 49.7 kg (109 lb 9.1 oz)    History of present illness:  [name] [name] is a 58 y.o. male, with a history of CHF, ESRD, CVA, HTN, seizures who presented with altered mental status and fatigue when his sister saw him this morning at his house. EMS was called and taken to the ED. EMS found his CBG at 29. Patient was started on D50 and now levels have raised to the 400's. Patient stated he is in no discomfort except for mild weakness and is thirsty. Patient had difficulty recalling the details of this morning and yesterday. Patient states that he has been taking his insulin as directed and that he has been eating meals regularly in the past day. He stated that he sister disagrees, and that he has not been compliant. He denied nausea, vomiting, chest pain, abdominal pain and shortness of breath.  HD fistula was created approximately 2 months ago, but as of yet he has not started HD. In the ER he was found to be hyperkalemia with an elevated CK. He appeared dehydrated.  Sodium 146, hemoglobin 16.2, creatinine 6.08 (baseline creatinine 09/12/2013 was 4.14)   Hospital Course:  Hypoglycemia: Resolved. CBGs ok on lower dose lantus and SSI. Likely due to worsening renal function  Right arm swelling and pain: DVT - d/c was day #3 -lovenox to coumadin  -will teach patient to give lovenox to bridge with coumadin for 5 days- INR check on Monday    Hypertension:  -increase coreg   Reported history of CHF (congestive heart failure): No evidence of acute heart failure. Last echocardiogram showed preserved ejection fraction.   H/O: CVA (cerebrovascular accident): PT rec home PT, 3 in 1 commode. Family able to provide 24 supervision   Near End stage renal disease: No indication for dialysis currently. Resume lasix on d/c. Creatinine improved. Has left AVF  Hyperkalemia resolved. Discontinue telemetry.  History of seizures  Metabolic acidosis  Substance abuse: Not a candidate for hepatitis C treatment at this time.  Hepatitis C  Rhabdomyolysis: statin stopped  Procedures:    Consultations:  Renal   Discharge Exam: Filed Vitals:   10/31/13 0933  BP: 136/64  Pulse: 68  Temp: 98 F (36.7 C)  Resp: 18    General: A+Ox3, NAd- wants to go home Cardiovascular: rrr Respiratory: clear  Discharge Instructions You were cared for by a hospitalist during your hospital stay. If you have any questions about your discharge medications or the care you received while you were in the hospital after you are discharged, you can call  the unit and asked to speak with the hospitalist on call if the hospitalist that took care of you is not available. Once you are discharged, your primary care physician will handle any further medical issues. Please note that NO REFILLS for any discharge medications will be authorized once you are discharged, as it is imperative that you return to your primary care physician (or establish a relationship with a primary care physician if you do  not have one) for your aftercare needs so that they can reassess your need for medications and monitor your lab values.  Discharge Instructions   Diet - low sodium heart healthy    Complete by:  As directed      Diet Carb Modified    Complete by:  As directed      Discharge instructions    Complete by:  As directed   Home health PT/OT/RN- to draw Pt/INRs Cbc 1 week PT/INR on Monday- results to PCP 24 hour care by family     Increase activity slowly    Complete by:  As directed           Current Discharge Medication List    START taking these medications   Details  enoxaparin (LOVENOX) 60 MG/0.6ML injection Inject 0.5 mLs (50 mg total) into the skin daily. Qty: 4 Syringe, Refills: 0    feeding supplement, ENSURE, (ENSURE) PUDG Take 1 Container by mouth daily. Qty: 12 Can, Refills: 5    feeding supplement, RESOURCE BREEZE, (RESOURCE BREEZE) LIQD Take 1 Container by mouth daily. Qty: 20 Container, Refills: 5    HYDROcodone-acetaminophen (NORCO/VICODIN) 5-325 MG per tablet Take 1 tablet by mouth every 6 (six) hours as needed for moderate pain. Qty: 5 tablet, Refills: 0    sodium bicarbonate 650 MG tablet Take 2 tablets (1,300 mg total) by mouth 2 (two) times daily. Qty: 120 tablet, Refills: 1    warfarin (COUMADIN) 2 MG tablet Take 1 tablet (2 mg total) by mouth one time only at 6 PM. Qty: 5 tablet, Refills: 0      CONTINUE these medications which have CHANGED   Details  carvedilol (COREG) 25 MG tablet Take 1 tablet (25 mg total) by mouth 2 (two) times daily with a meal. Qty: 60 tablet, Refills: 0    insulin aspart (NOVOLOG) 100 UNIT/ML injection Inject 2 Units into the skin 3 (three) times daily before meals. Qty: 10 mL, Refills: 11    insulin glargine (LANTUS) 100 UNIT/ML injection Inject 0.05 mLs (5 Units total) into the skin at bedtime. Qty: 10 mL, Refills: 11      CONTINUE these medications which have NOT CHANGED   Details  amLODipine (NORVASC) 10 MG tablet  Take 10 mg by mouth daily.    aspirin 81 MG tablet Take 81 mg by mouth daily.    calcitRIOL (ROCALTROL) 0.25 MCG capsule Take 1 capsule (0.25 mcg total) by mouth daily. Qty: 30 capsule, Refills: 0    calcium acetate (PHOSLO) 667 MG capsule Take 667 mg by mouth 3 (three) times daily with meals.    furosemide (LASIX) 80 MG tablet Take 1 tablet (80 mg total) by mouth daily. Qty: 30 tablet, Refills: 0    thiamine (VITAMIN B-1) 100 MG tablet Take 100 mg by mouth every morning.      STOP taking these medications     atorvastatin (LIPITOR) 80 MG tablet        Allergies  Allergen Reactions  . Pork-Derived Conservation officer, nature    The  patient has tolerated lovenox   Follow-up Information   Follow up with [facility]. [facility] RN, PT, OT)    Contact information:   [address] [place] [address] [phone]       Follow up with Lonia Blood, MD In 1 week. (Pt/INR on [facility])    Specialty:  Internal Medicine   Contact information:   [address] [place] [address] [phone]       Follow up with DETERDING,JAMES L, MD In 1 month.   Specialty:  Nephrology   Contact information:   [address] [place] [address] [phone]        The results of significant diagnostics from this hospitalization (including imaging, microbiology, ancillary and laboratory) are listed below for reference.    Significant Diagnostic Studies: Dg Chest 2 View  10/27/2013   CLINICAL DATA:  Hypoglycemia.  EXAM: CHEST  2 VIEW  COMPARISON:  10/06/2013.  FINDINGS: The lungs are clear without focal infiltrate, edema, pneumothorax or pleural effusion. The cardiopericardial silhouette is within normal limits for size. Imaged bony structures of the thorax are intact.  IMPRESSION: Interval resolution of left lung airspace disease. No acute cardiopulmonary findings on today's study.   Electronically Signed   By: Kennith Center M.D.   On: 10/27/2013 09:07   Ct Head Wo  Contrast  10/27/2013   CLINICAL DATA:  Altered level of consciousness.  EXAM: CT HEAD WITHOUT CONTRAST  TECHNIQUE: Contiguous axial images were obtained from the base of the skull through the vertex without intravenous contrast.  COMPARISON:  Head CT scan 10/06/2013.  FINDINGS: Scattered areas of hypoattenuation in the subcortical and periventricular deep white matter are again seen consistent chronic microvascular ischemic change. The brain is mildly atrophic. No evidence of acute abnormality including infarct, hemorrhage, mass lesion, mass effect, midline shift or abnormal extra-axial fluid collection is identified. There is no hydrocephalus or pneumocephalus. The calvarium is intact.  IMPRESSION: No acute finding.  Stable compared to prior exam.   Electronically Signed   By: Drusilla Kanner M.D.   On: 10/27/2013 09:18   Ct Head Wo Contrast  10/06/2013   CLINICAL DATA:  Unresponsive, altered mental status  EXAM: CT HEAD WITHOUT CONTRAST  TECHNIQUE: Contiguous axial images were obtained from the base of the skull through the vertex without intravenous contrast.  COMPARISON:  None.  FINDINGS: No evidence of parenchymal hemorrhage or extra-axial fluid collection. No mass lesion, mass effect, or midline shift.  No CT evidence of acute infarction.  Subcortical white matter and periventricular small vessel ischemic changes.  Cerebral volume is within normal limits.  No ventriculomegaly.  The visualized paranasal sinuses are essentially clear. The mastoid air cells are unopacified.  No evidence of calvarial fracture.  IMPRESSION: No evidence of acute intracranial abnormality.  Small vessel ischemic changes.   Electronically Signed   By: Charline Bills M.D.   On: 10/06/2013 22:40   Dg Chest Port 1 View  10/06/2013   CLINICAL DATA:  Altered mental status.  EXAM: PORTABLE CHEST - 1 VIEW  COMPARISON:  None.  FINDINGS: Lungs are adequately inflated with hazy airspace opacification over the left midlung suggesting  infection and less likely asymmetric edema. No evidence of effusion. Cardiomediastinal silhouette and remainder the exam is unremarkable.  IMPRESSION: Hazy airspace opacification over the central left lung likely due to infection and less likely asymmetric edema.   Electronically Signed   By: Elberta Fortis M.D.   On: 10/06/2013 21:17  Microbiology: No results found for this or any previous visit (from the past 240 hour(s)).   Labs: Basic Metabolic Panel:  Recent Labs Lab 10/27/13 0845  10/27/13 2002 10/28/13 0545 10/29/13 0605 10/30/13 0850 10/31/13 0435  NA 146  --  134* 140 138 138 136*  K 5.9*  < > 4.1 3.4* 4.1 4.2 3.7  CL 113*  --  101 101 102 102 97  CO2 15*  --  15* 21 24 25 23   GLUCOSE 83  --  168* 159* 105* 187* 116*  BUN 125*  --  109* 104* 101* 108* 108*  CREATININE 6.08*  --  4.98* 4.87* 4.68* 5.00* 4.97*  CALCIUM 9.7  --  7.8* 7.6* 8.1* 8.1* 7.6*  MG 2.7*  --   --  1.9  --   --   --   PHOS  --   --   --  3.7 3.9 5.2* 5.6*  < > = values in this interval not displayed. Liver Function Tests:  Recent Labs Lab 10/27/13 0845 10/28/13 0545 10/29/13 0605 10/30/13 0850 10/31/13 0435  AST 54*  --   --   --   --   ALT 43  --   --   --   --   ALKPHOS 114  --   --   --   --   BILITOT 0.3  --   --   --   --   PROT 9.3*  --   --   --   --   ALBUMIN 3.3* 2.5* 2.4* 2.2* 2.1*   No results found for this basename: LIPASE, AMYLASE,  in the last 168 hours No results found for this basename: AMMONIA,  in the last 168 hours CBC:  Recent Labs Lab 10/27/13 0845 10/27/13 2002 10/28/13 0535 10/31/13 0435  WBC 5.1 7.9 6.2 5.5  NEUTROABS 3.4  --  3.8  --   HGB 16.2 13.5 13.6 9.3*  HCT 48.9 40.7 41.5 28.2*  MCV 83.0 79.6 80.9 80.8  PLT 174 144* 144* 112*   Cardiac Enzymes:  Recent Labs Lab 10/27/13 0852 10/27/13 1131 10/28/13 0535 10/29/13 0605  CKTOTAL  --  2447* 1880* 758*  TROPONINI <0.30  --   --   --    BNP: BNP (last 3 results) No results found for this  basename: PROBNP,  in the last 8760 hours CBG:  Recent Labs Lab 10/30/13 1149 10/30/13 1652 10/30/13 2144 10/31/13 0742 10/31/13 1155  GLUCAP 286* 155* 118* 140* 167*       Signed:  VANN, JESSICA  [facility] 10/31/2013, 12:16 PM

## 2013-10-31 NOTE — Plan of Care (Signed)
Problem: Acute Rehab OT Goals (only OT should resolve) Goal: Pt/Caregiver Will Perform Home Exercise Program Outcome: Not Met (add Reason) Pt with R UE DVT, discontinue goal for R UE.

## 2013-10-31 NOTE — Progress Notes (Addendum)
ANTICOAGULATION CONSULT NOTE - Follow Up Consult  Pharmacy Consult for Lovenox + Warfarin Indication: New RUE DVT  Allergies  Allergen Reactions  . Pork-Derived Products Other (See Comments)    unknown    Patient Measurements: Height: 5\' 7"  (170.2 cm) Weight: 109 lb 9.1 oz (49.7 kg) IBW/kg (Calculated) : 66.1  Vital Signs: Temp: 98.5 F (36.9 C) (10/02 0500) Temp Source: Oral (10/02 0500) BP: 143/59 mmHg (10/02 0500) Pulse Rate: 67 (10/02 0500)  Labs:  Recent Labs  10/29/13 0605 10/29/13 1729 10/30/13 0850 10/31/13 0435  HGB  --   --   --  9.3*  HCT  --   --   --  28.2*  PLT  --   --   --  112*  LABPROT  --  15.4* 16.3* 22.4*  INR  --  1.22 1.31 1.97*  CREATININE 4.68*  --  5.00* 4.97*  CKTOTAL 758*  --   --   --     Estimated Creatinine Clearance: 11.5 ml/min (by C-G formula based on Cr of 4.97).   Assessment: [age] YOM who continues on lovenox + warfarin for new RUE DVT diagnosed this admission. Today is VTE overlap D#3/5 as recommended per CHEST guidelines however best practice is to continue until INR>/=2 for 24 hours.   INR today is slightly SUBtherapeutic though trending upwards (INR 1.97 << 1.31, goal of 2-3). Hgb/Hct drop from baseline however pt noted to be hemoconcentrated on admit - no overt bleeding noted - will monitor closely. Plts 112. The patient was educated on warfarin this admission  Lovenox should be used cautiously in patients with reduced renal function due to a risk of accumulation. If the patient remains on lovenox for >5 days, will monitor levels to ensure appropriate. Dose okay for now.  It is also noted that the patient has an allergy to pork which he clarified as "hives". Lovenox is a pork-derived product however the patient has received 2 doses this admission with no issues. Will continue to monitor closely.   Goal of Therapy:  INR 2-3 Anti-Xa level 0.6-1 units/ml 4hrs after LMWH dose given Monitor platelets by anticoagulation protocol:  Yes   Plan:  1. Continue Lovenox 50 mg SQ every 24 hours 2. Warfarin 2 mg x 1 dose at 1800 today 3. Monitor for signs/symtoms of allergic response to lovenox 4. Will continue to monitor for any signs/symptoms of bleeding and will follow up with PT/INR in the a.m.   Georgina PillionElizabeth Torryn Hudspeth, PharmD, BCPS Clinical Pharmacist Pager: [phone] 10/31/2013 7:55 AM

## 2013-10-31 NOTE — Progress Notes (Signed)
Patient Discharge:  Disposition: Patient discharged home with sister  Education: Patient and sister educated on diagnosis, new medications and administration, follow up appointments, all discharge instructions. Pt and sister verbalized understanding.   IV: N/A  Telemetry: N/A  Follow-up appointments: Sister aware of follow up appointments  Prescriptions: Scripts given to the patient's sister.  Transportation: Transported home via car by sister.   Belongings:All belongings taken with the patient

## 2013-11-01 ENCOUNTER — Encounter (HOSPITAL_COMMUNITY): Payer: Self-pay | Admitting: Emergency Medicine

## 2013-11-01 ENCOUNTER — Emergency Department (HOSPITAL_COMMUNITY)
Admission: EM | Admit: 2013-11-01 | Discharge: 2013-11-01 | Disposition: A | Discharge status: Home / Self Care | Payer: Medicare Other | Source: Home / Self Care | Attending: Emergency Medicine | Admitting: Emergency Medicine

## 2013-11-01 DIAGNOSIS — Z8611 Personal history of tuberculosis: Secondary | ICD-10-CM | POA: Insufficient documentation

## 2013-11-01 DIAGNOSIS — Z720 Tobacco use: Secondary | ICD-10-CM | POA: Insufficient documentation

## 2013-11-01 DIAGNOSIS — E118 Type 2 diabetes mellitus with unspecified complications: Secondary | ICD-10-CM

## 2013-11-01 DIAGNOSIS — E162 Hypoglycemia, unspecified: Secondary | ICD-10-CM | POA: Diagnosis not present

## 2013-11-01 DIAGNOSIS — N184 Chronic kidney disease, stage 4 (severe): Secondary | ICD-10-CM | POA: Insufficient documentation

## 2013-11-01 DIAGNOSIS — E11649 Type 2 diabetes mellitus with hypoglycemia without coma: Secondary | ICD-10-CM

## 2013-11-01 DIAGNOSIS — Z7982 Long term (current) use of aspirin: Secondary | ICD-10-CM

## 2013-11-01 DIAGNOSIS — Z8673 Personal history of transient ischemic attack (TIA), and cerebral infarction without residual deficits: Secondary | ICD-10-CM | POA: Insufficient documentation

## 2013-11-01 DIAGNOSIS — I509 Heart failure, unspecified: Secondary | ICD-10-CM

## 2013-11-01 DIAGNOSIS — Z79899 Other long term (current) drug therapy: Secondary | ICD-10-CM

## 2013-11-01 DIAGNOSIS — N189 Chronic kidney disease, unspecified: Secondary | ICD-10-CM

## 2013-11-01 DIAGNOSIS — Z794 Long term (current) use of insulin: Secondary | ICD-10-CM | POA: Insufficient documentation

## 2013-11-01 DIAGNOSIS — I129 Hypertensive chronic kidney disease with stage 1 through stage 4 chronic kidney disease, or unspecified chronic kidney disease: Secondary | ICD-10-CM | POA: Insufficient documentation

## 2013-11-01 DIAGNOSIS — M1990 Unspecified osteoarthritis, unspecified site: Secondary | ICD-10-CM

## 2013-11-01 LAB — CBC WITH DIFFERENTIAL/PLATELET
Basophils Absolute: 0 10*3/uL (ref 0.0–0.1)
Basophils Relative: 0 % (ref 0–1)
EOS ABS: 0.3 10*3/uL (ref 0.0–0.7)
Eosinophils Relative: 5 % (ref 0–5)
HCT: 30.2 % — ABNORMAL LOW (ref 39.0–52.0)
HEMOGLOBIN: 10.1 g/dL — AB (ref 13.0–17.0)
LYMPHS ABS: 1.2 10*3/uL (ref 0.7–4.0)
LYMPHS PCT: 21 % (ref 12–46)
MCH: 26.6 pg (ref 26.0–34.0)
MCHC: 33.4 g/dL (ref 30.0–36.0)
MCV: 79.5 fL (ref 78.0–100.0)
MONOS PCT: 9 % (ref 3–12)
Monocytes Absolute: 0.5 10*3/uL (ref 0.1–1.0)
NEUTROS ABS: 3.6 10*3/uL (ref 1.7–7.7)
NEUTROS PCT: 65 % (ref 43–77)
Platelets: 154 10*3/uL (ref 150–400)
RBC: 3.8 MIL/uL — ABNORMAL LOW (ref 4.22–5.81)
RDW: 16.8 % — ABNORMAL HIGH (ref 11.5–15.5)
WBC: 5.5 10*3/uL (ref 4.0–10.5)

## 2013-11-01 LAB — URINALYSIS, ROUTINE W REFLEX MICROSCOPIC
BILIRUBIN URINE: NEGATIVE
Glucose, UA: NEGATIVE mg/dL
Ketones, ur: NEGATIVE mg/dL
Leukocytes, UA: NEGATIVE
Nitrite: NEGATIVE
Protein, ur: 100 mg/dL — AB
Specific Gravity, Urine: 1.013 (ref 1.005–1.030)
Urobilinogen, UA: 0.2 mg/dL (ref 0.0–1.0)
pH: 5 (ref 5.0–8.0)

## 2013-11-01 LAB — COMPREHENSIVE METABOLIC PANEL
ALK PHOS: 90 U/L (ref 39–117)
ALT: 31 U/L (ref 0–53)
AST: 46 U/L — ABNORMAL HIGH (ref 0–37)
Albumin: 2.5 g/dL — ABNORMAL LOW (ref 3.5–5.2)
Anion gap: 13 (ref 5–15)
BUN: 110 mg/dL — ABNORMAL HIGH (ref 6–23)
CHLORIDE: 97 meq/L (ref 96–112)
CO2: 26 mEq/L (ref 19–32)
Calcium: 8.3 mg/dL — ABNORMAL LOW (ref 8.4–10.5)
Creatinine, Ser: 4.96 mg/dL — ABNORMAL HIGH (ref 0.50–1.35)
GFR, EST AFRICAN AMERICAN: 14 mL/min — AB (ref >90)
GFR, EST NON AFRICAN AMERICAN: 12 mL/min — AB (ref >90)
GLUCOSE: 110 mg/dL — AB (ref 70–99)
POTASSIUM: 3.5 meq/L — AB (ref 3.7–5.3)
Sodium: 136 mEq/L — ABNORMAL LOW (ref 137–147)
Total Bilirubin: 0.6 mg/dL (ref 0.3–1.2)
Total Protein: 7.5 g/dL (ref 6.0–8.3)

## 2013-11-01 LAB — CBG MONITORING, ED
GLUCOSE-CAPILLARY: 219 mg/dL — AB (ref 70–99)
Glucose-Capillary: 134 mg/dL — ABNORMAL HIGH (ref 70–99)

## 2013-11-01 LAB — URINE MICROSCOPIC-ADD ON

## 2013-11-01 MED ORDER — GLUCOSE BLOOD VI STRP: ORAL_STRIP | Status: AC

## 2013-11-01 NOTE — ED Notes (Signed)
Pt here from home found unresponsive by brother. Initial CBG by EMS was 26. CBG at this time 134. Pt was found to have GCS 10 on scene, 51 at this time. Pt was given 25g D50 by EMS. Pt is a/o x 4 at this time. NAD noted. Pt ate 0.5 PB&J.

## 2013-11-01 NOTE — ED Notes (Signed)
[name] DacostaRonaldo (brother) [phone].

## 2013-11-01 NOTE — Discharge Instructions (Signed)
Blood Glucose Monitoring °Monitoring your blood glucose (also know as blood sugar) helps you to manage your diabetes. It also helps you and your health care provider monitor your diabetes and determine how well your treatment plan is working. °WHY SHOULD YOU MONITOR YOUR BLOOD GLUCOSE? °· It can help you understand how food, exercise, and medicine affect your blood glucose. °· It allows you to know what your blood glucose is at any given moment. You can quickly tell if you are having low blood glucose (hypoglycemia) or high blood glucose (hyperglycemia). °· It can help you and your health care provider know how to adjust your medicines. °· It can help you understand how to manage an illness or adjust medicine for exercise. °WHEN SHOULD YOU TEST? °Your health care provider will help you decide how often you should check your blood glucose. This may depend on the type of diabetes you have, your diabetes control, or the types of medicines you are taking. Be sure to write down all of your blood glucose readings so that this information can be reviewed with your health care provider. See below for examples of testing times that your health care provider may suggest. °Type 1 Diabetes °· Test 4 times a day if you are in good control, using an insulin pump, or perform multiple daily injections. °· If your diabetes is not well controlled or if you are sick, you may need to monitor more often. °· It is a good idea to also monitor: °· Before and after exercise. °· Between meals and 2 hours after a meal. °· Occasionally between 2:00 a.m. and 3:00 a.m. °Type 2 Diabetes °· It can vary with each person, but generally, if you are on insulin, test 4 times a day. °· If you take medicines by mouth (orally), test 2 times a day. °· If you are on a controlled diet, test once a day. °· If your diabetes is not well controlled or if you are sick, you may need to monitor more often. °HOW TO MONITOR YOUR BLOOD GLUCOSE °Supplies  Needed °· Blood glucose meter. °· Test strips for your meter. Each meter has its own strips. You must use the strips that go with your own meter. °· A pricking needle (lancet). °· A device that holds the lancet (lancing device). °· A journal or log book to write down your results. °Procedure °· Wash your hands with soap and water. Alcohol is not preferred. °· Prick the side of your finger (not the tip) with the lancet. °· Gently milk the finger until a small drop of blood appears. °· Follow the instructions that come with your meter for inserting the test strip, applying blood to the strip, and using your blood glucose meter. °Other Areas to Get Blood for Testing °Some meters allow you to use other areas of your body (other than your finger) to test your blood. These areas are called alternative sites. The most common alternative sites are: °· The forearm. °· The thigh. °· The back area of the lower leg. °· The palm of the hand. °The blood flow in these areas is slower. Therefore, the blood glucose values you get may be delayed, and the numbers are different from what you would get from your fingers. Do not use alternative sites if you think you are having hypoglycemia. Your reading will not be accurate. Always use a finger if you are having hypoglycemia. Also, if you cannot feel your lows (hypoglycemia unawareness), always use your fingers for your   blood glucose checks. °ADDITIONAL TIPS FOR GLUCOSE MONITORING °· Do not reuse lancets. °· Always carry your supplies with you. °· All blood glucose meters have a 24-hour "hotline" number to call if you have questions or need help. °· Adjust (calibrate) your blood glucose meter with a control solution after finishing a few boxes of strips. °BLOOD GLUCOSE RECORD KEEPING °It is a good idea to keep a daily record or log of your blood glucose readings. Most glucose meters, if not all, keep your glucose records stored in the meter. Some meters come with the ability to download  your records to your home computer. Keeping a record of your blood glucose readings is especially helpful if you are wanting to look for patterns. Make notes to go along with the blood glucose readings because you might forget what happened at that exact time. Keeping good records helps you and your health care provider to work together to achieve good diabetes management.  °Document Released: 01/19/2003 Document Revised: 06/02/2013 Document Reviewed: 06/10/2012 °[employer]® Patient Information ©2015 [employer]. This information is not intended to replace advice given to you by your health care provider. Make sure you discuss any questions you have with your health care provider. ° °Hypoglycemia °Hypoglycemia occurs when the glucose in your blood is too low. Glucose is a type of sugar that is your body's main energy source. Hormones, such as insulin and glucagon, control the level of glucose in the blood. Insulin lowers blood glucose and glucagon increases blood glucose. Having too much insulin in your blood stream, or not eating enough food containing sugar, can result in hypoglycemia. Hypoglycemia can happen to people with or without diabetes. It can develop quickly and can be a medical emergency.  °CAUSES  °· Missing or delaying meals. °· Not eating enough carbohydrates at meals. °· Taking too much diabetes medicine. °· Not timing your oral diabetes medicine or insulin doses with meals, snacks, and exercise. °· Nausea and vomiting. °· Certain medicines. °· Severe illnesses, such as hepatitis, kidney disorders, and certain eating disorders. °· Increased activity or exercise without eating something extra or adjusting medicines. °· Drinking too much alcohol. °· A nerve disorder that affects body functions like your heart rate, blood pressure, and digestion (autonomic neuropathy). °· A condition where the stomach muscles do not function properly (gastroparesis). Therefore, medicines and food may not absorb  properly. °· Rarely, a tumor of the pancreas can produce too much insulin. °SYMPTOMS  °· Hunger. °· Sweating (diaphoresis). °· Change in body temperature. °· Shakiness. °· Headache. °· Anxiety. °· Lightheadedness. °· Irritability. °· Difficulty concentrating. °· Dry mouth. °· Tingling or numbness in the hands or feet. °· Restless sleep or sleep disturbances. °· Altered speech and coordination. °· Change in mental status. °· Seizures or prolonged convulsions. °· Combativeness. °· Drowsiness (lethargic). °· Weakness. °· Increased heart rate or palpitations. °· Confusion. °· Pale, gray skin color. °· Blurred or double vision. °· Fainting. °DIAGNOSIS  °A physical exam and medical history will be performed. Your caregiver may make a diagnosis based on your symptoms. Blood tests and other lab tests may be performed to confirm a diagnosis. Once the diagnosis is made, your caregiver will see if your signs and symptoms go away once your blood glucose is raised.  °TREATMENT  °Usually, you can easily treat your hypoglycemia when you notice symptoms. °· Check your blood glucose. If it is less than 70 mg/dl, take one of the following:   °¨ 3-4 glucose tablets.   °¨ ½ cup   juice.   °¨ ½ cup regular soda.   °¨ 1 cup skim milk.   °¨ ½-1 tube of glucose gel.   °¨ 5-6 hard candies.   °· Avoid high-fat drinks or food that may delay a rise in blood glucose levels. °· Do not take more than the recommended amount of sugary foods, drinks, gel, or tablets. Doing so will cause your blood glucose to go too high.   °· Wait 10-15 minutes and recheck your blood glucose. If it is still less than 70 mg/dl or below your target range, repeat treatment.   °· Eat a snack if it is more than 1 hour until your next meal.   °There may be a time when your blood glucose may go so low that you are unable to treat yourself at home when you start to notice symptoms. You may need someone to help you. You may even faint or be unable to swallow. If you cannot  treat yourself, someone will need to bring you to the hospital.  °HOME CARE INSTRUCTIONS °· If you have diabetes, follow your diabetes management plan by: °¨ Taking your medicines as directed. °¨ Following your exercise plan. °¨ Following your meal plan. Do not skip meals. Eat on time. °¨ Testing your blood glucose regularly. Check your blood glucose before and after exercise. If you exercise longer or different than usual, be sure to check blood glucose more frequently. °¨ Wearing your medical alert jewelry that says you have diabetes. °· Identify the cause of your hypoglycemia. Then, develop ways to prevent the recurrence of hypoglycemia. °· Do not take a hot bath or shower right after an insulin shot. °· Always carry treatment with you. Glucose tablets are the easiest to carry. °· If you are going to drink alcohol, drink it only with meals. °· Tell friends or family members ways to keep you safe during a seizure. This may include removing hard or sharp objects from the area or turning you on your side. °· Maintain a healthy weight. °SEEK MEDICAL CARE IF:  °· You are having problems keeping your blood glucose in your target range. °· You are having frequent episodes of hypoglycemia. °· You feel you might be having side effects from your medicines. °· You are not sure why your blood glucose is dropping so low. °· You notice a change in vision or a new problem with your vision. °SEEK IMMEDIATE MEDICAL CARE IF:  °· Confusion develops. °· A change in mental status occurs. °· The inability to swallow develops. °· Fainting occurs. °Document Released: 01/16/2005 Document Revised: 01/21/2013 Document Reviewed: 05/15/2011 °[employer]® Patient Information ©2015 [employer]. This information is not intended to replace advice given to you by your health care provider. Make sure you discuss any questions you have with your health care provider. ° °

## 2013-11-01 NOTE — ED Provider Notes (Signed)
CSN: [id number]     Arrival date & time 11/01/13  1213 History   None    Chief Complaint  Patient presents with  . Hypoglycemia   Patient is a 59 y.o. male presenting with hypoglycemia.  Hypoglycemia Associated symptoms: no shortness of breath and no vomiting     Patient is a [name] who presents to the ED for hypoglycemia.  Patient was at home and became very somnolent and sleepy and then became very diaphoretic and non-responsive.  Patient's brother found him on the ground and was unable to determine whether he was okay so he called 911.  EMS found the patient to be hypoglycemic with a CBG of 26.  Patient was given 1 amp of D50 which improved his CBG.  Patient was just admitted to the ED for the same and was discharged yesterday.  Patient is supposed to have a home health aid and home health nursing.  Per the family the patient has not had a good appetite, but he did have dinner last night.  Patient was confused after hypoglycemia the patient has not returned to baseline per family on interview.    Past Medical History  Diagnosis Date  . Hypertension   . CHF (congestive heart failure)   . Seizures   . Tuberculosis 1960's    "got the tx for it" (10/27/2013)  . Type II diabetes mellitus   . Hepatitis C   . Stroke 06/2013    "LLE paralyzed since" (10/27/2013)  . Arthritis     "left knee hurts" (10/27/2013)  . Chronic kidney disease (CKD), stage IV (severe)    Past Surgical History  Procedure Laterality Date  . Av fistula placement Left 09/11/2013    Procedure: Insertion of arteriovenous gortex graft;  Surgeon: Chuck Hint, MD;  Location: [facility] OR;  Service: Vascular;  Laterality: Left;  . Laceration repair Left ?2014    "cut my leg falling off a truck"  . Loop recorder implant  05/2012   Family History  Problem Relation Age of Onset  . COPD Mother   . Hypertension Mother   . Diabetes Mother    History  Substance Use Topics  . Smoking status: Current Every Day Smoker  -- 0.25 packs/day for 42 years    Types: Cigarettes  . Smokeless tobacco: Never Used  . Alcohol Use: No    Review of Systems  Constitutional: Negative for fever, chills and fatigue.  Respiratory: Negative for chest tightness and shortness of breath.   Cardiovascular: Negative for chest pain and palpitations.  Gastrointestinal: Negative for nausea, vomiting, diarrhea, constipation and blood in stool.  Genitourinary: Negative for dysuria, urgency, frequency, hematuria and difficulty urinating.  Neurological: Positive for weakness.  Psychiatric/Behavioral: Positive for confusion.  All other systems reviewed and are negative.     Allergies  Pork-derived products  Home Medications   Prior to Admission medications   Medication Sig Start Date End Date Taking? Authorizing Provider  amLODipine (NORVASC) 10 MG tablet Take 10 mg by mouth daily.   Yes Historical Provider, MD  aspirin 81 MG tablet Take 81 mg by mouth daily.   Yes Historical Provider, MD  calcitRIOL (ROCALTROL) 0.25 MCG capsule Take 1 capsule (0.25 mcg total) by mouth daily. 09/12/13  Yes Zannie Cove, MD  calcium acetate (PHOSLO) 667 MG capsule Take 667 mg by mouth 3 (three) times daily with meals.   Yes Historical Provider, MD  enoxaparin (LOVENOX) 60 MG/0.6ML injection Inject 0.5 mLs (50 mg total) into  the skin daily. 10/31/13  Yes Joseph Art, DO  furosemide (LASIX) 80 MG tablet Take 1 tablet (80 mg total) by mouth daily. 09/12/13  Yes Zannie Cove, MD  HYDROcodone-acetaminophen (NORCO/VICODIN) 5-325 MG per tablet Take 1 tablet by mouth every 6 (six) hours as needed for moderate pain. 10/31/13  Yes Joseph Art, DO  insulin aspart (NOVOLOG) 100 UNIT/ML injection Inject 2 Units into the skin 3 (three) times daily before meals. 10/31/13  Yes Jessica U Vann, DO  insulin glargine (LANTUS) 100 UNIT/ML injection Inject 25 Units into the skin at bedtime.   Yes Historical Provider, MD  sodium bicarbonate 650 MG tablet Take 2  tablets (1,300 mg total) by mouth 2 (two) times daily. 10/29/13  Yes Christiane Ha, MD  thiamine (VITAMIN B-1) 100 MG tablet Take 100 mg by mouth every morning.   Yes Historical Provider, MD  warfarin (COUMADIN) 2 MG tablet Take 1 tablet (2 mg total) by mouth one time only at 6 PM. 10/31/13  Yes Joseph Art, DO  glucose blood test strip Use as instructed 11/01/13   Simmie Garin A Forcucci, PA-C   BP 147/74  Pulse 67  Temp(Src) 97.6 F (36.4 C) (Oral)  Resp 15  Ht 5\' 7"  (1.702 m)  Wt 112 lb (50.803 kg)  BMI 17.54 kg/m2  SpO2 99% Physical Exam  Nursing note and vitals reviewed. Constitutional: He is oriented to person, place, and time. He appears well-developed and well-nourished. No distress.  HENT:  Head: Normocephalic and atraumatic.  Mouth/Throat: Oropharynx is clear and moist. No oropharyngeal exudate.  Eyes: Conjunctivae and EOM are normal. Pupils are equal, round, and reactive to light. No scleral icterus.  Neck: Normal range of motion. Neck supple. No JVD present. No tracheal deviation present. No thyromegaly present.  Cardiovascular: Normal rate, regular rhythm, normal heart sounds and intact distal pulses.  Exam reveals no gallop and no friction rub.   No murmur heard. Pulmonary/Chest: Effort normal and breath sounds normal. No respiratory distress. He has no wheezes. He has no rales. He exhibits no tenderness.  Abdominal: Soft. Bowel sounds are normal. He exhibits no distension and no mass. There is no tenderness. There is no rebound and no guarding.  Lymphadenopathy:    He has no cervical adenopathy.  Neurological: He is alert and oriented to person, place, and time. No cranial nerve deficit or sensory deficit. Coordination normal.  Left sided upper and lower extremity weakness which is chronic per family from prior stroke.  Skin: Skin is warm and dry. He is not diaphoretic.  Psychiatric: He has a normal mood and affect. His behavior is normal. Judgment and thought content  normal.    ED Course  Procedures (including critical care time) Labs Review Labs Reviewed  CBC WITH DIFFERENTIAL - Abnormal; Notable for the following:    RBC 3.80 (*)    Hemoglobin 10.1 (*)    HCT 30.2 (*)    RDW 16.8 (*)    All other components within normal limits  COMPREHENSIVE METABOLIC PANEL - Abnormal; Notable for the following:    Sodium 136 (*)    Potassium 3.5 (*)    Glucose, Bld 110 (*)    BUN 110 (*)    Creatinine, Ser 4.96 (*)    Calcium 8.3 (*)    Albumin 2.5 (*)    AST 46 (*)    GFR calc non Af Amer 12 (*)    GFR calc Af Amer 14 (*)    All other  components within normal limits  URINALYSIS, ROUTINE W REFLEX MICROSCOPIC - Abnormal; Notable for the following:    APPearance CLOUDY (*)    Hgb urine dipstick TRACE (*)    Protein, ur 100 (*)    All other components within normal limits  URINE MICROSCOPIC-ADD ON - Abnormal; Notable for the following:    Bacteria, UA FEW (*)    All other components within normal limits  CBG MONITORING, ED - Abnormal; Notable for the following:    Glucose-Capillary 134 (*)    All other components within normal limits  CBG MONITORING, ED - Abnormal; Notable for the following:    Glucose-Capillary 219 (*)    All other components within normal limits    Imaging Review No results found.   EKG Interpretation None      MDM   Final diagnoses:  Hypoglycemia  CKD (chronic kidney disease), unspecified stage  Type 2 diabetes mellitus with complication   Patient is a [name] who presents to the ED with hypoglycemia.  Physical exam reveals non-toxic and chronically ill appearing male.  Patient's CBG was improved to 134 upon arrival to the ED.  Patient ate and drank orange juice here.  CBC, CMP, and UA remain unchanged from prior labs performed yesterday at discharge.  I have consulted case management for involvement in the case to speak to the family above placement and home health nursing.  Will place an order for home health RN  at this time.  Will prescribe a new glucometer.  Patient to hold novolog at this time.  Patient follow-up in 2 days for outpatient appointment with his PCP.  Patient to return for fever, hypoglycemia symptoms or any other concerning symptoms.  Patient has been seen by Dr. Jodi MourningZavitz who agrees with the above plan and workup.        Eben Burowourtney A Forcucci, PA-C 11/01/13 1725

## 2013-11-01 NOTE — ED Notes (Signed)
Spoke with case manger, will come see patient in ED

## 2013-11-02 NOTE — ED Provider Notes (Signed)
Medical screening examination/treatment/procedure(s) were conducted as a shared visit with non-physician practitioner(s) or resident and myself. I personally evaluated the patient during the encounter and agree with the findings.  I have personally reviewed any xrays and/ or EKG's with the provider and I agree with interpretation.  Patient presented after being found unresponsive, sweaty and confused by family member. Blood glucose was 26 for EMS. Patient has improved significantly the baseline since then. No focal deficits. Patient was recently discharged from the hospital. Patient has family who helps take care of him at home. Patient denies change in insulin however has had decreased appetite recently. Patient did eat last night prior to the back. Patient is competent medical history however denies other symptoms at this time. On exam patient is general weakness, mild dry mucous membranes, cranial nerves intact, neck supple, abdomen soft nontender, heart rate regular rate and rhythm. Patient neuro intact currently, tolerated by mouth meal and glucose normal. Discussed holding short acting insulin and just using Lantus until close followup outpatient. Case management consult in to help arrange very close followup outpatient for improved glucose control.  Hypoglycemia, transient altered mental status, chronic renal failure   Enid SkeensJoshua M Brisia Schuermann, MD 11/02/13 [phone]

## 2013-11-03 ENCOUNTER — Emergency Department (HOSPITAL_COMMUNITY): Payer: Medicare Other

## 2013-11-03 ENCOUNTER — Inpatient Hospital Stay (HOSPITAL_COMMUNITY)
Admission: EM | Admit: 2013-11-03 | Discharge: 2013-11-06 | DRG: 638 | Disposition: A | Discharge status: Skilled Nursing Facility | Payer: Medicare Other | Attending: Internal Medicine | Admitting: Internal Medicine

## 2013-11-03 ENCOUNTER — Encounter (HOSPITAL_COMMUNITY): Payer: Self-pay | Admitting: Emergency Medicine

## 2013-11-03 DIAGNOSIS — I120 Hypertensive chronic kidney disease with stage 5 chronic kidney disease or end stage renal disease: Secondary | ICD-10-CM | POA: Diagnosis present

## 2013-11-03 DIAGNOSIS — I69344 Monoplegia of lower limb following cerebral infarction affecting left non-dominant side: Secondary | ICD-10-CM | POA: Diagnosis not present

## 2013-11-03 DIAGNOSIS — B1920 Unspecified viral hepatitis C without hepatic coma: Secondary | ICD-10-CM | POA: Diagnosis present

## 2013-11-03 DIAGNOSIS — R569 Unspecified convulsions: Secondary | ICD-10-CM

## 2013-11-03 DIAGNOSIS — N179 Acute kidney failure, unspecified: Secondary | ICD-10-CM | POA: Diagnosis present

## 2013-11-03 DIAGNOSIS — N186 End stage renal disease: Secondary | ICD-10-CM | POA: Diagnosis present

## 2013-11-03 DIAGNOSIS — I82409 Acute embolism and thrombosis of unspecified deep veins of unspecified lower extremity: Secondary | ICD-10-CM | POA: Diagnosis present

## 2013-11-03 DIAGNOSIS — F17210 Nicotine dependence, cigarettes, uncomplicated: Secondary | ICD-10-CM | POA: Diagnosis present

## 2013-11-03 DIAGNOSIS — E876 Hypokalemia: Secondary | ICD-10-CM | POA: Diagnosis present

## 2013-11-03 DIAGNOSIS — Z8249 Family history of ischemic heart disease and other diseases of the circulatory system: Secondary | ICD-10-CM | POA: Diagnosis not present

## 2013-11-03 DIAGNOSIS — Z7982 Long term (current) use of aspirin: Secondary | ICD-10-CM

## 2013-11-03 DIAGNOSIS — E1165 Type 2 diabetes mellitus with hyperglycemia: Secondary | ICD-10-CM

## 2013-11-03 DIAGNOSIS — E43 Unspecified severe protein-calorie malnutrition: Secondary | ICD-10-CM

## 2013-11-03 DIAGNOSIS — E1121 Type 2 diabetes mellitus with diabetic nephropathy: Secondary | ICD-10-CM

## 2013-11-03 DIAGNOSIS — Z9181 History of falling: Secondary | ICD-10-CM

## 2013-11-03 DIAGNOSIS — R680 Hypothermia, not associated with low environmental temperature: Secondary | ICD-10-CM | POA: Diagnosis present

## 2013-11-03 DIAGNOSIS — G92 Toxic encephalopathy: Secondary | ICD-10-CM | POA: Diagnosis present

## 2013-11-03 DIAGNOSIS — E872 Acidosis, unspecified: Secondary | ICD-10-CM

## 2013-11-03 DIAGNOSIS — E162 Hypoglycemia, unspecified: Secondary | ICD-10-CM

## 2013-11-03 DIAGNOSIS — D649 Anemia, unspecified: Secondary | ICD-10-CM | POA: Diagnosis not present

## 2013-11-03 DIAGNOSIS — Z9889 Other specified postprocedural states: Secondary | ICD-10-CM | POA: Diagnosis not present

## 2013-11-03 DIAGNOSIS — Z8673 Personal history of transient ischemic attack (TIA), and cerebral infarction without residual deficits: Secondary | ICD-10-CM

## 2013-11-03 DIAGNOSIS — F1910 Other psychoactive substance abuse, uncomplicated: Secondary | ICD-10-CM

## 2013-11-03 DIAGNOSIS — Z87898 Personal history of other specified conditions: Secondary | ICD-10-CM

## 2013-11-03 DIAGNOSIS — E11649 Type 2 diabetes mellitus with hypoglycemia without coma: Secondary | ICD-10-CM | POA: Diagnosis present

## 2013-11-03 DIAGNOSIS — Z79899 Other long term (current) drug therapy: Secondary | ICD-10-CM | POA: Diagnosis not present

## 2013-11-03 DIAGNOSIS — F1210 Cannabis abuse, uncomplicated: Secondary | ICD-10-CM | POA: Diagnosis present

## 2013-11-03 DIAGNOSIS — I5032 Chronic diastolic (congestive) heart failure: Secondary | ICD-10-CM | POA: Diagnosis present

## 2013-11-03 DIAGNOSIS — E875 Hyperkalemia: Secondary | ICD-10-CM

## 2013-11-03 DIAGNOSIS — Z91018 Allergy to other foods: Secondary | ICD-10-CM

## 2013-11-03 DIAGNOSIS — I82401 Acute embolism and thrombosis of unspecified deep veins of right lower extremity: Secondary | ICD-10-CM

## 2013-11-03 DIAGNOSIS — M13862 Other specified arthritis, left knee: Secondary | ICD-10-CM | POA: Diagnosis present

## 2013-11-03 DIAGNOSIS — Z794 Long term (current) use of insulin: Secondary | ICD-10-CM | POA: Diagnosis not present

## 2013-11-03 DIAGNOSIS — E1122 Type 2 diabetes mellitus with diabetic chronic kidney disease: Secondary | ICD-10-CM | POA: Diagnosis present

## 2013-11-03 DIAGNOSIS — Z7901 Long term (current) use of anticoagulants: Secondary | ICD-10-CM | POA: Diagnosis not present

## 2013-11-03 DIAGNOSIS — E1129 Type 2 diabetes mellitus with other diabetic kidney complication: Secondary | ICD-10-CM

## 2013-11-03 DIAGNOSIS — I82621 Acute embolism and thrombosis of deep veins of right upper extremity: Secondary | ICD-10-CM | POA: Diagnosis present

## 2013-11-03 DIAGNOSIS — Z8611 Personal history of tuberculosis: Secondary | ICD-10-CM | POA: Diagnosis not present

## 2013-11-03 DIAGNOSIS — R404 Transient alteration of awareness: Secondary | ICD-10-CM

## 2013-11-03 DIAGNOSIS — Z833 Family history of diabetes mellitus: Secondary | ICD-10-CM | POA: Diagnosis not present

## 2013-11-03 DIAGNOSIS — R739 Hyperglycemia, unspecified: Secondary | ICD-10-CM

## 2013-11-03 DIAGNOSIS — I10 Essential (primary) hypertension: Secondary | ICD-10-CM

## 2013-11-03 DIAGNOSIS — M7989 Other specified soft tissue disorders: Secondary | ICD-10-CM

## 2013-11-03 DIAGNOSIS — I639 Cerebral infarction, unspecified: Secondary | ICD-10-CM

## 2013-11-03 HISTORY — DX: Hypoglycemia, unspecified: E16.2

## 2013-11-03 LAB — CBC WITH DIFFERENTIAL/PLATELET
BASOS ABS: 0 10*3/uL (ref 0.0–0.1)
Basophils Relative: 0 % (ref 0–1)
EOS ABS: 0.1 10*3/uL (ref 0.0–0.7)
Eosinophils Relative: 2 % (ref 0–5)
HCT: 29.1 % — ABNORMAL LOW (ref 39.0–52.0)
Hemoglobin: 9.5 g/dL — ABNORMAL LOW (ref 13.0–17.0)
LYMPHS ABS: 1 10*3/uL (ref 0.7–4.0)
Lymphocytes Relative: 21 % (ref 12–46)
MCH: 26.2 pg (ref 26.0–34.0)
MCHC: 32.6 g/dL (ref 30.0–36.0)
MCV: 80.4 fL (ref 78.0–100.0)
Monocytes Absolute: 0.4 10*3/uL (ref 0.1–1.0)
Monocytes Relative: 8 % (ref 3–12)
NEUTROS PCT: 69 % (ref 43–77)
Neutro Abs: 3.3 10*3/uL (ref 1.7–7.7)
PLATELETS: 159 10*3/uL (ref 150–400)
RBC: 3.62 MIL/uL — AB (ref 4.22–5.81)
RDW: 17.1 % — ABNORMAL HIGH (ref 11.5–15.5)
WBC: 4.7 10*3/uL (ref 4.0–10.5)

## 2013-11-03 LAB — COMPREHENSIVE METABOLIC PANEL
ALK PHOS: 90 U/L (ref 39–117)
ALT: 27 U/L (ref 0–53)
AST: 35 U/L (ref 0–37)
Albumin: 2.4 g/dL — ABNORMAL LOW (ref 3.5–5.2)
Anion gap: 11 (ref 5–15)
BUN: 94 mg/dL — ABNORMAL HIGH (ref 6–23)
CO2: 26 meq/L (ref 19–32)
Calcium: 8.2 mg/dL — ABNORMAL LOW (ref 8.4–10.5)
Chloride: 106 mEq/L (ref 96–112)
Creatinine, Ser: 4.28 mg/dL — ABNORMAL HIGH (ref 0.50–1.35)
GFR calc Af Amer: 16 mL/min — ABNORMAL LOW (ref >90)
GFR calc non Af Amer: 14 mL/min — ABNORMAL LOW (ref >90)
Glucose, Bld: 29 mg/dL — CL (ref 70–99)
Potassium: 3.3 mEq/L — ABNORMAL LOW (ref 3.7–5.3)
SODIUM: 143 meq/L (ref 137–147)
TOTAL PROTEIN: 7.2 g/dL (ref 6.0–8.3)
Total Bilirubin: 0.7 mg/dL (ref 0.3–1.2)

## 2013-11-03 LAB — CBC
HCT: 25.4 % — ABNORMAL LOW (ref 39.0–52.0)
HEMOGLOBIN: 8.4 g/dL — AB (ref 13.0–17.0)
MCH: 26.7 pg (ref 26.0–34.0)
MCHC: 33.1 g/dL (ref 30.0–36.0)
MCV: 80.6 fL (ref 78.0–100.0)
Platelets: 149 10*3/uL — ABNORMAL LOW (ref 150–400)
RBC: 3.15 MIL/uL — AB (ref 4.22–5.81)
RDW: 17 % — ABNORMAL HIGH (ref 11.5–15.5)
WBC: 4.7 10*3/uL (ref 4.0–10.5)

## 2013-11-03 LAB — I-STAT CG4 LACTIC ACID, ED: LACTIC ACID, VENOUS: 1.13 mmol/L (ref 0.5–2.2)

## 2013-11-03 LAB — URINALYSIS, ROUTINE W REFLEX MICROSCOPIC
BILIRUBIN URINE: NEGATIVE
GLUCOSE, UA: 100 mg/dL — AB
Ketones, ur: NEGATIVE mg/dL
Leukocytes, UA: NEGATIVE
Nitrite: NEGATIVE
Protein, ur: 100 mg/dL — AB
SPECIFIC GRAVITY, URINE: 1.013 (ref 1.005–1.030)
UROBILINOGEN UA: 1 mg/dL (ref 0.0–1.0)
pH: 5 (ref 5.0–8.0)

## 2013-11-03 LAB — TROPONIN I
Troponin I: <0.3 ng/mL (ref <0.30)
Troponin I: <0.3 ng/mL (ref <0.30)

## 2013-11-03 LAB — ETHANOL: Alcohol, Ethyl (B): <11 mg/dL (ref 0–11)

## 2013-11-03 LAB — GLUCOSE, CAPILLARY
GLUCOSE-CAPILLARY: 358 mg/dL — AB (ref 70–99)
GLUCOSE-CAPILLARY: 264 mg/dL — AB (ref 70–99)
Glucose-Capillary: 218 mg/dL — ABNORMAL HIGH (ref 70–99)
Glucose-Capillary: 190 mg/dL — ABNORMAL HIGH (ref 70–99)
Glucose-Capillary: 199 mg/dL — ABNORMAL HIGH (ref 70–99)

## 2013-11-03 LAB — RAPID URINE DRUG SCREEN, HOSP PERFORMED
AMPHETAMINES: NOT DETECTED
BARBITURATES: NOT DETECTED
BENZODIAZEPINES: NOT DETECTED
COCAINE: NOT DETECTED
OPIATES: NOT DETECTED
Tetrahydrocannabinol: POSITIVE — AB

## 2013-11-03 LAB — TSH: TSH: 1.38 u[IU]/mL (ref 0.350–4.500)

## 2013-11-03 LAB — URINE MICROSCOPIC-ADD ON

## 2013-11-03 LAB — MAGNESIUM: MAGNESIUM: 2.1 mg/dL (ref 1.5–2.5)

## 2013-11-03 LAB — CBG MONITORING, ED
Glucose-Capillary: 75 mg/dL (ref 70–99)
Glucose-Capillary: 11 mg/dL — CL (ref 70–99)
Glucose-Capillary: <10 mg/dL — CL (ref 70–99)
Glucose-Capillary: 316 mg/dL — ABNORMAL HIGH (ref 70–99)
Glucose-Capillary: 298 mg/dL — ABNORMAL HIGH (ref 70–99)

## 2013-11-03 LAB — CREATININE, SERUM
Creatinine, Ser: 4.24 mg/dL — ABNORMAL HIGH (ref 0.50–1.35)
GFR calc Af Amer: 16 mL/min — ABNORMAL LOW (ref >90)
GFR, EST NON AFRICAN AMERICAN: 14 mL/min — AB (ref >90)

## 2013-11-03 LAB — MRSA PCR SCREENING: MRSA BY PCR: NEGATIVE

## 2013-11-03 LAB — PROTIME-INR
INR: 3.21 — AB (ref 0.00–1.49)
Prothrombin Time: 32.8 seconds — ABNORMAL HIGH (ref 11.6–15.2)

## 2013-11-03 LAB — CK: Total CK: 210 U/L (ref 7–232)

## 2013-11-03 MED ORDER — SODIUM CHLORIDE 0.9 % IV SOLN
INTRAVENOUS | Status: AC
  Administered 2013-11-03: 14:00:00 via INTRAVENOUS

## 2013-11-03 MED ORDER — CALCITRIOL 0.25 MCG PO CAPS
0.2500 ug | ORAL_CAPSULE | Freq: Every day | ORAL | Status: DC
  Administered 2013-11-03 – 2013-11-06 (×3): 0.25 ug via ORAL
  Filled 2013-11-03 (×5): qty 1

## 2013-11-03 MED ORDER — SODIUM CHLORIDE 0.9 % IV SOLN
INTRAVENOUS | Status: DC
  Administered 2013-11-04 – 2013-11-05 (×5): via INTRAVENOUS

## 2013-11-03 MED ORDER — DEXTROSE-NACL 5-0.9 % IV SOLN: Freq: Once | INTRAVENOUS | Status: DC

## 2013-11-03 MED ORDER — DEXTROSE 50 % IV SOLN
INTRAVENOUS | Status: AC
Start: 2013-11-03 — End: 2013-11-03
  Filled 2013-11-03: qty 50

## 2013-11-03 MED ORDER — AMLODIPINE BESYLATE 10 MG PO TABS
10.0000 mg | ORAL_TABLET | Freq: Every day | ORAL | Status: DC
  Administered 2013-11-03 – 2013-11-06 (×4): 10 mg via ORAL
  Filled 2013-11-03 (×4): qty 1

## 2013-11-03 MED ORDER — ACETAMINOPHEN 650 MG RE SUPP: 650.0000 mg | Freq: Four times a day (QID) | RECTAL | Status: DC | PRN

## 2013-11-03 MED ORDER — ASPIRIN EC 81 MG PO TBEC
81.0000 mg | DELAYED_RELEASE_TABLET | Freq: Every day | ORAL | Status: DC
  Administered 2013-11-03 – 2013-11-06 (×4): 81 mg via ORAL
  Filled 2013-11-03 (×4): qty 1

## 2013-11-03 MED ORDER — VITAMIN B-1 100 MG PO TABS
100.0000 mg | ORAL_TABLET | Freq: Every morning | ORAL | Status: DC
  Administered 2013-11-03 – 2013-11-06 (×3): 100 mg via ORAL
  Filled 2013-11-03 (×5): qty 1

## 2013-11-03 MED ORDER — ONDANSETRON HCL 4 MG/2ML IJ SOLN: 4.0000 mg | Freq: Four times a day (QID) | INTRAMUSCULAR | Status: DC | PRN

## 2013-11-03 MED ORDER — FUROSEMIDE 80 MG PO TABS
80.0000 mg | ORAL_TABLET | Freq: Every day | ORAL | Status: DC
  Administered 2013-11-03 – 2013-11-06 (×4): 80 mg via ORAL
  Filled 2013-11-03 (×4): qty 1

## 2013-11-03 MED ORDER — WARFARIN - PHARMACIST DOSING INPATIENT: Freq: Every day | Status: DC

## 2013-11-03 MED ORDER — HEPARIN SODIUM (PORCINE) 5000 UNIT/ML IJ SOLN: 5000.0000 [IU] | Freq: Two times a day (BID) | INTRAMUSCULAR | Status: DC

## 2013-11-03 MED ORDER — DEXTROSE 50 % IV SOLN
50.0000 mL | Freq: Once | INTRAVENOUS | Status: AC
  Administered 2013-11-03: 50 mL via INTRAVENOUS

## 2013-11-03 MED ORDER — INSULIN ASPART 100 UNIT/ML ~~LOC~~ SOLN
3.0000 [IU] | Freq: Three times a day (TID) | SUBCUTANEOUS | Status: DC
Start: 2013-11-03 — End: 2013-11-04

## 2013-11-03 MED ORDER — ACETAMINOPHEN 325 MG PO TABS: 650.0000 mg | ORAL_TABLET | Freq: Four times a day (QID) | ORAL | Status: DC | PRN

## 2013-11-03 MED ORDER — INSULIN ASPART 100 UNIT/ML ~~LOC~~ SOLN
0.0000 [IU] | Freq: Three times a day (TID) | SUBCUTANEOUS | Status: DC
  Administered 2013-11-03: 3 [IU] via SUBCUTANEOUS
  Administered 2013-11-03: 9 [IU] via SUBCUTANEOUS
  Administered 2013-11-04: 2 [IU] via SUBCUTANEOUS
  Administered 2013-11-04 (×2): 3 [IU] via SUBCUTANEOUS

## 2013-11-03 MED ORDER — SODIUM BICARBONATE 650 MG PO TABS
1300.0000 mg | ORAL_TABLET | Freq: Two times a day (BID) | ORAL | Status: DC
  Administered 2013-11-03 – 2013-11-04 (×3): 1300 mg via ORAL
  Filled 2013-11-03 (×4): qty 2

## 2013-11-03 MED ORDER — WARFARIN SODIUM 2 MG PO TABS
2.0000 mg | ORAL_TABLET | Freq: Once | ORAL | Status: DC
Start: 2013-11-03 — End: 2013-11-03

## 2013-11-03 MED ORDER — SODIUM CHLORIDE 0.9 % IJ SOLN
3.0000 mL | Freq: Two times a day (BID) | INTRAMUSCULAR | Status: DC
  Administered 2013-11-03 – 2013-11-06 (×4): 3 mL via INTRAVENOUS

## 2013-11-03 MED ORDER — ONDANSETRON HCL 4 MG PO TABS: 4.0000 mg | ORAL_TABLET | Freq: Four times a day (QID) | ORAL | Status: DC | PRN

## 2013-11-03 MED ORDER — DEXTROSE-NACL 5-0.9 % IV SOLN: INTRAVENOUS | Status: DC

## 2013-11-03 MED ORDER — DEXTROSE 5 % IV SOLN
Freq: Once | INTRAVENOUS | Status: AC
  Administered 2013-11-03: 08:00:00 via INTRAVENOUS

## 2013-11-03 NOTE — ED Notes (Signed)
Checked pt.blood sugar is 11mg  in rightfinger.report to nurse SCOTTIE RN. RECHECKED ON LEFT FINGER READING READ VERY LOW.NUSRES CAME IN GAVE D5&D50

## 2013-11-03 NOTE — ED Notes (Signed)
Phlebotomy at the bedside  

## 2013-11-03 NOTE — ED Notes (Signed)
Meal tray ordered for pt  

## 2013-11-03 NOTE — ED Provider Notes (Signed)
CSN: [id number]     Arrival date & time 11/03/13  1478 History   First MD Initiated Contact with Patient 11/03/13 [phone]     Chief Complaint  Patient presents with  . Hypoglycemia     (Consider location/radiation/quality/duration/timing/severity/associated sxs/prior Treatment) HPI  Pt with hx ESRD not yet on dialysis, DM, CHF, HTN, Hep C, recent dx RUE blood clot on coumadin and lovenox, recent admission for hypoglycemia and altered mental status (9/28-10/2), visit to ED on 10/3 for same, presents today after found unresponsive, decreased respirations by family members.  Pt described as "being in a deep sleep," sounded like he was crying, would not respond to shaking, calling name, any attempts to wake him.  Sister found cbg to be 50, EMS gave narcan and D50.  Family members bedside now note he is not back to normal but is much improved, awake and recognizes them.  Pt notes only that he is thirsty.  Family members note that they have been giving him his medications as directed (decreased insulin dosages since discharge 10/2), last night gave him his lantus and made him eat 1/2 peanut butter and jelly sandwich, at the time his cbg noted to be 327.  States he falls frequently, is occasionally incontinent of stool and urine, and sleeps a lot.  Family deny any recent sick symptoms, any noted bleeding.   Level V caveat for altered level of consciousness.   Past Medical History  Diagnosis Date  . Hypertension   . CHF (congestive heart failure)   . Seizures   . Tuberculosis 1960's    "got the tx for it" (10/27/2013)  . Type II diabetes mellitus   . Hepatitis C   . Stroke 06/2013    "LLE paralyzed since" (10/27/2013)  . Arthritis     "left knee hurts" (10/27/2013)  . Chronic kidney disease (CKD), stage IV (severe)    Past Surgical History  Procedure Laterality Date  . Av fistula placement Left 09/11/2013    Procedure: Insertion of arteriovenous gortex graft;  Surgeon: Chuck Hint, MD;   Location: [facility] OR;  Service: Vascular;  Laterality: Left;  . Laceration repair Left ?2014    "cut my leg falling off a truck"  . Loop recorder implant  05/2012   Family History  Problem Relation Age of Onset  . COPD Mother   . Hypertension Mother   . Diabetes Mother    History  Substance Use Topics  . Smoking status: Current Every Day Smoker -- 0.25 packs/day for 42 years    Types: Cigarettes  . Smokeless tobacco: Never Used  . Alcohol Use: No    Review of Systems  Unable to perform ROS: Mental status change      Allergies  Pork-derived products  Home Medications   Prior to Admission medications   Medication Sig Start Date End Date Taking? Authorizing Provider  amLODipine (NORVASC) 10 MG tablet Take 10 mg by mouth daily.   Yes Historical Provider, MD  aspirin 81 MG tablet Take 81 mg by mouth daily.   Yes Historical Provider, MD  calcitRIOL (ROCALTROL) 0.25 MCG capsule Take 1 capsule (0.25 mcg total) by mouth daily. 09/12/13  Yes Zannie Cove, MD  calcium acetate (PHOSLO) 667 MG capsule Take 667 mg by mouth 3 (three) times daily with meals.   Yes Historical Provider, MD  enoxaparin (LOVENOX) 60 MG/0.6ML injection Inject 0.5 mLs (50 mg total) into the skin daily. 10/31/13  Yes Joseph Art, DO  furosemide (LASIX) 80 MG  tablet Take 1 tablet (80 mg total) by mouth daily. 09/12/13  Yes Zannie CovePreetha Joseph, MD  HYDROcodone-acetaminophen (NORCO/VICODIN) 5-325 MG per tablet Take 1 tablet by mouth every 6 (six) hours as needed for moderate pain. 10/31/13  Yes Joseph ArtJessica U Vann, DO  insulin aspart (NOVOLOG) 100 UNIT/ML injection Inject 2 Units into the skin 3 (three) times daily before meals. 10/31/13  Yes Jessica U Vann, DO  insulin glargine (LANTUS) 100 UNIT/ML injection Inject 25 Units into the skin at bedtime.   Yes Historical Provider, MD  sodium bicarbonate 650 MG tablet Take 2 tablets (1,300 mg total) by mouth 2 (two) times daily. 10/29/13  Yes Christiane Haorinna L Sullivan, MD  thiamine (VITAMIN B-1)  100 MG tablet Take 100 mg by mouth every morning.   Yes Historical Provider, MD  warfarin (COUMADIN) 2 MG tablet Take 1 tablet (2 mg total) by mouth one time only at 6 PM. 10/31/13  Yes Joseph ArtJessica U Vann, DO  glucose blood test strip Use as instructed 11/01/13   Courtney A Forcucci, PA-C   BP 151/67  Pulse 65  Temp(Src) 93.8 F (34.3 C) (Rectal)  Resp 16  SpO2 100% Physical Exam  Nursing note and vitals reviewed. Constitutional: He appears cachectic. He has a sickly appearance. No distress.  Chronically ill appearing.   HENT:  Head: Normocephalic and atraumatic.  Neck: Neck supple.  Cardiovascular: Normal rate and regular rhythm.   LUE AV graft with bruit and thrill  Pulmonary/Chest: Effort normal and breath sounds normal. No respiratory distress. He has no wheezes. He has no rales.  Abdominal: Soft. He exhibits no distension and no mass. There is no tenderness. There is no rebound and no guarding.  Neurological: He is alert. He exhibits normal muscle tone.  Skin: He is not diaphoretic.  Right upper arm with ecchymosis vs erythema, warmth, tenderness over medial upper arm and right axilla    ED Course  Procedures (including critical care time) Labs Review Labs Reviewed  CBC WITH DIFFERENTIAL - Abnormal; Notable for the following:    RBC 3.62 (*)    Hemoglobin 9.5 (*)    HCT 29.1 (*)    RDW 17.1 (*)    All other components within normal limits  COMPREHENSIVE METABOLIC PANEL - Abnormal; Notable for the following:    Potassium 3.3 (*)    Glucose, Bld 29 (*)    BUN 94 (*)    Creatinine, Ser 4.28 (*)    Calcium 8.2 (*)    Albumin 2.4 (*)    GFR calc non Af Amer 14 (*)    GFR calc Af Amer 16 (*)    All other components within normal limits  PROTIME-INR - Abnormal; Notable for the following:    Prothrombin Time 32.8 (*)    INR 3.21 (*)    All other components within normal limits  CBG MONITORING, ED - Abnormal; Notable for the following:    Glucose-Capillary 11 (*)    All  other components within normal limits  CBG MONITORING, ED - Abnormal; Notable for the following:    Glucose-Capillary <10 (*)    All other components within normal limits  CBG MONITORING, ED - Abnormal; Notable for the following:    Glucose-Capillary 316 (*)    All other components within normal limits  ETHANOL  URINALYSIS, ROUTINE W REFLEX MICROSCOPIC  URINE RAPID DRUG SCREEN (HOSP PERFORMED)  CBG MONITORING, ED  I-STAT CG4 LACTIC ACID, ED    Imaging Review Dg Chest 2 View  11/03/2013  CLINICAL DATA:  Altered mental status.  Status post fall.  EXAM: CHEST  2 VIEW  COMPARISON:  PA and lateral chest 10/27/2013. Single view of the chest 10/06/2013.  FINDINGS: Lungs are clear. No pneumothorax or pleural effusion. Heart size is normal. Loop recorder is again seen.  IMPRESSION: No acute disease.   Electronically Signed   By: Drusilla Kanner M.D.   On: 11/03/2013 09:22   Dg Lumbar Spine Complete  11/03/2013   CLINICAL DATA:  Low back pain.  Possible fall.  EXAM: LUMBAR SPINE - COMPLETE 4+ VIEW  COMPARISON:  None.  FINDINGS: Vertebral body height and alignment are maintained. There is loss of disc space height and endplate sclerosis at L4-5. Paraspinous structures demonstrate atherosclerotic vascular disease.  IMPRESSION: No acute finding.  L4-5 degenerative disc disease.  Atherosclerosis.   Electronically Signed   By: Drusilla Kanner M.D.   On: 11/03/2013 09:24   Dg Hip Complete Left  11/03/2013   CLINICAL DATA:  Possible Fall.  Low back pain.  EXAM: LEFT HIP - COMPLETE 2+ VIEW  COMPARISON:  None.  FINDINGS: No acute bony or joint abnormality is identified. Lower lumbar degenerative change is noted. Atherosclerotic vascular disease is identified.  IMPRESSION: No acute finding.   Electronically Signed   By: Drusilla Kanner M.D.   On: 11/03/2013 09:23   Ct Head Wo Contrast  11/03/2013   CLINICAL DATA:  History of hypertension.  Stroke.  EXAM: CT HEAD WITHOUT CONTRAST  TECHNIQUE: Contiguous axial  images were obtained from the base of the skull through the vertex without intravenous contrast.  COMPARISON:  10/27/2013.  FINDINGS: No intra-axial or extra-axial pathologic fluid or blood collection. No mass. No hydrocephalus. Punctate lucencies noted in the basal ganglia consistent with old lacunar infarcts. White matter changes noted consistent with chronic ischemia. No acute bony abnormality.  IMPRESSION: 1. Chronic ischemic change noted in the basal ganglia and deep white matter. 2. No acute abnormality.   Electronically Signed   By: Maisie Fus  Register   On: 11/03/2013 08:03   Dg Knee Complete 4 Views Left  11/03/2013   CLINICAL DATA:  Sore all over with lacerations along anterior left knee.  EXAM: LEFT KNEE - COMPLETE 4+ VIEW  COMPARISON:  None.  FINDINGS: Patella appears to be bipartite. Prepatellar soft tissue swelling is minimal. No joint effusion. No significant degenerative changes. There are vascular calcifications.  IMPRESSION: 1. No definite fracture.  No joint effusion. 2. Patella appears bipartite. Prepatellar soft tissue swelling is minimal.   Electronically Signed   By: Leanna Battles M.D.   On: 11/03/2013 09:25     EKG Interpretation   Date/Time:  Monday November 03 2013 06:58:53 EDT Ventricular Rate:  62 PR Interval:  174 QRS Duration: 157 QT Interval:  496 QTC Calculation: 504 R Axis:   87 Text Interpretation:  Sinus rhythm Right bundle branch block Left  posterior fasicular block Baseline wander in lead(s) II III aVF No  significant change since last tracing Confirmed by Denton Lank  MD, Caryn Bee  ([id number]) on 11/03/2013 8:01:24 AM      MDM   Final diagnoses:  Hypoglycemia  Altered level of consciousness    Pt with ESRD not yet on dialysis, diabetes, recent admission for hypoglycemia and AMS (9/28-10/2 with change in insulin on discharge), seen in ED two days ago for same, returns today with decreased LOC.  CBG as low as <10 in ED.  D50 given, D5 started IV, pt eating and  drinking once alert  enough to do so safely.  Admitted to [facility], Dr Susie Cassette to admit.      Trixie Dredge, PA-C 11/03/13 1019

## 2013-11-03 NOTE — Progress Notes (Signed)
ANTICOAGULATION CONSULT NOTE - Initial Consult  Pharmacy Consult for warfarin Indication: DVT  Allergies  Allergen Reactions  . Pork-Derived Conservation officer, natureroducts Hives and Other (See Comments)    The patient has tolerated lovenox    Patient Measurements:     Vital Signs: Temp: 94.2 F (34.6 C) (10/05 0811) Temp Source: Rectal (10/05 0811) BP: 148/82 mmHg (10/05 0815) Pulse Rate: 65 (10/05 0815)  Labs:  Recent Labs  11/01/13 1252 11/03/13 0731  HGB 10.1* 9.5*  HCT 30.2* 29.1*  PLT 154 159  LABPROT  --  32.8*  INR  --  3.21*  CREATININE 4.96* 4.28*    The CrCl is unknown because both a height and weight (above a minimum accepted value) are required for this calculation.   Medical History: Past Medical History  Diagnosis Date  . Hypertension   . CHF (congestive heart failure)   . Seizures   . Tuberculosis 1960's    "got the tx for it" (10/27/2013)  . Type II diabetes mellitus   . Hepatitis C   . Stroke 06/2013    "LLE paralyzed since" (10/27/2013)  . Arthritis     "left knee hurts" (10/27/2013)  . Chronic kidney disease (CKD), stage IV (severe)      Assessment: [age] YOM brought in with AMS and hypoglycemia, also with recent dx of RUE DVT discharged 10/2 on Lovenox 50mg  subq daily and warfarin 2mg  po daily. INR on discharge was 1.97. Today on admission, INR is elevated at 3.21. Patient has completed his 5 day overlap with lovenox/warfarin as recommended by CHEST guidelines. Patient with poor renal function, not yet on HD. SCr 4.28 today. Hgb 9.5, plts 159. No overt bleeding noted.  Goal of Therapy:  INR 2-3 Monitor platelets by anticoagulation protocol: Yes   Plan:  1. D/c subq heparin as INR is SUPRAtherapeutic 2. Hold warfarin tonight 3. No need to start Lovenox as overlap has been completed and INR is SUPRAtherapeutic 4. Daily PT/INR 5. CBC in the morning 6. Follow for s/s bleeding  Averill Winters D. Charrisse Masley, PharmD, BCPS Clinical Pharmacist Pager: [phone] 11/03/2013  10:55 AM

## 2013-11-03 NOTE — ED Provider Notes (Signed)
Medical screening examination/treatment/procedure(s) were conducted as a shared visit with non-physician practitioner(s) and myself.  I personally evaluated the patient during the encounter.   EKG Interpretation   Date/Time:  Monday November 03 2013 06:58:53 EDT Ventricular Rate:  62 PR Interval:  174 QRS Duration: 157 QT Interval:  496 QTC Calculation: 504 R Axis:   87 Text Interpretation:  Sinus rhythm Right bundle branch block Left  posterior fasicular block Baseline wander in lead(s) II III aVF No  significant change since last tracing Confirmed by Loudon Krakow  MD, Caryn BeeKEVIN  ([id number]) on 11/03/2013 8:01:24 AM      Pt w hx dm, worsening crf w impending dialyses, c/o gen weakness, poor appetite, and low blood sugars this morning. Recurrent low bs in ED. Fed. Labs. Ivf.   Suzi RootsKevin E Clent Damore, MD 11/03/13 [phone]

## 2013-11-03 NOTE — ED Notes (Signed)
Pt waking up saying he is thirsty.

## 2013-11-03 NOTE — ED Notes (Signed)
Given apple juice and graham crackers with peanut butter

## 2013-11-03 NOTE — ED Notes (Signed)
Sister leaving at this time.

## 2013-11-03 NOTE — Care Management ED Note (Signed)
      CARE MANAGEMENT ED NOTE 11/01/2013  Patient:  [facility]   Account Number:  [id number]  Date Initiated:  11/01/2013  Documentation initiated by:  [facility]  Subjective/Objective Assessment:   hypoglycemia     Subjective/Objective Assessment Detail:     Action/Plan:   new glucometer; request for sliding scale (parameters) for insulin; HHRN/PT/OT/aide   Action/Plan Detail:   Anticipated DC Date:  11/01/2013     Status Recommendation to Physician:   Result of Recommendation:      DC Planning Services  CM consult   [facility] Choice  Resumption Of Svcs/PTA Provider   Choice offered to / List presented to:       [facility] arranged  HH-1 RN  [place].    Status of service:  Completed, signed off  ED Comments:   ED Comments Detail:  11/01/13 13:35 CM met with pt, family, Celeste ([place] on speaker phone).  Consensus is current glucometer is defective.  Celeste and brother are to purchase one on the way home at [facility] ([employer]-most reasonable meter and most reasonable strip cost); request for sliding scale(parameters) for insulin.  Family very supportive and engaged.  Pt has Dr. Jonelle Sidle as PCP.  MD placed HHRN/PT/OT/Aide.  HHRN to monitor INR with [facility] Monday. Family appreciative; RN aware; [facility] rep Lelan Pons notified for [facility] services.    No other CM needs communicated. Mariane Masters, BSN, CM [phone].

## 2013-11-03 NOTE — H&P (Signed)
[facility] History and Physical  [name] MostClarence [name] WUJ:[id number] DOB: [birth year] DOA: 11/03/2013  Referring physician:  PCP: Lonia BloodGARBA,LAWAL, MD   Chief Complaint: hypoglycemia   HPI:  58 y.o. male, with a history of CHF, ESRD, CVA, HTN, seizures who presented with altered mental status and fatigue when his sister saw him this morning at his house. Pt with hx ESRD not yet on dialysis,  , recent dx RUE blood clot on coumadin and lovenox, recent admission for hypoglycemia and altered mental status (9/28-10/2), visit to ED on 10/3 for same, presents today after found unresponsive, decreased respirations by family members. Pt described as "being in a deep sleep," sounded like he was crying, would not respond to shaking, calling name, any attempts to wake him. Sister found cbg to be 50, EMS gave narcan and D50. In the ER he is not back to normal but is much improved, awake and recognizes family members.   Family members note that they have been giving him his medications as directed (decreased insulin dosages since discharge 10/2), last night gave him his lantus and made him eat 1/2 peanut butter and jelly sandwich, at the time his cbg noted to be 327. States he falls frequently, is occasionally incontinent of stool and urine, and sleeps a lot. Family deny any recent sick symptoms, any noted bleeding.   Patient still somnolent and [name] of the history is obtained from the ED notes, CBG upon arrival was less than 10, repeat CBG was 316. Currently on a bear hugger because of hypothermia     Review of Systems: negative for the following  Unable to obtain a complete review of systems because of altered mental status       Past Medical History  Diagnosis Date  . Hypertension   . CHF (congestive heart failure)   . Seizures   . Tuberculosis 1960's    "got the tx for it" (10/27/2013)  . Type II diabetes mellitus   . Hepatitis C   . Stroke 06/2013    "LLE paralyzed since" (10/27/2013)  .  Arthritis     "left knee hurts" (10/27/2013)  . Chronic kidney disease (CKD), stage IV (severe)      Past Surgical History  Procedure Laterality Date  . Av fistula placement Left 09/11/2013    Procedure: Insertion of arteriovenous gortex graft;  Surgeon: Chuck Hinthristopher S Dickson, MD;  [facility];  Service: Vascular;  Laterality: Left;  . Laceration repair Left ?2014    "cut my leg falling off a truck"  . Loop recorder implant  05/2012      Social History:  reports that he has been smoking Cigarettes.  He has a 10.5 pack-year smoking history. He has never used smokeless tobacco. He reports that he does not drink alcohol or use illicit drugs.    Allergies  Allergen Reactions  . Pork-Derived Conservation officer, natureroducts Hives and Other (See Comments)    The patient has tolerated lovenox    Family History  Problem Relation Age of Onset  . COPD Mother   . Hypertension Mother   . Diabetes Mother      Prior to Admission medications   Medication Sig Start Date End Date Taking? Authorizing Provider  amLODipine (NORVASC) 10 MG tablet Take 10 mg by mouth daily.   Yes Historical Provider, MD  aspirin 81 MG tablet Take 81 mg by mouth daily.   Yes Historical Provider, MD  calcitRIOL (ROCALTROL) 0.25 MCG capsule Take 1 capsule (0.25 mcg total) by mouth daily. 09/12/13  Yes Zannie Cove, MD  calcium acetate (PHOSLO) 667 MG capsule Take 667 mg by mouth 3 (three) times daily with meals.   Yes Historical Provider, MD  enoxaparin (LOVENOX) 60 MG/0.6ML injection Inject 0.5 mLs (50 mg total) into the skin daily. 10/31/13  Yes Joseph Art, DO  furosemide (LASIX) 80 MG tablet Take 1 tablet (80 mg total) by mouth daily. 09/12/13  Yes Zannie Cove, MD  HYDROcodone-acetaminophen (NORCO/VICODIN) 5-325 MG per tablet Take 1 tablet by mouth every 6 (six) hours as needed for moderate pain. 10/31/13  Yes Joseph Art, DO  insulin aspart (NOVOLOG) 100 UNIT/ML injection Inject 2 Units into the skin 3 (three) times daily  before meals. 10/31/13  Yes Jessica U Vann, DO  insulin glargine (LANTUS) 100 UNIT/ML injection Inject 25 Units into the skin at bedtime.   Yes Historical Provider, MD  sodium bicarbonate 650 MG tablet Take 2 tablets (1,300 mg total) by mouth 2 (two) times daily. 10/29/13  Yes Christiane Ha, MD  thiamine (VITAMIN B-1) 100 MG tablet Take 100 mg by mouth every morning.   Yes Historical Provider, MD  warfarin (COUMADIN) 2 MG tablet Take 1 tablet (2 mg total) by mouth one time only at 6 PM. 10/31/13  Yes Joseph Art, DO  glucose blood test strip Use as instructed 11/01/13   Eben Burow, PA-C     Physical Exam: Filed Vitals:   11/03/13 0730 11/03/13 0811 11/03/13 0815 11/03/13 1048  BP: 150/69  148/82 148/63  Pulse: 64  65 77  Temp:  94.2 F (34.6 C)  98.9 F (37.2 C)  TempSrc:  Rectal  Oral  Resp: 23  15 14   SpO2: 100%  100% 99%     Constitutional: Vital signs reviewed. Patient is chronically ill-appearing, has a bear hugger in no acute distress and cooperative with exam. Somnolent Head: Normocephalic and atraumatic  Ear: TM normal bilaterally  Mouth: no erythema or exudates, MMM  Eyes: PERRL, EOMI, conjunctivae normal, No scleral icterus.  Neck: Supple, Trachea midline normal ROM, No JVD, mass, thyromegaly, or carotid bruit present.  Cardiovascular: RRR, S1 normal, S2 normal, no MRG, pulses symmetric and intact bilaterally  Pulmonary/Chest: CTAB, no wheezes, rales, or rhonchi  Abdominal: Soft. Non-tender, non-distended, bowel sounds are normal, no masses, organomegaly, or guarding present.  GU: no CVA tenderness Musculoskeletal: Right upper arm with ecchymosis vs erythema, warmth, tenderness over medial upper arm and right axilla  Ext: no edema and no cyanosis, pulses palpable bilaterally (DP and PT)  Hematology: no cervical, inginal, or axillary adenopathy.  Neurological: A&O x3, Strenght is normal and symmetric bilaterally, cranial nerve II-XII are grossly intact, no  focal motor deficit, sensory intact to light touch bilaterally.  Skin: Warm, dry and intact. No rash, cyanosis, or clubbing.  Psychiatric: Normal mood and affect. speech and behavior is normal. Judgment and thought content normal. Cognition and memory are normal.       Labs on Admission:    Basic Metabolic Panel:  Recent Labs Lab 10/28/13 0545 10/29/13 0605 10/30/13 0850 10/31/13 0435 11/01/13 1252 11/03/13 0731  NA 140 138 138 136* 136* 143  K 3.4* 4.1 4.2 3.7 3.5* 3.3*  CL 101 102 102 97 97 106  CO2 21 24 25 23 26 26   GLUCOSE 159* 105* 187* 116* 110* 29*  BUN 104* 101* 108* 108* 110* 94*  CREATININE 4.87* 4.68* 5.00* 4.97* 4.96* 4.28*  CALCIUM 7.6* 8.1* 8.1* 7.6* 8.3* 8.2*  MG 1.9  --   --   --   --   --  PHOS 3.7 3.9 5.2* 5.6*  --   --    Liver Function Tests:  Recent Labs Lab 10/29/13 0605 10/30/13 0850 10/31/13 0435 11/01/13 1252 11/03/13 0731  AST  --   --   --  46* 35  ALT  --   --   --  31 27  ALKPHOS  --   --   --  90 90  BILITOT  --   --   --  0.6 0.7  PROT  --   --   --  7.5 7.2  ALBUMIN 2.4* 2.2* 2.1* 2.5* 2.4*   No results found for this basename: LIPASE, AMYLASE,  in the last 168 hours No results found for this basename: AMMONIA,  in the last 168 hours CBC:  Recent Labs Lab 10/27/13 2002 10/28/13 0535 10/31/13 0435 11/01/13 1252 11/03/13 0731  WBC 7.9 6.2 5.5 5.5 4.7  NEUTROABS  --  3.8  --  3.6 3.3  HGB 13.5 13.6 9.3* 10.1* 9.5*  HCT 40.7 41.5 28.2* 30.2* 29.1*  MCV 79.6 80.9 80.8 79.5 80.4  PLT 144* 144* 112* 154 159   Cardiac Enzymes:  Recent Labs Lab 10/27/13 1131 10/28/13 0535 10/29/13 0605  CKTOTAL 2447* 1880* 758*    BNP (last 3 results) No results found for this basename: PROBNP,  in the last 8760 hours    CBG:  Recent Labs Lab 11/03/13 0651 11/03/13 0758 11/03/13 0803 11/03/13 0923 11/03/13 1047  GLUCAP 75 11* <10* 316* 298*    Radiological Exams on Admission: Dg Chest 2 View  11/03/2013   CLINICAL  DATA:  Altered mental status.  Status post fall.  EXAM: CHEST  2 VIEW  COMPARISON:  PA and lateral chest 10/27/2013. Single view of the chest 10/06/2013.  FINDINGS: Lungs are clear. No pneumothorax or pleural effusion. Heart size is normal. Loop recorder is again seen.  IMPRESSION: No acute disease.   Electronically Signed   By: Drusilla Kanner M.D.   On: 11/03/2013 09:22   Dg Lumbar Spine Complete  11/03/2013   CLINICAL DATA:  Low back pain.  Possible fall.  EXAM: LUMBAR SPINE - COMPLETE 4+ VIEW  COMPARISON:  None.  FINDINGS: Vertebral body height and alignment are maintained. There is loss of disc space height and endplate sclerosis at L4-5. Paraspinous structures demonstrate atherosclerotic vascular disease.  IMPRESSION: No acute finding.  L4-5 degenerative disc disease.  Atherosclerosis.   Electronically Signed   By: Drusilla Kanner M.D.   On: 11/03/2013 09:24   Dg Hip Complete Left  11/03/2013   CLINICAL DATA:  Possible Fall.  Low back pain.  EXAM: LEFT HIP - COMPLETE 2+ VIEW  COMPARISON:  None.  FINDINGS: No acute bony or joint abnormality is identified. Lower lumbar degenerative change is noted. Atherosclerotic vascular disease is identified.  IMPRESSION: No acute finding.   Electronically Signed   By: Drusilla Kanner M.D.   On: 11/03/2013 09:23   Ct Head Wo Contrast  11/03/2013   CLINICAL DATA:  History of hypertension.  Stroke.  EXAM: CT HEAD WITHOUT CONTRAST  TECHNIQUE: Contiguous axial images were obtained from the base of the skull through the vertex without intravenous contrast.  COMPARISON:  10/27/2013.  FINDINGS: No intra-axial or extra-axial pathologic fluid or blood collection. No mass. No hydrocephalus. Punctate lucencies noted in the basal ganglia consistent with old lacunar infarcts. White matter changes noted consistent with chronic ischemia. No acute bony abnormality.  IMPRESSION: 1. Chronic ischemic change noted in the basal ganglia and deep  white matter. 2. No acute abnormality.    Electronically Signed   By: Maisie Fus  Register   On: 11/03/2013 08:03   Dg Knee Complete 4 Views Left  11/03/2013   CLINICAL DATA:  Sore all over with lacerations along anterior left knee.  EXAM: LEFT KNEE - COMPLETE 4+ VIEW  COMPARISON:  None.  FINDINGS: Patella appears to be bipartite. Prepatellar soft tissue swelling is minimal. No joint effusion. No significant degenerative changes. There are vascular calcifications.  IMPRESSION: 1. No definite fracture.  No joint effusion. 2. Patella appears bipartite. Prepatellar soft tissue swelling is minimal.   Electronically Signed   By: Leanna Battles M.D.   On: 11/03/2013 09:25    EKG: Independently reviewed.    Assessment/Plan Active Problems:   Hypoglycemia   Hypoglycemia No underlying signs of infection  - Likely secondary to poor oral intake, recent admission, dose of Lantus was decreased upon discharge Received D50 , now on D5 at 50 cc an hour. Continue sliding scale insulin and pre-meal insulin Accu-Cheks every 2 hours for 12 hours If stable and patient is an alert and awake and the patient can have Accu-Cheks every 4 hours Discontinue Lantus  Hypokalemia 3.3 on admission-could not repeat because of CK D.  Metabolic acidosis  -Secondary to renal function.  - Holding lasix, continue sodium bicarbonate tablets  Hypothermia Patient will be admitted to step down Check UA Chest x-ray negative  Acute on Chronic stage 5 renal disease  -Worsened by not eating, dehydration, and reportedly vomiting.  (baseline creatinine 09/12/2013 was 4.14)  -Will give gentle IV fluids with bicarb. Suspect very close to requiring dialysis, but seen on an emergent indication at this time.  Has left AVF   Elevated CK last admission Recheck CK    Hypertension  -stable, continue to monitor. Continue Coreg and amlodipine.   CHF (congestive heart failure)  -stable, continue coreg  -hold lasix   Diabetes   Recent admission in this facility for  previous episode of diabetic ketoacidosis/hypoglycemia- dose of Lantus decreased on discharge. Difficult situation, given the risk for hypoglycemia given worsening renal function. For now hold Lantus, start SSI, will need further optimization of insulin regimen on discharge.   Social  -Patient has difficulty caring for himself at home due to weakness from stroke  -Potential confused or decreased cognitive state prior to admission  -Uses walker and sometimes a wheelchair  -Stays with sister  -Dietitian for potential care placement  -PT/OT evaluation.  -May need HHSW at discharge if he goes home rather that ALF or SNF.   Stroke history  -continue aspirin and statin    DVT Prophylaxis Coumadin for previous history of DVT  AM Labs Ordered, also please review Full Orders  Family Communication: Alone in room. Sister was in room earlier.  Code Status: Full      Code Status:   full Family Communication: bedside Disposition Plan: admit   Time spent: 70 mins   [facility] Pager [phone]  If 7PM-7AM, please contact night-coverage [email or web address] Password [email or web address] 11/03/2013, 10:55 AM

## 2013-11-03 NOTE — ED Notes (Signed)
PA-C at bedside 

## 2013-11-03 NOTE — ED Notes (Signed)
Per EMS: pt coming home from with c/o unresponsive and hypoglycemia. Pt's family found pt unresponsive in bed, upon EMS arrived pt was unresponsive with snoring respirations. Pt was given 2 mg of narcan IM, fire CBG 58, pt given another 2 mg IV and D50. EMS CBG 116.

## 2013-11-03 NOTE — ED Notes (Signed)
Pt taken to xray 

## 2013-11-03 NOTE — ED Notes (Signed)
Sister still at bedside. Pt waiting for his meal tray. Alert and oriented x 4. Restricted armbands to bilateral arms, IV access remains in left hand by EMS, d/t extreme bruising of right arm.

## 2013-11-03 NOTE — ED Notes (Signed)
Pt returned from xray. Dr. Denton LankSteinl at the bedside.

## 2013-11-04 DIAGNOSIS — F1910 Other psychoactive substance abuse, uncomplicated: Secondary | ICD-10-CM

## 2013-11-04 DIAGNOSIS — Z8669 Personal history of other diseases of the nervous system and sense organs: Secondary | ICD-10-CM

## 2013-11-04 DIAGNOSIS — M7989 Other specified soft tissue disorders: Secondary | ICD-10-CM

## 2013-11-04 DIAGNOSIS — N186 End stage renal disease: Secondary | ICD-10-CM

## 2013-11-04 DIAGNOSIS — Z8673 Personal history of transient ischemic attack (TIA), and cerebral infarction without residual deficits: Secondary | ICD-10-CM

## 2013-11-04 DIAGNOSIS — I5032 Chronic diastolic (congestive) heart failure: Secondary | ICD-10-CM

## 2013-11-04 DIAGNOSIS — E872 Acidosis: Secondary | ICD-10-CM

## 2013-11-04 DIAGNOSIS — I82401 Acute embolism and thrombosis of unspecified deep veins of right lower extremity: Secondary | ICD-10-CM

## 2013-11-04 DIAGNOSIS — E1121 Type 2 diabetes mellitus with diabetic nephropathy: Secondary | ICD-10-CM

## 2013-11-04 LAB — COMPREHENSIVE METABOLIC PANEL
ALBUMIN: 2 g/dL — AB (ref 3.5–5.2)
ALT: 24 U/L (ref 0–53)
ANION GAP: 12 (ref 5–15)
AST: 36 U/L (ref 0–37)
Alkaline Phosphatase: 99 U/L (ref 39–117)
BUN: 87 mg/dL — AB (ref 6–23)
CO2: 23 mEq/L (ref 19–32)
CREATININE: 3.95 mg/dL — AB (ref 0.50–1.35)
Calcium: 7.6 mg/dL — ABNORMAL LOW (ref 8.4–10.5)
Chloride: 100 mEq/L (ref 96–112)
GFR calc non Af Amer: 16 mL/min — ABNORMAL LOW (ref >90)
GFR, EST AFRICAN AMERICAN: 18 mL/min — AB (ref >90)
Glucose, Bld: 216 mg/dL — ABNORMAL HIGH (ref 70–99)
Potassium: 4.1 mEq/L (ref 3.7–5.3)
Sodium: 135 mEq/L — ABNORMAL LOW (ref 137–147)
TOTAL PROTEIN: 6.4 g/dL (ref 6.0–8.3)
Total Bilirubin: 0.6 mg/dL (ref 0.3–1.2)

## 2013-11-04 LAB — CBC
HCT: 24.7 % — ABNORMAL LOW (ref 39.0–52.0)
HEMOGLOBIN: 8.1 g/dL — AB (ref 13.0–17.0)
MCH: 26.4 pg (ref 26.0–34.0)
MCHC: 32.8 g/dL (ref 30.0–36.0)
MCV: 80.5 fL (ref 78.0–100.0)
Platelets: 165 10*3/uL (ref 150–400)
RBC: 3.07 MIL/uL — ABNORMAL LOW (ref 4.22–5.81)
RDW: 17 % — AB (ref 11.5–15.5)
WBC: 7 10*3/uL (ref 4.0–10.5)

## 2013-11-04 LAB — GLUCOSE, CAPILLARY
Glucose-Capillary: 187 mg/dL — ABNORMAL HIGH (ref 70–99)
Glucose-Capillary: 216 mg/dL — ABNORMAL HIGH (ref 70–99)
Glucose-Capillary: 244 mg/dL — ABNORMAL HIGH (ref 70–99)
Glucose-Capillary: 166 mg/dL — ABNORMAL HIGH (ref 70–99)

## 2013-11-04 LAB — PROTIME-INR
INR: 2.75 — AB (ref 0.00–1.49)
PROTHROMBIN TIME: 29.1 s — AB (ref 11.6–15.2)

## 2013-11-04 MED ORDER — CARVEDILOL 6.25 MG PO TABS
6.2500 mg | ORAL_TABLET | Freq: Two times a day (BID) | ORAL | Status: DC
  Administered 2013-11-04 – 2013-11-05 (×2): 6.25 mg via ORAL
  Filled 2013-11-04 (×4): qty 1

## 2013-11-04 MED ORDER — INSULIN ASPART 100 UNIT/ML ~~LOC~~ SOLN: 6.0000 [IU] | Freq: Three times a day (TID) | SUBCUTANEOUS | Status: DC

## 2013-11-04 MED ORDER — WARFARIN SODIUM 1 MG PO TABS
1.0000 mg | ORAL_TABLET | Freq: Once | ORAL | Status: AC
  Administered 2013-11-04: 1 mg via ORAL
  Filled 2013-11-04: qty 1

## 2013-11-04 MED ORDER — ATORVASTATIN CALCIUM 20 MG PO TABS
20.0000 mg | ORAL_TABLET | Freq: Every day | ORAL | Status: DC
  Administered 2013-11-04 – 2013-11-05 (×2): 20 mg via ORAL
  Filled 2013-11-04 (×4): qty 1

## 2013-11-04 MED ORDER — INSULIN ASPART 100 UNIT/ML ~~LOC~~ SOLN
0.0000 [IU] | SUBCUTANEOUS | Status: DC
  Administered 2013-11-05: 3 [IU] via SUBCUTANEOUS
  Administered 2013-11-05: 5 [IU] via SUBCUTANEOUS
  Administered 2013-11-05: 3 [IU] via SUBCUTANEOUS

## 2013-11-04 NOTE — Clinical Documentation Improvement (Addendum)
Possible Clinical Conditions?  Encephalopathy (describe type if known)                       Anoxic                       Septic                       Alcoholic                        Hypertensive                       Metabolic                       Toxic Hyponatremia / Hypernatremia Other Condition Cannot Clinically Determine   Supporting Information:(As per notes) "decreased LOC" in ED.  "Altered level of consciousness"  Risk Factors:DM & Hypoglycemia   Thank You, Nevin BloodgoodJoan B Thompson, RN, BSN, CCDS,Clinical Documentation Specialist:  [phone]  [phone]=[facility] [place]- Health Information Management  See my note of today.  Lonia BloodJeffrey T. Armanii Urbanik, MD [facility] For Consults/Admissions - Flow Manager - [phone] Office  [phone] Pager [phone]  On-Call/Text Page:      [email or web address]      password [email or web address]

## 2013-11-04 NOTE — Progress Notes (Signed)
ANTICOAGULATION CONSULT NOTE - Follow Up Consult  Pharmacy Consult for warfarin Indication: DVT  Allergies  Allergen Reactions  . Pork-Derived Conservation officer, natureroducts Hives and Other (See Comments)    The patient has tolerated lovenox    Patient Measurements: Height: 5\' 7"  (170.2 cm) Weight: 114 lb (51.71 kg) IBW/kg (Calculated) : 66.1   Vital Signs: Temp: 98.1 F (36.7 C) (10/06 0325) Temp Source: Oral (10/06 0325) BP: 162/62 mmHg (10/06 0810) Pulse Rate: 71 (10/06 0810)  Labs:  Recent Labs  11/03/13 0731 11/03/13 1040 11/03/13 1115 11/03/13 1805 11/03/13 2201 11/04/13 0329  HGB 9.5* 8.4*  --   --   --  8.1*  HCT 29.1* 25.4*  --   --   --  24.7*  PLT 159 149*  --   --   --  165  LABPROT 32.8*  --   --   --   --  29.1*  INR 3.21*  --   --   --   --  2.75*  CREATININE 4.28* 4.24*  --   --   --  3.95*  CKTOTAL  --   --  210  --   --   --   TROPONINI  --  <0.30  --  <0.30 <0.30  --     Estimated Creatinine Clearance: 15.1 ml/min (by C-G formula based on Cr of 3.95).   Medical History: Past Medical History  Diagnosis Date  . Hypertension   . CHF (congestive heart failure)   . Seizures   . Tuberculosis 1960's    "got the tx for it" (10/27/2013)  . Type II diabetes mellitus   . Hepatitis C   . Stroke 06/2013    "LLE paralyzed since" (10/27/2013)  . Arthritis     "left knee hurts" (10/27/2013)  . Chronic kidney disease (CKD), stage IV (severe)   . Hypoglycemia 11/03/2013     Assessment: 58 YOM brought in with AMS and hypoglycemia, also with recent dx of RUE DVT discharged 10/2 on Lovenox 50mg  subq daily and warfarin 2mg  po daily. On admission, INR was elevated at 3.21. Patient completed his 5 day overlap with lovenox/warfarin as recommended by CHEST guidelines. Patient with poor renal function, not yet on HD. SCr down to 3.95 today. Hgb 8.1, plts 165. No overt bleeding noted. INR today is therapeutic at 2.75  Goal of Therapy:  INR 2-3 Monitor platelets by  anticoagulation protocol: Yes   Plan:  1. Warfarin 1 mg x 1 dose tonight  2. Daily PT/INR 3. CBC in the morning 4. Follow for s/s bleeding   Vinnie LevelBenjamin Tameeka Luo, PharmD.  Clinical Pharmacist Pager [phone]

## 2013-11-04 NOTE — Progress Notes (Signed)
[place] TEAM 1 - Stepdown/ICU TEAM Progress Note  Jourden Gilson NID:[id number] DOB: 11-08-1955 DOA: 11/03/2013 PCP: Lonia Blood, MD  Admit HPI / Brief Narrative: 58 y.o. BM  PMHx CHF, ESRD, CVA, HTN, Seizures who presented with altered mental status and fatigue when his sister saw him this morning at his house. Pt with hx ESRD not yet on dialysis, , recent dx RUE blood clot on coumadin and lovenox, recent admission for hypoglycemia and altered mental status (9/28-10/2), visit to ED on 10/3 for same, presents today after found unresponsive, decreased respirations by family members. Pt described as "being in a deep sleep," sounded like he was crying, would not respond to shaking, calling name, any attempts to wake him. Sister found cbg to be 50, EMS gave narcan and D50. In the ER he is not back to normal but is much improved, awake and recognizes family members.  Family members note that they have been giving him his medications as directed (decreased insulin dosages since discharge 10/2), last night gave him his lantus and made him eat 1/2 peanut butter and jelly sandwich, at the time his cbg noted to be 327. States he falls frequently, is occasionally incontinent of stool and urine, and sleeps a lot. Family deny any recent sick symptoms, any noted bleeding.  Patient still somnolent and most of the history is obtained from the ED notes, CBG upon arrival was less than 10, repeat CBG was 316. Currently on a bear hugger because of hypothermia   HPI/Subjective: 10/6  A/O. x3 (does not know when), complains of right upper treatment pain which started on this hospitalization. States negative trauma to that extremity except for when they attempted to draw blood on this admission. States he does not recall ever having seizures however does recall having previous CVA.  Assessment/Plan: Hypoglycemia  -No underlying signs of infection -Patient also poor historian unsure of nutritional intake, medication  use, in addition CKD. could be playing into patient's hypoglycemic episodes.   -Continue NovoLog 6 units QAC -Sensitive SSI -No family members present, unsure patient able to care for himself. Will require further investigation  Diabetes type 2 -Recent admission in this facility for previous episode of diabetic ketoacidosis/hypoglycemia,dose of Lantus decreased on discharge.  -Patient's home dose of Lantus 25 units on hold;  because of his CK D. -Increase NovoLog to 6 units QAC . -Continue moderate SSI   Hypokalemia  -Resolved monitor closely   Metabolic acidosis  -Appears to have resolved will DC sodium bicarbonate tablets  -Obtain ABG, CMP, magnesium, CBC in the a.m.   Hypothermia  -Resolved   Acute on Chronic stage 5 renal disease  -Worsened by not eating, dehydration, and reportedly vomiting.  (baseline creatinine 09/12/2013 was 4.14)  -Resolved with gentle IV fluids with bicarb.  -Consult nephrology in a.m. at a minimum patient will have to be considered for AV fistula placement   Hypertension  -Start Coreg 6.25 BID -Continue amlodipine 10 mg daily -Continue Lasix 80 mg daily  CHF (congestive heart failure)  -See hypertension  Stroke history  -continue aspirin 81 mg  -Start Lipitor 20 mg daily -Obtain lipid panel    RUE DVT  -Diagnosed on 10/29/2013  -Continue Coumadin per pharmacy, current INR= 2.75  Substance abuse -UDS positive for marijuana  Social  -Patient has difficulty caring for himself at home due to weakness from stroke  -Potential confused or decreased cognitive state prior to admission  -Uses walker and sometimes a wheelchair  -Stays with sister  -Involve social  worker for potential care placement  -PT/OT evaluation.  -May need HHSW at discharge if he goes home rather that ALF or SNF.      Code Status: FULL Family Communication: no family present at time of exam Disposition Plan: Control of diabetes; ALF vs  SNF   Consultants: NA    Procedure/Significant Events: 09/12/2013 echocardiogram Systolic function normal. LVEF=  55% -60%.  -Doppler parameters were normal.  9/30 right upper extremity ultrasound; Right upper extremity is positive for deep and superficial vein thrombosis involving the right axillary, brachial, and cephalic veins.       Culture 10/5 MRSA by PCR negative   Antibiotics: NA  DVT prophylaxis: Coumadin   Devices NA   LINES / TUBES:      Continuous Infusions: . sodium chloride 100 mL/hr at 11/04/13 0600    Objective: VITAL SIGNS: Temp: 98.1 F (36.7 C) (10/06 0325) Temp Source: Oral (10/06 0325) BP: 162/62 mmHg (10/06 0810) Pulse Rate: 71 (10/06 0810) SPO2; FIO2:   Intake/Output Summary (Last 24 hours) at 11/04/13 0836 Last data filed at 11/04/13 0600  Gross per 24 hour  Intake   1400 ml  Output    925 ml  Net    475 ml     Exam: General: A./O. x3 (does not know when), complains of RUE pain, No acute respiratory distress Lungs: Clear to auscultation bilaterally without wheezes or crackles Cardiovascular: Regular rate and rhythm without murmur gallop or rub normal S1 and S2 Abdomen: Nontender, nondistended, soft, bowel sounds positive, no rebound, no ascites, no appreciable mass Extremities: No significant cyanosis, clubbing, or edema bilateral lower extremities. Right upper extremity, swelling from shoulder to wrist, positive erythema, positive warm to touch, poor pulse  Data Reviewed: Basic Metabolic Panel:  Recent Labs Lab 10/29/13 0605 10/30/13 0850 10/31/13 0435 11/01/13 1252 11/03/13 0731 11/03/13 1040 11/04/13 0329  NA 138 138 136* 136* 143  --  135*  K 4.1 4.2 3.7 3.5* 3.3*  --  4.1  CL 102 102 97 97 106  --  100  CO2 24 25 23 26 26   --  23  GLUCOSE 105* 187* 116* 110* 29*  --  216*  BUN 101* 108* 108* 110* 94*  --  87*  CREATININE 4.68* 5.00* 4.97* 4.96* 4.28* 4.24* 3.95*  CALCIUM 8.1* 8.1* 7.6* 8.3* 8.2*  --   7.6*  MG  --   --   --   --   --  2.1  --   PHOS 3.9 5.2* 5.6*  --   --   --   --    Liver Function Tests:  Recent Labs Lab 10/30/13 0850 10/31/13 0435 11/01/13 1252 11/03/13 0731 11/04/13 0329  AST  --   --  46* 35 36  ALT  --   --  31 27 24   ALKPHOS  --   --  90 90 99  BILITOT  --   --  0.6 0.7 0.6  PROT  --   --  7.5 7.2 6.4  ALBUMIN 2.2* 2.1* 2.5* 2.4* 2.0*   No results found for this basename: LIPASE, AMYLASE,  in the last 168 hours No results found for this basename: AMMONIA,  in the last 168 hours CBC:  Recent Labs Lab 10/31/13 0435 11/01/13 1252 11/03/13 0731 11/03/13 1040 11/04/13 0329  WBC 5.5 5.5 4.7 4.7 7.0  NEUTROABS  --  3.6 3.3  --   --   HGB 9.3* 10.1* 9.5* 8.4* 8.1*  HCT 28.2* 30.2*  29.1* 25.4* 24.7*  MCV 80.8 79.5 80.4 80.6 80.5  PLT 112* 154 159 149* 165   Cardiac Enzymes:  Recent Labs Lab 10/29/13 0605 11/03/13 1040 11/03/13 1115 11/03/13 1805 11/03/13 2201  CKTOTAL 758*  --  210  --   --   TROPONINI  --  <0.30  --  <0.30 <0.30   BNP (last 3 results) No results found for this basename: PROBNP,  in the last 8760 hours CBG:  Recent Labs Lab 11/03/13 1224 11/03/13 1415 11/03/13 1619 11/03/13 1910 11/03/13 2200  GLUCAP 358* 264* 218* 190* 199*    Recent Results (from the past 240 hour(s))  MRSA PCR SCREENING     Status: None   Collection Time    11/03/13 12:19 PM      Result Value Ref Range Status   MRSA by PCR NEGATIVE  NEGATIVE Final   Comment:            The GeneXpert MRSA Assay (FDA     approved for NASAL specimens     only), is one component of a     comprehensive MRSA colonization     surveillance program. It is not     intended to diagnose MRSA     infection nor to guide or     monitor treatment for     MRSA infections.     Studies:  Recent x-ray studies have been reviewed in detail by the Attending Physician  Scheduled Meds:  Scheduled Meds: . amLODipine  10 mg Oral Daily  . aspirin EC  81 mg Oral Daily   . calcitRIOL  0.25 mcg Oral Daily  . furosemide  80 mg Oral Daily  . insulin aspart  0-9 Units Subcutaneous TID WC  . insulin aspart  3 Units Subcutaneous TID AC  . sodium bicarbonate  1,300 mg Oral BID  . sodium chloride  3 mL Intravenous Q12H  . thiamine  100 mg Oral q morning - 10a  . warfarin  1 mg Oral ONCE-1800  . Warfarin - Pharmacist Dosing Inpatient   Does not apply q1800    Time spent on care of this patient: 40 mins   Drema DallasWOODS, CURTIS, J , MD   [facility] Office  [phone] Pager [phone]  On-Call/Text Page:      [email or web address]      password [email or web address]  If 7PM-7AM, please contact night-coverage [email or web address] Password [email or web address] 11/04/2013, 8:36 AM   LOS: 1 day

## 2013-11-04 NOTE — Clinical Documentation Improvement (Addendum)
Please specify type and acuity of CHF: Possible Clinical Conditions?   Chronic Systolic Congestive Heart Failure Chronic Diastolic Congestive Heart Failure Chronic Systolic & Diastolic Congestive Heart Failure Acute Systolic Congestive Heart Failure Acute Diastolic Congestive Heart Failure Acute Systolic & Diastolic Congestive Heart Failure Acute on Chronic Systolic Congestive Heart Failure Acute on Chronic Diastolic Congestive Heart Failure Acute on Chronic Systolic & Diastolic Congestive Heart Failure Other Condition Cannot Clinically Determine   Supporting Information: (As per notes) "a history of CHF"  Thank You, Nevin BloodgoodJoan B Thompson, RN, BSN, CCDS,Clinical Documentation Specialist:  [phone]  [phone]=[facility]- Health Information Management  Please see my note today - pt does NOT actually have CHF - prior notes were erroneous.    Lonia BloodJeffrey T. Jarryn Altland, MD [facility] For Consults/Admissions - Flow Manager - [phone] Office  [phone] Pager [phone]  On-Call/Text Page:      [email or web address]      password [email or web address]

## 2013-11-04 NOTE — Progress Notes (Addendum)
Inpatient Diabetes Program Recommendations  AACE/ADA: New Consensus Statement on Inpatient Glycemic Control (2013)  Target Ranges:  Prepandial:   less than 140 mg/dL      Peak postprandial:   less than 180 mg/dL (1-2 hours)      Critically ill patients:  140 - 180 mg/dL   Reason for Assessment:  Note that patient admitted with hypoglycemia.  He was recently discharged on decreased amount of Lantus 10/31/13.  Diabetes history: Diabetes, Type 2 with renal complications Outpatient Diabetes medications: As of 10/31/13-  Lantus 5 units daily and Novolog 2 units tid with meals.  Current orders for Inpatient glycemic control:  Novolog sensitive tid with meals.  He seems to be at very high risk for severe drops in CBG's.  May need Rx. For Glucagon at discharge?  Agree with holding Lantus for now.  Will follow.  Thanks, Beryl MeagerJenny Jariana Shumard, RN, BC-ADM Inpatient Diabetes Coordinator Pager [phone]

## 2013-11-05 LAB — PREPARE RBC (CROSSMATCH)

## 2013-11-05 LAB — COMPREHENSIVE METABOLIC PANEL
ALBUMIN: 2 g/dL — AB (ref 3.5–5.2)
ALK PHOS: 81 U/L (ref 39–117)
ALT: 22 U/L (ref 0–53)
ANION GAP: 13 (ref 5–15)
AST: 32 U/L (ref 0–37)
BUN: 81 mg/dL — AB (ref 6–23)
CO2: 22 mEq/L (ref 19–32)
CREATININE: 3.8 mg/dL — AB (ref 0.50–1.35)
Calcium: 7.7 mg/dL — ABNORMAL LOW (ref 8.4–10.5)
Chloride: 102 mEq/L (ref 96–112)
GFR calc Af Amer: 19 mL/min — ABNORMAL LOW (ref >90)
GFR calc non Af Amer: 16 mL/min — ABNORMAL LOW (ref >90)
Glucose, Bld: 178 mg/dL — ABNORMAL HIGH (ref 70–99)
POTASSIUM: 4.1 meq/L (ref 3.7–5.3)
Sodium: 137 mEq/L (ref 137–147)
TOTAL PROTEIN: 6.2 g/dL (ref 6.0–8.3)
Total Bilirubin: 0.5 mg/dL (ref 0.3–1.2)

## 2013-11-05 LAB — CBC WITH DIFFERENTIAL/PLATELET
Basophils Absolute: 0 10*3/uL (ref 0.0–0.1)
Basophils Relative: 0 % (ref 0–1)
Eosinophils Absolute: 0.1 10*3/uL (ref 0.0–0.7)
Eosinophils Relative: 2 % (ref 0–5)
HEMATOCRIT: 20.9 % — AB (ref 39.0–52.0)
HEMOGLOBIN: 6.8 g/dL — AB (ref 13.0–17.0)
LYMPHS ABS: 1.5 10*3/uL (ref 0.7–4.0)
LYMPHS PCT: 27 % (ref 12–46)
MCH: 26.9 pg (ref 26.0–34.0)
MCHC: 32.5 g/dL (ref 30.0–36.0)
MCV: 82.6 fL (ref 78.0–100.0)
MONOS PCT: 7 % (ref 3–12)
Monocytes Absolute: 0.4 10*3/uL (ref 0.1–1.0)
NEUTROS ABS: 3.5 10*3/uL (ref 1.7–7.7)
Neutrophils Relative %: 64 % (ref 43–77)
Platelets: 191 10*3/uL (ref 150–400)
RBC: 2.53 MIL/uL — ABNORMAL LOW (ref 4.22–5.81)
RDW: 17.1 % — ABNORMAL HIGH (ref 11.5–15.5)
WBC: 5.4 10*3/uL (ref 4.0–10.5)

## 2013-11-05 LAB — CBC
HEMATOCRIT: 25 % — AB (ref 39.0–52.0)
Hemoglobin: 8.2 g/dL — ABNORMAL LOW (ref 13.0–17.0)
MCH: 26.3 pg (ref 26.0–34.0)
MCHC: 32.8 g/dL (ref 30.0–36.0)
MCV: 80.1 fL (ref 78.0–100.0)
Platelets: 184 10*3/uL (ref 150–400)
RBC: 3.12 MIL/uL — ABNORMAL LOW (ref 4.22–5.81)
RDW: 16 % — ABNORMAL HIGH (ref 11.5–15.5)
WBC: 5.6 10*3/uL (ref 4.0–10.5)

## 2013-11-05 LAB — GLUCOSE, CAPILLARY
GLUCOSE-CAPILLARY: 201 mg/dL — AB (ref 70–99)
GLUCOSE-CAPILLARY: 240 mg/dL — AB (ref 70–99)
Glucose-Capillary: 11 mg/dL — CL (ref 70–99)
Glucose-Capillary: 117 mg/dL — ABNORMAL HIGH (ref 70–99)
Glucose-Capillary: 166 mg/dL — ABNORMAL HIGH (ref 70–99)
Glucose-Capillary: 175 mg/dL — ABNORMAL HIGH (ref 70–99)
Glucose-Capillary: 216 mg/dL — ABNORMAL HIGH (ref 70–99)

## 2013-11-05 LAB — HEMOGLOBIN A1C
Hgb A1c MFr Bld: 9.3 % — ABNORMAL HIGH (ref <5.7)
Mean Plasma Glucose: 220 mg/dL — ABNORMAL HIGH (ref <117)

## 2013-11-05 LAB — LIPID PANEL
Cholesterol: 88 mg/dL (ref 0–200)
HDL: 27 mg/dL — AB (ref >39)
LDL CALC: 42 mg/dL (ref 0–99)
TRIGLYCERIDES: 95 mg/dL (ref <150)
Total CHOL/HDL Ratio: 3.3 RATIO
VLDL: 19 mg/dL (ref 0–40)

## 2013-11-05 LAB — PROTIME-INR
INR: 1.95 — ABNORMAL HIGH (ref 0.00–1.49)
Prothrombin Time: 22.2 seconds — ABNORMAL HIGH (ref 11.6–15.2)

## 2013-11-05 LAB — CK: Total CK: 172 U/L (ref 7–232)

## 2013-11-05 LAB — MAGNESIUM: Magnesium: 1.8 mg/dL (ref 1.5–2.5)

## 2013-11-05 LAB — ABO/RH: ABO/RH(D): B POS

## 2013-11-05 MED ORDER — SODIUM CHLORIDE 0.9 % IV SOLN: Freq: Once | INTRAVENOUS | Status: DC

## 2013-11-05 MED ORDER — INSULIN ASPART 100 UNIT/ML ~~LOC~~ SOLN
0.0000 [IU] | Freq: Three times a day (TID) | SUBCUTANEOUS | Status: DC
  Administered 2013-11-05: 3 [IU] via SUBCUTANEOUS
  Administered 2013-11-06: 8 [IU] via SUBCUTANEOUS

## 2013-11-05 MED ORDER — SODIUM BICARBONATE 650 MG PO TABS
1300.0000 mg | ORAL_TABLET | Freq: Two times a day (BID) | ORAL | Status: DC
  Administered 2013-11-05 – 2013-11-06 (×3): 1300 mg via ORAL
  Filled 2013-11-05 (×4): qty 2

## 2013-11-05 MED ORDER — CALCIUM ACETATE 667 MG PO CAPS
667.0000 mg | ORAL_CAPSULE | Freq: Three times a day (TID) | ORAL | Status: DC
  Administered 2013-11-05 – 2013-11-06 (×2): 667 mg via ORAL
  Filled 2013-11-05 (×5): qty 1

## 2013-11-05 MED ORDER — CARVEDILOL 12.5 MG PO TABS
12.5000 mg | ORAL_TABLET | Freq: Two times a day (BID) | ORAL | Status: DC
  Administered 2013-11-05 – 2013-11-06 (×2): 12.5 mg via ORAL
  Filled 2013-11-05 (×5): qty 1

## 2013-11-05 NOTE — Progress Notes (Signed)
CRITICAL VALUE ALERT  Critical value received:  Hemoglobin 6.8  Date of notification:  11/05/13  Time of notification:  0600  Critical value read back:Yes.    Nurse who received alert:  Ferd HibbsEvelyn Crockett Rallo, RN  MD notified (1st page):  Kirtland BouchardK. Schorr  Time of first page:  0602  MD notified (2nd page):  Time of second page:  Responding MD:  Merdis DelayK. Schorr  Time MD responded:  [phone]

## 2013-11-05 NOTE — Discharge Instructions (Signed)

## 2013-11-05 NOTE — Progress Notes (Signed)
Unable to obtain ABG due to both arms being restricted per policy

## 2013-11-05 NOTE — Progress Notes (Signed)
Orders to transfer to med-surg. Pt A/ox4, VSS, no complaints at this time. Report called by day RN. Will transfer to [phone].   M.Foster SimpsonPiel, RN

## 2013-11-05 NOTE — Progress Notes (Addendum)
[place] TEAM 1 - Stepdown/ICU TEAM Progress Note  [name] [name] ZOX:[id number] DOB: 1955/12/30 DOA: 11/03/2013 PCP: Lonia Blood, MD  Admit HPI / Brief Narrative: 58 y.o. M Hx ESRD not yet on HD, recent dx RUE DVTon coumadin and lovenox, CVA, HTN, and Seizures who presented with altered mental status and fatigue. Pt with recent admission for hypoglycemia and altered mental status (9/28-10/2), visit to ED on 10/3 for same.  Pt described as "being in a deep sleep," would not respond to shaking, calling name, any attempts to wake him. Sister found cbg to be 50, EMS gave narcan and D50.   In the ER he was not back to normal but was much improved, awake and recognizes family members.  Family members noted they have been giving him his medications as directed (decreased insulin dosages since discharge 10/2).  Family states he falls frequently, is occasionally incontinent of stool and urine, and sleeps a lot. CBG upon arrival was less than 10.   HPI/Subjective: Pt is alert and conversant.  He denies any complaints whatsoever, but appears to be mildly confused when pushed on complex questioning.    Assessment/Plan:  Hypoglycemia  -No signs of infection -Patient unsure of nutritional intake, medication use -CKD could be playing into patient's hypoglycemic episodes -No family members present - unsure patient able to care for himself - PT/OT to see  -will not resume lantus - as this is a reccuring issue will need to monitor for extended period prior to d/c to assure is stable  Diabetes type 2 w/ renal complications  -Recent admission in this facility for previous episode of dhypoglycemia > dose of Lantus decreased on discharge  -Patient's new home dose of Lantus 25 units on hold - CBGs now trending up - follow w/o change today   Hypokalemia  -Resolved monitor closely   Metabolic acidosis  -Appears to have resolved - follow trend  Hypothermia  -Resolved - likely due to extreme hypoglcemia     Toxic metabolic encephalopathy Due primarily to hypoglycemia, with some component of uremia likely contributing earlier - slowly clearing  Acute on Chronic stage 5 renal disease  -Worsened by not eating, dehydration, and reportedly vomiting.  -baseline creatinine 09/12/2013 was 4.14) -Resolved with gentle IV fluids with bicarb -Has left AVF in place w/ good thrill palpable  -no acute indication for HD   Hypertension  -poorly controlled - adjust tx plan and follow   ?Congestive heart failure  -no evidence of systolic or diastolic CHF per TTE Aug 2015  Stroke history  -continue aspirin 81 mg  -LDL 42 - reported hx of rhabdom believed to have been related to lipitor  Acute on chronic normocytic anemia -Hgb dropped this morning - hold coumadin for now - no obvious evidence of blood loss, so will not yet reverse coumdan - follow Hgb trend with recheck this evening - hemodynamically stable   RUE DVT  -diagnosed 10/29/2013 - unclear if related to catheter/device or not  -resume  Coumadin if Hgb remains stable   Substance abuse -UDS positive for marijuana  Social  -Patient has difficulty caring for himself at home due to weakness from stroke  -Potential confused or decreased cognitive state prior to admission  -Uses walker and sometimes a wheelchair  -Stays with sister  -Dietitian for potential care placement  -PT/OT evaluation.  -May need HHSW at discharge if he goes home rather that ALF or SNF  Code Status: FULL Family Communication: no family present at time of exam  Disposition Plan: transfer to med bed   Consultants: NA  Procedure/Significant Events: 09/12/2013 echocardiogram Systolic function normal. LVEF=  55% -60%. -Doppler parameters were normal.  9/30 right upper extremity ultrasound; Right upper extremity is positive for deep and superficial vein thrombosis involving the right axillary, brachial, and cephalic veins.   Antibiotics: NA  DVT  prophylaxis: Coumadin on hold - SCDs  Objective: Blood pressure 160/59, pulse 73, temperature 98.5 F (36.9 C), temperature source Oral, resp. rate 14, height 5\' 7"  (1.702 m), weight 53.071 kg (117 lb), SpO2 97.00%.  Intake/Output Summary (Last 24 hours) at 11/05/13 1411 Last data filed at 11/05/13 1223  Gross per 24 hour  Intake   2655 ml  Output   2050 ml  Net    605 ml   Exam: General: alert - mildly confused - no acute respiratory distress Lungs: Clear to auscultation bilaterally without wheezes or crackles Cardiovascular: Regular rate and rhythm without murmur gallop or rub normal S1 and S2 Abdomen: Nontender, nondistended, soft, bowel sounds positive, no rebound, no ascites, no appreciable mass Extremities: No significant cyanosis, clubbing, or edema bilateral lower extremities. Right upper extremity, swelling from shoulder to wrist  Data Reviewed: Basic Metabolic Panel:  Recent Labs Lab 10/30/13 0850 10/31/13 0435 11/01/13 1252 11/03/13 0731 11/03/13 1040 11/04/13 0329 11/05/13 0455  NA 138 136* 136* 143  --  135* 137  K 4.2 3.7 3.5* 3.3*  --  4.1 4.1  CL 102 97 97 106  --  100 102  CO2 25 23 26 26   --  23 22  GLUCOSE 187* 116* 110* 29*  --  216* 178*  BUN 108* 108* 110* 94*  --  87* 81*  CREATININE 5.00* 4.97* 4.96* 4.28* 4.24* 3.95* 3.80*  CALCIUM 8.1* 7.6* 8.3* 8.2*  --  7.6* 7.7*  MG  --   --   --   --  2.1  --  1.8  PHOS 5.2* 5.6*  --   --   --   --   --    Liver Function Tests:  Recent Labs Lab 10/31/13 0435 11/01/13 1252 11/03/13 0731 11/04/13 0329 11/05/13 0455  AST  --  46* 35 36 32  ALT  --  31 27 24 22   ALKPHOS  --  90 90 99 81  BILITOT  --  0.6 0.7 0.6 0.5  PROT  --  7.5 7.2 6.4 6.2  ALBUMIN 2.1* 2.5* 2.4* 2.0* 2.0*   CBC:  Recent Labs Lab 11/01/13 1252 11/03/13 0731 11/03/13 1040 11/04/13 0329 11/05/13 0455  WBC 5.5 4.7 4.7 7.0 5.4  NEUTROABS 3.6 3.3  --   --  3.5  HGB 10.1* 9.5* 8.4* 8.1* 6.8*  HCT 30.2* 29.1* 25.4* 24.7*  20.9*  MCV 79.5 80.4 80.6 80.5 82.6  PLT 154 159 149* 165 191   Cardiac Enzymes:  Recent Labs Lab 11/03/13 1040 11/03/13 1115 11/03/13 1805 11/03/13 2201 11/05/13 0455  CKTOTAL  --  210  --   --  172  TROPONINI <0.30  --  <0.30 <0.30  --    CBG:  Recent Labs Lab 11/04/13 2130 11/05/13 0017 11/05/13 0350 11/05/13 0742 11/05/13 1207  GLUCAP 166* 166* 175* 117* 201*    Recent Results (from the past 240 hour(s))  MRSA PCR SCREENING     Status: None   Collection Time    11/03/13 12:19 PM      Result Value Ref Range Status   MRSA by PCR NEGATIVE  NEGATIVE Final  Comment:            The GeneXpert MRSA Assay (FDA     approved for NASAL specimens     only), is one component of a     comprehensive MRSA colonization     surveillance program. It is not     intended to diagnose MRSA     infection nor to guide or     monitor treatment for     MRSA infections.     Studies:  Recent x-ray studies have been reviewed in detail by the Attending Physician  Scheduled Meds:  Scheduled Meds: . sodium chloride   Intravenous Once  . amLODipine  10 mg Oral Daily  . aspirin EC  81 mg Oral Daily  . atorvastatin  20 mg Oral q1800  . calcitRIOL  0.25 mcg Oral Daily  . carvedilol  6.25 mg Oral BID WC  . furosemide  80 mg Oral Daily  . insulin aspart  0-15 Units Subcutaneous 6 times per day  . sodium chloride  3 mL Intravenous Q12H  . thiamine  100 mg Oral q morning - 10a  . Warfarin - Pharmacist Dosing Inpatient   Does not apply q1800    Time spent on care of this patient: 35 mins  Lonia BloodJeffrey T. Chetan Mehring, MD [facility] For Consults/Admissions - Flow Manager - [phone] Office  [phone] Pager [phone]  On-Call/Text Page:      [email or web address]      password [email or web address]  11/05/2013, 2:11 PM   LOS: 2 days

## 2013-11-05 NOTE — Progress Notes (Signed)
Pt was transferred to unit via bed. Pt in no apparent distress at this time. VS are stable. Pt was oriented to unit and call bell system.

## 2013-11-06 DIAGNOSIS — I10 Essential (primary) hypertension: Secondary | ICD-10-CM

## 2013-11-06 DIAGNOSIS — E1129 Type 2 diabetes mellitus with other diabetic kidney complication: Secondary | ICD-10-CM

## 2013-11-06 LAB — CBC
HCT: 25.2 % — ABNORMAL LOW (ref 39.0–52.0)
HEMOGLOBIN: 8.5 g/dL — AB (ref 13.0–17.0)
MCH: 26.9 pg (ref 26.0–34.0)
MCHC: 33.7 g/dL (ref 30.0–36.0)
MCV: 79.7 fL (ref 78.0–100.0)
Platelets: 193 10*3/uL (ref 150–400)
RBC: 3.16 MIL/uL — AB (ref 4.22–5.81)
RDW: 15.9 % — ABNORMAL HIGH (ref 11.5–15.5)
WBC: 5.6 10*3/uL (ref 4.0–10.5)

## 2013-11-06 LAB — RENAL FUNCTION PANEL
ANION GAP: 13 (ref 5–15)
Albumin: 2.1 g/dL — ABNORMAL LOW (ref 3.5–5.2)
BUN: 79 mg/dL — AB (ref 6–23)
CHLORIDE: 102 meq/L (ref 96–112)
CO2: 21 meq/L (ref 19–32)
Calcium: 8.2 mg/dL — ABNORMAL LOW (ref 8.4–10.5)
Creatinine, Ser: 3.61 mg/dL — ABNORMAL HIGH (ref 0.50–1.35)
GFR calc non Af Amer: 17 mL/min — ABNORMAL LOW (ref >90)
GFR, EST AFRICAN AMERICAN: 20 mL/min — AB (ref >90)
Glucose, Bld: 230 mg/dL — ABNORMAL HIGH (ref 70–99)
PHOSPHORUS: 3.2 mg/dL (ref 2.3–4.6)
POTASSIUM: 4.5 meq/L (ref 3.7–5.3)
SODIUM: 136 meq/L — AB (ref 137–147)

## 2013-11-06 LAB — TYPE AND SCREEN
ABO/RH(D): B POS
Antibody Screen: NEGATIVE
Unit division: 0

## 2013-11-06 LAB — PROTIME-INR
INR: 1.6 — ABNORMAL HIGH (ref 0.00–1.49)
Prothrombin Time: 19.1 seconds — ABNORMAL HIGH (ref 11.6–15.2)

## 2013-11-06 LAB — GLUCOSE, CAPILLARY
Glucose-Capillary: 286 mg/dL — ABNORMAL HIGH (ref 70–99)
Glucose-Capillary: 292 mg/dL — ABNORMAL HIGH (ref 70–99)

## 2013-11-06 MED ORDER — INSULIN GLARGINE 100 UNIT/ML ~~LOC~~ SOLN: 12.0000 [IU] | Freq: Every day | SUBCUTANEOUS | Status: DC

## 2013-11-06 MED ORDER — WARFARIN SODIUM 3 MG PO TABS
3.0000 mg | ORAL_TABLET | Freq: Once | ORAL | Status: DC
  Filled 2013-11-06: qty 1

## 2013-11-06 MED ORDER — WARFARIN SODIUM 3 MG PO TABS: 3.0000 mg | ORAL_TABLET | Freq: Every day | ORAL | Status: DC

## 2013-11-06 MED ORDER — WARFARIN - PHARMACIST DOSING INPATIENT: Freq: Every day | Status: DC

## 2013-11-06 MED ORDER — ATORVASTATIN CALCIUM 20 MG PO TABS: 20.0000 mg | ORAL_TABLET | Freq: Every day | ORAL | Status: AC

## 2013-11-06 MED ORDER — INSULIN ASPART 100 UNIT/ML ~~LOC~~ SOLN: 0.0000 [IU] | Freq: Three times a day (TID) | SUBCUTANEOUS | Status: DC

## 2013-11-06 MED ORDER — CARVEDILOL 12.5 MG PO TABS: 12.5000 mg | ORAL_TABLET | Freq: Two times a day (BID) | ORAL | Status: DC

## 2013-11-06 MED ORDER — ENOXAPARIN SODIUM 60 MG/0.6ML ~~LOC~~ SOLN: 50.0000 mg | SUBCUTANEOUS | Status: DC

## 2013-11-06 MED ORDER — INSULIN ASPART 100 UNIT/ML ~~LOC~~ SOLN
0.0000 [IU] | Freq: Three times a day (TID) | SUBCUTANEOUS | Status: DC
  Administered 2013-11-06: 5 [IU] via SUBCUTANEOUS

## 2013-11-06 MED ORDER — ACETAMINOPHEN 325 MG PO TABS: 650.0000 mg | ORAL_TABLET | Freq: Four times a day (QID) | ORAL | Status: DC | PRN

## 2013-11-06 MED ORDER — INSULIN GLARGINE 100 UNIT/ML ~~LOC~~ SOLN
10.0000 [IU] | Freq: Every day | SUBCUTANEOUS | Status: DC
  Administered 2013-11-06: 10 [IU] via SUBCUTANEOUS
  Filled 2013-11-06: qty 0.1

## 2013-11-06 NOTE — Discharge Summary (Signed)
Physician Discharge Summary  [name] MostClarence [name] ONG:[id number] DOB: 08-Jun-1955 DOA: 11/03/2013  PCP: Lonia BloodGARBA,LAWAL, MD  Admit date: 11/03/2013 Discharge date: 11/06/2013  Time spent: 45 minutes  Recommendations for Outpatient Follow-up:  1. Closely monitor CBGs patient admitted with recurrent severe hypoglycemia 2. Check INR on 10/9.  Patient with DVT on coumadin/Lovenox- please discontinue Lovenox once INR more than 2 3. PT / OT rehabilitation at [facility] 4. Follow up to establish PCP at [facility] on 10/12 at 11:15 am. 5.  CMET, CBC in 1 week.  Discharge Diagnoses:  Active Problems:   Hypertension   Chronic diastolic CHF (congestive heart failure)   Stroke   Hypoglycemia   History of seizures   Metabolic acidosis   Substance abuse   DM (diabetes mellitus), type 2 with renal complications   Swelling of arm, right   DVT (deep venous thrombosis)   Discharge Condition: stable.  Diet recommendation: carb mod.  Filed Weights   11/03/13 1221 11/05/13 0300 11/05/13 2129  Weight: 51.71 kg (114 lb) 53.071 kg (117 lb) 55.112 kg (121 lb 8 oz)    History of present illness:  58 y.o. male, with a history of CHF, ESRD, CVA, HTN, seizures who presented with altered mental status and fatigue. Pt with hx ESRD not yet on dialysis, recent dx RUE blood clot on coumadin, recent admission for hypoglycemia and altered mental status (9/28-10/2), visit to ED on 10/3 for same, presents today after found unresponsive, decreased respirations by family members.  Family members note that they have been giving him his medications as directed (decreased insulin dosages since discharge 10/2), last night gave him his lantus and made him eat 1/2 peanut butter and jelly sandwich, at the time his cbg noted to be 327.  In the ED his CBG was unreadable - less than 10.    Hospital Course:  Hypoglycemia  -Recurrent and Severe  -Likely due to not eating and taking Insulin. -No signs of  infection  -CKD could be playing into patient's hypoglycemic episodes as lantus is not being metabolized out. -resolved with supportive care  Diabetes type 2 w/ renal complications  -Will discharge again on decreased dose of lantus (12 units) and SSI - Sensitive.Will need to be monitored closely while at SNF.   Hypokalemia  -Resolved monitor closely   Metabolic acidosis  -Appears to have resolved - follow trend  -Continue on BID bicarb tablets.  Hypothermia  -Resolved - likely due to extreme hypoglcemia   Toxic metabolic encephalopathy  -Resolved. -Due primarily to hypoglycemia with likely some component of uremia.  Acute on Chronic stage 5 renal disease  -Worsened by not eating, dehydration, and reportedly vomiting.  -baseline creatinine 09/12/2013 was 4.14 -Resolved with gentle IV fluids with bicarb  -Has left AVF in place w/ thrill palpable  -no acute indication for HD   Hypertension  -poorly controlled - c/w amlodipine and coreg-follow closely while at SNF  ?Congestive heart failure  -no evidence of systolic or diastolic CHF per TTE Aug 2015. Remains clinically compensatede  Stroke history  -continue aspirin 81 mg  -LDL 42 - reported hx of rhabdo believed to have been related to lipitor  -Restarted statin.  Please monitor LFTs.  chronic normocytic anemia  -Hemodynamically stable  -Follow outpatient.  RUE DVT  -diagnosed 10/29/2013 - unclear if related to catheter/device or not  - INR subtherapeutic-as a result will be discharged on oral pain Lovenox/Coumadin-please stop Lovenox once INR more than 2.  Substance abuse  -UDS  positive for marijuana  -Counseled to avoid marijuana and alcohol.   Consultations: None  Discharge Exam: Filed Vitals:   11/05/13 2000 11/05/13 2129 11/06/13 0617 11/06/13 1321  BP: 147/63 153/59 168/63 144/69  Pulse: 69 66 77 65  Temp: 98 F (36.7 C) 98.5 F (36.9 C) 99 F (37.2 C) 98.4 F (36.9 C)  TempSrc: Oral Oral Oral Oral   Resp: 15 14 16 16   Height:  5\' 7"  (1.702 m)    Weight:  55.112 kg (121 lb 8 oz)    SpO2: 97% 97% 95% 98%   General: Awake, alert, lying comfortably in bed.  Speech is clear. Cardiovascular: rrr no m/r/g Respiratory: cta no w/c/r Abdomen:  Soft, Nt, Nd, +bs, no organomegaly. Extremities:  Able to move all 5.  No swelling  Discharge Instructions  Discharge Instructions   Diet Carb Modified    Complete by:  As directed      Increase activity slowly    Complete by:  As directed           Current Discharge Medication List    START taking these medications   Details  acetaminophen (TYLENOL) 325 MG tablet Take 2 tablets (650 mg total) by mouth every 6 (six) hours as needed for mild pain (or Fever >/= 101).    atorvastatin (LIPITOR) 20 MG tablet Take 1 tablet (20 mg total) by mouth daily at 6 PM. Qty: 30 tablet, Refills: 0    carvedilol (COREG) 12.5 MG tablet Take 1 tablet (12.5 mg total) by mouth 2 (two) times daily with a meal.      CONTINUE these medications which have CHANGED   Details  enoxaparin (LOVENOX) 60 MG/0.6ML injection Inject 0.5 mLs (50 mg total) into the skin daily. Qty: 4 Syringe, Refills: 0    insulin aspart (NOVOLOG) 100 UNIT/ML injection Inject 0-9 Units into the skin 3 (three) times daily with meals. Qty: 10 mL, Refills: 11    insulin glargine (LANTUS) 100 UNIT/ML injection Inject 0.12 mLs (12 Units total) into the skin at bedtime. Qty: 10 mL, Refills: 11    warfarin (COUMADIN) 3 MG tablet Take 1 tablet (3 mg total) by mouth daily at 6 PM. Qty: 5 tablet, Refills: 0      CONTINUE these medications which have NOT CHANGED   Details  amLODipine (NORVASC) 10 MG tablet Take 10 mg by mouth daily.    aspirin 81 MG tablet Take 81 mg by mouth daily.    calcitRIOL (ROCALTROL) 0.25 MCG capsule Take 1 capsule (0.25 mcg total) by mouth daily. Qty: 30 capsule, Refills: 0    calcium acetate (PHOSLO) 667 MG capsule Take 667 mg by mouth 3 (three) times daily with  meals.    furosemide (LASIX) 80 MG tablet Take 1 tablet (80 mg total) by mouth daily. Qty: 30 tablet, Refills: 0    sodium bicarbonate 650 MG tablet Take 2 tablets (1,300 mg total) by mouth 2 (two) times daily. Qty: 120 tablet, Refills: 1    thiamine (VITAMIN B-1) 100 MG tablet Take 100 mg by mouth every morning.    glucose blood test strip Use as instructed Qty: 100 each, Refills: 12      STOP taking these medications     HYDROcodone-acetaminophen (NORCO/VICODIN) 5-325 MG per tablet        Allergies  Allergen Reactions  . Pork-Derived Conservation officer, nature and Other (See Comments)    The patient has tolerated lovenox   Follow-up Information   Follow up with [facility]     On 11/10/2013. (11:15 for hospital follow up)    Contact information:   [address] [place] [place] [address] [phone]       The results of significant diagnostics from this hospitalization (including imaging, microbiology, ancillary and laboratory) are listed below for reference.    Significant Diagnostic Studies: Dg Chest 2 View  11/03/2013   CLINICAL DATA:  Altered mental status.  Status post fall.  EXAM: CHEST  2 VIEW  COMPARISON:  PA and lateral chest 10/27/2013. Single view of the chest 10/06/2013.  FINDINGS: Lungs are clear. No pneumothorax or pleural effusion. Heart size is normal. Loop recorder is again seen.  IMPRESSION: No acute disease.   Electronically Signed   By: Drusilla Kanner M.D.   On: 11/03/2013 09:22   Dg Chest 2 View  10/27/2013   CLINICAL DATA:  Hypoglycemia.  EXAM: CHEST  2 VIEW  COMPARISON:  10/06/2013.  FINDINGS: The lungs are clear without focal infiltrate, edema, pneumothorax or pleural effusion. The cardiopericardial silhouette is within normal limits for size. Imaged bony structures of the thorax are intact.  IMPRESSION: Interval resolution of left lung airspace disease. No acute cardiopulmonary findings on today's study.   Electronically  Signed   By: Kennith Center M.D.   On: 10/27/2013 09:07   Dg Lumbar Spine Complete  11/03/2013   CLINICAL DATA:  Low back pain.  Possible fall.  EXAM: LUMBAR SPINE - COMPLETE 4+ VIEW  COMPARISON:  None.  FINDINGS: Vertebral body height and alignment are maintained. There is loss of disc space height and endplate sclerosis at L4-5. Paraspinous structures demonstrate atherosclerotic vascular disease.  IMPRESSION: No acute finding.  L4-5 degenerative disc disease.  Atherosclerosis.   Electronically Signed   By: Drusilla Kanner M.D.   On: 11/03/2013 09:24   Dg Hip Complete Left  11/03/2013   CLINICAL DATA:  Possible Fall.  Low back pain.  EXAM: LEFT HIP - COMPLETE 2+ VIEW  COMPARISON:  None.  FINDINGS: No acute bony or joint abnormality is identified. Lower lumbar degenerative change is noted. Atherosclerotic vascular disease is identified.  IMPRESSION: No acute finding.   Electronically Signed   By: Drusilla Kanner M.D.   On: 11/03/2013 09:23   Ct Head Wo Contrast  11/03/2013   CLINICAL DATA:  History of hypertension.  Stroke.  EXAM: CT HEAD WITHOUT CONTRAST  TECHNIQUE: Contiguous axial images were obtained from the base of the skull through the vertex without intravenous contrast.  COMPARISON:  10/27/2013.  FINDINGS: No intra-axial or extra-axial pathologic fluid or blood collection. No mass. No hydrocephalus. Punctate lucencies noted in the basal ganglia consistent with old lacunar infarcts. White matter changes noted consistent with chronic ischemia. No acute bony abnormality.  IMPRESSION: 1. Chronic ischemic change noted in the basal ganglia and deep white matter. 2. No acute abnormality.   Electronically Signed   By: Maisie Fus  Register   On: 11/03/2013 08:03   Ct Head Wo Contrast  10/27/2013   CLINICAL DATA:  Altered level of consciousness.  EXAM: CT HEAD WITHOUT CONTRAST  TECHNIQUE: Contiguous axial images were obtained from the base of the skull through the vertex without intravenous contrast.   COMPARISON:  Head CT scan 10/06/2013.  FINDINGS: Scattered areas of hypoattenuation in the subcortical and periventricular deep white matter are again seen consistent chronic microvascular ischemic change. The brain is mildly atrophic. No evidence of acute abnormality including infarct, hemorrhage, mass lesion, mass effect, midline shift or abnormal  extra-axial fluid collection is identified. There is no hydrocephalus or pneumocephalus. The calvarium is intact.  IMPRESSION: No acute finding.  Stable compared to prior exam.   Electronically Signed   By: Drusilla Kanner M.D.   On: 10/27/2013 09:18   Dg Knee Complete 4 Views Left  11/03/2013   CLINICAL DATA:  Sore all over with lacerations along anterior left knee.  EXAM: LEFT KNEE - COMPLETE 4+ VIEW  COMPARISON:  None.  FINDINGS: Patella appears to be bipartite. Prepatellar soft tissue swelling is minimal. No joint effusion. No significant degenerative changes. There are vascular calcifications.  IMPRESSION: 1. No definite fracture.  No joint effusion. 2. Patella appears bipartite. Prepatellar soft tissue swelling is minimal.   Electronically Signed   By: Leanna Battles M.D.   On: 11/03/2013 09:25    Microbiology: Recent Results (from the past 240 hour(s))  MRSA PCR SCREENING     Status: None   Collection Time    11/03/13 12:19 PM      Result Value Ref Range Status   MRSA by PCR NEGATIVE  NEGATIVE Final   Comment:            The GeneXpert MRSA Assay (FDA     approved for NASAL specimens     only), is one component of a     comprehensive MRSA colonization     surveillance program. It is not     intended to diagnose MRSA     infection nor to guide or     monitor treatment for     MRSA infections.     Labs: Basic Metabolic Panel:  Recent Labs Lab 10/31/13 0435 11/01/13 1252 11/03/13 0731 11/03/13 1040 11/04/13 0329 11/05/13 0455 11/06/13 0505  NA 136* 136* 143  --  135* 137 136*  K 3.7 3.5* 3.3*  --  4.1 4.1 4.5  CL 97 97 106  --   100 102 102  CO2 23 26 26   --  23 22 21   GLUCOSE 116* 110* 29*  --  216* 178* 230*  BUN 108* 110* 94*  --  87* 81* 79*  CREATININE 4.97* 4.96* 4.28* 4.24* 3.95* 3.80* 3.61*  CALCIUM 7.6* 8.3* 8.2*  --  7.6* 7.7* 8.2*  MG  --   --   --  2.1  --  1.8  --   PHOS 5.6*  --   --   --   --   --  3.2   Liver Function Tests:  Recent Labs Lab 11/01/13 1252 11/03/13 0731 11/04/13 0329 11/05/13 0455 11/06/13 0505  AST 46* 35 36 32  --   ALT 31 27 24 22   --   ALKPHOS 90 90 99 81  --   BILITOT 0.6 0.7 0.6 0.5  --   PROT 7.5 7.2 6.4 6.2  --   ALBUMIN 2.5* 2.4* 2.0* 2.0* 2.1*    CBC:  Recent Labs Lab 11/01/13 1252 11/03/13 0731 11/03/13 1040 11/04/13 0329 11/05/13 0455 11/05/13 1925 11/06/13 0505  WBC 5.5 4.7 4.7 7.0 5.4 5.6 5.6  NEUTROABS 3.6 3.3  --   --  3.5  --   --   HGB 10.1* 9.5* 8.4* 8.1* 6.8* 8.2* 8.5*  HCT 30.2* 29.1* 25.4* 24.7* 20.9* 25.0* 25.2*  MCV 79.5 80.4 80.6 80.5 82.6 80.1 79.7  PLT 154 159 149* 165 191 184 193   Cardiac Enzymes:  Recent Labs Lab 11/03/13 1040 11/03/13 1115 11/03/13 1805 11/03/13 2201 11/05/13 0455  CKTOTAL  --  210  --   --  172  TROPONINI <0.30  --  <0.30 <0.30  --    CBG:  Recent Labs Lab 11/05/13 1207 11/05/13 1615 11/05/13 2124 11/06/13 0814 11/06/13 1208  GLUCAP 201* 216* 240* 286* 292*     Signed:  Conley Canal  [facility] 11/06/2013, 3:16 PM  Attending Patient seen and examined, agree with the above assessment and plan. Admitted- second time-for recurrent hypoglycemia-likely secondary to decreased oral intake in a setting of marijuana use. CBGs stable, will restart low-dose Lantus to 12 units along with SSI.Patient agreeable to be discharged to a skilled nursing facility. Also recently diagnosed with a DVT in the right upper extremity-INR is subtherapeutic, we will overlap with Lovenox until INR therapeutic.  Windell Norfolk MD

## 2013-11-06 NOTE — Progress Notes (Signed)
Report called to [facility] at [facility].

## 2013-11-06 NOTE — Care Management Note (Signed)
    Page 1 of 1   11/06/2013     3:44:15 PM CARE MANAGEMENT NOTE 11/06/2013  [facility]  [name]   Account Number:  [id number]  Date Initiated:  11/06/2013  Documentation initiated by:  [name] CapeAYLOR,[name] [name]  Subjective/Objective Assessment:   dx hypoglyemia     Action/Plan:   Anticipated DC Date:  11/06/2013   Anticipated DC Plan:  SKILLED NURSING FACILITY  In-house referral  Clinical Social Worker      DC Planning Services  CM consult      Choice offered to / List presented to:             Status of service:  Completed, signed off Medicare Important Message given?  YES (If response is "NO", the following Medicare IM given date fields will be blank) Date Medicare IM given:  11/06/2013 Medicare IM given by:  [name] CapeAYLOR,[name] [name] Date Additional Medicare IM given:   Additional Medicare IM given by:    Discharge Disposition:  SKILLED NURSING FACILITY  Per UR Regulation:  Reviewed for med. necessity/level of care/duration of stay  If discussed at Long Length of Stay Meetings, dates discussed:    Comments:

## 2013-11-06 NOTE — Progress Notes (Signed)
ANTICOAGULATION CONSULT NOTE  Pharmacy Consult for warfarin Indication: DVT  Allergies  Allergen Reactions  . Pork-Derived Conservation officer, natureroducts Hives and Other (See Comments)    The patient has tolerated lovenox    Patient Measurements: Height: 5\' 7"  (170.2 cm) Weight: 121 lb 8 oz (55.112 kg) IBW/kg (Calculated) : 66.1   Vital Signs: Temp: 99 F (37.2 C) (10/08 0617) Temp Source: Oral (10/08 0617) BP: 168/63 mmHg (10/08 0617) Pulse Rate: 77 (10/08 0617)  Labs:  Recent Labs  11/03/13 1040 11/03/13 1115 11/03/13 1805 11/03/13 2201 11/04/13 0329 11/05/13 0455 11/05/13 1925 11/06/13 0505  HGB 8.4*  --   --   --  8.1* 6.8* 8.2* 8.5*  HCT 25.4*  --   --   --  24.7* 20.9* 25.0* 25.2*  PLT 149*  --   --   --  165 191 184 193  LABPROT  --   --   --   --  29.1* 22.2*  --  19.1*  INR  --   --   --   --  2.75* 1.95*  --  1.60*  CREATININE 4.24*  --   --   --  3.95* 3.80*  --  3.61*  CKTOTAL  --  210  --   --   --  172  --   --   TROPONINI <0.30  --  <0.30 <0.30  --   --   --   --     Estimated Creatinine Clearance: 17.6 ml/min (by C-G formula based on Cr of 3.61).   Medical History: Past Medical History  Diagnosis Date  . Hypertension   . CHF (congestive heart failure)   . Seizures   . Tuberculosis 1960's    "got the tx for it" (10/27/2013)  . Type II diabetes mellitus   . Hepatitis C   . Stroke 06/2013    "LLE paralyzed since" (10/27/2013)  . Arthritis     "left knee hurts" (10/27/2013)  . Chronic kidney disease (CKD), stage IV (severe)   . Hypoglycemia 11/03/2013     Assessment: 58 year old male brought in with AMS and hypoglycemia, also with recent dx of RUE DVT discharged 10/2 on Lovenox 50 mg subq daily and warfarin 2 mg po daily. On admission, INR was elevated at 3.21. Patient with poor renal function, Scr 3.6, eCrCl ~15-20 m/min, not yet on HD.  Warfarin was held yesterday due to an acute drop in hemoglobin down to 6.8, pt received 1 unit pRBC and hgb returned to 8.5  this morning.  Current INR is subtherapeutic at 1.6.  Goal of Therapy:  INR 2-3 Monitor platelets by anticoagulation protocol: Yes   Plan:  1. Warfarin 3 mg po x1 2. Daily PT/INR 3. Monitor hgb 4. Monitor for s/sx bleeding    Agapito GamesAlison Flordia Kassem, PharmD, BCPS Clinical Pharmacist Pager: [phone] 11/06/2013 9:17 AM

## 2013-11-06 NOTE — Clinical Social Work Psychosocial (Signed)
Clinical Social Work Department BRIEF PSYCHOSOCIAL ASSESSMENT 11/06/2013  Patient:  [facility]     Account Number:  [id number]     Admit date:  11/03/2013  Clinical Social Worker:  Lovey Newcomer  Date/Time:  11/06/2013 10:30 AM  Referred by:  Physician  Date Referred:  11/06/2013 Referred for  SNF Placement   Other Referral:   NA   Interview type:  Patient Other interview type:   Patient alert and oriented at time of assessment.    PSYCHOSOCIAL DATA Living Status:  ALONE Admitted from facility:   Level of care:   Primary support name:  Celeste Primary support relationship to patient:  SIBLING Degree of support available:   Support is fair.    CURRENT CONCERNS Current Concerns  Post-Acute Placement   Other Concerns:   NA    SOCIAL WORK ASSESSMENT / PLAN CSW met with patient at bedside to complete assessment. Patient states that he is in agreement that he needs short term SNF placement. Patient states that he wants to go to a facility close to the hospital. CSW explained SNF search/placement process and answered patient and sister's questions. Sister will complete paperwork at the facility. Patient was calm and engaged in assessment. He appeared indifferent about placement.   Assessment/plan status:  Psychosocial Support/Ongoing Assessment of Needs Other assessment/ plan:   Complete Fl2, Fax, PASRR   Information/referral to community resources:   CSW contact information and SNF list given.    PATIENT'S/FAMILY'S RESPONSE TO PLAN OF CARE: Patient states that he is agreeable to DC to  SNF when stable. CSW will assist.         Liz Beach MSW, [place], [place], [phone]

## 2013-11-06 NOTE — Progress Notes (Signed)
Occupational Therapy Evaluation Patient Details Name: [name] [name] MRN: [id number] DOB: 04-Mar-1955 Today's Date: 11/06/2013    History of Present Illness 58 y.o. male, with a history of CHF, ESRD, CVA, HTN, seizures who presented with altered mental status and fatigue when his sister saw him this morning at his house. Pt found to be hypoglycemic.   Clinical Impression   Patient presents with decreased ADL independence and safety. Will benefit from skilled OT to address deficits below.    Follow Up Recommendations  Supervision/Assistance - 24 hour;Home health OT    Equipment Recommendations  None recommended by OT    Recommendations for Other Services       Precautions / Restrictions Precautions Precautions: Fall Precaution Comments: R UE DVT Restrictions Weight Bearing Restrictions: No      Mobility Bed Mobility Overal bed mobility: Needs Assistance Bed Mobility: Supine to Sit Rolling: Mod assist     Sit to supine: Min assist   General bed mobility comments: To get L LE over and to get trunk up, but pt reports he needs help only due the bed being different than his.  Transfers Overall transfer level: Needs assistance Equipment used: Rolling walker (2 wheeled) Transfers: Sit to/from Stand Sit to Stand: Min guard              Balance     Sitting balance-Leahy Scale: Fair     Standing balance support: Bilateral upper extremity supported Standing balance-Leahy Scale: Fair (with RW)                              ADL Overall ADL's : Needs assistance/impaired Eating/Feeding: Set up;Bed level   Grooming: Set up;Sitting           Upper Body Dressing : Sitting;Minimal assistance                   Functional mobility during ADLs: Minimal assistance General ADL Comments: Patient reluctant to participate in ADLs. Stated his family "is always there" and "always helps." Happier once he got up to recliner.     Vision                     Perception     Praxis      Pertinent Vitals/Pain Pain Assessment: 0-10 Pain Score: 3  Pain Location: RUE Pain Descriptors / Indicators: Aching Pain Intervention(s): Monitored during session     Hand Dominance Right   Extremity/Trunk Assessment Upper Extremity Assessment Upper Extremity Assessment: Overall WFL for tasks assessed RUE Deficits / Details: Patient with some edema and pain RUE but able to use functionally   Lower Extremity Assessment Lower Extremity Assessment: Generalized weakness       Communication Communication Communication: No difficulties   Cognition Arousal/Alertness: Awake/alert Behavior During Therapy: WFL for tasks assessed/performed;Agitated Overall Cognitive Status: Within Functional Limits for tasks assessed Area of Impairment: Safety/judgement       Following Commands: Follows one step commands consistently Safety/Judgement: Decreased awareness of safety     General Comments: Pt requiring increased encouragement to get OOB, but happy to be up once in recliner.  Resistive to questions from therapists and not wanting to volunteer much information.   General Comments       Exercises       Shoulder Instructions      Home Living Family/patient expects to be discharged to:: Private residence Living Arrangements: Other relatives Available Help at Discharge: Family;Available 24  hours/day Type of Home: Apartment Home Access: Level entry     Home Layout: Two level;Bed/bath upstairs Alternate Level Stairs-Number of Steps: 16 Alternate Level Stairs-Rails: Right Bathroom Shower/Tub: Chief Strategy Officer: Standard     Home Equipment: Cane - single point;Walker - 2 wheels;Wheelchair - manual;Toilet riser;Shower seat          Prior Functioning/Environment Level of Independence: Independent with assistive device(s)             OT Diagnosis: Generalized weakness   OT Problem List: Decreased  strength;Decreased range of motion;Decreased activity tolerance;Impaired balance (sitting and/or standing);Decreased cognition;Decreased coordination;Decreased safety awareness;Decreased knowledge of use of DME or AE;Decreased knowledge of precautions;Impaired sensation;Impaired UE functional use;Pain;Increased edema   OT Treatment/Interventions: Self-care/ADL training;Therapeutic exercise;Energy conservation;DME and/or AE instruction;Therapeutic activities;Cognitive remediation/compensation;Patient/family education;Balance training    OT Goals(Current goals can be found in the care plan section) Acute Rehab OT Goals Patient Stated Goal: go home OT Goal Formulation: With patient Time For Goal Achievement: 11/20/13 Potential to Achieve Goals: Fair  OT Frequency: Min 2X/week   Barriers to D/C:            Co-evaluation   Reason for Co-Treatment: For patient/therapist safety;Complexity of the patient's impairments (multi-system involvement) PT goals addressed during session: Proper use of DME        End of Session Equipment Utilized During Treatment: Gait belt;Rolling walker Nurse Communication: Mobility status  Activity Tolerance: Patient tolerated treatment well Patient left: in chair;with call bell/phone within reach;with chair alarm set   Time: 1021-1040 OT Time Calculation (min): 19 min Charges:  OT General Charges $OT Visit: 1 Procedure OT Evaluation $Initial OT Evaluation Tier I: 1 Procedure OT Treatments $Therapeutic Activity: 8-22 mins G-Codes:    Dreama Kuna A 12-06-2013, 11:39 AM

## 2013-11-06 NOTE — Evaluation (Signed)
Physical Therapy Evaluation Patient Details Name: [name] [name] MRN: [id number] DOB: 1955/10/06 Today's Date: 11/06/2013   History of Present Illness  58 y.o. male, with a history of CHF, ESRD, CVA, HTN, seizures who presented with altered mental status and fatigue when his sister saw him this morning at his house. Pt found to be hypoglycemic.  Clinical Impression  Pt admitted with hypoglycemia. Pt currently with functional limitations due to the deficits listed below (see PT Problem List).  Pt will benefit from skilled PT to increase their independence and safety with mobility to allow discharge to the venue listed below. Pt was recently d/c from hospital and he states he currently gets HHPT.  Recommend continuing HHPT services.     Follow Up Recommendations Home health PT;Supervision/Assistance - 24 hour    Equipment Recommendations  None recommended by PT    Recommendations for Other Services       Precautions / Restrictions Precautions Precautions: Fall Precaution Comments: R UE DVT Restrictions Weight Bearing Restrictions: No      Mobility  Bed Mobility Overal bed mobility: Needs Assistance Bed Mobility: Supine to Sit       Sit to supine: Min assist   General bed mobility comments: To get L LE over and to get trunk up, but pt reports he needs help only due the bed being different than his.  Transfers Overall transfer level: Needs assistance Equipment used: Rolling walker (2 wheeled) Transfers: Sit to/from Stand Sit to Stand: Min guard            Ambulation/Gait Ambulation/Gait assistance: Min guard Ambulation Distance (Feet): 40 Feet Assistive device: Rolling walker (2 wheeled) Gait Pattern/deviations: Step-to pattern;Decreased step length - right     General Gait Details: gait with step to pattern due to L LE weakness.  Stairs            Wheelchair Mobility    Modified Rankin (Stroke Patients Only)       Balance     Sitting  balance-Leahy Scale: Fair     Standing balance support: Bilateral upper extremity supported Standing balance-Leahy Scale: Fair (with RW)                               Pertinent Vitals/Pain Pain Assessment: 0-10 Pain Score: 3  Pain Location: R UE Pain Descriptors / Indicators: Aching Pain Intervention(s): Monitored during session;Limited activity within patient's tolerance;Repositioned    Home Living Family/patient expects to be discharged to:: Private residence Living Arrangements: Other relatives Available Help at Discharge: Family;Available 24 hours/day Type of Home: Apartment Home Access: Level entry     Home Layout: Two level;Bed/bath upstairs Home Equipment: Cane - single point;Walker - 2 wheels;Wheelchair - manual;Toilet riser;Shower seat      Prior Function Level of Independence: Independent with assistive device(s)               Hand Dominance        Extremity/Trunk Assessment   Upper Extremity Assessment: Defer to OT evaluation           Lower Extremity Assessment: Generalized weakness (hx of L weakness due to CVA)         Communication      Cognition Arousal/Alertness: Awake/alert Behavior During Therapy: WFL for tasks assessed/performed;Agitated Overall Cognitive Status: Within Functional Limits for tasks assessed Area of Impairment: Safety/judgement       Following Commands: Follows one step commands consistently Safety/Judgement: Decreased awareness of safety  General Comments: Pt requiring increased encouragement to get OOB, but happy to be up once in recliner.  Resistive to questions from therapists and not wanting to volunteer much information.    General Comments      Exercises        Assessment/Plan    PT Assessment Patient needs continued PT services  PT Diagnosis Difficulty walking;Generalized weakness   PT Problem List Decreased strength;Decreased balance;Decreased mobility;Decreased knowledge of use  of DME;Decreased coordination  PT Treatment Interventions DME instruction;Gait training;Stair training;Functional mobility training;Therapeutic activities;Therapeutic exercise;Balance training   PT Goals (Current goals can be found in the Care Plan section) Acute Rehab PT Goals Patient Stated Goal: go home PT Goal Formulation: With patient Time For Goal Achievement: 11/13/13 Potential to Achieve Goals: Good    Frequency Min 3X/week   Barriers to discharge        Co-evaluation PT/OT/SLP Co-Evaluation/Treatment: Yes Reason for Co-Treatment: For patient/therapist safety;Complexity of the patient's impairments (multi-system involvement) PT goals addressed during session: Proper use of DME;Mobility/safety with mobility         End of Session Equipment Utilized During Treatment: Gait belt Activity Tolerance: Patient tolerated treatment well Patient left: in chair;with call bell/phone within reach;with chair alarm set Nurse Communication: Mobility status         Time: 1021-1040 PT Time Calculation (min): 19 min   Charges:   PT Evaluation $Initial PT Evaluation Tier I: 1 Procedure PT Treatments $Gait Training: 8-22 mins   PT G Codes:          Jamin Humphries LUBECK 11/06/2013, 11:11 AM

## 2013-11-06 NOTE — Progress Notes (Signed)
  Ambulance here to pick pt [facility] at [facility]

## 2013-11-06 NOTE — Clinical Social Work Placement (Signed)
Clinical Social Work Department CLINICAL SOCIAL WORK PLACEMENT NOTE 11/06/2013  Patient:  [name] [name],[name] [name]  Account Number:  [id number] Admit date:  11/03/2013  Clinical Social Worker:  Lavell LusterJOSEPH BRYANT [name] [name], LCSWA  Date/time:  11/06/2013 02:11 PM  Clinical Social Work is seeking post-discharge placement for this patient at the following level of care:   SKILLED NURSING   (*CSW will update this form in Epic as items are completed)   11/06/2013  [facility]/family provided with [facility] of Clinical Social Work's list of facilities offering this level of care within the geographic area requested by the patient (or if unable, by the patient's family).  11/06/2013  Patient/family informed of their freedom to choose among providers that offer the needed level of care, that participate in Medicare, Medicaid or managed care program needed by the patient, have an available bed and are willing to accept the patient.  11/06/2013  Patient/family informed of [facility]' ownership interest in [facility], as well as of the fact that they are under no obligation to receive care at this facility.  PASARR submitted to EDS on 11/06/2013 PASARR number received on 11/06/2013  FL2 transmitted to all facilities in geographic area requested by pt/family on  11/06/2013 FL2 transmitted to all facilities within larger geographic area on   Patient informed that his/her managed care company has contracts with or will negotiate with  certain facilities, including the following:     Patient/family informed of bed offers received:  11/06/2013 [facility] Physician recommends and patient chooses bed at    [facility] on  11/06/2013 Patient to be transferred to facility by Ambulance Patient and family notified of transfer on 11/06/2013 Name of family member notified:  Park Meoeleste  The following  physician request were entered in Epic:   Additional Comments:   Per MD [facility]. RN, patient, patient's family, and facility notified of DC. RN given number for report. DC packet on chart. AMbulance transport requested for patient. CSW signing off.    Roddie McBryant Mele Sylvester MSW, [place]CSWA, [place], [phone]

## 2013-11-10 ENCOUNTER — Inpatient Hospital Stay: Payer: Medicare Other | Admitting: Family Medicine

## 2013-11-12 ENCOUNTER — Non-Acute Institutional Stay (SKILLED_NURSING_FACILITY): Payer: Medicare Other | Admitting: Internal Medicine

## 2013-11-12 DIAGNOSIS — N189 Chronic kidney disease, unspecified: Secondary | ICD-10-CM

## 2013-11-12 DIAGNOSIS — N185 Chronic kidney disease, stage 5: Secondary | ICD-10-CM

## 2013-11-12 DIAGNOSIS — I10 Essential (primary) hypertension: Secondary | ICD-10-CM

## 2013-11-12 DIAGNOSIS — F1910 Other psychoactive substance abuse, uncomplicated: Secondary | ICD-10-CM

## 2013-11-12 DIAGNOSIS — T68XXXD Hypothermia, subsequent encounter: Secondary | ICD-10-CM

## 2013-11-12 DIAGNOSIS — E872 Acidosis, unspecified: Secondary | ICD-10-CM

## 2013-11-12 DIAGNOSIS — I639 Cerebral infarction, unspecified: Secondary | ICD-10-CM

## 2013-11-12 DIAGNOSIS — N179 Acute kidney failure, unspecified: Secondary | ICD-10-CM

## 2013-11-12 DIAGNOSIS — E1122 Type 2 diabetes mellitus with diabetic chronic kidney disease: Secondary | ICD-10-CM

## 2013-11-12 DIAGNOSIS — I82401 Acute embolism and thrombosis of unspecified deep veins of right lower extremity: Secondary | ICD-10-CM

## 2013-11-12 DIAGNOSIS — E162 Hypoglycemia, unspecified: Secondary | ICD-10-CM

## 2013-11-12 NOTE — Progress Notes (Signed)
MRN: [id number] Name: [name] [name]  Sex: male Age: 58 y.o. DOB: 01/01/1956  [name] Facility/Room: 112 Level Of Care: SNF Provider: Merrilee SeashoreALEXANDER, ANNE D Emergency Contacts: Extended Emergency Contact Information Primary Emergency Contact: Ransome,Celeste Address: [address]          [place], [place] [id number] [place] Mobile Phone: [phone] Relation: Sister    Allergies: Pork-derived products  Chief Complaint  Patient presents with  . New Admit To SNF    HPI: Patient is 58 y.o. male who was hospitalized for extreme hypoglycemia 2/2 very brittle DM who is admitted to SNF for generalized weakness and RUE DVT.  Past Medical History  Diagnosis Date  . Hypertension   . CHF (congestive heart failure)   . Seizures   . Tuberculosis 1960's    "got the tx for it" (10/27/2013)  . Type II diabetes mellitus   . Hepatitis C   . Stroke 06/2013    "LLE paralyzed since" (10/27/2013)  . Arthritis     "left knee hurts" (10/27/2013)  . Chronic kidney disease (CKD), stage IV (severe)   . Hypoglycemia 11/03/2013    Past Surgical History  Procedure Laterality Date  . Av fistula placement Left 09/11/2013    Procedure: Insertion of arteriovenous gortex graft;  Surgeon: Chuck Hinthristopher S Dickson, MD;  [facility];  Service: Vascular;  Laterality: Left;  . Laceration repair Left ?2014    "cut my leg falling off a truck"  . Loop recorder implant  05/2012      Medication List       This list is accurate as of: 11/12/13 11:59 PM.  Always use your most recent med list.               acetaminophen 325 MG tablet  Commonly known as:  TYLENOL  Take 2 tablets (650 mg total) by mouth every 6 (six) hours as needed for mild pain (or Fever >/= 101).     amLODipine 10 MG tablet  Commonly known as:  NORVASC  Take 10 mg by mouth daily.     aspirin 81 MG tablet  Take 81 mg by mouth daily.     atorvastatin 20 MG tablet  Commonly known as:  LIPITOR  Take 1  tablet (20 mg total) by mouth daily at 6 PM.     calcitRIOL 0.25 MCG capsule  Commonly known as:  ROCALTROL  Take 1 capsule (0.25 mcg total) by mouth daily.     calcium acetate 667 MG capsule  Commonly known as:  PHOSLO  Take 667 mg by mouth 3 (three) times daily with meals.     carvedilol 12.5 MG tablet  Commonly known as:  COREG  Take 1 tablet (12.5 mg total) by mouth 2 (two) times daily with a meal.     enoxaparin 60 MG/0.6ML injection  Commonly known as:  LOVENOX  Inject 0.5 mLs (50 mg total) into the skin daily.     furosemide 80 MG tablet  Commonly known as:  LASIX  Take 1 tablet (80 mg total) by mouth daily.     glucose blood test strip  Use as instructed     insulin aspart 100 UNIT/ML injection  Commonly known as:  novoLOG  Inject 0-9 Units into the skin 3 (three) times daily with meals.     insulin glargine 100 UNIT/ML injection  Commonly known as:  LANTUS  Inject 0.12 mLs (12 Units total) into the skin at bedtime.  sodium bicarbonate 650 MG tablet  Take 2 tablets (1,300 mg total) by mouth 2 (two) times daily.     thiamine 100 MG tablet  Commonly known as:  VITAMIN B-1  Take 100 mg by mouth every morning.     warfarin 3 MG tablet  Commonly known as:  COUMADIN  Take 1 tablet (3 mg total) by mouth daily at 6 PM.        No orders of the defined types were placed in this encounter.    Immunization History  Administered Date(s) Administered  . Influenza-Unspecified 10/14/2013  . Pneumococcal Polysaccharide-23 09/11/2013    History  Substance Use Topics  . Smoking status: Current Every Day Smoker -- 0.25 packs/day for 42 years    Types: Cigarettes  . Smokeless tobacco: Never Used  . Alcohol Use: No    Family history is noncontributory    Review of Systems  DATA OBTAINED: from patient GENERAL:  no fevers, fatigue, appetite changes SKIN: No itching, rash or wounds EYES: No eye pain, redness, discharge EARS: No earache, tinnitus, change in  hearing NOSE: No congestion, drainage or bleeding  MOUTH/THROAT: No mouth or tooth pain, No sore throat RESPIRATORY: No cough, wheezing, SOB CARDIAC: No chest pain, palpitations, lower extremity edema  GI: No abdominal pain, No N/V/D or constipation, No heartburn or reflux  GU: No dysuria, frequency or urgency, or incontinence  MUSCULOSKELETAL: No unrelieved bone/joint pain NEUROLOGIC: No headache, dizziness or focal weakness PSYCHIATRIC: No overt anxiety or sadness, No behavior issue.   Filed Vitals:   11/12/13 0935  BP: 110/54  Pulse: 54  Temp: 97.1 F (36.2 C)  Resp: 18    Physical Exam  GENERAL APPEARANCE: Alert, conversant,  No acute distress.  SKIN: No diaphoresis rash HEAD: Normocephalic, atraumatic  EYES: Conjunctiva/lids clear. Pupils round, reactive. EOMs intact.  EARS: External exam WNL, canals clear. Hearing grossly normal.  NOSE: No deformity or discharge.  MOUTH/THROAT: Lips w/o lesions  RESPIRATORY: Breathing is even, unlabored. Lung sounds are clear   CARDIOVASCULAR: Heart RRR no murmurs, rubs or gallops. No peripheral edema.   GASTROINTESTINAL: Abdomen is soft, non-tender, not distended w/ normal bowel sounds. GENITOURINARY: Bladder non tender, not distended  MUSCULOSKELETAL: No abnormal joints or musculature NEUROLOGIC:  Cranial nerves 2-12 grossly intact. Moves all extremities  PSYCHIATRIC: Mood and affect appropriate to situation, no behavioral issues  Patient Active Problem List   Diagnosis Date Noted  . Accidental hypothermia 11/13/2013  . Stage 5 chronic kidney disease due to type 2 diabetes mellitus 11/13/2013  . Acute kidney injury 11/13/2013  . DVT (deep venous thrombosis) 10/30/2013  . Swelling of arm, right 10/29/2013  . DM (diabetes mellitus), type 2 with renal complications 10/28/2013  . Rhabdomyolysis 10/28/2013  . Hypoglycemia 10/27/2013  . Hyperkalemia 10/27/2013  . History of seizures 10/27/2013  . Metabolic acidosis 10/27/2013  .  Substance abuse 10/27/2013  . End stage renal disease 10/01/2013  . Protein-calorie malnutrition, severe 09/12/2013  . DKA (diabetic ketoacidoses) 09/12/2013  . H/O: CVA (cerebrovascular accident) 09/09/2013  . Hypertension   . Chronic diastolic CHF (congestive heart failure)   . Stroke   . Seizures   . Hyperglycemia 09/08/2013  . Hyperglycemic crisis in diabetes mellitus 09/08/2013    CBC    Component Value Date/Time   WBC 5.6 11/06/2013 0505   RBC 3.16* 11/06/2013 0505   RBC 5.13 10/28/2013 0535   HGB 8.5* 11/06/2013 0505   HCT 25.2* 11/06/2013 0505   PLT 193 11/06/2013 0505  MCV 79.7 11/06/2013 0505   LYMPHSABS 1.5 11/05/2013 0455   MONOABS 0.4 11/05/2013 0455   EOSABS 0.1 11/05/2013 0455   BASOSABS 0.0 11/05/2013 0455    CMP     Component Value Date/Time   NA 136* 11/06/2013 0505   K 4.5 11/06/2013 0505   CL 102 11/06/2013 0505   CO2 21 11/06/2013 0505   GLUCOSE 230* 11/06/2013 0505   BUN 79* 11/06/2013 0505   CREATININE 3.61* 11/06/2013 0505   CALCIUM 8.2* 11/06/2013 0505   PROT 6.2 11/05/2013 0455   ALBUMIN 2.1* 11/06/2013 0505   AST 32 11/05/2013 0455   ALT 22 11/05/2013 0455   ALKPHOS 81 11/05/2013 0455   BILITOT 0.5 11/05/2013 0455   GFRNONAA 17* 11/06/2013 0505   GFRAA 20* 11/06/2013 0505    Assessment and Plan  Hypoglycemia -Recurrent and Severe  -Likely due to not eating and taking Insulin.  -No signs of infection  -CKD could be playing into patient's hypoglycemic episodes as lantus is not being metabolized out.  -resolved with supportive care   DM (diabetes mellitus), type 2 with renal complications Will discharge again on decreased dose of lantus (12 units) and SSI - Sensitive.Will need to be monitored closely while at SNF.   Metabolic acidosis Appears to have resolved - follow trend  -Continue on BID bicarb tablets   Accidental hypothermia Resolved - likely due to extreme hypoglcemia    Stage 5 chronic kidney disease due to type 2 diabetes  mellitus -Worsened by not eating, dehydration, and reportedly vomiting.  -baseline creatinine 09/12/2013 was 4.14  -Resolved with gentle IV fluids with bicarb  -Has left AVF in place w/ thrill palpable  -no acute indication for HD    Acute kidney injury -Worsened by not eating, dehydration, and reportedly vomiting.  -baseline creatinine 09/12/2013 was 4.14  -Resolved with gentle IV fluids with bicarb  -Has left AVF in place w/ thrill palpable  -no acute indication for HD    Hypertension poorly controlled - c/w amlodipine and coreg   Stroke continue aspirin 81 mg  -LDL 42 - reported hx of rhabdo believed to have been related to lipitor  -Restarted statin.  monitor LFTs   DVT (deep venous thrombosis) RUE;diagnosed 10/29/2013 - unclear if related to catheter/device or not  - INR subtherapeutic-as a result will be discharged on oral pain Lovenox/Coumadin-stop Lovenox once INR more than 2   Substance abuse -UDS positive for marijuana  -Counseled to avoid marijuana and alcohol     Margit Hanks, MD

## 2013-11-13 ENCOUNTER — Encounter: Payer: Self-pay | Admitting: Internal Medicine

## 2013-11-13 DIAGNOSIS — N179 Acute kidney failure, unspecified: Secondary | ICD-10-CM | POA: Insufficient documentation

## 2013-11-13 DIAGNOSIS — T68XXXA Hypothermia, initial encounter: Secondary | ICD-10-CM | POA: Insufficient documentation

## 2013-11-13 DIAGNOSIS — N185 Chronic kidney disease, stage 5: Secondary | ICD-10-CM

## 2013-11-13 DIAGNOSIS — E1122 Type 2 diabetes mellitus with diabetic chronic kidney disease: Secondary | ICD-10-CM | POA: Insufficient documentation

## 2013-11-13 NOTE — Assessment & Plan Note (Signed)
-  Recurrent and Severe  -Likely due to not eating and taking Insulin.  -No signs of infection  -CKD could be playing into patient's hypoglycemic episodes as lantus is not being metabolized out.  -resolved with supportive care

## 2013-11-13 NOTE — Assessment & Plan Note (Signed)
RUE;diagnosed 10/29/2013 - unclear if related to catheter/device or not  - INR subtherapeutic-as a result will be discharged on oral pain Lovenox/Coumadin-stop Lovenox once INR more than 2

## 2013-11-13 NOTE — Assessment & Plan Note (Signed)
-  Worsened by not eating, dehydration, and reportedly vomiting.  -baseline creatinine 09/12/2013 was 4.14  -Resolved with gentle IV fluids with bicarb  -Has left AVF in place w/ thrill palpable  -no acute indication for HD   

## 2013-11-13 NOTE — Assessment & Plan Note (Signed)
-  UDS positive for marijuana  -Counseled to avoid marijuana and alcohol

## 2013-11-13 NOTE — Assessment & Plan Note (Signed)
poorly controlled - c/w amlodipine and coreg

## 2013-11-13 NOTE — Assessment & Plan Note (Signed)
Will discharge again on decreased dose of lantus (12 units) and SSI - Sensitive.Will need to be monitored closely while at SNF.

## 2013-11-13 NOTE — Assessment & Plan Note (Signed)
Appears to have resolved - follow trend  -Continue on BID bicarb tablets

## 2013-11-13 NOTE — Assessment & Plan Note (Signed)
Resolved - likely due to extreme hypoglcemia

## 2013-11-13 NOTE — Assessment & Plan Note (Signed)
-  Worsened by not eating, dehydration, and reportedly vomiting.  -baseline creatinine 09/12/2013 was 4.14  -Resolved with gentle IV fluids with bicarb  -Has left AVF in place w/ thrill palpable  -no acute indication for HD

## 2013-11-13 NOTE — Assessment & Plan Note (Signed)
continue aspirin 81 mg  -LDL 42 - reported hx of rhabdo believed to have been related to lipitor  -Restarted statin.  monitor LFTs

## 2013-11-21 ENCOUNTER — Encounter: Payer: Self-pay | Admitting: Nurse Practitioner

## 2013-11-21 NOTE — Progress Notes (Signed)
This encounter was created in error - please disregard.

## 2013-11-24 ENCOUNTER — Non-Acute Institutional Stay (SKILLED_NURSING_FACILITY): Payer: Medicare Other | Admitting: Internal Medicine

## 2013-11-24 DIAGNOSIS — E1122 Type 2 diabetes mellitus with diabetic chronic kidney disease: Secondary | ICD-10-CM

## 2013-11-24 DIAGNOSIS — F1910 Other psychoactive substance abuse, uncomplicated: Secondary | ICD-10-CM

## 2013-11-24 DIAGNOSIS — N189 Chronic kidney disease, unspecified: Secondary | ICD-10-CM

## 2013-11-24 NOTE — Progress Notes (Signed)
MRN: [id number] Name: [name] [name]  Sex: male Age: 58 y.o. DOB: October 30, 1955  PSC #: [name] [name] Facility/Room: 112 Level Of Care: SNF Provider: Merrilee SeashoreALEXANDER, ANNE [name] Emergency Contacts: Extended Emergency Contact Information Primary Emergency Contact: [name] [name] Address: [address]          [place], [place] [id number] [place] Mobile Phone: [phone] Relation: Sister  Code Status: FULL  Allergies: Pork-derived products  Chief Complaint  Patient presents with  . Medical Management of Chronic Issues    HPI: Patient is 58 y.o. male who is being seen because his BS have been running high.  Past Medical History  Diagnosis Date  . Hypertension   . CHF (congestive heart failure)   . Seizures   . Tuberculosis 1960's    "got the tx for it" (10/27/2013)  . Type II diabetes mellitus   . Hepatitis C   . Stroke 06/2013    "LLE paralyzed since" (10/27/2013)  . Arthritis     "left knee hurts" (10/27/2013)  . Chronic kidney disease (CKD), stage IV (severe)   . Hypoglycemia 11/03/2013    Past Surgical History  Procedure Laterality Date  . Av fistula placement Left 09/11/2013    Procedure: Insertion of arteriovenous gortex graft;  Surgeon: [name] Hinthristopher S Dickson, MD;  [facility];  Service: Vascular;  Laterality: Left;  . Laceration repair Left ?2014    "cut my leg falling off a truck"  . Loop recorder implant  05/2012      Medication List       This list is accurate as of: 11/24/13 11:59 PM.  Always use your most recent med list.               acetaminophen 325 MG tablet  Commonly known as:  TYLENOL  Take 2 tablets (650 mg total) by mouth every 6 (six) hours as needed for mild pain (or Fever >/= 101).     amLODipine 10 MG tablet  Commonly known as:  NORVASC  Take 10 mg by mouth daily.     aspirin 81 MG tablet  Take 81 mg by mouth daily.     atorvastatin 20 MG tablet  Commonly known as:  LIPITOR  Take 1 tablet (20 mg total) by mouth daily  at 6 PM.     calcitRIOL 0.25 MCG capsule  Commonly known as:  ROCALTROL  Take 1 capsule (0.25 mcg total) by mouth daily.     calcium acetate 667 MG capsule  Commonly known as:  PHOSLO  Take 667 mg by mouth 3 (three) times daily with meals.     carvedilol 12.5 MG tablet  Commonly known as:  COREG  Take 1 tablet (12.5 mg total) by mouth 2 (two) times daily with a meal.     enoxaparin 60 MG/0.6ML injection  Commonly known as:  LOVENOX  Inject 0.5 mLs (50 mg total) into the skin daily.     furosemide 80 MG tablet  Commonly known as:  LASIX  Take 1 tablet (80 mg total) by mouth daily.     glucose blood test strip  Use as instructed     insulin aspart 100 UNIT/ML injection  Commonly known as:  novoLOG  Inject 0-9 Units into the skin 3 (three) times daily with meals.     insulin glargine 100 UNIT/ML injection  Commonly known as:  LANTUS  Inject 0.12 mLs (12 Units total) into the skin at bedtime.     sodium bicarbonate 650 MG tablet  Take 2 tablets (1,300 mg total) by mouth 2 (two) times daily.     thiamine 100 MG tablet  Commonly known as:  VITAMIN B-1  Take 100 mg by mouth every morning.     warfarin 3 MG tablet  Commonly known as:  COUMADIN  Take 1 tablet (3 mg total) by mouth daily at 6 PM.        No orders of the defined types were placed in this encounter.    Immunization History  Administered Date(s) Administered  . Influenza-Unspecified 10/14/2013  . Pneumococcal Polysaccharide-23 09/11/2013    History  Substance Use Topics  . Smoking status: Current Every Day Smoker -- 0.25 packs/day for 42 years    Types: Cigarettes  . Smokeless tobacco: Never Used  . Alcohol Use: No    Review of Systems  DATA OBTAINED: from patient, nurse GENERAL:  no fevers, fatigue, appetite changes; non compliant SKIN: No itching, rash HEENT: No complaint RESPIRATORY: No cough, wheezing, SOB CARDIAC: No chest pain, palpitations, lower extremity edema  GI: No abdominal  pain, No N/V/[name] or constipation, No heartburn or reflux  GU: No dysuria, frequency or urgency, or incontinence  MUSCULOSKELETAL: No unrelieved bone/joint pain NEUROLOGIC: No headache, dizziness  PSYCHIATRIC: No overt anxiety or sadness  Filed Vitals:   11/24/13 2213  BP: 122/46  Pulse: 73  Temp: 97.3 F (36.3 C)  Resp: 18    Physical Exam  GENERAL APPEARANCE: Alert,mod  conversant, No acute distress; thin BM  SKIN: No diaphoresis rash, or wounds HEENT: Unremarkable RESPIRATORY: Breathing is even, unlabored. Lung sounds are clear   CARDIOVASCULAR: Heart RRR no murmurs, rubs or gallops. No peripheral edema  GASTROINTESTINAL: Abdomen is soft, non-tender, not distended w/ normal bowel sounds.  GENITOURINARY: Bladder non tender, not distended  MUSCULOSKELETAL: No abnormal joints or musculature NEUROLOGIC: Cranial nerves 2-12 grossly intact. Moves all extremities PSYCHIATRIC: Mood and affect appropriate to situation, no behavioral issues  Patient Active Problem List   Diagnosis Date Noted  . Non compliance with medical treatment 11/27/2013  . Accidental hypothermia 11/13/2013  . Stage 5 chronic kidney disease due to type 2 diabetes mellitus 11/13/2013  . Acute kidney injury 11/13/2013  . DVT (deep venous thrombosis) 10/30/2013  . Swelling of arm, right 10/29/2013  . DM (diabetes mellitus), type 2 with renal complications 10/28/2013  . Rhabdomyolysis 10/28/2013  . Hypoglycemia 10/27/2013  . Hyperkalemia 10/27/2013  . History of seizures 10/27/2013  . Metabolic acidosis 10/27/2013  . Substance abuse 10/27/2013  . End stage renal disease 10/01/2013  . Protein-calorie malnutrition, severe 09/12/2013  . DKA (diabetic ketoacidoses) 09/12/2013  . H/O: CVA (cerebrovascular accident) 09/09/2013  . Hypertension   . Chronic diastolic CHF (congestive heart failure)   . Stroke   . Seizures   . Hyperglycemia 09/08/2013  . Hyperglycemic crisis in diabetes mellitus 09/08/2013    CBC     Component Value Date/Time   WBC 5.6 11/06/2013 0505   RBC 3.16* 11/06/2013 0505   RBC 5.13 10/28/2013 0535   HGB 8.5* 11/06/2013 0505   HCT 25.2* 11/06/2013 0505   PLT 193 11/06/2013 0505   MCV 79.7 11/06/2013 0505   LYMPHSABS 1.5 11/05/2013 0455   MONOABS 0.4 11/05/2013 0455   EOSABS 0.1 11/05/2013 0455   BASOSABS 0.0 11/05/2013 0455    CMP     Component Value Date/Time   NA 136* 11/06/2013 0505   K 4.5 11/06/2013 0505   CL 102 11/06/2013 0505   CO2 21 11/06/2013 0505  GLUCOSE 230* 11/06/2013 0505   BUN 79* 11/06/2013 0505   CREATININE 3.61* 11/06/2013 0505   CALCIUM 8.2* 11/06/2013 0505   PROT 6.2 11/05/2013 0455   ALBUMIN 2.1* 11/06/2013 0505   AST 32 11/05/2013 0455   ALT 22 11/05/2013 0455   ALKPHOS 81 11/05/2013 0455   BILITOT 0.5 11/05/2013 0455   GFRNONAA 17* 11/06/2013 0505   GFRAA 20* 11/06/2013 0505    Assessment and Plan  DM (diabetes mellitus), type 2 with renal complications Pt is a very brittle DM and a non compliant pt; review of 7 days of CBG's show his BS are all over the place with no discernable pattern; long acting insulin can't be increased because he has a number of low sugars with the highs; compliance would be very helpful but pt is not motivated; SSI developed for this particular pt  Substance abuse Part of the problem    Margit HanksALEXANDER, ANNE D, MD

## 2013-11-24 NOTE — Assessment & Plan Note (Addendum)
Pt is a very brittle DM and a non compliant pt; review of 7 days of CBG's show his BS are all over the place with no discernable pattern; long acting insulin can't be increased because he has a number of low sugars with the highs; compliance would be very helpful but pt is not motivated; SSI developed for this particular pt

## 2013-11-27 ENCOUNTER — Non-Acute Institutional Stay (SKILLED_NURSING_FACILITY): Payer: Medicare Other | Admitting: Internal Medicine

## 2013-11-27 ENCOUNTER — Encounter: Payer: Self-pay | Admitting: Internal Medicine

## 2013-11-27 DIAGNOSIS — N189 Chronic kidney disease, unspecified: Secondary | ICD-10-CM

## 2013-11-27 DIAGNOSIS — E43 Unspecified severe protein-calorie malnutrition: Secondary | ICD-10-CM

## 2013-11-27 DIAGNOSIS — Z9119 Patient's noncompliance with other medical treatment and regimen: Secondary | ICD-10-CM

## 2013-11-27 DIAGNOSIS — Z87898 Personal history of other specified conditions: Secondary | ICD-10-CM

## 2013-11-27 DIAGNOSIS — I639 Cerebral infarction, unspecified: Secondary | ICD-10-CM

## 2013-11-27 DIAGNOSIS — N185 Chronic kidney disease, stage 5: Secondary | ICD-10-CM

## 2013-11-27 DIAGNOSIS — I82401 Acute embolism and thrombosis of unspecified deep veins of right lower extremity: Secondary | ICD-10-CM

## 2013-11-27 DIAGNOSIS — Z91199 Patient's noncompliance with other medical treatment and regimen due to unspecified reason: Secondary | ICD-10-CM | POA: Insufficient documentation

## 2013-11-27 DIAGNOSIS — I5032 Chronic diastolic (congestive) heart failure: Secondary | ICD-10-CM

## 2013-11-27 DIAGNOSIS — Z8669 Personal history of other diseases of the nervous system and sense organs: Secondary | ICD-10-CM

## 2013-11-27 DIAGNOSIS — F1910 Other psychoactive substance abuse, uncomplicated: Secondary | ICD-10-CM

## 2013-11-27 DIAGNOSIS — E1122 Type 2 diabetes mellitus with diabetic chronic kidney disease: Secondary | ICD-10-CM

## 2013-11-27 DIAGNOSIS — I10 Essential (primary) hypertension: Secondary | ICD-10-CM

## 2013-11-27 NOTE — Progress Notes (Signed)
MRN: [id number] Name: Elisabeth MostClarence Mihalic  Sex: male Age: 58 y.o. DOB: September 03, 1955  [facility] #: [name] Facility/Room: 112 Level Of Care: SNF Provider: Merrilee SeashoreALEXANDER, Saylee Sherrill D Emergency Contacts: Extended Emergency Contact Information Primary Emergency Contact: Ransome,Celeste Address: [address]          [place], [place] [id number] [place] Mobile Phone: [phone] Relation: Sister  Code Status: FULL  Allergies: Pork-derived products  Chief Complaint  Patient presents with  . Discharge Note    HPI: Patient is 58 y.o. male who was admitted to SNF after severe hypoglycemic episode from his brittle type 2 DM and newly dx RUE DVT who is stable for discharge to home.  Past Medical History  Diagnosis Date  . Hypertension   . CHF (congestive heart failure)   . Seizures   . Tuberculosis 1960's    "got the tx for it" (10/27/2013)  . Type II diabetes mellitus   . Hepatitis C   . Stroke 06/2013    "LLE paralyzed since" (10/27/2013)  . Arthritis     "left knee hurts" (10/27/2013)  . Chronic kidney disease (CKD), stage IV (severe)   . Hypoglycemia 11/03/2013    Past Surgical History  Procedure Laterality Date  . Av fistula placement Left 09/11/2013    Procedure: Insertion of arteriovenous gortex graft;  Surgeon: Chuck Hinthristopher S Dickson, MD;  [facility];  Service: Vascular;  Laterality: Left;  . Laceration repair Left ?2014    "cut my leg falling off a truck"  . Loop recorder implant  05/2012      Medication List       This list is accurate as of: 11/27/13  2:42 PM.  Always use your most recent med list.               acetaminophen 325 MG tablet  Commonly known as:  TYLENOL  Take 2 tablets (650 mg total) by mouth every 6 (six) hours as needed for mild pain (or Fever >/= 101).     amLODipine 10 MG tablet  Commonly known as:  NORVASC  Take 10 mg by mouth daily.     aspirin 81 MG tablet  Take 81 mg by mouth daily.     atorvastatin 20 MG tablet  Commonly  known as:  LIPITOR  Take 1 tablet (20 mg total) by mouth daily at 6 PM.     calcitRIOL 0.25 MCG capsule  Commonly known as:  ROCALTROL  Take 1 capsule (0.25 mcg total) by mouth daily.     calcium acetate 667 MG capsule  Commonly known as:  PHOSLO  Take 667 mg by mouth 3 (three) times daily with meals.     carvedilol 12.5 MG tablet  Commonly known as:  COREG  Take 1 tablet (12.5 mg total) by mouth 2 (two) times daily with a meal.     furosemide 80 MG tablet  Commonly known as:  LASIX  Take 1 tablet (80 mg total) by mouth daily.     glucose blood test strip  Use as instructed     insulin aspart 100 UNIT/ML injection  Commonly known as:  novoLOG  - Inject 0-12 Units into the skin 4 (four) times daily -  with meals and at bedtime. SSI  BS  150-200  2 u    To be given SQ  -                 201-250  4 u  -  251-300  6 u  -                 301-350  8 u  -                  351-400  10 u  -                   > 400  12 u and call MD     insulin glargine 100 UNIT/ML injection  Commonly known as:  LANTUS  Inject 0.12 mLs (12 Units total) into the skin at bedtime.     sodium bicarbonate 650 MG tablet  Take 2 tablets (1,300 mg total) by mouth 2 (two) times daily.     thiamine 100 MG tablet  Commonly known as:  VITAMIN B-1  Take 100 mg by mouth every morning.     warfarin 3 MG tablet  Commonly known as:  COUMADIN  Take 2 mg by mouth daily at 6 PM.        Meds ordered this encounter  Medications  . warfarin (COUMADIN) 3 MG tablet    Sig: Take 2 mg by mouth daily at 6 PM.  . insulin aspart (NOVOLOG) 100 UNIT/ML injection    Sig: Inject 0-12 Units into the skin 4 (four) times daily -  with meals and at bedtime. SSI  BS  150-200  2 u    To be given SQ                 201-250  4 u                 251-300  6 u                 301-350  8 u                  351-400  10 u                   > 400  12 u and call MD    Immunization History  Administered Date(s)  Administered  . Influenza-Unspecified 10/14/2013  . Pneumococcal Polysaccharide-23 09/11/2013    History  Substance Use Topics  . Smoking status: Current Every Day Smoker -- 0.25 packs/day for 42 years    Types: Cigarettes  . Smokeless tobacco: Never Used  . Alcohol Use: No    There were no vitals filed for this visit.  Physical Exam  GENERAL APPEARANCE: Alert, conversant. No acute distress.  HEENT: Unremarkable. RESPIRATORY: Breathing is even, unlabored. Lung sounds are clear   CARDIOVASCULAR: Heart RRR no murmurs, rubs or gallops. No peripheral edema.  GASTROINTESTINAL: Abdomen is soft, non-tender, not distended w/ normal bowel sounds.  NEUROLOGIC: Cranial nerves 2-12 grossly intact. Moves all extremities  Patient Active Problem List   Diagnosis Date Noted  . Non compliance with medical treatment 11/27/2013  . Accidental hypothermia 11/13/2013  . Stage 5 chronic kidney disease due to type 2 diabetes mellitus 11/13/2013  . Acute kidney injury 11/13/2013  . DVT (deep venous thrombosis) 10/30/2013  . Swelling of arm, right 10/29/2013  . DM (diabetes mellitus), type 2 with renal complications 10/28/2013  . Rhabdomyolysis 10/28/2013  . Hypoglycemia 10/27/2013  . Hyperkalemia 10/27/2013  . History of seizures 10/27/2013  . Metabolic acidosis 10/27/2013  . Substance abuse 10/27/2013  . End stage renal disease 10/01/2013  . Protein-calorie malnutrition, severe 09/12/2013  . DKA (diabetic ketoacidoses) 09/12/2013  .  H/O: CVA (cerebrovascular accident) 09/09/2013  . Hypertension   . Chronic diastolic CHF (congestive heart failure)   . Stroke   . Seizures   . Hyperglycemia 09/08/2013  . Hyperglycemic crisis in diabetes mellitus 09/08/2013    CBC    Component Value Date/Time   WBC 5.6 11/06/2013 0505   RBC 3.16* 11/06/2013 0505   RBC 5.13 10/28/2013 0535   HGB 8.5* 11/06/2013 0505   HCT 25.2* 11/06/2013 0505   PLT 193 11/06/2013 0505   MCV 79.7 11/06/2013 0505   LYMPHSABS  1.5 11/05/2013 0455   MONOABS 0.4 11/05/2013 0455   EOSABS 0.1 11/05/2013 0455   BASOSABS 0.0 11/05/2013 0455    CMP     Component Value Date/Time   NA 136* 11/06/2013 0505   K 4.5 11/06/2013 0505   CL 102 11/06/2013 0505   CO2 21 11/06/2013 0505   GLUCOSE 230* 11/06/2013 0505   BUN 79* 11/06/2013 0505   CREATININE 3.61* 11/06/2013 0505   CALCIUM 8.2* 11/06/2013 0505   PROT 6.2 11/05/2013 0455   ALBUMIN 2.1* 11/06/2013 0505   AST 32 11/05/2013 0455   ALT 22 11/05/2013 0455   ALKPHOS 81 11/05/2013 0455   BILITOT 0.5 11/05/2013 0455   GFRNONAA 17* 11/06/2013 0505   GFRAA 20* 11/06/2013 0505    Assessment and Plan  Pt is stable to be discharged to home. Pt's coumadin has been held for being too anticoagulated and will need to get his PT/INR checked tomorrow 10/30. Pt continues to be a brittle diabetic on a SSI scale. Pt is d/c with HH/OT/PT.  Margit HanksALEXANDER, Tiana Sivertson D, MD

## 2013-12-05 ENCOUNTER — Other Ambulatory Visit (HOSPITAL_COMMUNITY): Payer: Self-pay | Admitting: *Deleted

## 2013-12-06 ENCOUNTER — Encounter: Payer: Self-pay | Admitting: Internal Medicine

## 2013-12-06 NOTE — Assessment & Plan Note (Signed)
Part of the problem

## 2013-12-08 ENCOUNTER — Encounter (HOSPITAL_COMMUNITY)
Admission: RE | Admit: 2013-12-08 | Discharge: 2013-12-08 | Disposition: A | Discharge status: Home / Self Care | Payer: Medicare Other | Source: Ambulatory Visit | Attending: Nephrology | Admitting: Nephrology

## 2013-12-08 DIAGNOSIS — Z5181 Encounter for therapeutic drug level monitoring: Secondary | ICD-10-CM | POA: Diagnosis not present

## 2013-12-08 DIAGNOSIS — I120 Hypertensive chronic kidney disease with stage 5 chronic kidney disease or end stage renal disease: Secondary | ICD-10-CM | POA: Insufficient documentation

## 2013-12-08 DIAGNOSIS — D631 Anemia in chronic kidney disease: Secondary | ICD-10-CM | POA: Diagnosis not present

## 2013-12-08 DIAGNOSIS — E11649 Type 2 diabetes mellitus with hypoglycemia without coma: Secondary | ICD-10-CM | POA: Diagnosis not present

## 2013-12-08 DIAGNOSIS — Z7901 Long term (current) use of anticoagulants: Secondary | ICD-10-CM | POA: Diagnosis not present

## 2013-12-08 DIAGNOSIS — M1990 Unspecified osteoarthritis, unspecified site: Secondary | ICD-10-CM | POA: Insufficient documentation

## 2013-12-08 DIAGNOSIS — Z794 Long term (current) use of insulin: Secondary | ICD-10-CM | POA: Diagnosis not present

## 2013-12-08 DIAGNOSIS — N185 Chronic kidney disease, stage 5: Secondary | ICD-10-CM | POA: Insufficient documentation

## 2013-12-08 DIAGNOSIS — I5032 Chronic diastolic (congestive) heart failure: Secondary | ICD-10-CM | POA: Diagnosis not present

## 2013-12-08 LAB — POCT HEMOGLOBIN-HEMACUE: Hemoglobin: 8.5 g/dL — ABNORMAL LOW (ref 13.0–17.0)

## 2013-12-08 MED ORDER — EPOETIN ALFA 10000 UNIT/ML IJ SOLN
INTRAMUSCULAR | Status: AC
  Administered 2013-12-08: 10000 [IU] via SUBCUTANEOUS
  Filled 2013-12-08: qty 1

## 2013-12-08 MED ORDER — SODIUM CHLORIDE 0.9 % IV SOLN
1020.0000 mg | Freq: Once | INTRAVENOUS | Status: AC
  Administered 2013-12-08: 1020 mg via INTRAVENOUS
  Filled 2013-12-08: qty 34

## 2013-12-08 MED ORDER — EPOETIN ALFA 20000 UNIT/ML IJ SOLN
INTRAMUSCULAR | Status: AC
  Administered 2013-12-08: 20000 [IU] via SUBCUTANEOUS
  Filled 2013-12-08: qty 1

## 2013-12-08 MED ORDER — EPOETIN ALFA 40000 UNIT/ML IJ SOLN: 30000.0000 [IU] | INTRAMUSCULAR | Status: DC

## 2013-12-08 NOTE — Discharge Instructions (Signed)
Epoetin Alfa injection °What is this medicine? °EPOETIN ALFA (e POE e tin AL fa) helps your body make more red blood cells. This medicine is used to treat anemia caused by chronic kidney failure, cancer chemotherapy, or HIV-therapy. It may also be used before surgery if you have anemia. °This medicine may be used for other purposes; ask your health care provider or pharmacist if you have questions. °COMMON BRAND NAME(S): Epogen, Procrit °What should I tell my health care provider before I take this medicine? °They need to know if you have any of these conditions: °-blood clotting disorders °-cancer patient not on chemotherapy °-cystic fibrosis °-heart disease, such as angina or heart failure °-hemoglobin level of 12 g/dL or greater °-high blood pressure °-low levels of folate, iron, or vitamin B12 °-seizures °-an unusual or allergic reaction to erythropoietin, albumin, benzyl alcohol, hamster proteins, other medicines, foods, dyes, or preservatives °-pregnant or trying to get pregnant °-breast-feeding °How should I use this medicine? °This medicine is for injection into a vein or under the skin. It is usually given by a health care professional in a hospital or clinic setting. °If you get this medicine at home, you will be taught how to prepare and give this medicine. Use exactly as directed. Take your medicine at regular intervals. Do not take your medicine more often than directed. °It is important that you put your used needles and syringes in a special sharps container. Do not put them in a trash can. If you do not have a sharps container, call your pharmacist or healthcare provider to get one. °Talk to your pediatrician regarding the use of this medicine in children. While this drug may be prescribed for selected conditions, precautions do apply. °Overdosage: If you think you have taken too much of this medicine contact a poison control center or emergency room at once. °NOTE: This medicine is only for you. Do  not share this medicine with others. °What if I miss a dose? °If you miss a dose, take it as soon as you can. If it is almost time for your next dose, take only that dose. Do not take double or extra doses. °What may interact with this medicine? °Do not take this medicine with any of the following medications: °-darbepoetin alfa °This list may not describe all possible interactions. Give your health care provider a list of all the medicines, herbs, non-prescription drugs, or dietary supplements you use. Also tell them if you smoke, drink alcohol, or use illegal drugs. Some items may interact with your medicine. °What should I watch for while using this medicine? °Visit your prescriber or health care professional for regular checks on your progress and for the needed blood tests and blood pressure measurements. It is especially important for the doctor to make sure your hemoglobin level is in the desired range, to limit the risk of potential side effects and to give you the best benefit. Keep all appointments for any recommended tests. Check your blood pressure as directed. Ask your doctor what your blood pressure should be and when you should contact him or her. °As your body makes more red blood cells, you may need to take iron, folic acid, or vitamin B supplements. Ask your doctor or health care provider which products are right for you. If you have kidney disease continue dietary restrictions, even though this medication can make you feel better. Talk with your doctor or health care professional about the foods you eat and the vitamins that you take. °What   side effects may I notice from receiving this medicine? °Side effects that you should report to your doctor or health care professional as soon as possible: °-allergic reactions like skin rash, itching or hives, swelling of the face, lips, or tongue °-breathing problems °-changes in vision °-chest pain °-confusion, trouble speaking or understanding °-feeling  faint or lightheaded, falls °-high blood pressure °-muscle aches or pains °-pain, swelling, warmth in the leg °-rapid weight gain °-severe headaches °-sudden numbness or weakness of the face, arm or leg °-trouble walking, dizziness, loss of balance or coordination °-seizures (convulsions) °-swelling of the ankles, feet, hands °-unusually weak or tired °Side effects that usually do not require medical attention (report to your doctor or health care professional if they continue or are bothersome): °-diarrhea °-fever, chills (flu-like symptoms) °-headaches °-nausea, vomiting °-redness, stinging, or swelling at site where injected °This list may not describe all possible side effects. Call your doctor for medical advice about side effects. You may report side effects to FDA at 1-800-FDA-1088. °Where should I keep my medicine? °Keep out of the reach of children. °Store in a refrigerator between 2 and 8 degrees C (36 and 46 degrees F). Do not freeze or shake. Throw away any unused portion if using a single-dose vial. Multi-dose vials can be kept in the refrigerator for up to 21 days after the initial dose. Throw away unused medicine. °NOTE: This sheet is a summary. It may not cover all possible information. If you have questions about this medicine, talk to your doctor, pharmacist, or health care provider. °© 2015, [employer]. (2007-12-31 10:25:44) °Ferumoxytol injection °What is this medicine? °FERUMOXYTOL is an iron complex. Iron is used to make healthy red blood cells, which carry oxygen and nutrients throughout the body. This medicine is used to treat iron deficiency anemia in people with chronic kidney disease. °This medicine may be used for other purposes; ask your health care provider or pharmacist if you have questions. °COMMON BRAND NAME(S): Feraheme °What should I tell my health care provider before I take this medicine? °They need to know if you have any of these conditions: °-anemia not caused by  low iron levels °-high levels of iron in the blood °-magnetic resonance imaging (MRI) test scheduled °-an unusual or allergic reaction to iron, other medicines, foods, dyes, or preservatives °-pregnant or trying to get pregnant °-breast-feeding °How should I use this medicine? °This medicine is for injection into a vein. It is given by a health care professional in a hospital or clinic setting. °Talk to your pediatrician regarding the use of this medicine in children. Special care may be needed. °Overdosage: If you think you've taken too much of this medicine contact a poison control center or emergency room at once. °Overdosage: If you think you have taken too much of this medicine contact a poison control center or emergency room at once. °NOTE: This medicine is only for you. Do not share this medicine with others. °What if I miss a dose? °It is important not to miss your dose. Call your doctor or health care professional if you are unable to keep an appointment. °What may interact with this medicine? °This medicine may interact with the following medications: °-other iron products °This list may not describe all possible interactions. Give your health care provider a list of all the medicines, herbs, non-prescription drugs, or dietary supplements you use. Also tell them if you smoke, drink alcohol, or use illegal drugs. Some items may interact with your medicine. °What should I   watch for while using this medicine? °Visit your doctor or healthcare professional regularly. Tell your doctor or healthcare professional if your symptoms do not start to get better or if they get worse. You may need blood work done while you are taking this medicine. °You may need to follow a special diet. Talk to your doctor. Foods that contain iron include: whole grains/cereals, dried fruits, beans, or peas, leafy green vegetables, and organ meats (liver, kidney). °What side effects may I notice from receiving this medicine? °Side  effects that you should report to your doctor or health care professional as soon as possible: °-allergic reactions like skin rash, itching or hives, swelling of the face, lips, or tongue °-breathing problems °-changes in blood pressure °-feeling faint or lightheaded, falls °-fever or chills °-flushing, sweating, or hot feelings °-swelling of the ankles or feet °Side effects that usually do not require medical attention (Report these to your doctor or health care professional if they continue or are bothersome.): °-diarrhea °-headache °-nausea, vomiting °-stomach pain °This list may not describe all possible side effects. Call your doctor for medical advice about side effects. You may report side effects to FDA at 1-800-FDA-1088. °Where should I keep my medicine? °This drug is given in a hospital or clinic and will not be stored at home. °NOTE: This sheet is a summary. It may not cover all possible information. If you have questions about this medicine, talk to your doctor, pharmacist, or health care provider. °© 2015, [employer] Standard. (2011-09-01 15:23:36) ° ° °

## 2013-12-18 ENCOUNTER — Other Ambulatory Visit: Payer: Self-pay | Admitting: Internal Medicine

## 2013-12-22 ENCOUNTER — Inpatient Hospital Stay (HOSPITAL_COMMUNITY): Admission: RE | Admit: 2013-12-22 | Payer: Medicare Other | Source: Ambulatory Visit

## 2013-12-22 ENCOUNTER — Inpatient Hospital Stay (HOSPITAL_COMMUNITY)
Admission: EM | Admit: 2013-12-22 | Discharge: 2013-12-27 | DRG: 371 | Disposition: A | Discharge status: Home / Self Care | Payer: Medicare Other | Attending: Internal Medicine | Admitting: Internal Medicine

## 2013-12-22 ENCOUNTER — Encounter (HOSPITAL_COMMUNITY): Payer: Self-pay | Admitting: Emergency Medicine

## 2013-12-22 DIAGNOSIS — E162 Hypoglycemia, unspecified: Secondary | ICD-10-CM | POA: Diagnosis present

## 2013-12-22 DIAGNOSIS — Z681 Body mass index (BMI) 19 or less, adult: Secondary | ICD-10-CM

## 2013-12-22 DIAGNOSIS — R197 Diarrhea, unspecified: Secondary | ICD-10-CM

## 2013-12-22 DIAGNOSIS — Z86718 Personal history of other venous thrombosis and embolism: Secondary | ICD-10-CM | POA: Diagnosis not present

## 2013-12-22 DIAGNOSIS — R55 Syncope and collapse: Secondary | ICD-10-CM | POA: Diagnosis present

## 2013-12-22 DIAGNOSIS — Z7982 Long term (current) use of aspirin: Secondary | ICD-10-CM | POA: Diagnosis not present

## 2013-12-22 DIAGNOSIS — A047 Enterocolitis due to Clostridium difficile: Secondary | ICD-10-CM | POA: Diagnosis present

## 2013-12-22 DIAGNOSIS — F17210 Nicotine dependence, cigarettes, uncomplicated: Secondary | ICD-10-CM | POA: Diagnosis present

## 2013-12-22 DIAGNOSIS — Z8673 Personal history of transient ischemic attack (TIA), and cerebral infarction without residual deficits: Secondary | ICD-10-CM

## 2013-12-22 DIAGNOSIS — M1990 Unspecified osteoarthritis, unspecified site: Secondary | ICD-10-CM | POA: Diagnosis present

## 2013-12-22 DIAGNOSIS — E43 Unspecified severe protein-calorie malnutrition: Secondary | ICD-10-CM | POA: Diagnosis present

## 2013-12-22 DIAGNOSIS — N185 Chronic kidney disease, stage 5: Secondary | ICD-10-CM | POA: Diagnosis present

## 2013-12-22 DIAGNOSIS — I5032 Chronic diastolic (congestive) heart failure: Secondary | ICD-10-CM | POA: Diagnosis present

## 2013-12-22 DIAGNOSIS — D649 Anemia, unspecified: Secondary | ICD-10-CM

## 2013-12-22 DIAGNOSIS — Z794 Long term (current) use of insulin: Secondary | ICD-10-CM

## 2013-12-22 DIAGNOSIS — G9340 Encephalopathy, unspecified: Secondary | ICD-10-CM | POA: Diagnosis present

## 2013-12-22 DIAGNOSIS — E1122 Type 2 diabetes mellitus with diabetic chronic kidney disease: Secondary | ICD-10-CM | POA: Diagnosis present

## 2013-12-22 DIAGNOSIS — Z9119 Patient's noncompliance with other medical treatment and regimen: Secondary | ICD-10-CM | POA: Diagnosis present

## 2013-12-22 DIAGNOSIS — E1129 Type 2 diabetes mellitus with other diabetic kidney complication: Secondary | ICD-10-CM | POA: Diagnosis present

## 2013-12-22 DIAGNOSIS — D689 Coagulation defect, unspecified: Secondary | ICD-10-CM | POA: Diagnosis present

## 2013-12-22 DIAGNOSIS — D631 Anemia in chronic kidney disease: Secondary | ICD-10-CM | POA: Diagnosis present

## 2013-12-22 DIAGNOSIS — N189 Chronic kidney disease, unspecified: Secondary | ICD-10-CM | POA: Insufficient documentation

## 2013-12-22 DIAGNOSIS — Z8611 Personal history of tuberculosis: Secondary | ICD-10-CM | POA: Diagnosis not present

## 2013-12-22 DIAGNOSIS — E11649 Type 2 diabetes mellitus with hypoglycemia without coma: Secondary | ICD-10-CM | POA: Diagnosis present

## 2013-12-22 DIAGNOSIS — Z7901 Long term (current) use of anticoagulants: Secondary | ICD-10-CM

## 2013-12-22 DIAGNOSIS — I82409 Acute embolism and thrombosis of unspecified deep veins of unspecified lower extremity: Secondary | ICD-10-CM | POA: Diagnosis present

## 2013-12-22 DIAGNOSIS — R404 Transient alteration of awareness: Secondary | ICD-10-CM

## 2013-12-22 DIAGNOSIS — Z79899 Other long term (current) drug therapy: Secondary | ICD-10-CM | POA: Diagnosis not present

## 2013-12-22 DIAGNOSIS — R4189 Other symptoms and signs involving cognitive functions and awareness: Secondary | ICD-10-CM | POA: Diagnosis present

## 2013-12-22 DIAGNOSIS — I129 Hypertensive chronic kidney disease with stage 1 through stage 4 chronic kidney disease, or unspecified chronic kidney disease: Secondary | ICD-10-CM | POA: Diagnosis present

## 2013-12-22 HISTORY — DX: Patient's other noncompliance with medication regimen for other reason: Z91.148

## 2013-12-22 HISTORY — DX: Acute embolism and thrombosis of unspecified deep veins of unspecified lower extremity: I82.409

## 2013-12-22 HISTORY — DX: Patient's other noncompliance with medication regimen: Z91.14

## 2013-12-22 HISTORY — DX: Type 1 diabetes mellitus without complications: E10.9

## 2013-12-22 LAB — COMPREHENSIVE METABOLIC PANEL
ALBUMIN: 2 g/dL — AB (ref 3.5–5.2)
ALK PHOS: 74 U/L (ref 39–117)
ALT: 6 U/L (ref 0–53)
ANION GAP: 14 (ref 5–15)
AST: 15 U/L (ref 0–37)
BUN: 69 mg/dL — ABNORMAL HIGH (ref 6–23)
CO2: 24 mEq/L (ref 19–32)
CREATININE: 3.7 mg/dL — AB (ref 0.50–1.35)
Calcium: 8.1 mg/dL — ABNORMAL LOW (ref 8.4–10.5)
Chloride: 104 mEq/L (ref 96–112)
GFR calc Af Amer: 19 mL/min — ABNORMAL LOW (ref >90)
GFR calc non Af Amer: 17 mL/min — ABNORMAL LOW (ref >90)
Glucose, Bld: 126 mg/dL — ABNORMAL HIGH (ref 70–99)
POTASSIUM: 3.8 meq/L (ref 3.7–5.3)
Sodium: 142 mEq/L (ref 137–147)
TOTAL PROTEIN: 7 g/dL (ref 6.0–8.3)
Total Bilirubin: 0.3 mg/dL (ref 0.3–1.2)

## 2013-12-22 LAB — URINALYSIS, ROUTINE W REFLEX MICROSCOPIC
Bilirubin Urine: NEGATIVE
GLUCOSE, UA: NEGATIVE mg/dL
Ketones, ur: NEGATIVE mg/dL
Leukocytes, UA: NEGATIVE
Nitrite: NEGATIVE
PH: 6.5 (ref 5.0–8.0)
Protein, ur: 100 mg/dL — AB
Specific Gravity, Urine: 1.015 (ref 1.005–1.030)
Urobilinogen, UA: 0.2 mg/dL (ref 0.0–1.0)

## 2013-12-22 LAB — CBC WITH DIFFERENTIAL/PLATELET
BASOS PCT: 0 % (ref 0–1)
Basophils Absolute: 0 10*3/uL (ref 0.0–0.1)
EOS ABS: 0.1 10*3/uL (ref 0.0–0.7)
Eosinophils Relative: 1 % (ref 0–5)
HCT: 23.9 % — ABNORMAL LOW (ref 39.0–52.0)
HEMOGLOBIN: 7.5 g/dL — AB (ref 13.0–17.0)
Lymphocytes Relative: 28 % (ref 12–46)
Lymphs Abs: 1.4 10*3/uL (ref 0.7–4.0)
MCH: 28.1 pg (ref 26.0–34.0)
MCHC: 31.4 g/dL (ref 30.0–36.0)
MCV: 89.5 fL (ref 78.0–100.0)
Monocytes Absolute: 0.3 10*3/uL (ref 0.1–1.0)
Monocytes Relative: 6 % (ref 3–12)
NEUTROS PCT: 65 % (ref 43–77)
Neutro Abs: 3.2 10*3/uL (ref 1.7–7.7)
Platelets: 267 10*3/uL (ref 150–400)
RBC: 2.67 MIL/uL — ABNORMAL LOW (ref 4.22–5.81)
RDW: 15.2 % (ref 11.5–15.5)
WBC: 4.9 10*3/uL (ref 4.0–10.5)

## 2013-12-22 LAB — GLUCOSE, CAPILLARY
GLUCOSE-CAPILLARY: 123 mg/dL — AB (ref 70–99)
GLUCOSE-CAPILLARY: 324 mg/dL — AB (ref 70–99)
Glucose-Capillary: 212 mg/dL — ABNORMAL HIGH (ref 70–99)

## 2013-12-22 LAB — PROTIME-INR
INR: 1.14 (ref 0.00–1.49)
PROTHROMBIN TIME: 14.8 s (ref 11.6–15.2)

## 2013-12-22 LAB — URINE MICROSCOPIC-ADD ON

## 2013-12-22 LAB — PREPARE RBC (CROSSMATCH)

## 2013-12-22 LAB — CBG MONITORING, ED
Glucose-Capillary: 76 mg/dL (ref 70–99)
Glucose-Capillary: 97 mg/dL (ref 70–99)

## 2013-12-22 LAB — TROPONIN I

## 2013-12-22 LAB — OCCULT BLOOD X 1 CARD TO LAB, STOOL: Fecal Occult Bld: POSITIVE — AB

## 2013-12-22 LAB — LACTIC ACID, PLASMA: Lactic Acid, Venous: 1.6 mmol/L (ref 0.5–2.2)

## 2013-12-22 MED ORDER — AMLODIPINE BESYLATE 10 MG PO TABS
10.0000 mg | ORAL_TABLET | Freq: Every day | ORAL | Status: DC
  Administered 2013-12-22 – 2013-12-27 (×6): 10 mg via ORAL
  Filled 2013-12-22 (×6): qty 1

## 2013-12-22 MED ORDER — ACETAMINOPHEN 650 MG RE SUPP: 650.0000 mg | Freq: Four times a day (QID) | RECTAL | Status: DC | PRN

## 2013-12-22 MED ORDER — OXYCODONE HCL 5 MG PO TABS
5.0000 mg | ORAL_TABLET | ORAL | Status: DC | PRN
  Administered 2013-12-22 – 2013-12-26 (×6): 5 mg via ORAL
  Filled 2013-12-22 (×6): qty 1

## 2013-12-22 MED ORDER — ATORVASTATIN CALCIUM 20 MG PO TABS
20.0000 mg | ORAL_TABLET | Freq: Every day | ORAL | Status: DC
  Administered 2013-12-22 – 2013-12-26 (×5): 20 mg via ORAL
  Filled 2013-12-22 (×6): qty 1

## 2013-12-22 MED ORDER — MORPHINE SULFATE 2 MG/ML IJ SOLN
2.0000 mg | INTRAMUSCULAR | Status: DC | PRN
  Administered 2013-12-22: 2 mg via INTRAVENOUS
  Filled 2013-12-22: qty 1

## 2013-12-22 MED ORDER — SODIUM CHLORIDE 0.9 % IV SOLN: 10.0000 mL/h | Freq: Once | INTRAVENOUS | Status: DC

## 2013-12-22 MED ORDER — PANTOPRAZOLE SODIUM 40 MG PO TBEC
40.0000 mg | DELAYED_RELEASE_TABLET | Freq: Every day | ORAL | Status: DC
  Administered 2013-12-22 – 2013-12-27 (×6): 40 mg via ORAL
  Filled 2013-12-22 (×4): qty 1

## 2013-12-22 MED ORDER — WARFARIN - PHARMACIST DOSING INPATIENT
Freq: Every day | Status: DC
  Administered 2013-12-25 – 2013-12-26 (×2)

## 2013-12-22 MED ORDER — ALUM & MAG HYDROXIDE-SIMETH 200-200-20 MG/5ML PO SUSP: 30.0000 mL | Freq: Four times a day (QID) | ORAL | Status: DC | PRN

## 2013-12-22 MED ORDER — FUROSEMIDE 80 MG PO TABS
80.0000 mg | ORAL_TABLET | Freq: Every day | ORAL | Status: DC
  Administered 2013-12-23 – 2013-12-27 (×5): 80 mg via ORAL
  Filled 2013-12-22 (×5): qty 1

## 2013-12-22 MED ORDER — CALCITRIOL 0.25 MCG PO CAPS
0.2500 ug | ORAL_CAPSULE | Freq: Every day | ORAL | Status: DC
  Administered 2013-12-23 – 2013-12-27 (×5): 0.25 ug via ORAL
  Filled 2013-12-22 (×6): qty 1

## 2013-12-22 MED ORDER — ONDANSETRON HCL 4 MG/2ML IJ SOLN: 4.0000 mg | Freq: Four times a day (QID) | INTRAMUSCULAR | Status: DC | PRN

## 2013-12-22 MED ORDER — GLUCOSE 40 % PO GEL
1.0000 | Freq: Once | ORAL | Status: AC
  Administered 2013-12-22: 37.5 g via ORAL
  Filled 2013-12-22: qty 1

## 2013-12-22 MED ORDER — HYDRALAZINE HCL 20 MG/ML IJ SOLN
10.0000 mg | INTRAMUSCULAR | Status: DC | PRN
  Administered 2013-12-22 – 2013-12-25 (×2): 10 mg via INTRAVENOUS
  Filled 2013-12-22 (×2): qty 1

## 2013-12-22 MED ORDER — CARVEDILOL 12.5 MG PO TABS
12.5000 mg | ORAL_TABLET | Freq: Two times a day (BID) | ORAL | Status: DC
  Administered 2013-12-22 – 2013-12-25 (×6): 12.5 mg via ORAL
  Filled 2013-12-22 (×8): qty 1

## 2013-12-22 MED ORDER — ISOSORBIDE MONONITRATE ER 60 MG PO TB24
60.0000 mg | ORAL_TABLET | Freq: Every day | ORAL | Status: DC
  Administered 2013-12-22 – 2013-12-27 (×6): 60 mg via ORAL
  Filled 2013-12-22 (×6): qty 1

## 2013-12-22 MED ORDER — SODIUM CHLORIDE 0.9 % IV SOLN: 250.0000 mL | INTRAVENOUS | Status: DC | PRN

## 2013-12-22 MED ORDER — ASPIRIN EC 81 MG PO TBEC
81.0000 mg | DELAYED_RELEASE_TABLET | Freq: Every day | ORAL | Status: DC
Start: 2013-12-23 — End: 2013-12-27
  Administered 2013-12-23 – 2013-12-27 (×5): 81 mg via ORAL
  Filled 2013-12-22 (×5): qty 1

## 2013-12-22 MED ORDER — SODIUM BICARBONATE 650 MG PO TABS
1300.0000 mg | ORAL_TABLET | Freq: Two times a day (BID) | ORAL | Status: DC
  Administered 2013-12-22 – 2013-12-27 (×10): 1300 mg via ORAL
  Filled 2013-12-22 (×11): qty 2

## 2013-12-22 MED ORDER — ONDANSETRON HCL 4 MG PO TABS: 4.0000 mg | ORAL_TABLET | Freq: Four times a day (QID) | ORAL | Status: DC | PRN

## 2013-12-22 MED ORDER — ACETAMINOPHEN 325 MG PO TABS: 650.0000 mg | ORAL_TABLET | Freq: Four times a day (QID) | ORAL | Status: DC | PRN

## 2013-12-22 MED ORDER — INSULIN ASPART 100 UNIT/ML ~~LOC~~ SOLN: 0.0000 [IU] | Freq: Every day | SUBCUTANEOUS | Status: DC

## 2013-12-22 MED ORDER — WARFARIN SODIUM 4 MG PO TABS
4.0000 mg | ORAL_TABLET | Freq: Once | ORAL | Status: AC
  Administered 2013-12-22: 4 mg via ORAL
  Filled 2013-12-22: qty 1

## 2013-12-22 MED ORDER — SODIUM CHLORIDE 0.9 % IV SOLN: Freq: Once | INTRAVENOUS | Status: DC

## 2013-12-22 MED ORDER — SODIUM CHLORIDE 0.9 % IJ SOLN: 3.0000 mL | INTRAMUSCULAR | Status: DC | PRN

## 2013-12-22 MED ORDER — ASPIRIN 81 MG PO TABS
81.0000 mg | ORAL_TABLET | Freq: Every day | ORAL | Status: DC
Start: 2013-12-22 — End: 2013-12-22

## 2013-12-22 MED ORDER — SODIUM CHLORIDE 0.9 % IJ SOLN
3.0000 mL | Freq: Two times a day (BID) | INTRAMUSCULAR | Status: DC
  Administered 2013-12-25 – 2013-12-26 (×2): 3 mL via INTRAVENOUS

## 2013-12-22 MED ORDER — SODIUM CHLORIDE 0.9 % IJ SOLN
3.0000 mL | Freq: Two times a day (BID) | INTRAMUSCULAR | Status: DC
  Administered 2013-12-22 – 2013-12-27 (×9): 3 mL via INTRAVENOUS

## 2013-12-22 MED ORDER — CALCIUM ACETATE 667 MG PO CAPS
667.0000 mg | ORAL_CAPSULE | Freq: Three times a day (TID) | ORAL | Status: DC
  Administered 2013-12-22 – 2013-12-27 (×15): 667 mg via ORAL
  Filled 2013-12-22 (×17): qty 1

## 2013-12-22 MED ORDER — ENOXAPARIN SODIUM 60 MG/0.6ML ~~LOC~~ SOLN
50.0000 mg | SUBCUTANEOUS | Status: DC
  Administered 2013-12-22 – 2013-12-27 (×6): 50 mg via SUBCUTANEOUS
  Filled 2013-12-22 (×7): qty 0.6

## 2013-12-22 NOTE — Progress Notes (Signed)
ANTICOAGULATION CONSULT NOTE - Initial Consult  Pharmacy Consult for Lovenox and Coumadin Indication: Recent DVT (Sept 2015)  Allergies  Allergen Reactions  . Pork-Derived Conservation officer, natureroducts Hives and Other (See Comments)    The patient has tolerated lovenox    Patient Measurements: Height: 5\' 7"  (170.2 cm) Weight: 105 lb 6.1 oz (47.8 kg) IBW/kg (Calculated) : 66.1  Vital Signs: Temp: 97.6 F (36.4 C) (11/23 1219) Temp Source: Oral (11/23 1219) BP: 188/90 mmHg (11/23 1219) Pulse Rate: 75 (11/23 1219)  Labs:  Recent Labs  12/22/13 0915  HGB 7.5*  HCT 23.9*  PLT 267  LABPROT 14.8  INR 1.14  CREATININE 3.70*  TROPONINI <0.30    Estimated Creatinine Clearance: 14.9 mL/min (by C-G formula based on Cr of 3.7).   Medical History: Past Medical History  Diagnosis Date  . Hypertension   . CHF (congestive heart failure)   . Seizures   . Tuberculosis 1960's    "got the tx for it" (10/27/2013)  . Type II diabetes mellitus   . Hepatitis C   . Stroke 06/2013    "LLE paralyzed since" (10/27/2013)  . Arthritis     "left knee hurts" (10/27/2013)  . Chronic kidney disease (CKD), stage IV (severe)   . Hypoglycemia 11/03/2013  . Brittle diabetes   . DVT (deep venous thrombosis)   . Hx of medication noncompliance     Medications:  Prescriptions prior to admission  Medication Sig Dispense Refill Last Dose  . amLODipine (NORVASC) 10 MG tablet Take 10 mg by mouth daily.   Taking  . aspirin 81 MG tablet Take 81 mg by mouth daily.   Taking  . atorvastatin (LIPITOR) 20 MG tablet Take 1 tablet (20 mg total) by mouth daily at 6 PM. 30 tablet 0 Taking  . acetaminophen (TYLENOL) 325 MG tablet Take 2 tablets (650 mg total) by mouth every 6 (six) hours as needed for mild pain (or Fever >/= 101).   Taking  . calcitRIOL (ROCALTROL) 0.25 MCG capsule Take 1 capsule (0.25 mcg total) by mouth daily. 30 capsule 0 Taking  . calcium acetate (PHOSLO) 667 MG capsule Take 667 mg by mouth 3 (three) times  daily with meals.   Taking  . carvedilol (COREG) 12.5 MG tablet Take 1 tablet (12.5 mg total) by mouth 2 (two) times daily with a meal.   Taking  . furosemide (LASIX) 80 MG tablet Take 1 tablet (80 mg total) by mouth daily. 30 tablet 0 Taking  . glucose blood test strip Use as instructed 100 each 12 Taking  . insulin aspart (NOVOLOG) 100 UNIT/ML injection Inject 0-12 Units into the skin 4 (four) times daily -  with meals and at bedtime. SSI  BS  150-200  2 u    To be given SQ                 201-250  4 u                 251-300  6 u                 301-350  8 u                  351-400  10 u                   > 400  12 u and call MD   Taking  . insulin glargine (LANTUS) 100 UNIT/ML injection Inject 0.12 mLs (12  Units total) into the skin at bedtime. 10 mL 11 Taking  . sodium bicarbonate 650 MG tablet Take 2 tablets (1,300 mg total) by mouth 2 (two) times daily. 120 tablet 1 Taking  . thiamine (VITAMIN B-1) 100 MG tablet Take 100 mg by mouth every morning.   Taking  . warfarin (COUMADIN) 3 MG tablet Take 2 mg by mouth daily at 6 PM.   Taking    Assessment: 58 y.o. male presents after being found unresponsive by family with CBG 38. On coumadin PTA for recent DVT in September. INR 1.14 - subtherapeutic at admit. Pt home dose thought to be 2mg  daily - pt states that he is taking but ?compliance with low INR. Per pt, sister takes care of his medications - will f/u with her when she arrives. Pt with allergy (hives) to pork products - "any and all pork products." He stated that at the last admission about a month ago, they tried to give him heparin and he started refusing it as he found out that it is derived from pork. He is ok with Lovenox shots as that is what he received the last time he was here and didn't have issues with bleeding or hives (even though it is pork-derived product too). SCr 3.7, est CrCl 15 ml/min (last admit SCr was about the same).  Goal of Therapy:  INR 2-3; anti-Xa level 0.6-1  units/ml (4 hours post Lovenox dose) Monitor platelets by anticoagulation protocol: Yes   Plan:  1. Lovenox 50mg  SQ q24h 2. Coumadin 4 mg po tonight 3. Daily INR 4. CBC q72h while on Lovenox 5. Will consider checking anti-Xa level at [facility]  Christoper Fabianaron Sona Nations, PharmD, BCPS Clinical pharmacist, pager [phone] 12/22/2013,12:52 PM

## 2013-12-22 NOTE — ED Notes (Signed)
pts CBG 97 reported to RN  

## 2013-12-22 NOTE — ED Notes (Signed)
Placed pt on enteric precautions until CDiff is ruled out.

## 2013-12-22 NOTE — ED Notes (Signed)
All belongings sent to [id number]

## 2013-12-22 NOTE — Progress Notes (Signed)
Admission note:  Arrival Method: Patient arrived from ED on a stretcher with staff accompanying.   Mental Orientation:  Alert and oriented x 4. Telemetry: On telemetry box 6E-07, notified CCMD. Assessment: See doc flow sheets. Skin: Warm, dry and intact.  IV: Right forearm, saline locked. Pain: Currently voiced no pain. Safety Measures: Patient educated about the fall safety measures, understood and acknowledged. Admission Screening: In progress. 6700 Orientation: Patient has been oriented to the unit, staff and to the room.

## 2013-12-22 NOTE — ED Notes (Signed)
Family went in to wake pt. Unresponsive CBG 38 EMS CBG 21 gave 1 amp D-50 and 1 tube oral glucose post D-50 now 151. No complaints at this time. Pt supposed to be here in short stay to begin work up for dialysis. 1 episode of incontinent diarrhea with EMS. A/O 4.  IV 22 Rt FA

## 2013-12-22 NOTE — H&P (Signed)
[facility] History and Physical  [name] [name] [name] JXB:[id number] DOB: 03-16-1955 DOA: 12/22/2013  Referring physician:  PCP: Lonia BloodGARBA,LAWAL, MD   Chief Complaint: Unresponsiveness  HPI: [name] [name] is a 58 y.o. male with an extensive past medical history including stage IV chronic kidney disease not on hemodialysis, history of DVT involving right upper extremity that was diagnosed in September 2015, had been on Coumadin therapy, insulin-dependent diabetes mellitus, who was admitted to the medicine service recently on 11/03/2013, discharged on 11/06/2013 at which time he presented with hypoglycemia. At the time he was discharged in stable condition to skilled nursing facility on Lantus 12 units subcutaneous daily with sliding scale coverage. Today he was found unresponsive by family members this morning as they were trying to wake him up to get ready for a doctor's appointment. They noted him to be unresponsive, checked blood sugar which was low at 38. Called 911, EMS administered D50 with by mouth glucose at the scene with subsequent improvement in blood sugar to 151. During my evaluation in the emergency room he was awake, alert, oriented. He states having generalized weakness over the past several weeks then the last several days developing diarrhea. He denies fevers, chills, bloody stools, melena, hematemesis, nausea, vomiting. Lab work in the emergency room showed a hemoglobin of 7.5 with blood sugar of 76.    Review of Systems:  Constitutional:  No weight loss, night sweats, Fevers, chills, positive for fatigue and poor tolerance to physical exertion  HEENT:  No headaches, Difficulty swallowing,Tooth/dental problems,Sore throat,  No sneezing, itching, ear ache, nasal congestion, post nasal drip,  Cardio-vascular:  No chest pain, Orthopnea, PND, swelling in lower extremities, anasarca, dizziness, palpitations  GI:  No heartburn, indigestion, abdominal pain, nausea, vomiting,  change in bowel habits, loss of appetite positive for diarrhea Resp:  No shortness of breath with exertion or at rest. No excess mucus, no productive cough, No non-productive cough, No coughing up of blood.No change in color of mucus.No wheezing.No chest wall deformity  Skin:  no rash or lesions.  GU:  no dysuria, change in color of urine, no urgency or frequency. No flank pain.  Musculoskeletal:  No joint pain or swelling. No decreased range of motion. No back pain.  Psych:  No change in mood or affect. No depression or anxiety. No memory loss.   Past Medical History  Diagnosis Date  . Hypertension   . CHF (congestive heart failure)   . Seizures   . Tuberculosis 1960's    "got the tx for it" (10/27/2013)  . Type II diabetes mellitus   . Hepatitis C   . Stroke 06/2013    "LLE paralyzed since" (10/27/2013)  . Arthritis     "left knee hurts" (10/27/2013)  . Chronic kidney disease (CKD), stage IV (severe)   . Hypoglycemia 11/03/2013  . Brittle diabetes   . DVT (deep venous thrombosis)   . Hx of medication noncompliance    Past Surgical History  Procedure Laterality Date  . Av fistula placement Left 09/11/2013    Procedure: Insertion of arteriovenous gortex graft;  Surgeon: Chuck Hinthristopher S Dickson, MD;  [facility];  Service: Vascular;  Laterality: Left;  . Laceration repair Left ?2014    "cut my leg falling off a truck"  . Loop recorder implant  05/2012   Social History:  reports that he has been smoking Cigarettes.  He has a 10.5 pack-year smoking history. He has never used smokeless tobacco. He reports that he does not drink alcohol or  use illicit drugs.  Allergies  Allergen Reactions  . Pork-Derived Conservation officer, nature and Other (See Comments)    The patient has tolerated lovenox    Family History  Problem Relation Age of Onset  . COPD Mother   . Hypertension Mother   . Diabetes Mother      Prior to Admission medications   Medication Sig Start Date End Date Taking?  Authorizing Provider  amLODipine (NORVASC) 10 MG tablet Take 10 mg by mouth daily.   Yes Historical Provider, MD  aspirin 81 MG tablet Take 81 mg by mouth daily.   Yes Historical Provider, MD  atorvastatin (LIPITOR) 20 MG tablet Take 1 tablet (20 mg total) by mouth daily at 6 PM. 11/06/13  Yes Tora Kindred York, PA-C  acetaminophen (TYLENOL) 325 MG tablet Take 2 tablets (650 mg total) by mouth every 6 (six) hours as needed for mild pain (or Fever >/= 101). 11/06/13   Stephani Police, PA-C  calcitRIOL (ROCALTROL) 0.25 MCG capsule Take 1 capsule (0.25 mcg total) by mouth daily. 09/12/13   Zannie Cove, MD  calcium acetate (PHOSLO) 667 MG capsule Take 667 mg by mouth 3 (three) times daily with meals.    Historical Provider, MD  carvedilol (COREG) 12.5 MG tablet Take 1 tablet (12.5 mg total) by mouth 2 (two) times daily with a meal. 11/06/13   Tora Kindred York, PA-C  furosemide (LASIX) 80 MG tablet Take 1 tablet (80 mg total) by mouth daily. 09/12/13   Zannie Cove, MD  glucose blood test strip Use as instructed 11/01/13   Courtney A Forcucci, PA-C  insulin aspart (NOVOLOG) 100 UNIT/ML injection Inject 0-12 Units into the skin 4 (four) times daily -  with meals and at bedtime. SSI  BS  150-200  2 u    To be given SQ                 201-250  4 u                 251-300  6 u                 301-350  8 u                  351-400  10 u                   > 400  12 u and call MD 11/06/13   Stephani Police, PA-C  insulin glargine (LANTUS) 100 UNIT/ML injection Inject 0.12 mLs (12 Units total) into the skin at bedtime. 11/06/13   Tora Kindred York, PA-C  sodium bicarbonate 650 MG tablet Take 2 tablets (1,300 mg total) by mouth 2 (two) times daily. 10/29/13   Christiane Ha, MD  thiamine (VITAMIN B-1) 100 MG tablet Take 100 mg by mouth every morning.    Historical Provider, MD  warfarin (COUMADIN) 3 MG tablet Take 2 mg by mouth daily at 6 PM. 11/06/13   Maretta Bees, MD   Physical Exam: Filed Vitals:    12/22/13 0845 12/22/13 0850 12/22/13 0915 12/22/13 1000  BP: 167/84 167/84 193/94 196/87  Pulse: 87 91 87 81  Temp: 97.3 F (36.3 C)     TempSrc: Oral     Resp: 9 18 15 20   SpO2: 97% 100% 96% 95%    Wt Readings from Last 3 Encounters:  12/08/13 46.267 kg (102 lb)  11/05/13 55.112 kg (121 lb 8 oz)  11/01/13  50.803 kg (112 lb)    General:  Appears calm and comfortable, chronically ill-appearing though Eyes: PERRL, normal lids, irises & pale conjunctiva ENT: grossly normal hearing, lips & tongue, dry oral mucosa Neck: no LAD, masses or thyromegaly Cardiovascular: RRR, no m/r/g. No LE edema. Telemetry: SR, no arrhythmias  Respiratory: CTA bilaterally, no w/r/r. Normal respiratory effort. Abdomen: soft, ntnd Skin: no rash or induration seen on limited exam Musculoskeletal: grossly normal tone BUE/BLE Psychiatric: grossly normal mood and affect, speech fluent and appropriate Neurologic: grossly non-focal.          Labs on Admission:  Basic Metabolic Panel:  Recent Labs Lab 12/22/13 0915  NA 142  K 3.8  CL 104  CO2 24  GLUCOSE 126*  BUN 69*  CREATININE 3.70*  CALCIUM 8.1*   Liver Function Tests:  Recent Labs Lab 12/22/13 0915  AST 15  ALT 6  ALKPHOS 74  BILITOT 0.3  PROT 7.0  ALBUMIN 2.0*   No results for input(s): LIPASE, AMYLASE in the last 168 hours. No results for input(s): AMMONIA in the last 168 hours. CBC:  Recent Labs Lab 12/22/13 0915  WBC 4.9  NEUTROABS 3.2  HGB 7.5*  HCT 23.9*  MCV 89.5  PLT 267   Cardiac Enzymes:  Recent Labs Lab 12/22/13 0915  TROPONINI <0.30    BNP (last 3 results) No results for input(s): PROBNP in the last 8760 hours. CBG:  Recent Labs Lab 12/22/13 1104  GLUCAP 76    Radiological Exams on Admission: No results found.  EKG: Independently reviewed.   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 Hypoglycemia. Patient having a history of a insulin-dependent type 2 diabetes mellitus along with stage V chronic kidney disease. He has had previous hospitalizations secondary to hypoglycemia. This morning he was found unresponsive with blood sugars in the 30s. He likely has decreased insulin metabolism from chronic kidney disease. After being administered dextrose blood sugars increasing. Will discontinue basal insulin for now, place him on sliding scale coverage with Accu-Cheks before every meal and at bedtime.  2. Acute on chronic anemia. Patient with history of chronic normocytic anemia requiring blood transfusions in the past. Likely secondary to stage V chronic kidney disease. He denies GI bleed. Will check stool for occult blood. He complains of generalized weakness over the past 2 weeks therefore will type and cross and will transfuse 2 units of packed red blood cells.  3. History of deep venous thrombosis. Patient diagnosed with DVT involving right upper extremity in September 2015. He presents with a subtherapeutic INR, therefore will bridge with IV heparin. I suspect subtherapeutic INR is likely due to medication nonadherence, as he was not sure if he was taken this or not. Order for pharmacy consultation for dosing of IV heparin placed. 4. Type 2 diabetes mellitus. Patient continues having issues with hypoglycemia. Will stop Lantus, cover him with sliding scale only. 5. Unresponsiveness. I think likely secondary to hypoglycemia, resolved by the time he reached the emergency room. Will monitor closely 6. Hypertension. Blood pressures elevated in the emergency room. Will provide as needed hydralazine for systolic blood pressures greater than 165. Add Imdur 60 mg by mouth daily, continue  Coreg 12.5 mg  by mouth twice a day, Norvasc 10 mg by mouth daily.  7. Chronic anticoagulation. Pharmacy consultation placed for Coumadin management meanwhile bridge with IV heparin  Code Status: Full code DVT Prophylaxis: Patient fully anticoagulated Disposition Plan: Will admit patient to telemetry, anticipate he'll require greater than 2 nights hospitalization  Time spent: 70 min  Jeralyn BennettZAMORA, Bryona Foxworthy [facility] Pager [phone]

## 2013-12-22 NOTE — ED Provider Notes (Signed)
CSN: [id number]     Arrival date & time 12/22/13  0840 History   First MD Initiated Contact with Patient 12/22/13 [phone]     Chief Complaint  Patient presents with  . Hypoglycemia     HPI Pt was seen at 0905. Per EMS, pt's family and pt: c/o unknown onset and resolution of one episode of hypoglycemia that occurred this morning. Pt's family states they went to his room to wake him up to get ready for a doctor's appointment when they noticed he was "unresponsive" and incont of bowel and bladder. Denies apnea/pulselessness. Family checked pt's CBG and is was "63." EMS gave IV D50 and PO glucose with improvement of CBG to "151." Pt states he had multiple episodes of "loose stools" overnight last night, as well as "frequent urination." Pt's family states his PMD recently increased pt's lantus to 14 units SQ at bedtime. Denies CP/palpitations, no SOB/cough, no abd pain, no N/V, no black or blood in stools.      Past Medical History  Diagnosis Date  . Hypertension   . CHF (congestive heart failure)   . Seizures   . Tuberculosis 1960's    "got the tx for it" (10/27/2013)  . Type II diabetes mellitus   . Hepatitis C   . Stroke 06/2013    "LLE paralyzed since" (10/27/2013)  . Arthritis     "left knee hurts" (10/27/2013)  . Chronic kidney disease (CKD), stage IV (severe)   . Hypoglycemia 11/03/2013  . Brittle diabetes   . DVT (deep venous thrombosis)   . Hx of medication noncompliance    Past Surgical History  Procedure Laterality Date  . Av fistula placement Left 09/11/2013    Procedure: Insertion of arteriovenous gortex graft;  Surgeon: Chuck Hint, MD;  Location: [facility] OR;  Service: Vascular;  Laterality: Left;  . Laceration repair Left ?2014    "cut my leg falling off a truck"  . Loop recorder implant  05/2012   Family History  Problem Relation Age of Onset  . COPD Mother   . Hypertension Mother   . Diabetes Mother    History  Substance Use Topics  . Smoking status: Current  Every Day Smoker -- 0.25 packs/day for 42 years    Types: Cigarettes  . Smokeless tobacco: Never Used  . Alcohol Use: No    Review of Systems ROS: Statement: All systems negative except as marked or noted in the HPI; Constitutional: Negative for fever and chills. ; ; Eyes: Negative for eye pain, redness and discharge. ; ; ENMT: Negative for ear pain, hoarseness, nasal congestion, sinus pressure and sore throat. ; ; Cardiovascular: Negative for chest pain, palpitations, diaphoresis, dyspnea and peripheral edema. ; ; Respiratory: Negative for cough, wheezing and stridor. ; ; Gastrointestinal: +diarrhea. Negative for nausea, vomiting, abdominal pain, blood in stool, hematemesis, jaundice and rectal bleeding. . ; ; Genitourinary: +frequent urination. Negative for dysuria, flank pain and hematuria. ; ; Musculoskeletal: Negative for back pain and neck pain. Negative for swelling and trauma.; ; Skin: Negative for pruritus, rash, abrasions, blisters, bruising and skin lesion.; ; Neuro: Negative for headache, lightheadedness and neck stiffness. Negative for weakness, extremity weakness, paresthesias, involuntary movement, seizure and +syncope.      Allergies  Pork-derived products  Home Medications   Prior to Admission medications   Medication Sig Start Date End Date Taking? Authorizing Provider  amLODipine (NORVASC) 10 MG tablet Take 10 mg by mouth daily.   Yes Historical Provider, MD  aspirin  81 MG tablet Take 81 mg by mouth daily.   Yes Historical Provider, MD  atorvastatin (LIPITOR) 20 MG tablet Take 1 tablet (20 mg total) by mouth daily at 6 PM. 11/06/13  Yes Tora Kindred York, PA-C  acetaminophen (TYLENOL) 325 MG tablet Take 2 tablets (650 mg total) by mouth every 6 (six) hours as needed for mild pain (or Fever >/= 101). 11/06/13   Stephani Police, PA-C  calcitRIOL (ROCALTROL) 0.25 MCG capsule Take 1 capsule (0.25 mcg total) by mouth daily. 09/12/13   Zannie Cove, MD  calcium acetate (PHOSLO) 667  MG capsule Take 667 mg by mouth 3 (three) times daily with meals.    Historical Provider, MD  carvedilol (COREG) 12.5 MG tablet Take 1 tablet (12.5 mg total) by mouth 2 (two) times daily with a meal. 11/06/13   Tora Kindred York, PA-C  furosemide (LASIX) 80 MG tablet Take 1 tablet (80 mg total) by mouth daily. 09/12/13   Zannie Cove, MD  glucose blood test strip Use as instructed 11/01/13   Courtney A Forcucci, PA-C  insulin aspart (NOVOLOG) 100 UNIT/ML injection Inject 0-12 Units into the skin 4 (four) times daily -  with meals and at bedtime. SSI  BS  150-200  2 u    To be given SQ                 201-250  4 u                 251-300  6 u                 301-350  8 u                  351-400  10 u                   > 400  12 u and call MD 11/06/13   Stephani Police, PA-C  insulin glargine (LANTUS) 100 UNIT/ML injection Inject 0.12 mLs (12 Units total) into the skin at bedtime. 11/06/13   Tora Kindred York, PA-C  sodium bicarbonate 650 MG tablet Take 2 tablets (1,300 mg total) by mouth 2 (two) times daily. 10/29/13   Christiane Ha, MD  thiamine (VITAMIN B-1) 100 MG tablet Take 100 mg by mouth every morning.    Historical Provider, MD  warfarin (COUMADIN) 3 MG tablet Take 2 mg by mouth daily at 6 PM. 11/06/13   Shanker Levora Dredge, MD   BP 196/87 mmHg  Pulse 81  Temp(Src) 97.3 F (36.3 C) (Oral)  Resp 20  SpO2 95% Physical Exam  0910: Physical examination:  Nursing notes reviewed; Vital signs and O2 SAT reviewed;  Constitutional: Well developed, Well nourished, In no acute distress; Head:  Normocephalic, atraumatic; Eyes: EOMI, PERRL, No scleral icterus; ENMT: Mouth and pharynx normal, Mucous membranes dry; Neck: Supple, Full range of motion, No lymphadenopathy; Cardiovascular: Regular rate and rhythm, No gallop; Respiratory: Breath sounds clear & equal bilaterally, No wheezes.  Speaking full sentences with ease, Normal respiratory effort/excursion; Chest: Nontender, Movement normal; Abdomen: Soft,  Nontender, Nondistended, Normal bowel sounds; Genitourinary: No CVA tenderness; Extremities: Pulses normal, +LUE AV fistula with palp thrill. No tenderness, No edema, No calf edema or asymmetry.; Neuro: AA&Ox3, Major CN grossly intact.  Speech clear. No gross focal motor or sensory deficits in extremities.; Skin: Color normal, Warm, Dry.   ED Course  Procedures     EKG Interpretation   Date/Time:  Monday December 22 2013 08:59:10 EST Ventricular Rate:  88 PR Interval:  135 QRS Duration: 128 QT Interval:  430 QTC Calculation: 520 R Axis:   75 Text Interpretation:  Sinus rhythm Right bundle branch block When compared  with ECG of 11/03/2013 No significant change was found Confirmed by  [facility] Hospital[facility]CCMANUS  MD, Nicholos JohnsKATHLEEN [phone]([id number]) on 12/22/2013 9:23:17 AM      MDM  MDM Reviewed: previous chart, nursing note and vitals Reviewed previous: labs and ECG Interpretation: labs and ECG Total time providing critical care: 30-74 minutes. This excludes time spent performing separately reportable procedures and services. Consults: admitting MD   CRITICAL CARE Performed by: Laray AngerMCMANUS,Cythia Bachtel M Total critical care time: 35 Critical care time was exclusive of separately billable procedures and treating other patients. Critical care was necessary to treat or prevent imminent or life-threatening deterioration. Critical care was time spent personally by me on the following activities: development of treatment plan with patient and/or surrogate as well as nursing, discussions with consultants, evaluation of patient's response to treatment, examination of patient, obtaining history from patient or surrogate, ordering and performing treatments and interventions, ordering and review of laboratory studies, ordering and review of radiographic studies, pulse oximetry and re-evaluation of patient's condition.   Results for orders placed or performed during the hospital encounter of 12/22/13  Protime-INR  Result Value  Ref Range   Prothrombin Time 14.8 11.6 - 15.2 seconds   INR 1.14 0.00 - 1.49  CBC with Differential  Result Value Ref Range   WBC 4.9 4.0 - 10.5 K/uL   RBC 2.67 (L) 4.22 - 5.81 MIL/uL   Hemoglobin 7.5 (L) 13.0 - 17.0 g/dL   HCT 65.723.9 (L) 84.639.0 - 96.252.0 %   MCV 89.5 78.0 - 100.0 fL   MCH 28.1 26.0 - 34.0 pg   MCHC 31.4 30.0 - 36.0 g/dL   RDW 95.215.2 84.111.5 - 32.415.5 %   Platelets 267 150 - 400 K/uL   Neutrophils Relative % 65 43 - 77 %   Neutro Abs 3.2 1.7 - 7.7 K/uL   Lymphocytes Relative 28 12 - 46 %   Lymphs Abs 1.4 0.7 - 4.0 K/uL   Monocytes Relative 6 3 - 12 %   Monocytes Absolute 0.3 0.1 - 1.0 K/uL   Eosinophils Relative 1 0 - 5 %   Eosinophils Absolute 0.1 0.0 - 0.7 K/uL   Basophils Relative 0 0 - 1 %   Basophils Absolute 0.0 0.0 - 0.1 K/uL  Comprehensive metabolic panel  Result Value Ref Range   Sodium 142 137 - 147 mEq/L   Potassium 3.8 3.7 - 5.3 mEq/L   Chloride 104 96 - 112 mEq/L   CO2 24 19 - 32 mEq/L   Glucose, Bld 126 (H) 70 - 99 mg/dL   BUN 69 (H) 6 - 23 mg/dL   Creatinine, Ser 4.013.70 (H) 0.50 - 1.35 mg/dL   Calcium 8.1 (L) 8.4 - 10.5 mg/dL   Total Protein 7.0 6.0 - 8.3 g/dL   Albumin 2.0 (L) 3.5 - 5.2 g/dL   AST 15 0 - 37 U/L   ALT 6 0 - 53 U/L   Alkaline Phosphatase 74 39 - 117 U/L   Total Bilirubin 0.3 0.3 - 1.2 mg/dL   GFR calc non Af Amer 17 (L) >90 mL/min   GFR calc Af Amer 19 (L) >90 mL/min   Anion gap 14 5 - 15  Troponin I  Result Value Ref Range   Troponin I <0.30 <0.30 ng/mL  Lactic acid, plasma  Result Value Ref Range   Lactic Acid, Venous 1.6 0.5 - 2.2 mmol/L  Urinalysis, Routine w reflex microscopic  Result Value Ref Range   Color, Urine YELLOW YELLOW   APPearance CLEAR CLEAR   Specific Gravity, Urine 1.015 1.005 - 1.030   pH 6.5 5.0 - 8.0   Glucose, UA NEGATIVE NEGATIVE mg/dL   Hgb urine dipstick TRACE (A) NEGATIVE   Bilirubin Urine NEGATIVE NEGATIVE   Ketones, ur NEGATIVE NEGATIVE mg/dL   Protein, ur 914100 (A) NEGATIVE mg/dL   Urobilinogen, UA  0.2 0.0 - 1.0 mg/dL   Nitrite NEGATIVE NEGATIVE   Leukocytes, UA NEGATIVE NEGATIVE  Urine microscopic-add on  Result Value Ref Range   RBC / HPF 0-2 <3 RBC/hpf   Urine-Other LESS THAN 10 mL OF URINE SUBMITTED     Results for Elisabeth MostLEDGER, [name] [name] (MRN [id number]) as of 12/22/2013 11:03  Ref. Range 11/05/2013 19:25 11/06/2013 05:05 12/08/2013 09:11 12/22/2013 09:15  Hemoglobin Latest Range: 13.0-17.0 g/dL 8.2 (L) 8.5 (L) 8.5 (L) 7.5 (L)  HCT Latest Range: 39.0-52.0 % 25.0 (L) 25.2 (L)  23.9 (L)     1035:  VS remain stable. Given pt's c/o multiple stools overnight, will obtain stool for cdiff and GI pathogen panel. INR subtherapeutic; family now at bedside states "the kidney doctor took him off of that because his RBC were low." BUN/Cr per baseline. LD procrit on 12/08/13 but H/H today is lower than baseline; will start PRBC transfusion. Dx and testing d/w pt and family.  Questions answered.  Verb understanding, agreeable to admit.  T/C to [facility] Dr. Vanessa BarbaraZamora, case discussed, including:  HPI, pertinent PM/SHx, VS/PE, dx testing, ED course and treatment:  Agreeable to admit, requests to write temporary orders, obtain tele bed to team MCAdmits.     Samuel JesterKathleen Baine Decesare, DO 12/25/13 [phone]

## 2013-12-22 NOTE — ED Notes (Signed)
Phlebotomy at bedside.

## 2013-12-22 NOTE — ED Notes (Signed)
Report given to 6E RN Vonna KotykJay

## 2013-12-22 NOTE — ED Notes (Signed)
Assisted RN reposition pt in bed.

## 2013-12-22 NOTE — Plan of Care (Signed)
Problem: Phase I Progression Outcomes Goal: Voiding-avoid urinary catheter unless indicated Outcome: Completed/Met Date Met:  12/22/13

## 2013-12-23 DIAGNOSIS — I82409 Acute embolism and thrombosis of unspecified deep veins of unspecified lower extremity: Secondary | ICD-10-CM

## 2013-12-23 LAB — TYPE AND SCREEN
ABO/RH(D): B POS
ANTIBODY SCREEN: NEGATIVE
UNIT DIVISION: 0
Unit division: 0

## 2013-12-23 LAB — CBC
HCT: 31.8 % — ABNORMAL LOW (ref 39.0–52.0)
Hemoglobin: 10.5 g/dL — ABNORMAL LOW (ref 13.0–17.0)
MCH: 28.7 pg (ref 26.0–34.0)
MCHC: 33 g/dL (ref 30.0–36.0)
MCV: 86.9 fL (ref 78.0–100.0)
Platelets: 221 10*3/uL (ref 150–400)
RBC: 3.66 MIL/uL — ABNORMAL LOW (ref 4.22–5.81)
RDW: 15.3 % (ref 11.5–15.5)
WBC: 4.7 10*3/uL (ref 4.0–10.5)

## 2013-12-23 LAB — BASIC METABOLIC PANEL
Anion gap: 12 (ref 5–15)
BUN: 63 mg/dL — ABNORMAL HIGH (ref 6–23)
CO2: 21 mEq/L (ref 19–32)
Calcium: 7.5 mg/dL — ABNORMAL LOW (ref 8.4–10.5)
Chloride: 102 mEq/L (ref 96–112)
Creatinine, Ser: 3.71 mg/dL — ABNORMAL HIGH (ref 0.50–1.35)
GFR calc Af Amer: 19 mL/min — ABNORMAL LOW (ref >90)
GFR calc non Af Amer: 17 mL/min — ABNORMAL LOW (ref >90)
Glucose, Bld: 235 mg/dL — ABNORMAL HIGH (ref 70–99)
Potassium: 4.5 mEq/L (ref 3.7–5.3)
Sodium: 135 mEq/L — ABNORMAL LOW (ref 137–147)

## 2013-12-23 LAB — URINE CULTURE

## 2013-12-23 LAB — GLUCOSE, CAPILLARY
GLUCOSE-CAPILLARY: 131 mg/dL — AB (ref 70–99)
Glucose-Capillary: 250 mg/dL — ABNORMAL HIGH (ref 70–99)
Glucose-Capillary: 326 mg/dL — ABNORMAL HIGH (ref 70–99)
Glucose-Capillary: 286 mg/dL — ABNORMAL HIGH (ref 70–99)

## 2013-12-23 LAB — CLOSTRIDIUM DIFFICILE BY PCR: CDIFFPCR: POSITIVE — AB

## 2013-12-23 LAB — PROTIME-INR
INR: 1.22 (ref 0.00–1.49)
Prothrombin Time: 15.5 seconds — ABNORMAL HIGH (ref 11.6–15.2)

## 2013-12-23 MED ORDER — INSULIN ASPART 100 UNIT/ML ~~LOC~~ SOLN
0.0000 [IU] | Freq: Three times a day (TID) | SUBCUTANEOUS | Status: DC
  Administered 2013-12-23: 1 [IU] via SUBCUTANEOUS
  Administered 2013-12-23: 7 [IU] via SUBCUTANEOUS
  Administered 2013-12-24: 3 [IU] via SUBCUTANEOUS
  Administered 2013-12-24 (×2): 2 [IU] via SUBCUTANEOUS
  Administered 2013-12-25 (×2): 3 [IU] via SUBCUTANEOUS
  Administered 2013-12-25: 2 [IU] via SUBCUTANEOUS
  Administered 2013-12-26: 5 [IU] via SUBCUTANEOUS
  Administered 2013-12-26 – 2013-12-27 (×3): 2 [IU] via SUBCUTANEOUS

## 2013-12-23 MED ORDER — METRONIDAZOLE 500 MG PO TABS
500.0000 mg | ORAL_TABLET | Freq: Three times a day (TID) | ORAL | Status: DC
  Administered 2013-12-23 – 2013-12-27 (×14): 500 mg via ORAL
  Filled 2013-12-23 (×16): qty 1

## 2013-12-23 MED ORDER — NEPRO/CARBSTEADY PO LIQD
237.0000 mL | Freq: Three times a day (TID) | ORAL | Status: DC
  Administered 2013-12-23 – 2013-12-27 (×10): 237 mL via ORAL

## 2013-12-23 MED ORDER — WARFARIN SODIUM 4 MG PO TABS
4.0000 mg | ORAL_TABLET | Freq: Once | ORAL | Status: AC
  Administered 2013-12-23: 4 mg via ORAL
  Filled 2013-12-23: qty 1

## 2013-12-23 MED ORDER — INSULIN GLARGINE 100 UNIT/ML ~~LOC~~ SOLN
5.0000 [IU] | Freq: Every day | SUBCUTANEOUS | Status: DC
  Administered 2013-12-23 – 2013-12-24 (×2): 5 [IU] via SUBCUTANEOUS
  Filled 2013-12-23 (×3): qty 0.05

## 2013-12-23 NOTE — Clinical Documentation Improvement (Signed)
  According to the documented Height and Weight in CHL/EPIC, the patients BMI is 16.5. If your clinical findings/judgment agrees with this could you please help by documenting any associated diagnoses/conditions, such as morbid obesity, underweight, malnutrition (type), etc  Supporting Information: Estimated body mass index is 16.5 kg/(m^2) as calculated from the following:   Height as of this encounter: 5\' 7"  (1.702 m).   Weight as of this encounter: 105 lb 6.1 oz (47.8 kg).   Thank you for your time with this!   Servando SnareSalena Zubin Pontillo, RN Clinical Documentation Improvement Specialist ([phone]) [phone] / [phone]

## 2013-12-23 NOTE — Progress Notes (Signed)
INITIAL NUTRITION ASSESSMENT  Pt meets criteria for SEVERE MALNUTRITION in the context of chronic illness as evidenced by a 13% weight loss in 1 month and severe muscle mass loss.  DOCUMENTATION CODES Per approved criteria  -Severe malnutrition in the context of chronic illness -Underweight   INTERVENTION: Provide Nepro Shake po TID, each supplement provides 425 kcal and 19 grams protein.  Encourage adequate PO intake.  NUTRITION DIAGNOSIS: Increased nutrient needs related to chronic illness as evidenced by estimated nutrition needs.   Goal: Pt to meet >/= 90% of their estimated nutrition needs   Monitor:  PO intake, weight trends, labs, I/O's  Reason for Assessment: MST  58 y.o. male  Admitting Dx: Unresponsive episode  ASSESSMENT: Pt with PMH including stage IV chronic kidney disease not on hemodialysis, history of DVT involving right upper extremity  insulin-dependent diabetes mellitus at which time he presented with hypoglycemia. He was found unresponsive by family members. Blood sugar low at 38.  Pt reports having a good appetite current and PTA at home. Pt usually eats 3-4 full meals a day. Meal completion is 100%. Pt reports having weight loss and is unsure of the cause as he has been eating well. Noted pt with a 13% weight loss in 1 months. Pt is agreeable to oral supplements. RD to order Nepro Shake. Pt was encouraged to continue eating his food at meals and to drink his supplements.   Nutrition Focused Physical Exam:  Subcutaneous Fat:  Orbital Region: N/A Upper Arm Region: WNL Thoracic and Lumbar Region: WNL  Muscle:  Temple Region: WNL Clavicle Bone Region: WNL Clavicle and Acromion Bone Region: WNL Scapular Bone Region: N/A Dorsal Hand: Moderate depletion Patellar Region: Moderate depletion Anterior Thigh Region: Severe depletion Posterior Calf Region: Severe depletion  Edema: none  Labs: Low sodium, calcium, and GFR. High BUN and creatinine.    Height: Ht Readings from Last 1 Encounters:  12/22/13 5\' 7"  (1.702 m)    Weight: Wt Readings from Last 1 Encounters:  12/22/13 105 lb 6.1 oz (47.8 kg)    Ideal Body Weight: 148 lbs  % Ideal Body Weight: 71%  Wt Readings from Last 10 Encounters:  12/22/13 105 lb 6.1 oz (47.8 kg)  12/08/13 102 lb (46.267 kg)  11/05/13 121 lb 8 oz (55.112 kg)  11/01/13 112 lb (50.803 kg)  10/30/13 109 lb 9.1 oz (49.7 kg)  10/06/13 110 lb (49.896 kg)  10/01/13 112 lb (50.803 kg)  09/12/13 106 lb 11.2 oz (48.4 kg)    Usual Body Weight: 115 lbs  % Usual Body Weight: 91%  BMI:  Body mass index is 16.5 kg/(m^2). Underweight  Estimated Nutritional Needs: Kcal: 2000-2200 Protein: 85-100 grams Fluid: Per MD  Skin: intact  Diet Order: Diet renal W/127200mL fluid restriction  EDUCATION NEEDS: -No education needs identified at this time   Intake/Output Summary (Last 24 hours) at 12/23/13 0905 Last data filed at 12/23/13 0036  Gross per 24 hour  Intake   1170 ml  Output      0 ml  Net   1170 ml    Last BM: 11/23  Labs:   Recent Labs Lab 12/22/13 0915 12/23/13 0537  NA 142 135*  K 3.8 4.5  CL 104 102  CO2 24 21  BUN 69* 63*  CREATININE 3.70* 3.71*  CALCIUM 8.1* 7.5*  GLUCOSE 126* 235*    CBG (last 3)   Recent Labs  12/22/13 1653 12/22/13 2232 12/23/13 0744  GLUCAP 212* 324* 250*  Scheduled Meds: . sodium chloride   Intravenous Once  . amLODipine  10 mg Oral Daily  . aspirin EC  81 mg Oral Daily  . atorvastatin  20 mg Oral q1800  . calcitRIOL  0.25 mcg Oral Daily  . calcium acetate  667 mg Oral TID WC  . carvedilol  12.5 mg Oral BID WC  . enoxaparin (LOVENOX) injection  50 mg Subcutaneous Q24H  . furosemide  80 mg Oral Daily  . insulin aspart  0-5 Units Subcutaneous QHS  . isosorbide mononitrate  60 mg Oral Daily  . metroNIDAZOLE  500 mg Oral 3 times per day  . pantoprazole  40 mg Oral Daily  . sodium bicarbonate  1,300 mg Oral BID  . sodium chloride   3 mL Intravenous Q12H  . sodium chloride  3 mL Intravenous Q12H  . warfarin  4 mg Oral ONCE-1800  . Warfarin - Pharmacist Dosing Inpatient   Does not apply q1800    Continuous Infusions:   Past Medical History  Diagnosis Date  . Hypertension   . CHF (congestive heart failure)   . Seizures   . Tuberculosis 1960's    "got the tx for it" (10/27/2013)  . Type II diabetes mellitus   . Hepatitis C   . Stroke 06/2013    "LLE paralyzed since" (10/27/2013)  . Arthritis     "left knee hurts" (10/27/2013)  . Chronic kidney disease (CKD), stage IV (severe)   . Hypoglycemia 11/03/2013  . Brittle diabetes   . DVT (deep venous thrombosis)   . Hx of medication noncompliance     Past Surgical History  Procedure Laterality Date  . Av fistula placement Left 09/11/2013    Procedure: Insertion of arteriovenous gortex graft;  Surgeon: Chuck Hinthristopher S Dickson, MD;  [facility];  Service: Vascular;  Laterality: Left;  . Laceration repair Left ?2014    "cut my leg falling off a truck"  . Loop recorder implant  05/2012    Marijean NiemannStephanie La, MS, RD, LDN Pager # [phone] After hours/ weekend pager # [phone]

## 2013-12-23 NOTE — Progress Notes (Signed)
ANTICOAGULATION CONSULT NOTE - Follow Up Consult  Pharmacy Consult for coumadin and lovenox Indication: recent DVT  Allergies  Allergen Reactions  . Pork-Derived Conservation officer, natureroducts Hives and Other (See Comments)    The patient has tolerated lovenox    Patient Measurements: Height: 5\' 7"  (170.2 cm) Weight: 105 lb 6.1 oz (47.8 kg) IBW/kg (Calculated) : 66.1 Heparin Dosing Weight:   Vital Signs: Temp: 98.9 F (37.2 C) (11/24 0500) Temp Source: Oral (11/24 0500) BP: 154/77 mmHg (11/24 0500) Pulse Rate: 75 (11/24 0500)  Labs:  Recent Labs  12/22/13 0915 12/23/13 0537  HGB 7.5* 10.5*  HCT 23.9* 31.8*  PLT 267 221  LABPROT 14.8 15.5*  INR 1.14 1.22  CREATININE 3.70* 3.71*  TROPONINI <0.30  --     Estimated Creatinine Clearance: 14.9 mL/min (by C-G formula based on Cr of 3.71).   Medications:  Scheduled:  . sodium chloride   Intravenous Once  . amLODipine  10 mg Oral Daily  . aspirin EC  81 mg Oral Daily  . atorvastatin  20 mg Oral q1800  . calcitRIOL  0.25 mcg Oral Daily  . calcium acetate  667 mg Oral TID WC  . carvedilol  12.5 mg Oral BID WC  . enoxaparin (LOVENOX) injection  50 mg Subcutaneous Q24H  . furosemide  80 mg Oral Daily  . insulin aspart  0-5 Units Subcutaneous QHS  . isosorbide mononitrate  60 mg Oral Daily  . metroNIDAZOLE  500 mg Oral 3 times per day  . pantoprazole  40 mg Oral Daily  . sodium bicarbonate  1,300 mg Oral BID  . sodium chloride  3 mL Intravenous Q12H  . sodium chloride  3 mL Intravenous Q12H  . Warfarin - Pharmacist Dosing Inpatient   Does not apply q1800   Infusions:    Assessment: 58 yo male with recent DVT is currently on subtherapeutic coumadin bridging with treatment dose of lovenox.  INR today is 1.22 and SCr 3.71. CBC is stable.  Goal of Therapy:  Anti-Xa level 0.6-1 units/ml 4hrs after LMWH dose given; INR 2-3 Monitor platelets by anticoagulation protocol: Yes   Plan:  1. Lovenox 50mg  SQ q24h 2. Coumadin 4 mg po  tonight 3. Daily INR 4. CBC q72h while on Lovenox 5. Will consider checking anti-Xa level at Css 6. F/u home dose of coumadin when sister arrives (awaiting home med rec) Daymon Hora, Tsz-Yin 12/23/2013,8:18 AM

## 2013-12-23 NOTE — Progress Notes (Signed)
[facility] PROGRESS NOTE  [name] MostClarence Lips ZOX:[id number] DOB: 08/07/1955 DOA: 12/22/2013 PCP: Lonia BloodGARBA,LAWAL, MD  Assessment/Plan: 1. Hypoglycemia -in setting of CKD 4, infection/cdiff -improved, will resume lantus at lower dose -sensitive SSI  2. Acute on chronic anemia/chronic normocytic anemia requiring blood transfusions in the past -secondary to stage V chronic kidney disease, no overt bleeding, hemoccult trace positive in setting of Cdiff colitis  -transfused 2units PRBC yesterday, monitor Hb  3. Cdiff colitis -PCR positive this am, pt reports diarrhea off and on for weeks -start PO Flagyl  4. History of deep venous thrombosis. Patient diagnosed with DVT involving right upper extremity in September 2015.  -suspect subtherapeutic INR is likely due to medication nonadherence, bridge coumadin with heparin, monitor for bleeding  5. Unresponsiveness.  -secondary to hypoglycemia, resolved   6. CKD 4 -stable, resume PO Lasix and Na-bicarb  7. Hypertension -BP uncontrolled, PRN hydralazine, Imdur, continue Coreg and norvasc  Code Status: Full Code Family Communication: none at bedside Disposition Plan: home in 1-2days   HPI/Subjective: Feels better, diarrhea improving  Objective: Filed Vitals:   12/23/13 1000  BP: 174/78  Pulse: 77  Temp: 98.2 F (36.8 C)  Resp: 18    Intake/Output Summary (Last 24 hours) at 12/23/13 1049 Last data filed at 12/23/13 0900  Gross per 24 hour  Intake   1410 ml  Output      0 ml  Net   1410 ml   Filed Weights   12/22/13 1219  Weight: 47.8 kg (105 lb 6.1 oz)    Exam:   General:  AAOx3, thinly built  Cardiovascular: S1S2/RRR  Respiratory: CTAB  Abdomen: soft, Nt, BS present  Musculoskeletal: no edema c/c   Data Reviewed: Basic Metabolic Panel:  Recent Labs Lab 12/22/13 0915 12/23/13 0537  NA 142 135*  K 3.8 4.5  CL 104 102  CO2 24 21  GLUCOSE 126* 235*  BUN 69* 63*  CREATININE 3.70* 3.71*  CALCIUM  8.1* 7.5*   Liver Function Tests:  Recent Labs Lab 12/22/13 0915  AST 15  ALT 6  ALKPHOS 74  BILITOT 0.3  PROT 7.0  ALBUMIN 2.0*   No results for input(s): LIPASE, AMYLASE in the last 168 hours. No results for input(s): AMMONIA in the last 168 hours. CBC:  Recent Labs Lab 12/22/13 0915 12/23/13 0537  WBC 4.9 4.7  NEUTROABS 3.2  --   HGB 7.5* 10.5*  HCT 23.9* 31.8*  MCV 89.5 86.9  PLT 267 221   Cardiac Enzymes:  Recent Labs Lab 12/22/13 0915  TROPONINI <0.30   BNP (last 3 results) No results for input(s): PROBNP in the last 8760 hours. CBG:  Recent Labs Lab 12/22/13 1146 12/22/13 1204 12/22/13 1653 12/22/13 2232 12/23/13 0744  GLUCAP 97 123* 212* 324* 250*    Recent Results (from the past 240 hour(s))  Clostridium Difficile by PCR     Status: Abnormal   Collection Time: 12/22/13  6:00 PM  Result Value Ref Range Status   C difficile by pcr POSITIVE (A) NEGATIVE Final    Comment: CRITICAL RESULT CALLED TO, READ BACK BY AND VERIFIED WITH: K.WICKER,RN 12/23/13 0810 BY BSLADE      Studies: No results found.  Scheduled Meds: . sodium chloride   Intravenous Once  . amLODipine  10 mg Oral Daily  . aspirin EC  81 mg Oral Daily  . atorvastatin  20 mg Oral q1800  . calcitRIOL  0.25 mcg Oral Daily  . calcium acetate  667 mg Oral  TID WC  . carvedilol  12.5 mg Oral BID WC  . enoxaparin (LOVENOX) injection  50 mg Subcutaneous Q24H  . furosemide  80 mg Oral Daily  . insulin aspart  0-5 Units Subcutaneous QHS  . isosorbide mononitrate  60 mg Oral Daily  . metroNIDAZOLE  500 mg Oral 3 times per day  . pantoprazole  40 mg Oral Daily  . sodium bicarbonate  1,300 mg Oral BID  . sodium chloride  3 mL Intravenous Q12H  . sodium chloride  3 mL Intravenous Q12H  . warfarin  4 mg Oral ONCE-1800  . 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 If 7PM-7AM, please contact night-coverage at [email or web address], password [facility] 12/23/2013, 10:49 AM  LOS: 1 day

## 2013-12-23 NOTE — Plan of Care (Signed)
Problem: Phase II Progression Outcomes Goal: Obtain order to discontinue catheter if appropriate Outcome: Not Applicable Date Met:  12/23/13     

## 2013-12-23 NOTE — Plan of Care (Signed)
Problem: Phase I Progression Outcomes Goal: OOB as tolerated unless otherwise ordered Outcome: Completed/Met Date Met:  12/23/13 Goal: Hemodynamically stable Outcome: Completed/Met Date Met:  12/23/13     

## 2013-12-23 NOTE — Plan of Care (Signed)
Problem: Phase I Progression Outcomes Goal: Pain controlled with appropriate interventions Outcome: Completed/Met Date Met:  12/23/13  Problem: Phase II Progression Outcomes Goal: IV changed to normal saline lock Outcome: Completed/Met Date Met:  12/23/13

## 2013-12-24 DIAGNOSIS — N189 Chronic kidney disease, unspecified: Secondary | ICD-10-CM | POA: Insufficient documentation

## 2013-12-24 LAB — CBC
HCT: 32.5 % — ABNORMAL LOW (ref 39.0–52.0)
Hemoglobin: 10.6 g/dL — ABNORMAL LOW (ref 13.0–17.0)
MCH: 27.7 pg (ref 26.0–34.0)
MCHC: 32.6 g/dL (ref 30.0–36.0)
MCV: 84.9 fL (ref 78.0–100.0)
PLATELETS: 215 10*3/uL (ref 150–400)
RBC: 3.83 MIL/uL — AB (ref 4.22–5.81)
RDW: 14.8 % (ref 11.5–15.5)
WBC: 4.8 10*3/uL (ref 4.0–10.5)

## 2013-12-24 LAB — GLUCOSE, CAPILLARY
Glucose-Capillary: 164 mg/dL — ABNORMAL HIGH (ref 70–99)
Glucose-Capillary: 170 mg/dL — ABNORMAL HIGH (ref 70–99)
Glucose-Capillary: 207 mg/dL — ABNORMAL HIGH (ref 70–99)
Glucose-Capillary: 229 mg/dL — ABNORMAL HIGH (ref 70–99)

## 2013-12-24 LAB — BASIC METABOLIC PANEL
ANION GAP: 13 (ref 5–15)
BUN: 62 mg/dL — ABNORMAL HIGH (ref 6–23)
CHLORIDE: 102 meq/L (ref 96–112)
CO2: 20 mEq/L (ref 19–32)
CREATININE: 3.84 mg/dL — AB (ref 0.50–1.35)
Calcium: 8.2 mg/dL — ABNORMAL LOW (ref 8.4–10.5)
GFR calc non Af Amer: 16 mL/min — ABNORMAL LOW (ref >90)
GFR, EST AFRICAN AMERICAN: 19 mL/min — AB (ref >90)
Glucose, Bld: 184 mg/dL — ABNORMAL HIGH (ref 70–99)
POTASSIUM: 4.1 meq/L (ref 3.7–5.3)
Sodium: 135 mEq/L — ABNORMAL LOW (ref 137–147)

## 2013-12-24 LAB — PROTIME-INR
INR: 1.4 (ref 0.00–1.49)
PROTHROMBIN TIME: 17.3 s — AB (ref 11.6–15.2)

## 2013-12-24 MED ORDER — WARFARIN SODIUM 2.5 MG PO TABS
2.5000 mg | ORAL_TABLET | Freq: Once | ORAL | Status: DC
  Filled 2013-12-24: qty 1

## 2013-12-24 MED ORDER — WARFARIN SODIUM 2 MG PO TABS
2.0000 mg | ORAL_TABLET | Freq: Once | ORAL | Status: AC
  Administered 2013-12-24: 2 mg via ORAL
  Filled 2013-12-24 (×2): qty 1

## 2013-12-24 NOTE — Plan of Care (Signed)
Problem: Phase I Progression Outcomes Goal: Other Phase I Outcomes/Goals Outcome: Completed/Met Date Met:  12/24/13  Problem: Phase II Progression Outcomes Goal: Other Phase II Outcomes/Goals Outcome: Completed/Met Date Met:  12/24/13  Problem: Phase III Progression Outcomes Goal: Pain controlled on oral analgesia Outcome: Completed/Met Date Met:  12/24/13 Goal: Activity at appropriate level-compared to baseline (UP IN CHAIR FOR HEMODIALYSIS)  Outcome: Completed/Met Date Met:  12/24/13

## 2013-12-24 NOTE — Evaluation (Addendum)
Physical Therapy Evaluation Patient Details Name: [name] MostClarence Tufano MRN: [id number] DOB: December 29, 1955 Today's Date: 12/24/2013   History of Present Illness  Pt is a 58 y.o. 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 At the time he was discharged in stable condition to SNF. Day of admission he was found unresponsive by family members as they were trying to wake him up to get ready for a doctor's appointment. They noted him to be unresponsive, checked blood sugar which was low at 38. Called 911, EMS administered D50 with by mouth glucose at the scene with subsequent improvement in blood sugar to 151. In ED, he states having generalized weakness over the past several weeks then the last several days developing diarrhea.   Clinical Impression  Pt admitted with the above. Pt currently with functional limitations due to the deficits listed below (see PT Problem List). At the time of PT eval pt was able to perform transfers and ambulation with min guard assist and RW. Noted foot drop during gait training. Sister present during beginning of session and states that they got approval for a knee brace but have not gotten it yet. Pt will benefit from skilled PT to increase their independence and safety with mobility to allow discharge to the venue listed below.       Follow Up Recommendations Home health PT;Supervision for mobility/OOB    Equipment Recommendations  Rolling walker with 5" wheels    Recommendations for Other Services       Precautions / Restrictions Precautions Precautions: Fall Restrictions Weight Bearing Restrictions: No      Mobility  Bed Mobility Overal bed mobility: Needs  Assistance Bed Mobility: Supine to Sit     Supine to sit: Modified independent (Device/Increase time)     General bed mobility comments: Increased time required to transition to EOB. Bed rail use for UE support.   Transfers Overall transfer level: Needs assistance Equipment used: Rolling walker (2 wheeled) Transfers: Sit to/from Stand Sit to Stand: Min guard assist         General transfer comment: Pt was able to power-up to full standing position without assistance. Pt demonstrated proper hand placement and safety awareness.   Ambulation/Gait Ambulation/Gait assistance: Min guard Ambulation Distance (Feet): 375 Feet Assistive device: Rolling walker (2 wheeled) Gait Pattern/deviations: Step-through pattern;Decreased stride length;Trunk flexed;Decreased dorsiflexion - left Gait velocity: Decreased Gait velocity interpretation: Below normal speed for age/gender General Gait Details: Pt with noted foot drop on the L side which increased with fatigue. Pt also reports that L knee felt to be buckling towards end of gait training.   Stairs            Wheelchair Mobility    Modified Rankin (Stroke Patients Only)       Balance Overall balance assessment: History of Falls;Needs assistance Sitting-balance support: Feet supported;No upper extremity supported Sitting balance-Leahy Scale: Fair     Standing balance support: Bilateral upper extremity supported;During functional activity Standing balance-Leahy Scale: Poor Standing balance comment: Pt requires UE support for dynamic activity.                              Pertinent Vitals/Pain Pain Assessment: No/denies pain  Home Living Family/patient expects to be discharged to:: Private residence Living Arrangements: Other relatives Available Help at Discharge: Family;Available 24 hours/day Type of Home: Apartment Home Access: Stairs to enter   Entrance Stairs-Number of Steps: 16 Home Layout: Two  level;Bed/bath upstairs Home Equipment: Cane - single point;Wheelchair - manual;Toilet riser;Shower seat;Walker - standard      Prior Function Level of Independence: Independent with assistive device(s)         Comments: No longer drives, not working. community ambulator, uses motorized cart when going to the store. States he uses his walker all the time.     Hand Dominance   Dominant Hand: Right    Extremity/Trunk Assessment   Upper Extremity Assessment: Defer to OT evaluation;LUE deficits/detail       LUE Deficits / Details: Residual weakness from previous CVA   Lower Extremity Assessment: Generalized weakness;LLE deficits/detail   LLE Deficits / Details: Residual weakness from previous CVA. Foot drop noted, and pt states he is to get a brace for his knee.   Cervical / Trunk Assessment: Normal  Communication   Communication: No difficulties  Cognition Arousal/Alertness: Awake/alert Behavior During Therapy: WFL for tasks assessed/performed Overall Cognitive Status: Within Functional Limits for tasks assessed                      General Comments      Exercises        Assessment/Plan    PT Assessment Patient needs continued PT services  PT Diagnosis Difficulty walking;Generalized weakness   PT Problem List Decreased strength;Decreased range of motion;Decreased activity tolerance;Decreased balance;Decreased mobility;Decreased knowledge of use of DME;Decreased safety awareness;Decreased knowledge of precautions  PT Treatment Interventions DME instruction;Gait training;Stair training;Functional mobility training;Therapeutic activities;Therapeutic exercise;Neuromuscular re-education;Patient/family education   PT Goals (Current goals can be found in the Care Plan section) Acute Rehab PT Goals Patient Stated Goal: Be indpendent PT Goal Formulation: With patient Time For Goal Achievement: 12/31/13 Potential to Achieve Goals: Good    Frequency Min 3X/week    Barriers to discharge        Co-evaluation               End of Session Equipment Utilized During Treatment: Gait belt Activity Tolerance: Patient tolerated treatment well Patient left: in chair;with call bell/phone within reach Nurse Communication: Mobility status         Time: 1455-1536 PT Time Calculation (min) (ACUTE ONLY): 41 min   Charges:   PT Evaluation $Initial PT Evaluation Tier I: 1 Procedure PT Treatments $Gait Training: 8-22 mins   PT G Codes:          Conni SlipperKirkman, Catlin Aycock 12/24/2013, 4:14 PM  Conni SlipperLaura Hawraa Stambaugh, PT, DPT Acute Rehabilitation Services Pager: [phone]  Addendum: Changed to correct treatment time, which will include one additional charge (8-22 mins) to therapeutic activity.  Conni SlipperLaura Jye Fariss, PT, DPT Acute Rehabilitation Services Pager: [phone]

## 2013-12-24 NOTE — Progress Notes (Signed)
PROGRESS NOTE  [name] [name] ZOX:[id number] DOB: Jun 18, 1955 DOA: 12/22/2013 PCP: Lonia BloodGARBA,LAWAL, MD  Assessment/Plan: Hypoglycemia -Likely due to prolonged half-life of insulin due to the patient's CKD stage IV in combination with his poor eating habits -Family at the bedside states that patient frequently goes almost 24 hours without eating at home -Restart Lantus 5 units daily -Continue NovoLog sliding scale -I have instructed the patient and family not to give his sliding scale NovoLog if he eats less than 50% of his meals -Family states that his Lantus was increased to 16 units daily since his last discharge from the hospital C diff Colitis -Continue oral metronidazole Diabetes mellitus type 2 with renal complications -Continue Lantus and monitor CBGs -Education provided regarding meals and insulin Anemia of chronic kidney disease -Patient received 2 units PRBC -Hemoglobin has remained stable -Baseline hemoglobin approximately 8 DVT right upper extremity -Diagnosed September 2015 -Continue warfarin -Subtherapeutic INR due to noncompliance Acute encephalopathy -Resolved -Secondary to hypoglycemia CKD stage IV -Baseline creatinine 3.6-3.9 -Continue bicarbonate Hypertension -Continue amlodipine, carvedilol, furosemide, Imdur History of stroke -Continue aspirin 81 mg daily -Continue statin -Monitor LFTs Substance abuse -Previous UDS positive for marijuana   Family Communication:   Sister at beside--total time 35 min, >50% spent counseling and coordinating care Disposition Plan:   Home when medically stable        Procedures/Studies:  No results found.      Subjective: Patient denies fevers, chills, headache, chest pain, dyspnea, nausea, vomiting, diarrhea, abdominal pain, dysuria, hematuria   Objective: Filed Vitals:   12/23/13 1740 12/23/13 2144 12/24/13 0540 12/24/13 0932  BP: 140/73 145/67 178/71 177/83  Pulse: 73 71 76 76  Temp: 98.6  F (37 C) 98.3 F (36.8 C) 98.6 F (37 C) 98.4 F (36.9 C)  TempSrc: Oral Oral Oral Oral  Resp:  17 16 18   Height:      Weight:      SpO2: 97% 94% 97% 98%    Intake/Output Summary (Last 24 hours) at 12/24/13 1508 Last data filed at 12/24/13 1209  Gross per 24 hour  Intake    700 ml  Output    900 ml  Net   -200 ml   Weight change:  Exam:   General:  Pt is alert, follows commands appropriately, not in acute distress  HEENT: No icterus, No thrush,  [place]/AT  Cardiovascular: RRR, S1/S2, no rubs, no gallops  Respiratory: CTA bilaterally, no wheezing, no crackles, no rhonchi  Abdomen: Soft/+BS, non tender, non distended, no guarding  Extremities: No edema, No lymphangitis, No petechiae, No rashes, no synovitis  Data Reviewed: Basic Metabolic Panel:  Recent Labs Lab 12/22/13 0915 12/23/13 0537 12/24/13 0547  NA 142 135* 135*  K 3.8 4.5 4.1  CL 104 102 102  CO2 24 21 20   GLUCOSE 126* 235* 184*  BUN 69* 63* 62*  CREATININE 3.70* 3.71* 3.84*  CALCIUM 8.1* 7.5* 8.2*   Liver Function Tests:  Recent Labs Lab 12/22/13 0915  AST 15  ALT 6  ALKPHOS 74  BILITOT 0.3  PROT 7.0  ALBUMIN 2.0*   No results for input(s): LIPASE, AMYLASE in the last 168 hours. No results for input(s): AMMONIA in the last 168 hours. CBC:  Recent Labs Lab 12/22/13 0915 12/23/13 0537 12/24/13 0547  WBC 4.9 4.7 4.8  NEUTROABS 3.2  --   --   HGB 7.5* 10.5* 10.6*  HCT 23.9* 31.8* 32.5*  MCV 89.5 86.9 84.9  PLT 267 221 215   Cardiac Enzymes:  Recent Labs Lab 12/22/13 0915  TROPONINI <0.30   BNP: Invalid input(s): POCBNP CBG:  Recent Labs Lab 12/23/13 1158 12/23/13 1603 12/23/13 2142 12/24/13 0743 12/24/13 1148  GLUCAP 326* 131* 286* 164* 170*    Recent Results (from the past 240 hour(s))  Urine culture     Status: None   Collection Time: 12/22/13 10:10 AM  Result Value Ref Range Status   Specimen Description URINE, CLEAN CATCH  Final   Special Requests NONE   Final   Culture  Setup Time   Final    12/22/2013 15:33 Performed at [facility]    Colony Count   Final    9,000 COLONIES/ML Performed at [facility]    Culture   Final    INSIGNIFICANT GROWTH Performed at [facility]    Report Status 12/23/2013 FINAL  Final  Clostridium Difficile by PCR     Status: Abnormal   Collection Time: 12/22/13  6:00 PM  Result Value Ref Range Status   C difficile by pcr POSITIVE (A) NEGATIVE Final    Comment: CRITICAL RESULT CALLED TO, READ BACK BY AND VERIFIED WITH: K.WICKER,RN 12/23/13 0810 BY BSLADE      Scheduled Meds: . amLODipine  10 mg Oral Daily  . aspirin EC  81 mg Oral Daily  . atorvastatin  20 mg Oral q1800  . calcitRIOL  0.25 mcg Oral Daily  . calcium acetate  667 mg Oral TID WC  . carvedilol  12.5 mg Oral BID WC  . enoxaparin (LOVENOX) injection  50 mg Subcutaneous Q24H  . feeding supplement (NEPRO CARB STEADY)  237 mL Oral TID BM  . furosemide  80 mg Oral Daily  . insulin aspart  0-9 Units Subcutaneous TID WC  . insulin glargine  5 Units Subcutaneous QHS  . isosorbide mononitrate  60 mg Oral Daily  . metroNIDAZOLE  500 mg Oral 3 times per day  . pantoprazole  40 mg Oral Daily  . sodium bicarbonate  1,300 mg Oral BID  . sodium chloride  3 mL Intravenous Q12H  . sodium chloride  3 mL Intravenous Q12H  . warfarin  2 mg Oral ONCE-1800  . Warfarin - Pharmacist Dosing Inpatient   Does not apply q1800   Continuous Infusions:    Lorynn Moeser, DO  [facility] Pager [phone]  If 7PM-7AM, please contact night-coverage [email or web address] Password [email or web address] 12/24/2013, 3:08 PM   LOS: 2 days

## 2013-12-24 NOTE — Progress Notes (Addendum)
ANTICOAGULATION CONSULT NOTE - Follow Up Consult  Pharmacy Consult for coumadin and lovenox Indication: recent DVT  Allergies  Allergen Reactions  . Pork-Derived Conservation officer, natureroducts Hives and Other (See Comments)    The patient has tolerated lovenox    Patient Measurements: Height: 5\' 7"  (170.2 cm) Weight: 105 lb 6.1 oz (47.8 kg) IBW/kg (Calculated) : 66.1 Heparin Dosing Weight:   Vital Signs: Temp: 98.4 F (36.9 C) (11/25 0932) Temp Source: Oral (11/25 0932) BP: 177/83 mmHg (11/25 0932) Pulse Rate: 76 (11/25 0932)  Labs:  Recent Labs  12/22/13 0915 12/23/13 0537 12/24/13 0547  HGB 7.5* 10.5* 10.6*  HCT 23.9* 31.8* 32.5*  PLT 267 221 215  LABPROT 14.8 15.5* 17.3*  INR 1.14 1.22 1.40  CREATININE 3.70* 3.71* 3.84*  TROPONINI <0.30  --   --     Estimated Creatinine Clearance: 14.3 mL/min (by C-G formula based on Cr of 3.84).   Medications:  Scheduled:  . amLODipine  10 mg Oral Daily  . aspirin EC  81 mg Oral Daily  . atorvastatin  20 mg Oral q1800  . calcitRIOL  0.25 mcg Oral Daily  . calcium acetate  667 mg Oral TID WC  . carvedilol  12.5 mg Oral BID WC  . enoxaparin (LOVENOX) injection  50 mg Subcutaneous Q24H  . feeding supplement (NEPRO CARB STEADY)  237 mL Oral TID BM  . furosemide  80 mg Oral Daily  . insulin aspart  0-9 Units Subcutaneous TID WC  . insulin glargine  5 Units Subcutaneous QHS  . isosorbide mononitrate  60 mg Oral Daily  . metroNIDAZOLE  500 mg Oral 3 times per day  . pantoprazole  40 mg Oral Daily  . sodium bicarbonate  1,300 mg Oral BID  . sodium chloride  3 mL Intravenous Q12H  . sodium chloride  3 mL Intravenous Q12H  . Warfarin - Pharmacist Dosing Inpatient   Does not apply q1800   Infusions:    Assessment: 58 yo male with recent DVT RUE  is currently on subtherapeutic coumadin bridging with treatment dose of lovenox.  Patient diagnosed with DVT involving right upper extremity in September 2015. MD noted suspects subtherapeutic INR is  likely due to medication nonadherence.  INR today is 1.4, increased from 1.22.  Acute on chronic anemia/chronic normocytic anemia- transfused 2units PRBC yesterday.  CBC is stable with Hgb 10.6. Flagyl started 11/24 for Cdiff colitis.  Flagyl can increase coumadin effect.  Total body weight is less than IBW.   Height 67 in.  TBW is 47.8,  IBW = 66 kg,  BMI = 16.5 Documentation of eating 100% meals since 11/24 AM Albumin = 2.0  Lovenox bridge of 50mg  SQ q24h (1mg /kg q24h) continues until INR therapeutic. H/o CKD stage 4: SCr 3.8,  Estimated CrCl ~ 14 ml/min. Pltc 215K within normal. No overt bleeding, hemoccult trace positive in setting of Cdiff colitis. Transfused 2units PRBC on 12/22/13.    Goal of Therapy:  Anti-Xa level 0.6-1 units/ml 4hrs after LMWH dose given; INR 2-3 Monitor platelets by anticoagulation protocol: Yes   Plan:  Continues on Lovenox 50mg  SQ q24h bridge to coumadin Coumadin 2 mg po tonight  (monitor for possible drug interaction with flagyl - may increase coumadin effect).  Daily INR CBC q72h while on Lovenox Will consider checking anti-Xa level at [facility] F/u home dose of coumadin when sister arrives (awaiting home med rec)  Noah Delaineuth Chantelle Verdi, RPh Clinical Pharmacist Pager: [phone] 12/24/2013,12:23 PM

## 2013-12-24 NOTE — Plan of Care (Signed)
Problem: Phase I Progression Outcomes Goal: Initial discharge plan identified Outcome: Completed/Met Date Met:  12/24/13  Problem: Phase II Progression Outcomes Goal: Progress activity as tolerated unless otherwise ordered Outcome: Completed/Met Date Met:  12/24/13 Goal: Discharge plan established Outcome: Completed/Met Date Met:  12/24/13 Goal: Vital signs remain stable Outcome: Completed/Met Date Met:  12/24/13  Problem: Phase III Progression Outcomes Goal: Voiding independently Outcome: Completed/Met Date Met:  12/24/13 Goal: Foley discontinued Outcome: Not Applicable Date Met:  01/23/82

## 2013-12-25 LAB — GLUCOSE, CAPILLARY
GLUCOSE-CAPILLARY: 196 mg/dL — AB (ref 70–99)
Glucose-Capillary: 240 mg/dL — ABNORMAL HIGH (ref 70–99)
Glucose-Capillary: 222 mg/dL — ABNORMAL HIGH (ref 70–99)
Glucose-Capillary: 163 mg/dL — ABNORMAL HIGH (ref 70–99)

## 2013-12-25 LAB — PROTIME-INR
INR: 1.72 — ABNORMAL HIGH (ref 0.00–1.49)
PROTHROMBIN TIME: 20.4 s — AB (ref 11.6–15.2)

## 2013-12-25 MED ORDER — INSULIN GLARGINE 100 UNIT/ML ~~LOC~~ SOLN
8.0000 [IU] | Freq: Every day | SUBCUTANEOUS | Status: DC
  Administered 2013-12-25: 8 [IU] via SUBCUTANEOUS
  Filled 2013-12-25 (×2): qty 0.08

## 2013-12-25 MED ORDER — ISOSORBIDE MONONITRATE ER 60 MG PO TB24: 60.0000 mg | ORAL_TABLET | Freq: Every day | ORAL | Status: AC

## 2013-12-25 MED ORDER — CARVEDILOL 6.25 MG PO TABS
18.7500 mg | ORAL_TABLET | Freq: Two times a day (BID) | ORAL | Status: DC
  Administered 2013-12-25 – 2013-12-27 (×4): 18.75 mg via ORAL
  Filled 2013-12-25 (×7): qty 1

## 2013-12-25 MED ORDER — NEPRO/CARBSTEADY PO LIQD: 237.0000 mL | Freq: Three times a day (TID) | ORAL | Status: AC

## 2013-12-25 MED ORDER — WARFARIN SODIUM 2 MG PO TABS
2.0000 mg | ORAL_TABLET | Freq: Once | ORAL | Status: AC
  Administered 2013-12-25: 2 mg via ORAL
  Filled 2013-12-25: qty 1

## 2013-12-25 NOTE — Plan of Care (Signed)
Problem: Phase III Progression Outcomes Goal: Discharge plan remains appropriate-arrangements made Outcome: Progressing  Problem: Discharge Progression Outcomes Goal: Discharge plan in place and appropriate Outcome: Progressing Lives with sister Goal: Pain controlled with appropriate interventions Outcome: Completed/Met Date Met:  12/25/13 Goal: Hemodynamically stable Outcome: Completed/Met Date Met:  81/38/87 Goal: Complications resolved/controlled Outcome: Progressing Goal: Tolerating diet Outcome: Completed/Met Date Met:  12/25/13 Goal: Activity appropriate for discharge plan Outcome: Completed/Met Date Met:  12/25/13

## 2013-12-25 NOTE — Progress Notes (Signed)
PROGRESS NOTE  [name] [name] ZOX:[id number] DOB: 1955/10/24 DOA: 12/22/2013 PCP: [name] [name]  Assessment/Plan: Hypoglycemia -Likely due to prolonged half-life of insulin due to the patient's CKD stage IV in combination with his poor eating habits -Family at the bedside states that patient frequently goes almost 24 hours without eating at home -Increase Lantus 8 units daily -Continue NovoLog sliding scale -I have instructed the patient and family not to give his sliding scale NovoLog if he eats less than 50% of his meals -Family states that his Lantus was increased to 16 units daily since his last discharge from the hospital C diff Colitis -Continue oral metronidazole D#3 Diabetes mellitus type 2 with renal complications -Continue Lantus and monitor CBGs -Education provided regarding meals and insulin Anemia of chronic kidney disease -Patient received 2 units PRBC -Hemoglobin has remained stable -Baseline hemoglobin approximately 8 DVT right upper extremity -Diagnosed September 2015 -Continue warfarin -Subtherapeutic INR due to noncompliance -12/25/2013--discussed with the patient's sister whom stated that there is no way that she can take the patient to get his INR checked on 12/26/2013 -Due to the patient's social situation, we'll keep the patient in the hospital with the hopes that his INR continues to increase. Acute encephalopathy -Resolved -Secondary to hypoglycemia CKD stage IV -Baseline creatinine 3.6-3.9 -Continue bicarbonate Hypertension -Continue amlodipine, carvedilol, furosemide, Imdur -increase coreg to 18.75mg  bid History of stroke -Continue aspirin 81 mg daily -Continue statin -Monitor LFTs Substance abuse -Previous UDS positive for marijuana  Family Communication:   Sister updated on phone Disposition Plan:   Home when medically stable       Procedures/Studies:  No results found.      Subjective: Patient denies fevers,  chills, headache, chest pain, dyspnea, nausea, vomiting, diarrhea, abdominal pain, dysuria, hematuria   Objective: Filed Vitals:   12/24/13 1708 12/24/13 2100 12/25/13 0459 12/25/13 0615  BP: 115/62 151/80 180/85 154/75  Pulse: 70 67 75 79  Temp: 97.8 F (36.6 C) 97.5 F (36.4 C) 98.4 F (36.9 C)   TempSrc: Oral Oral Oral   Resp: 20 18 17    Height:      Weight:  50.4 kg (111 lb 1.8 oz)    SpO2: 99% 100% 98%     Intake/Output Summary (Last 24 hours) at 12/25/13 1202 Last data filed at 12/25/13 0600  Gross per 24 hour  Intake     80 ml  Output    600 ml  Net   -520 ml   Weight change:  Exam:   General:  Pt is alert, follows commands appropriately, not in acute distress  HEENT: No icterus, No thrush,  [place]/AT  Cardiovascular: RRR, S1/S2, no rubs, no gallops  Respiratory: CTA bilaterally, no wheezing, no crackles, no rhonchi  Abdomen: Soft/+BS, non tender, non distended, no guarding  Extremities: No edema, No lymphangitis, No petechiae, No rashes, no synovitis  Data Reviewed: Basic Metabolic Panel:  Recent Labs Lab 12/22/13 0915 12/23/13 0537 12/24/13 0547  NA 142 135* 135*  K 3.8 4.5 4.1  CL 104 102 102  CO2 24 21 20   GLUCOSE 126* 235* 184*  BUN 69* 63* 62*  CREATININE 3.70* 3.71* 3.84*  CALCIUM 8.1* 7.5* 8.2*   Liver Function Tests:  Recent Labs Lab 12/22/13 0915  AST 15  ALT 6  ALKPHOS 74  BILITOT 0.3  PROT 7.0  ALBUMIN 2.0*   No results for input(s): LIPASE, AMYLASE in the last 168 hours. No results for input(s): AMMONIA  in the last 168 hours. CBC:  Recent Labs Lab 12/22/13 0915 12/23/13 0537 12/24/13 0547  WBC 4.9 4.7 4.8  NEUTROABS 3.2  --   --   HGB 7.5* 10.5* 10.6*  HCT 23.9* 31.8* 32.5*  MCV 89.5 86.9 84.9  PLT 267 221 215   Cardiac Enzymes:  Recent Labs Lab 12/22/13 0915  TROPONINI <0.30   BNP: Invalid input(s): POCBNP CBG:  Recent Labs Lab 12/24/13 1148 12/24/13 1708 12/24/13 2145 12/25/13 0741 12/25/13 1132    GLUCAP 170* 207* 229* 196* 240*    Recent Results (from the past 240 hour(s))  Urine culture     Status: None   Collection Time: 12/22/13 10:10 AM  Result Value Ref Range Status   Specimen Description URINE, CLEAN CATCH  Final   Special Requests NONE  Final   Culture  Setup Time   Final    12/22/2013 15:33 Performed at [facility]    Colony Count   Final    9,000 COLONIES/ML Performed at [facility]    Culture   Final    INSIGNIFICANT GROWTH Performed at [facility]    Report Status 12/23/2013 FINAL  Final  Clostridium Difficile by PCR     Status: Abnormal   Collection Time: 12/22/13  6:00 PM  Result Value Ref Range Status   C difficile by pcr POSITIVE (A) NEGATIVE Final    Comment: CRITICAL RESULT CALLED TO, READ BACK BY AND VERIFIED WITH: K.WICKER,RN 12/23/13 0810 BY BSLADE      Scheduled Meds: . amLODipine  10 mg Oral Daily  . aspirin EC  81 mg Oral Daily  . atorvastatin  20 mg Oral q1800  . calcitRIOL  0.25 mcg Oral Daily  . calcium acetate  667 mg Oral TID WC  . carvedilol  12.5 mg Oral BID WC  . enoxaparin (LOVENOX) injection  50 mg Subcutaneous Q24H  . feeding supplement (NEPRO CARB STEADY)  237 mL Oral TID BM  . furosemide  80 mg Oral Daily  . insulin aspart  0-9 Units Subcutaneous TID WC  . insulin glargine  5 Units Subcutaneous QHS  . isosorbide mononitrate  60 mg Oral Daily  . metroNIDAZOLE  500 mg Oral 3 times per day  . pantoprazole  40 mg Oral Daily  . sodium bicarbonate  1,300 mg Oral BID  . sodium chloride  3 mL Intravenous Q12H  . sodium chloride  3 mL Intravenous Q12H  . warfarin  2 mg Oral ONCE-1800  . Warfarin - Pharmacist Dosing Inpatient   Does not apply q1800   Continuous Infusions:    [name] Alperin, DO  [facility] Pager [phone]  If 7PM-7AM, please contact night-coverage [email or web address] Password [email or web address] 12/25/2013, 12:02 PM   LOS: 3 days

## 2013-12-25 NOTE — Discharge Summary (Addendum)
Physician Discharge Summary  [name] [name] ZOX:[id number] DOB: 31-May-1955 DOA: 12/22/2013  PCP: Lonia BloodGARBA,LAWAL, MD  Admit date: 12/22/2013 Discharge date: 12/27/13 Recommendations for Outpatient Follow-up:  1. Pt will need to follow up with PCP in 2 weeks post discharge 2. Please obtain BMP and CBC in one week 3. Check INR on 12/29/13 and adjust warfarin dose accordingly for INR >2.0   Discharge Diagnoses:  Hypoglycemia -Likely due to prolonged half-life of insulin due to the patient's CKD stage IV in combination with his poor eating habits -Family at the bedside states that patient frequently goes almost 24 hours without eating at home -Increase Lantus 12 units daily -Continue NovoLog sliding scale -I have instructed the patient and family not to give his sliding scale NovoLog if he eats less than 50% of his meals C diff Colitis -Continue oral metronidazole D#5 on 12/26/2013 -The patient will go home with 5 additional days of metronidazole to complete 10 days of therapy Severe Malnutrition -seen by nutrition -Nepro shakes Diabetes mellitus type 2 with renal complications -Continue Lantus and monitor CBGs -Education provided regarding meals and insulin Anemia of chronic kidney disease -Patient received 2 units PRBC -Hemoglobin has remained stable -Baseline hemoglobin approximately 8 DVT right upper extremity -Diagnosed September 2015 -Continue warfarin with lovenox bridge during the hospitalization -Subtherapeutic INR due to noncompliance -12/25/2013--discussed with the patient's sister whom stated that there is no way that she can take the patient to get his INR checked on 12/26/2013 -Due to the patient's social situation, we'll keep the patient in the hospital with the hopes that his INR continues to increase. -Patient's sister states that she will take the patient to his primary care provider for his INR to be checked on 12/29/2013 -He received his warfarin dose and  Lovenox injection prior to his d/c on 12/27/13 -I anticipate his INR to be therapeutic in the next 24 hours; therefore, the pt was not sent home with any more lovenox--as pt wanted to go home, he was given a slightly higher dose on day of d/c Acute encephalopathy -Resolved -Secondary to hypoglycemia CKD stage IV -Baseline creatinine 3.6-3.9 -Continue bicarbonate -Serum creatinine 3.94 on the day of discharge -He will continue on his daily furosemide at home Hypertension -Continue amlodipine, carvedilol, furosemide, Imdur -increase coreg to 18.75mg  bid History of stroke -Continue aspirin 81 mg daily -Continue statin -Monitor LFTs Substance abuse -Previous UDS positive for marijuana  Family Communication: Sister updated on phone Disposition Plan: Home when medically stable  Discharge Condition: Stable  Disposition:  home  Diet: Carbohydrate modified Wt Readings from Last 3 Encounters:  12/26/13 50.4 kg (111 lb 1.8 oz)  12/08/13 46.267 kg (102 lb)  11/05/13 55.112 kg (121 lb 8 oz)    History of present illness:  58 y.o. male with an extensive past medical history including stage IV chronic kidney disease not on hemodialysis, history of DVT involving right upper extremity that was diagnosed in September 2015, had been on Coumadin therapy, insulin-dependent diabetes mellitus, who was admitted to the medicine service recently on 11/03/2013, discharged on 11/06/2013 at which time he presented with hypoglycemia. At the time he was discharged in stable condition to skilled nursing facility on Lantus 12 units subcutaneous daily with sliding scale coverage. On the day of admission, he was found unresponsive by family membersas they were trying to wake him up to get ready for a doctor's appointment. They noted him to be unresponsive, checked blood sugar which was low at 38. Called 911, EMS administered D50 with  by mouth glucose at the scene with subsequent improvement in blood sugar to  151. In the emergency department, the patient was awake and conversant. He states having generalized weakness over the past several weeks then the last several days developing diarrhea. He denies fevers, chills, bloody stools, melena, hematemesis, nausea, vomiting. Lab work in the emergency room showed a hemoglobin of 7.5 with blood sugar of 76. The patient was transfused with 2 units PRBC. His mental status returned to baseline. Education was provided regarding diabetes mellitus and insulin dosing. The patient's INR was subtherapeutic due to his poor compliance. The patient was started back on warfarin with a Lovenox bridge.  Consultants: none  Discharge Exam: Filed Vitals:   12/27/13 1050  BP: 136/85  Pulse: 76  Temp: 98.7 F (37.1 C)  Resp: 20   Filed Vitals:   12/26/13 1729 12/26/13 2052 12/27/13 0456 12/27/13 1050  BP: 144/71 152/69 147/74 136/85  Pulse: 70 79 73 76  Temp: 98.1 F (36.7 C) 98.9 F (37.2 C) 98.9 F (37.2 C) 98.7 F (37.1 C)  TempSrc: Oral Oral Oral Oral  Resp: 18 18 18 20   Height:      Weight:  50.4 kg (111 lb 1.8 oz)    SpO2: 98% 97% 97% 98%   General: A&O x 3, NAD, pleasant, cooperative Cardiovascular: RRR, no rub, no gallop, no S3 Respiratory: CTAB, no wheeze, no rhonchi Abdomen:soft, nontender, nondistended, positive bowel sounds Extremities: No edema, No lymphangitis, no petechiae  Discharge Instructions      Discharge Instructions    Diet - low sodium heart healthy    Complete by:  As directed      Increase activity slowly    Complete by:  As directed             Medication List    STOP taking these medications        amoxicillin-clavulanate 500-125 MG per tablet  Commonly known as:  AUGMENTIN      TAKE these medications        acetaminophen 325 MG tablet  Commonly known as:  TYLENOL  Take 2 tablets (650 mg total) by mouth every 6 (six) hours as needed for mild pain (or Fever >/= 101).     amLODipine 10 MG tablet  Commonly  known as:  NORVASC  Take 10 mg by mouth daily.     aspirin 81 MG tablet  Take 81 mg by mouth daily.     atorvastatin 20 MG tablet  Commonly known as:  LIPITOR  Take 1 tablet (20 mg total) by mouth daily at 6 PM.     calcitRIOL 0.25 MCG capsule  Commonly known as:  ROCALTROL  Take 1 capsule (0.25 mcg total) by mouth daily.     calcium acetate 667 MG capsule  Commonly known as:  PHOSLO  Take 667 mg by mouth 3 (three) times daily with meals.     carvedilol 6.25 MG tablet  Commonly known as:  COREG  Take 3 tablets (18.75 mg total) by mouth 2 (two) times daily with a meal.     feeding supplement (NEPRO CARB STEADY) Liqd  Take 237 mLs by mouth 3 (three) times daily between meals.     furosemide 80 MG tablet  Commonly known as:  LASIX  Take 1 tablet (80 mg total) by mouth daily.     glucose blood test strip  Use as instructed     insulin aspart 100 UNIT/ML injection  Commonly known as:  novoLOG  - Inject 0-12 Units into the skin 3 (three) times daily with meals. SSI  BS  150-200  2 u    To be given SQ  -                 201-250  4 u  -                 251-300  6 u  -                 301-350  8 u  -                  351-400  10 u  -                   > 400  12 u and call MD     insulin glargine 100 UNIT/ML injection  Commonly known as:  LANTUS  Inject 0.12 mLs (12 Units total) into the skin at bedtime.     isosorbide mononitrate 60 MG 24 hr tablet  Commonly known as:  IMDUR  Take 1 tablet (60 mg total) by mouth daily.     metroNIDAZOLE 500 MG tablet  Commonly known as:  FLAGYL  Take 1 tablet (500 mg total) by mouth every 8 (eight) hours.     ranitidine 150 MG tablet  Commonly known as:  ZANTAC  Take 150 mg by mouth daily.     sevelamer carbonate 800 MG tablet  Commonly known as:  RENVELA  Take 1,600 mg by mouth 3 (three) times daily with meals.     sodium bicarbonate 650 MG tablet  Take 2 tablets (1,300 mg total) by mouth 2 (two) times daily.     thiamine  100 MG tablet  Commonly known as:  VITAMIN B-1  Take 100 mg by mouth every morning.     traMADol 50 MG tablet  Commonly known as:  ULTRAM  Take 50 mg by mouth every 6 (six) hours as needed (pain).     warfarin 2 MG tablet  Commonly known as:  COUMADIN  Take 1 tablet (2 mg total) by mouth daily.  Start taking on:  12/28/2013         The results of significant diagnostics from this hospitalization (including imaging, microbiology, ancillary and laboratory) are listed below for reference.    Significant Diagnostic Studies: No results found.   Microbiology: Recent Results (from the past 240 hour(s))  Urine culture     Status: None   Collection Time: 12/22/13 10:10 AM  Result Value Ref Range Status   Specimen Description URINE, CLEAN CATCH  Final   Special Requests NONE  Final   Culture  Setup Time   Final    12/22/2013 15:33 Performed at [facility] Count   Final    9,000 COLONIES/ML Performed at [facility]    Culture   Final    INSIGNIFICANT GROWTH Performed at [facility]    Report Status 12/23/2013 FINAL  Final  Clostridium Difficile by PCR     Status: Abnormal   Collection Time: 12/22/13  6:00 PM  Result Value Ref Range Status   C difficile by pcr POSITIVE (A) NEGATIVE Final    Comment: CRITICAL RESULT CALLED TO, READ BACK BY AND VERIFIED WITH: K.WICKER,RN 12/23/13 0810 BY BSLADE      Labs: Basic Metabolic Panel:  Recent Labs Lab 12/22/13 0915 12/23/13 0537 12/24/13 0547 12/26/13  0500 12/27/13 0351  NA 142 135* 135* 136* 138  K 3.8 4.5 4.1 4.4 4.3  CL 104 102 102 103 104  CO2 24 21 20 19 22   GLUCOSE 126* 235* 184* 214* 143*  BUN 69* 63* 62* 63* 59*  CREATININE 3.70* 3.71* 3.84* 4.18* 3.94*  CALCIUM 8.1* 7.5* 8.2* 8.4 8.2*   Liver Function Tests:  Recent Labs Lab 12/22/13 0915  AST 15  ALT 6  ALKPHOS 74  BILITOT 0.3  PROT 7.0  ALBUMIN 2.0*   No results for input(s): LIPASE, AMYLASE in the last 168  hours. No results for input(s): AMMONIA in the last 168 hours. CBC:  Recent Labs Lab 12/22/13 0915 12/23/13 0537 12/24/13 0547 12/26/13 0500  WBC 4.9 4.7 4.8 5.0  NEUTROABS 3.2  --   --   --   HGB 7.5* 10.5* 10.6* 10.5*  HCT 23.9* 31.8* 32.5* 33.2*  MCV 89.5 86.9 84.9 86.0  PLT 267 221 215 233   Cardiac Enzymes:  Recent Labs Lab 12/22/13 0915  TROPONINI <0.30   BNP: Invalid input(s): POCBNP CBG:  Recent Labs Lab 12/26/13 1732 12/26/13 2057 12/26/13 2324 12/27/13 0739 12/27/13 1104  GLUCAP 180* 203* 145* 85 192*    Time coordinating discharge:  Greater than 30 minutes  Signed:  Bayley Hurn, DO [facility] Pager: [phone] 12/27/2013, 2:36 PM

## 2013-12-25 NOTE — Progress Notes (Signed)
ANTICOAGULATION CONSULT NOTE - Follow Up Consult  Pharmacy Consult for Coumadin and Lovenox Indication: recent DVT  Allergies  Allergen Reactions  . Pork-Derived Conservation officer, natureroducts Hives and Other (See Comments)    The patient has tolerated lovenox    Patient Measurements: Height: 5\' 7"  (170.2 cm) Weight: 111 lb 1.8 oz (50.4 kg) IBW/kg (Calculated) : 66.1  Vital Signs: Temp: 98.4 F (36.9 C) (11/26 0459) Temp Source: Oral (11/26 0459) BP: 154/75 mmHg (11/26 0615) Pulse Rate: 79 (11/26 0615)  Labs:  Recent Labs  12/22/13 0915 12/23/13 0537 12/24/13 0547 12/25/13 0500  HGB 7.5* 10.5* 10.6*  --   HCT 23.9* 31.8* 32.5*  --   PLT 267 221 215  --   LABPROT 14.8 15.5* 17.3* 20.4*  INR 1.14 1.22 1.40 1.72*  CREATININE 3.70* 3.71* 3.84*  --   TROPONINI <0.30  --   --   --     Estimated Creatinine Clearance: 15.1 mL/min (by C-G formula based on Cr of 3.84).   Assessment: 58 yo male with RUE DVT in September who continues on Lovenox to Coumadin bridge. MD noted suspects subtherapeutic INR is likely due to medication nonadherence.  INR today is 1.72 today. No bleeding noted but has acute on chronic anemia/chronic normocytic anemia- transfused 2units PRBC this admit. CBC is stable with Hgb 10.6. Flagyl started 11/24 for Cdiff colitis which can increase Coumadin effect.    Not able to confirm PTA Coumadin dose as med hx still pending.  Goal of Therapy:  Anti-Xa level 0.6-1 units/ml 4hrs after LMWH dose given  INR 2-3 Monitor platelets by anticoagulation protocol: Yes   Plan:  Continue Lovenox 50 mg SQ q24h - cancelled anti-Xa level as expect INR to be therapeutic soon Coumadin 2 mg PO tonight   Daily INR CBC q72h while on Lovenox  [facility] Orthopedic Specialty HospitalJennifer Argyle, PryorsburgPharm.D., BCPS Clinical Pharmacist Pager: [phone] 12/25/2013 8:45 AM

## 2013-12-26 DIAGNOSIS — R55 Syncope and collapse: Secondary | ICD-10-CM

## 2013-12-26 LAB — BASIC METABOLIC PANEL
ANION GAP: 14 (ref 5–15)
BUN: 63 mg/dL — ABNORMAL HIGH (ref 6–23)
CALCIUM: 8.4 mg/dL (ref 8.4–10.5)
CHLORIDE: 103 meq/L (ref 96–112)
CO2: 19 meq/L (ref 19–32)
Creatinine, Ser: 4.18 mg/dL — ABNORMAL HIGH (ref 0.50–1.35)
GFR calc Af Amer: 17 mL/min — ABNORMAL LOW (ref >90)
GFR calc non Af Amer: 14 mL/min — ABNORMAL LOW (ref >90)
GLUCOSE: 214 mg/dL — AB (ref 70–99)
POTASSIUM: 4.4 meq/L (ref 3.7–5.3)
SODIUM: 136 meq/L — AB (ref 137–147)

## 2013-12-26 LAB — CBC
HEMATOCRIT: 33.2 % — AB (ref 39.0–52.0)
HEMOGLOBIN: 10.5 g/dL — AB (ref 13.0–17.0)
MCH: 27.2 pg (ref 26.0–34.0)
MCHC: 31.6 g/dL (ref 30.0–36.0)
MCV: 86 fL (ref 78.0–100.0)
Platelets: 233 10*3/uL (ref 150–400)
RBC: 3.86 MIL/uL — AB (ref 4.22–5.81)
RDW: 15.1 % (ref 11.5–15.5)
WBC: 5 10*3/uL (ref 4.0–10.5)

## 2013-12-26 LAB — GLUCOSE, CAPILLARY
GLUCOSE-CAPILLARY: 261 mg/dL — AB (ref 70–99)
GLUCOSE-CAPILLARY: 180 mg/dL — AB (ref 70–99)
Glucose-Capillary: 190 mg/dL — ABNORMAL HIGH (ref 70–99)
Glucose-Capillary: 203 mg/dL — ABNORMAL HIGH (ref 70–99)

## 2013-12-26 LAB — PROTIME-INR
INR: 1.86 — ABNORMAL HIGH (ref 0.00–1.49)
Prothrombin Time: 21.6 seconds — ABNORMAL HIGH (ref 11.6–15.2)

## 2013-12-26 MED ORDER — SODIUM CHLORIDE 0.9 % IV SOLN
INTRAVENOUS | Status: DC
  Administered 2013-12-26: 16:00:00 via INTRAVENOUS

## 2013-12-26 MED ORDER — WARFARIN SODIUM 2 MG PO TABS
2.0000 mg | ORAL_TABLET | Freq: Once | ORAL | Status: AC
  Administered 2013-12-26: 2 mg via ORAL
  Filled 2013-12-26: qty 1

## 2013-12-26 MED ORDER — INSULIN GLARGINE 100 UNIT/ML ~~LOC~~ SOLN
12.0000 [IU] | Freq: Every day | SUBCUTANEOUS | Status: DC
  Administered 2013-12-26: 12 [IU] via SUBCUTANEOUS
  Filled 2013-12-26 (×2): qty 0.12

## 2013-12-26 NOTE — Care Management Note (Signed)
CARE MANAGEMENT NOTE 12/26/2013  Patient:  [facility]   Account Number:  [id number]  Date Initiated:  12/26/2013  Documentation initiated by:  Jakaleb Payer  Subjective/Objective Assessment:   CM following for progression and d/c planning. Pt currently plans to return to home.     Action/Plan:   12/26/2013 Met with pt , IM given , pt anxious to d/c to home.   Anticipated DC Date:  12/27/2013   Anticipated DC Plan:  [place]         Choice offered to / List presented to:             Status of service:   Medicare Important Message given?  YES (If response is "NO", the following Medicare IM given date fields will be blank) Date Medicare IM given:  12/26/2013 Medicare IM given by:  Aubrynn Katona Date Additional Medicare IM given:   Additional Medicare IM given by:    Discharge Disposition:    Per UR Regulation:    If discussed at Long Length of Stay Meetings, dates discussed:    Comments:

## 2013-12-26 NOTE — Progress Notes (Signed)
ANTICOAGULATION CONSULT NOTE - Follow Up Consult  Pharmacy Consult for Warfarin Indication: Recurrent DVT  Allergies  Allergen Reactions  . Pork-Derived Conservation officer, natureroducts Hives and Other (See Comments)    The patient has tolerated lovenox    Patient Measurements: Height: 5\' 7"  (170.2 cm) Weight: 111 lb 1.8 oz (50.4 kg) IBW/kg (Calculated) : 66.1   Vital Signs: Temp: 98.5 F (36.9 C) (11/27 0445) Temp Source: Oral (11/27 0445) BP: 158/70 mmHg (11/27 0445) Pulse Rate: 77 (11/27 0445)  Labs:  Recent Labs  12/24/13 0547 12/25/13 0500 12/26/13 0500  HGB 10.6*  --  10.5*  HCT 32.5*  --  33.2*  PLT 215  --  233  LABPROT 17.3* 20.4* 21.6*  INR 1.40 1.72* 1.86*  CREATININE 3.84*  --  4.18*    Estimated Creatinine Clearance: 13.9 mL/min (by C-G formula based on Cr of 4.18).    Assessment: [age] yoM with recent DVT in September who continues on Lovenox to Coumadin bridge. Per MD patient's sister manages his medications but she was unable to recall his home dose of warfarin - it is likely that his subacute INR is due to noncompliance.  INR today has continued to trend up nicely to 1.86. No bleeding noted but has acute on chronic anemia/chronic normocytic anemia - received 2 units PRBCs this admission. CBC is stable with Hgb 10.5. Flagyl was started on 11/24 for Cdiff colitis which may increase Coumadin's effect.  Goal of Therapy:  INR 2-3 Monitor platelets by anticoagulation protocol: Yes   Plan:  1) Lovenox 50mg  SQ q24h - anti-Xa levels cancelled as expect INR to be therapeutic soon 2) Coumadin 2 mg x1 3) Follow-up Daily INR, signs of bleeding 4) CBC q72h while on lovenox  Russ HaloAshley Lennex Pietila, PharmD Clinical Pharmacist - Resident Pager: [phone] 11/27/20159:52 AM

## 2013-12-26 NOTE — Discharge Instructions (Signed)
Information on my medicine - Coumadin   (Warfarin)  This medication education was reviewed with me or my healthcare representative as part of my discharge preparation.  The pharmacist that spoke with me during my hospital stay was:  Lexa Coronado, Suzan SlickAshley N, [facility] Michaels HospitalRPH  Why was Coumadin prescribed for you? Coumadin was prescribed for you because you have a blood clot or a medical condition that can cause an increased risk of forming blood clots. Blood clots can cause serious health problems by blocking the flow of blood to the heart, lung, or brain. Coumadin can prevent harmful blood clots from forming. As a reminder your indication for Coumadin is:   Deep Vein Thrombosis Treatment  What test will check on my response to Coumadin? While on Coumadin (warfarin) you will need to have an INR test regularly to ensure that your dose is keeping you in the desired range. The INR (international normalized ratio) number is calculated from the result of the laboratory test called prothrombin time (PT).  If an INR APPOINTMENT HAS NOT ALREADY BEEN MADE FOR YOU please schedule an appointment to have this lab work done by your health care provider within 7 days. Your INR goal is usually a number between:  2 to 3 or your provider may give you a more narrow range like 2-2.5.  Ask your health care provider during an office visit what your goal INR is.  What  do you need to  know  About  COUMADIN? Take Coumadin (warfarin) exactly as prescribed by your healthcare provider about the same time each day.  DO NOT stop taking without talking to the doctor who prescribed the medication.  Stopping without other blood clot prevention medication to take the place of Coumadin may increase your risk of developing a new clot or stroke.  Get refills before you run out.  What do you do if you miss a dose? If you miss a dose, take it as soon as you remember on the same day then continue your regularly scheduled regimen the next day.  Do not  take two doses of Coumadin at the same time.  Important Safety Information A possible side effect of Coumadin (Warfarin) is an increased risk of bleeding. You should call your healthcare provider right away if you experience any of the following: ? Bleeding from an injury or your nose that does not stop. ? Unusual colored urine (red or dark brown) or unusual colored stools (red or black). ? Unusual bruising for unknown reasons. ? A serious fall or if you hit your head (even if there is no bleeding).  Some foods or medicines interact with Coumadin (warfarin) and might alter your response to warfarin. To help avoid this: ? Eat a balanced diet, maintaining a consistent amount of Vitamin K. ? Notify your provider about major diet changes you plan to make. ? Avoid alcohol or limit your intake to 1 drink for women and 2 drinks for men per day. (1 drink is 5 oz. wine, 12 oz. beer, or 1.5 oz. liquor.)  Make sure that ANY health care provider who prescribes medication for you knows that you are taking Coumadin (warfarin).  Also make sure the healthcare provider who is monitoring your Coumadin knows when you have started a new medication including herbals and non-prescription products.  Coumadin (Warfarin)  Major Drug Interactions  Increased Warfarin Effect Decreased Warfarin Effect  Alcohol (large quantities) Antibiotics (esp. Septra/Bactrim, Flagyl, Cipro) Amiodarone (Cordarone) Aspirin (ASA) Cimetidine (Tagamet) Megestrol (Megace) NSAIDs (ibuprofen, naproxen, etc.)  Piroxicam (Feldene) °Propafenone (Rythmol SR) °Propranolol (Inderal) °Isoniazid (INH) °Posaconazole (Noxafil) Barbiturates (Phenobarbital) °Carbamazepine (Tegretol) °Chlordiazepoxide (Librium) °Cholestyramine (Questran) °Griseofulvin °Oral Contraceptives °Rifampin °Sucralfate (Carafate) °Vitamin K  ° °Coumadin® (Warfarin) Major Herbal Interactions  °Increased Warfarin Effect Decreased Warfarin Effect  °Garlic °Ginseng °Ginkgo biloba  Coenzyme Q10 °Green tea °St. John’s wort   ° °Coumadin® (Warfarin) FOOD Interactions  °Eat a consistent number of servings per week of foods HIGH in Vitamin K °(1 serving = ½ cup)  °Collards (cooked, or boiled & drained) °Kale (cooked, or boiled & drained) °Mustard greens (cooked, or boiled & drained) °Parsley *serving size only = ¼ cup °Spinach (cooked, or boiled & drained) °Swiss chard (cooked, or boiled & drained) °Turnip greens (cooked, or boiled & drained)  °Eat a consistent number of servings per week of foods MEDIUM-HIGH in Vitamin K °(1 serving = 1 cup)  °Asparagus (cooked, or boiled & drained) °Broccoli (cooked, boiled & drained, or raw & chopped) °Brussel sprouts (cooked, or boiled & drained) *serving size only = ½ cup °Lettuce, raw (green leaf, endive, romaine) °Spinach, raw °Turnip greens, raw & chopped  ° °These websites have more information on Coumadin (warfarin):  www.coumadin.com; °www.ahrq.gov/consumer/coumadin.htm; ° ° °

## 2013-12-26 NOTE — Progress Notes (Signed)
Physical Therapy Treatment Patient Details Name: [name] MostClarence [name] MRN: [id number] DOB: 1956-01-04 Today's Date: 12/26/2013    History of Present Illness Pt is a 58 y.o. 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 At the time he was discharged in stable condition to SNF. Day of admission he was found unresponsive by family members as they were trying to wake him up to get ready for a doctor's appointment. They noted him to be unresponsive, checked blood sugar which was low at 38. Called 911, EMS administered D50 with by mouth glucose at the scene with subsequent improvement in blood sugar to 151. In ED, he states having generalized weakness over the past several weeks then the last several days developing diarrhea.     PT Comments    Pt progressing towards physical therapy goals. Pt continues to complain of L knee pain during WB activity, and therapist ACE wrapped knee with reported pain relief during the following bout of ambulation. RN notified. Will continue to follow and progress as able per POC.   Follow Up Recommendations  Home health PT;Supervision for mobility/OOB     Equipment Recommendations  Rolling walker with 5" wheels    Recommendations for Other Services       Precautions / Restrictions Precautions Precautions: Fall Restrictions Weight Bearing Restrictions: No    Mobility  Bed Mobility               General bed mobility comments: Pt in recliner upon PT arrival.   Transfers Overall transfer level: Needs assistance Equipment used: Rolling walker (2 wheeled) Transfers: Sit to/from Stand Sit to Stand: Min guard         General transfer comment: Pt was able to  power-up to full standing position without assistance. Pt demonstrated proper hand placement and safety awareness.   Ambulation/Gait Ambulation/Gait assistance: Min guard;Supervision Ambulation Distance (Feet): 375 Feet Assistive device: Rolling walker (2 wheeled) Gait Pattern/deviations: Step-through pattern;Decreased stride length;Decreased dorsiflexion - left Gait velocity: Decreased Gait velocity interpretation: Below normal speed for age/gender General Gait Details: Pt was cued for increased hip flexion on L to compensate for foot drop. Pt able to make corrective changes for a short time before fatigue.    Stairs            Wheelchair Mobility    Modified Rankin (Stroke Patients Only)       Balance Overall balance assessment: History of Falls;Needs assistance Sitting-balance support: Feet supported;No upper extremity supported Sitting balance-Leahy Scale: Fair     Standing balance support: Bilateral upper extremity supported;During functional activity Standing balance-Leahy Scale: Poor Standing balance comment: Pt requires UE support for dynamic activity.                     Cognition Arousal/Alertness: Awake/alert Behavior During Therapy: WFL for tasks assessed/performed Overall Cognitive Status: Within Functional Limits for tasks assessed                      Exercises      General Comments        Pertinent Vitals/Pain Pain Assessment: Faces Faces Pain Scale: Hurts little more Pain Location: L knee Pain Intervention(s): Limited activity within patient's tolerance;Monitored during session;Other (comment) (ACE wrap  applied)    Home Living                      Prior Function            PT Goals (current goals can now be found in the care plan section) Acute Rehab PT Goals Patient Stated Goal: Be indpendent PT Goal Formulation: With patient Time For Goal Achievement: 12/31/13 Potential to Achieve Goals: Good Progress  towards PT goals: Progressing toward goals    Frequency  Min 3X/week    PT Plan Current plan remains appropriate    Co-evaluation             End of Session Equipment Utilized During Treatment: Gait belt Activity Tolerance: Patient tolerated treatment well Patient left: in chair;with call bell/phone within reach     Time: 1034-1105 PT Time Calculation (min) (ACUTE ONLY): 31 min  Charges:  $Gait Training: 8-22 mins $Therapeutic Activity: 8-22 mins                    G Codes:      Conni SlipperKirkman, Leverne Amrhein 12/26/2013, 11:17 AM  Conni SlipperLaura Stela Iwasaki, PT, DPT Acute Rehabilitation Services Pager: [phone]

## 2013-12-26 NOTE — Progress Notes (Signed)
PROGRESS NOTE  [name] MostClarence [name] UXL:[id number] DOB: 1955/05/21 DOA: 12/22/2013 PCP: Lonia BloodGARBA,LAWAL, MD  Assessment/Plan: Hypoglycemia -Likely due to prolonged half-life of insulin due to the patient's CKD stage IV in combination with his poor eating habits -Family at the bedside states that patient frequently goes almost 24 hours without eating at home -Increase Lantus 12 units daily -Continue NovoLog sliding scale -I have instructed the patient and family not to give his sliding scale NovoLog if he eats less than 50% of his meals -Family states that his Lantus was increased to 16 units daily since his last discharge from the hospital C diff Colitis -Continue oral metronidazole D#4 on 12/26/2013 -The patient will go home with 6 additional days of metronidazole to complete 10 days of therapy Diabetes mellitus type 2 with renal complications -Continue Lantus and monitor CBGs -Education provided regarding meals and insulin Anemia of chronic kidney disease -Patient received 2 units PRBC -Hemoglobin has remained stable -Baseline hemoglobin approximately 8 DVT right upper extremity -Diagnosed October 29, 2013 -Continue warfarin -Subtherapeutic INR at the time of admission -12/25/2013--discussed with the patient's sister whom stated that there is no way that she can take the patient to get his INR checked on 12/26/2013 -Due to the patient's social situation, we'll keep the patient in the hospital with the hopes that his INR reaches therapeutic level -I have discussed the risks, benefits, and alternatives of continuing on warfarin with the patient and family at the bedside--they expressed understanding and agreed to continue warfarin for at least 3 months Acute encephalopathy -Resolved -Secondary to hypoglycemia CKD stage IV -Baseline creatinine 3.6-3.9 -Continue bicarbonate --Has left AVF in place w/ thrill palpable  -12/27/13--give pt 1L of NS as creatinine is trending up and  he continues to have loose stool Hypertension -Continue amlodipine, carvedilol, furosemide, Imdur -increase coreg to 18.75mg  bid History of stroke -Continue aspirin 81 mg daily -Continue statin  -Monitor LFTs Substance abuse -Previous UDS positive for marijuana  Family Communication: Sister updated at bedside; total time 35min, >50% spent counseling and coordinating care Disposition Plan:Home 12/27/13 if stable       Procedures/Studies:  No results found.      Subjective: Patient was unhappy with his lunch tray that he was not able to get ketchup.  Patient denies fevers, chills, headache, chest pain, dyspnea, nausea, vomiting, diarrhea, abdominal pain, dysuria, hematuria   Objective: Filed Vitals:   12/25/13 1000 12/25/13 2104 12/26/13 0445 12/26/13 0950  BP: 166/79 132/64 158/70 178/78  Pulse: 80 68 77 79  Temp: 98 F (36.7 C) 98 F (36.7 C) 98.5 F (36.9 C) 98.9 F (37.2 C)  TempSrc: Oral Oral Oral Oral  Resp:  18 18 18   Height:      Weight:  50.4 kg (111 lb 1.8 oz)    SpO2: 96% 99% 98% 98%    Intake/Output Summary (Last 24 hours) at 12/26/13 1429 Last data filed at 12/26/13 0900  Gross per 24 hour  Intake    600 ml  Output   1225 ml  Net   -625 ml   Weight change: 0 kg (0 oz) Exam:   General:  Pt is alert, follows commands appropriately, not in acute distress  HEENT: No icterus, No thrush, [place]/AT  Cardiovascular: RRR, S1/S2, no rubs, no gallops  Respiratory: CTA bilaterally, no wheezing, no crackles, no rhonchi  Abdomen: Soft/+BS, non tender, non distended, no guarding  Extremities: No edema, No lymphangitis, No petechiae, No rashes, no  synovitis  Data Reviewed: Basic Metabolic Panel:  Recent Labs Lab 12/22/13 0915 12/23/13 0537 12/24/13 0547 12/26/13 0500  NA 142 135* 135* 136*  K 3.8 4.5 4.1 4.4  CL 104 102 102 103  CO2 24 21 20 19   GLUCOSE 126* 235* 184* 214*  BUN 69* 63* 62* 63*  CREATININE 3.70* 3.71* 3.84* 4.18*    CALCIUM 8.1* 7.5* 8.2* 8.4   Liver Function Tests:  Recent Labs Lab 12/22/13 0915  AST 15  ALT 6  ALKPHOS 74  BILITOT 0.3  PROT 7.0  ALBUMIN 2.0*   No results for input(s): LIPASE, AMYLASE in the last 168 hours. No results for input(s): AMMONIA in the last 168 hours. CBC:  Recent Labs Lab 12/22/13 0915 12/23/13 0537 12/24/13 0547 12/26/13 0500  WBC 4.9 4.7 4.8 5.0  NEUTROABS 3.2  --   --   --   HGB 7.5* 10.5* 10.6* 10.5*  HCT 23.9* 31.8* 32.5* 33.2*  MCV 89.5 86.9 84.9 86.0  PLT 267 221 215 233   Cardiac Enzymes:  Recent Labs Lab 12/22/13 0915  TROPONINI <0.30   BNP: Invalid input(s): POCBNP CBG:  Recent Labs Lab 12/25/13 1132 12/25/13 1659 12/25/13 2103 12/26/13 0803 12/26/13 1144  GLUCAP 240* 222* 163* 190* 261*    Recent Results (from the past 240 hour(s))  Urine culture     Status: None   Collection Time: 12/22/13 10:10 AM  Result Value Ref Range Status   Specimen Description URINE, CLEAN CATCH  Final   Special Requests NONE  Final   Culture  Setup Time   Final    12/22/2013 15:33 Performed at [facility]    Colony Count   Final    9,000 COLONIES/ML Performed at [facility]    Culture   Final    INSIGNIFICANT GROWTH Performed at [facility]    Report Status 12/23/2013 FINAL  Final  Clostridium Difficile by PCR     Status: Abnormal   Collection Time: 12/22/13  6:00 PM  Result Value Ref Range Status   C difficile by pcr POSITIVE (A) NEGATIVE Final    Comment: CRITICAL RESULT CALLED TO, READ BACK BY AND VERIFIED WITH: K.WICKER,RN 12/23/13 0810 BY BSLADE      Scheduled Meds: . amLODipine  10 mg Oral Daily  . aspirin EC  81 mg Oral Daily  . atorvastatin  20 mg Oral q1800  . calcitRIOL  0.25 mcg Oral Daily  . calcium acetate  667 mg Oral TID WC  . carvedilol  18.75 mg Oral BID WC  . enoxaparin (LOVENOX) injection  50 mg Subcutaneous Q24H  . feeding supplement (NEPRO CARB STEADY)  237 mL Oral TID BM  .  furosemide  80 mg Oral Daily  . insulin aspart  0-9 Units Subcutaneous TID WC  . insulin glargine  12 Units Subcutaneous QHS  . isosorbide mononitrate  60 mg Oral Daily  . metroNIDAZOLE  500 mg Oral 3 times per day  . pantoprazole  40 mg Oral Daily  . sodium bicarbonate  1,300 mg Oral BID  . sodium chloride  3 mL Intravenous Q12H  . sodium chloride  3 mL Intravenous Q12H  . warfarin  2 mg Oral ONCE-1800  . Warfarin - Pharmacist Dosing Inpatient   Does not apply q1800   Continuous Infusions: . sodium chloride       Zalea Pete, DO  [facility] Pager [phone]  If 7PM-7AM, please contact night-coverage [email or web address] Password [email or web address] 12/26/2013, 2:29 PM  LOS: 4 days

## 2013-12-27 LAB — BASIC METABOLIC PANEL
ANION GAP: 12 (ref 5–15)
BUN: 59 mg/dL — ABNORMAL HIGH (ref 6–23)
CALCIUM: 8.2 mg/dL — AB (ref 8.4–10.5)
CO2: 22 mEq/L (ref 19–32)
Chloride: 104 mEq/L (ref 96–112)
Creatinine, Ser: 3.94 mg/dL — ABNORMAL HIGH (ref 0.50–1.35)
GFR calc Af Amer: 18 mL/min — ABNORMAL LOW (ref >90)
GFR, EST NON AFRICAN AMERICAN: 16 mL/min — AB (ref >90)
Glucose, Bld: 143 mg/dL — ABNORMAL HIGH (ref 70–99)
Potassium: 4.3 mEq/L (ref 3.7–5.3)
SODIUM: 138 meq/L (ref 137–147)

## 2013-12-27 LAB — GLUCOSE, CAPILLARY
GLUCOSE-CAPILLARY: 145 mg/dL — AB (ref 70–99)
GLUCOSE-CAPILLARY: 85 mg/dL (ref 70–99)
Glucose-Capillary: 192 mg/dL — ABNORMAL HIGH (ref 70–99)

## 2013-12-27 LAB — PROTIME-INR
INR: 1.9 — AB (ref 0.00–1.49)
Prothrombin Time: 22 seconds — ABNORMAL HIGH (ref 11.6–15.2)

## 2013-12-27 MED ORDER — METRONIDAZOLE 500 MG PO TABS: 500.0000 mg | ORAL_TABLET | Freq: Three times a day (TID) | ORAL | Status: DC

## 2013-12-27 MED ORDER — WARFARIN SODIUM 3 MG PO TABS
3.0000 mg | ORAL_TABLET | ORAL | Status: AC
  Administered 2013-12-27: 3 mg via ORAL
  Filled 2013-12-27: qty 1

## 2013-12-27 MED ORDER — CARVEDILOL 6.25 MG PO TABS: 18.7500 mg | ORAL_TABLET | Freq: Two times a day (BID) | ORAL | Status: AC

## 2013-12-27 MED ORDER — WARFARIN SODIUM 2 MG PO TABS: 2.0000 mg | ORAL_TABLET | Freq: Every day | ORAL | Status: DC

## 2013-12-27 NOTE — Plan of Care (Signed)
Problem: Phase III Progression Outcomes Goal: IV/normal saline lock discontinued Outcome: Completed/Met Date Met:  12/27/13

## 2013-12-27 NOTE — Progress Notes (Signed)
Pt provided with discharge instructions detailing activity levels, diet, follow up appointments, and medications. Extra time spent instructing patient and family member on insulin coverage and Lantus dosage. Pt verbalized understanding. IV was dc'd. VSS. Pt escorted out by NT. Carrie MewJasmine Reinette Cuneo, RN

## 2013-12-27 NOTE — Plan of Care (Signed)
Problem: Phase III Progression Outcomes Goal: Discharge plan remains appropriate-arrangements made Outcome: Completed/Met Date Met:  12/27/13

## 2013-12-29 LAB — GI PATHOGEN PANEL BY PCR, STOOL
C DIFFICILE TOXIN A/B: POSITIVE
CRYPTOSPORIDIUM BY PCR: NEGATIVE
Campylobacter by PCR: NEGATIVE
E COLI (ETEC) LT/ST: NEGATIVE
E COLI (STEC): NEGATIVE
E COLI 0157 BY PCR: NEGATIVE
G lamblia by PCR: NEGATIVE
Norovirus GI/GII: NEGATIVE
Rotavirus A by PCR: NEGATIVE
Salmonella by PCR: NEGATIVE
Shigella by PCR: NEGATIVE

## 2013-12-31 ENCOUNTER — Encounter (INDEPENDENT_AMBULATORY_CARE_PROVIDER_SITE_OTHER): Payer: Medicare Other | Admitting: Ophthalmology

## 2013-12-31 DIAGNOSIS — H35033 Hypertensive retinopathy, bilateral: Secondary | ICD-10-CM | POA: Diagnosis not present

## 2013-12-31 DIAGNOSIS — H43813 Vitreous degeneration, bilateral: Secondary | ICD-10-CM | POA: Diagnosis not present

## 2013-12-31 DIAGNOSIS — E11359 Type 2 diabetes mellitus with proliferative diabetic retinopathy without macular edema: Secondary | ICD-10-CM | POA: Diagnosis not present

## 2013-12-31 DIAGNOSIS — I10 Essential (primary) hypertension: Secondary | ICD-10-CM | POA: Diagnosis not present

## 2013-12-31 DIAGNOSIS — E11319 Type 2 diabetes mellitus with unspecified diabetic retinopathy without macular edema: Secondary | ICD-10-CM

## 2014-01-06 ENCOUNTER — Encounter (HOSPITAL_COMMUNITY): Payer: Self-pay | Admitting: Physical Medicine and Rehabilitation

## 2014-01-06 ENCOUNTER — Emergency Department (HOSPITAL_COMMUNITY)
Admission: EM | Admit: 2014-01-06 | Discharge: 2014-01-06 | Disposition: A | Discharge status: Home / Self Care | Payer: Medicare Other | Attending: Emergency Medicine | Admitting: Emergency Medicine

## 2014-01-06 DIAGNOSIS — Z79899 Other long term (current) drug therapy: Secondary | ICD-10-CM | POA: Diagnosis not present

## 2014-01-06 DIAGNOSIS — Z8669 Personal history of other diseases of the nervous system and sense organs: Secondary | ICD-10-CM | POA: Insufficient documentation

## 2014-01-06 DIAGNOSIS — Z86718 Personal history of other venous thrombosis and embolism: Secondary | ICD-10-CM | POA: Insufficient documentation

## 2014-01-06 DIAGNOSIS — Z7982 Long term (current) use of aspirin: Secondary | ICD-10-CM | POA: Diagnosis not present

## 2014-01-06 DIAGNOSIS — Z7901 Long term (current) use of anticoagulants: Secondary | ICD-10-CM | POA: Insufficient documentation

## 2014-01-06 DIAGNOSIS — I129 Hypertensive chronic kidney disease with stage 1 through stage 4 chronic kidney disease, or unspecified chronic kidney disease: Secondary | ICD-10-CM | POA: Insufficient documentation

## 2014-01-06 DIAGNOSIS — Z720 Tobacco use: Secondary | ICD-10-CM | POA: Insufficient documentation

## 2014-01-06 DIAGNOSIS — M1990 Unspecified osteoarthritis, unspecified site: Secondary | ICD-10-CM | POA: Insufficient documentation

## 2014-01-06 DIAGNOSIS — Z8673 Personal history of transient ischemic attack (TIA), and cerebral infarction without residual deficits: Secondary | ICD-10-CM | POA: Insufficient documentation

## 2014-01-06 DIAGNOSIS — I509 Heart failure, unspecified: Secondary | ICD-10-CM | POA: Diagnosis not present

## 2014-01-06 DIAGNOSIS — N184 Chronic kidney disease, stage 4 (severe): Secondary | ICD-10-CM | POA: Insufficient documentation

## 2014-01-06 DIAGNOSIS — Z8611 Personal history of tuberculosis: Secondary | ICD-10-CM | POA: Insufficient documentation

## 2014-01-06 DIAGNOSIS — R5383 Other fatigue: Secondary | ICD-10-CM | POA: Diagnosis not present

## 2014-01-06 DIAGNOSIS — Z8619 Personal history of other infectious and parasitic diseases: Secondary | ICD-10-CM | POA: Diagnosis not present

## 2014-01-06 DIAGNOSIS — Z9119 Patient's noncompliance with other medical treatment and regimen: Secondary | ICD-10-CM | POA: Insufficient documentation

## 2014-01-06 DIAGNOSIS — Z794 Long term (current) use of insulin: Secondary | ICD-10-CM | POA: Insufficient documentation

## 2014-01-06 DIAGNOSIS — R6889 Other general symptoms and signs: Secondary | ICD-10-CM | POA: Diagnosis present

## 2014-01-06 DIAGNOSIS — E119 Type 2 diabetes mellitus without complications: Secondary | ICD-10-CM | POA: Insufficient documentation

## 2014-01-06 LAB — CBC WITH DIFFERENTIAL/PLATELET
Basophils Absolute: 0 10*3/uL (ref 0.0–0.1)
Basophils Relative: 1 % (ref 0–1)
EOS ABS: 0.1 10*3/uL (ref 0.0–0.7)
EOS PCT: 1 % (ref 0–5)
HEMATOCRIT: 32.5 % — AB (ref 39.0–52.0)
Hemoglobin: 10.2 g/dL — ABNORMAL LOW (ref 13.0–17.0)
LYMPHS PCT: 27 % (ref 12–46)
Lymphs Abs: 1.6 10*3/uL (ref 0.7–4.0)
MCH: 27.6 pg (ref 26.0–34.0)
MCHC: 31.4 g/dL (ref 30.0–36.0)
MCV: 88.1 fL (ref 78.0–100.0)
MONO ABS: 0.4 10*3/uL (ref 0.1–1.0)
Monocytes Relative: 7 % (ref 3–12)
Neutro Abs: 3.8 10*3/uL (ref 1.7–7.7)
Neutrophils Relative %: 64 % (ref 43–77)
PLATELETS: 304 10*3/uL (ref 150–400)
RBC: 3.69 MIL/uL — ABNORMAL LOW (ref 4.22–5.81)
RDW: 15.4 % (ref 11.5–15.5)
WBC: 5.9 10*3/uL (ref 4.0–10.5)

## 2014-01-06 LAB — PROTIME-INR
INR: 1.51 — ABNORMAL HIGH (ref 0.00–1.49)
Prothrombin Time: 18.4 seconds — ABNORMAL HIGH (ref 11.6–15.2)

## 2014-01-06 LAB — COMPREHENSIVE METABOLIC PANEL
ALT: 9 U/L (ref 0–53)
AST: 19 U/L (ref 0–37)
Albumin: 2.3 g/dL — ABNORMAL LOW (ref 3.5–5.2)
Alkaline Phosphatase: 87 U/L (ref 39–117)
Anion gap: 10 (ref 5–15)
BUN: 44 mg/dL — ABNORMAL HIGH (ref 6–23)
CALCIUM: 8.6 mg/dL (ref 8.4–10.5)
CO2: 24 meq/L (ref 19–32)
CREATININE: 3.55 mg/dL — AB (ref 0.50–1.35)
Chloride: 109 mEq/L (ref 96–112)
GFR calc non Af Amer: 18 mL/min — ABNORMAL LOW (ref >90)
GFR, EST AFRICAN AMERICAN: 20 mL/min — AB (ref >90)
GLUCOSE: 123 mg/dL — AB (ref 70–99)
Potassium: 4.3 mEq/L (ref 3.7–5.3)
SODIUM: 143 meq/L (ref 137–147)
TOTAL PROTEIN: 7.8 g/dL (ref 6.0–8.3)
Total Bilirubin: 0.4 mg/dL (ref 0.3–1.2)

## 2014-01-06 NOTE — ED Provider Notes (Signed)
CSN: [id number]     Arrival date & time 01/06/14  1156 History   First MD Initiated Contact with Patient 01/06/14 1219     Chief Complaint  Patient presents with  . Abnormal Lab     (Consider location/radiation/quality/duration/timing/severity/associated sxs/prior Treatment) HPI Comments: 58 y/o male with a past medical history of type 2 diabetes, hepatitis C, CHF, hypertension, chronic kidney disease, DVT and seizures presenting after being told by his PCP that he had an abnormal lab value. Patient was told that his INR was too high. He was taken off of Coumadin 8 days ago which he was on for a DVT that was found about 3 months ago. On his return lab check, his PCP stated that the INR was "off the charts" and he needed to go to the emergency department. Patient is denying any complaints at this time, however his family members her stating that he has been acting more tired than normal and occasional bilateral lower extremity edema. No increased bruising or bleeding. INR check noted in computer on 11/29 was 1.9.    The history is provided by the patient and a relative.    Past Medical History  Diagnosis Date  . Hypertension   . CHF (congestive heart failure)   . Seizures   . Tuberculosis 1960's    "got the tx for it" (10/27/2013)  . Type II diabetes mellitus   . Hepatitis C   . Stroke 06/2013    "LLE paralyzed since" (10/27/2013)  . Arthritis     "left knee hurts" (10/27/2013)  . Chronic kidney disease (CKD), stage IV (severe)   . Hypoglycemia 11/03/2013  . Brittle diabetes   . DVT (deep venous thrombosis)   . Hx of medication noncompliance    Past Surgical History  Procedure Laterality Date  . Av fistula placement Left 09/11/2013    Procedure: Insertion of arteriovenous gortex graft;  Surgeon: Chuck Hinthristopher S Dickson, MD;  [facility];  Service: Vascular;  Laterality: Left;  . Laceration repair Left ?2014    "cut my leg falling off a truck"  . Loop recorder implant  05/2012    Family History  Problem Relation Age of Onset  . COPD Mother   . Hypertension Mother   . Diabetes Mother    History  Substance Use Topics  . Smoking status: Current Every Day Smoker -- 0.25 packs/day for 42 years    Types: Cigarettes  . Smokeless tobacco: Never Used  . Alcohol Use: No    Review of Systems  10 Systems reviewed and are negative for acute change except as noted in the HPI.  Allergies  Pork-derived products  Home Medications   Prior to Admission medications   Medication Sig Start Date End Date Taking? Authorizing Provider  carvedilol (COREG) 6.25 MG tablet Take 3 tablets (18.75 mg total) by mouth 2 (two) times daily with a meal. 12/27/13  Yes Catarina Hartshornavid Tat, MD  warfarin (COUMADIN) 2 MG tablet Take 1 tablet (2 mg total) by mouth daily. 12/28/13  Yes Catarina Hartshornavid Tat, MD  acetaminophen (TYLENOL) 325 MG tablet Take 2 tablets (650 mg total) by mouth every 6 (six) hours as needed for mild pain (or Fever >/= 101). 11/06/13   Tora KindredMarianne L York, PA-C  amLODipine (NORVASC) 10 MG tablet Take 10 mg by mouth daily.    Historical Provider, MD  aspirin 81 MG tablet Take 81 mg by mouth daily.    Historical Provider, MD  atorvastatin (LIPITOR) 20 MG tablet Take 1 tablet (  20 mg total) by mouth daily at 6 PM. 11/06/13   Stephani PoliceMarianne L York, PA-C  calcitRIOL (ROCALTROL) 0.25 MCG capsule Take 1 capsule (0.25 mcg total) by mouth daily. 09/12/13   Zannie CovePreetha Joseph, MD  calcium acetate (PHOSLO) 667 MG capsule Take 667 mg by mouth 3 (three) times daily with meals.    Historical Provider, MD  furosemide (LASIX) 80 MG tablet Take 1 tablet (80 mg total) by mouth daily. 09/12/13   Zannie CovePreetha Joseph, MD  glucose blood test strip Use as instructed 11/01/13   Courtney A Forcucci, PA-C  insulin aspart (NOVOLOG) 100 UNIT/ML injection Inject 0-12 Units into the skin 3 (three) times daily with meals. SSI  BS  150-200  2 u    To be given SQ                 201-250  4 u                 251-300  6 u                 301-350  8  u                  351-400  10 u                   > 400  12 u and call MD 11/06/13   Stephani PoliceMarianne L York, PA-C  insulin glargine (LANTUS) 100 UNIT/ML injection Inject 0.12 mLs (12 Units total) into the skin at bedtime. 11/06/13   Tora KindredMarianne L York, PA-C  isosorbide mononitrate (IMDUR) 60 MG 24 hr tablet Take 1 tablet (60 mg total) by mouth daily. 12/25/13   Catarina Hartshornavid Tat, MD  metroNIDAZOLE (FLAGYL) 500 MG tablet Take 1 tablet (500 mg total) by mouth every 8 (eight) hours. 12/27/13   Catarina Hartshornavid Tat, MD  Nutritional Supplements (FEEDING SUPPLEMENT, NEPRO CARB STEADY,) LIQD Take 237 mLs by mouth 3 (three) times daily between meals. 12/25/13   Catarina Hartshornavid Tat, MD  ranitidine (ZANTAC) 150 MG tablet Take 150 mg by mouth daily.    Historical Provider, MD  sevelamer carbonate (RENVELA) 800 MG tablet Take 1,600 mg by mouth 3 (three) times daily with meals.    Historical Provider, MD  sodium bicarbonate 650 MG tablet Take 2 tablets (1,300 mg total) by mouth 2 (two) times daily. 10/29/13   Christiane Haorinna L Sullivan, MD  thiamine (VITAMIN B-1) 100 MG tablet Take 100 mg by mouth every morning.    Historical Provider, MD  traMADol (ULTRAM) 50 MG tablet Take 50 mg by mouth every 6 (six) hours as needed (pain).    Historical Provider, MD   BP 185/85 mmHg  Pulse 76  Temp(Src) 97.4 F (36.3 C) (Oral)  Resp 20  Ht 5\' 7"  (1.702 m)  Wt 120 lb (54.432 kg)  BMI 18.79 kg/m2  SpO2 94% Physical Exam  Constitutional: He is oriented to person, place, and time. He appears well-developed and well-nourished. No distress.  HENT:  Head: Normocephalic and atraumatic.  Mouth/Throat: Oropharynx is clear and moist.  Eyes: Conjunctivae and EOM are normal. Pupils are equal, round, and reactive to light.  Neck: Normal range of motion. Neck supple. No JVD present.  Cardiovascular: Normal rate, regular rhythm, normal heart sounds and intact distal pulses.   No extremity edema.  Pulmonary/Chest: Effort normal and breath sounds normal. No respiratory  distress.  Abdominal: Soft. Bowel sounds are normal. There is no tenderness.  Musculoskeletal: Normal range of motion. He  exhibits no edema.  Neurological: He is alert and oriented to person, place, and time. He has normal strength. No sensory deficit.  Speech fluent, goal oriented. Moves limbs without ataxia. Equal grip strength bilateral.  Skin: Skin is warm and dry. He is not diaphoretic.  Psychiatric: He has a normal mood and affect. His behavior is normal.  Nursing note and vitals reviewed.   ED Course  Procedures (including critical care time) Labs Review Labs Reviewed  PROTIME-INR - Abnormal; Notable for the following:    Prothrombin Time 18.4 (*)    INR 1.51 (*)    All other components within normal limits  CBC WITH DIFFERENTIAL - Abnormal; Notable for the following:    RBC 3.69 (*)    Hemoglobin 10.2 (*)    HCT 32.5 (*)    All other components within normal limits  COMPREHENSIVE METABOLIC PANEL - Abnormal; Notable for the following:    Glucose, Bld 123 (*)    BUN 44 (*)    Creatinine, Ser 3.55 (*)    Albumin 2.3 (*)    GFR calc non Af Amer 18 (*)    GFR calc Af Amer 20 (*)    All other components within normal limits    Imaging Review No results found.   EKG Interpretation None      MDM   Final diagnoses:  Other fatigue   Pt in NAD. AFVSS. Labs at baseline. PT/INR WNL. I spoke with Dr. Maxwell Caul office regarding their concern for sending him to the ED, and state there is possibly something wrong with their machine giving poor readings of "high". A follow up appt was made for Monday 12/14 at 1:30 PM for patient to see Dr. Mikeal Hawthorne. He is stable for d/c. Return precautions given. Patient states understanding of treatment care plan and is agreeable.  Discussed with attending Dr. Donnald Garre who agrees with plan of care.   Kathrynn Speed, PA-C 01/06/14 1353  Arby Barrette, MD 01/06/14 1630

## 2014-01-06 NOTE — ED Notes (Addendum)
Pt presents to department for evaluation of abnormal labwork. Elevated PT/INR level per PCP office, was taken off Coumadin last week, pt referred to ED for treatment. Pt is alert and oriented x4. Denies pain.

## 2014-01-06 NOTE — Discharge Instructions (Signed)
Your INR today was 1.51 with a PT of 18.4.  Fatigue Fatigue is a feeling of tiredness, lack of energy, lack of motivation, or feeling tired all the time. Having enough rest, good nutrition, and reducing stress will normally reduce fatigue. Consult your caregiver if it persists. The nature of your fatigue will help your caregiver to find out its cause. The treatment is based on the cause.  CAUSES  There are many causes for fatigue. Most of the time, fatigue can be traced to one or more of your habits or routines. Most causes fit into one or more of three general areas. They are: Lifestyle problems  Sleep disturbances.  Overwork.  Physical exertion.  Unhealthy habits.  Poor eating habits or eating disorders.  Alcohol and/or drug use .  Lack of proper nutrition (malnutrition). Psychological problems  Stress and/or anxiety problems.  Depression.  Grief.  Boredom. Medical Problems or Conditions  Anemia.  Pregnancy.  Thyroid gland problems.  Recovery from major surgery.  Continuous pain.  Emphysema or asthma that is not well controlled  Allergic conditions.  Diabetes.  Infections (such as mononucleosis).  Obesity.  Sleep disorders, such as sleep apnea.  Heart failure or other heart-related problems.  Cancer.  Kidney disease.  Liver disease.  Effects of certain medicines such as antihistamines, cough and cold remedies, prescription pain medicines, heart and blood pressure medicines, drugs used for treatment of cancer, and some antidepressants. SYMPTOMS  The symptoms of fatigue include:   Lack of energy.  Lack of drive (motivation).  Drowsiness.  Feeling of indifference to the surroundings. DIAGNOSIS  The details of how you feel help guide your caregiver in finding out what is causing the fatigue. You will be asked about your present and past health condition. It is important to review all medicines that you take, including prescription and  non-prescription items. A thorough exam will be done. You will be questioned about your feelings, habits, and normal lifestyle. Your caregiver may suggest blood tests, urine tests, or other tests to look for common medical causes of fatigue.  TREATMENT  Fatigue is treated by correcting the underlying cause. For example, if you have continuous pain or depression, treating these causes will improve how you feel. Similarly, adjusting the dose of certain medicines will help in reducing fatigue.  HOME CARE INSTRUCTIONS   Try to get the required amount of good sleep every night.  Eat a healthy and nutritious diet, and drink enough water throughout the day.  Practice ways of relaxing (including yoga or meditation).  Exercise regularly.  Make plans to change situations that cause stress. Act on those plans so that stresses decrease over time. Keep your work and personal routine reasonable.  Avoid street drugs and minimize use of alcohol.  Start taking a daily multivitamin after consulting your caregiver. SEEK MEDICAL CARE IF:   You have persistent tiredness, which cannot be accounted for.  You have fever.  You have unintentional weight loss.  You have headaches.  You have disturbed sleep throughout the night.  You are feeling sad.  You have constipation.  You have dry skin.  You have gained weight.  You are taking any new or different medicines that you suspect are causing fatigue.  You are unable to sleep at night.  You develop any unusual swelling of your legs or other parts of your body. SEEK IMMEDIATE MEDICAL CARE IF:   You are feeling confused.  Your vision is blurred.  You feel faint or pass out.  You develop severe headache.  You develop severe abdominal, pelvic, or back pain.  You develop chest pain, shortness of breath, or an irregular or fast heartbeat.  You are unable to pass a normal amount of urine.  You develop abnormal bleeding such as bleeding from  the rectum or you vomit blood.  You have thoughts about harming yourself or committing suicide.  You are worried that you might harm someone else. MAKE SURE YOU:   Understand these instructions.  Will watch your condition.  Will get help right away if you are not doing well or get worse. Document Released: 11/13/2006 Document Revised: 04/10/2011 Document Reviewed: 05/20/2013 [facility] Patient Information 2015 [place], [place]. This information is not intended to replace advice given to you by your health care provider. Make sure you discuss any questions you have with your health care provider. Prothrombin Time, International Normalized Ratio A Prothrombin Time (Protime, PT) measures the time that it takes for your blood to clot. The International Normalized Ratio (INR) is a calculation of blood clotting time based upon your PT result. Most laboratories report both PT and INR values when reporting blood clotting times. The PT is used to determine:  Certain medical conditions that can cause bleeding disorders. These can include:  Liver disease.  Systemic infection (sepsis).  Inherited (genetic) bleeding disorders.  The appropriate dose of warfarin (Coumadin) needed to treat various conditions that require the use of a blood thinner (anticoagulant). There is no standard warfarin dose. Each person is unique and will require their own warfarin dose based on PT and INR levels. An INR should only be used to determine the appropriate dose of an anticoagulant. It is important to know that:  PT and INR levels are checked per your caregiver's recommendations. It is very important to keep your PT and INR lab appointments. Not keeping the appointments could result in a chronic or permanent injury, pain, and disability. If there is any problem keeping the appointment, you must call your caregiver.  PT and INR levels can be affected by certain foods you eat. It is important to notify your caregiver  if you have a change in your diet.  PT and INR levels can be affected by some medications. It is important when taking new prescriptions or over-the-counter medications that you notify your caregiver.  Different medical conditions may require different PT and INR levels. For example, a mechanical heart valve may require a higher INR level than those being treated for a clot in the leg. PREPARATION FOR TEST A blood sample is obtained by inserting a needle into a vein in the arm. If you are on warfarin therapy, the blood sample should be obtained before taking your daily dose. NORMAL FINDINGS  A normal PT is 10 to 13 seconds.  A normal INR level (without anticoagulant therapy) is 0.9 to 1.1.  INR levels (with anticoagulant therapy) generally range from 2 to 3.5 depending on the medical condition that warfarin is being used to treat.  INR levels greater than 3.5 mean that it is taking too long to form a blood clot. Ranges for normal findings may vary among different laboratories and hospitals. You should always check with your doctor after having lab work or other tests done to discuss the meaning of your test results and whether your values are considered within normal limits. MEANING OF TEST  Your caregiver will go over the test results with you and discuss the importance and meaning of your results, as well as treatment  options and the need for additional tests if necessary. OBTAINING THE TEST RESULTS It is your responsibility to obtain your test results. Ask the lab or department performing the test when and how you will get your results. SEEK IMMEDIATE MEDICAL CARE IF:   You develop bleeding from the nose, mouth, or gums.  You vomit or cough up blood.  Your bowel movements are bloody or black in color.  You have a severe headache that does not go away.  You skin bruises easily. Document Released: 02/19/2004 Document Revised: 04/10/2011 Document Reviewed: 12/29/2007 [facility]  Patient Information 2015 [place]. This information is not intended to replace advice given to you by your health care provider. Make sure you discuss any questions you have with your health care provider.

## 2014-01-06 NOTE — ED Notes (Signed)
Phlebotomy at bedside.

## 2014-01-06 NOTE — ED Notes (Addendum)
Pt has no complaints of any pain. Denies SOB, chest pain, headache.Family reports he seems tired all the time and has had bilateral feet swelling, mild swelling noted in feet at this time.

## 2014-01-06 NOTE — ED Notes (Signed)
PT ambulated with baseline gait; VSS; A&Ox3; no signs of distress; respirations even and unlabored; skin warm and dry; no questions upon discharge.  

## 2014-01-14 ENCOUNTER — Emergency Department (HOSPITAL_COMMUNITY): Payer: Medicare Other

## 2014-01-14 ENCOUNTER — Inpatient Hospital Stay (HOSPITAL_COMMUNITY)
Admission: EM | Admit: 2014-01-14 | Discharge: 2014-01-18 | DRG: 637 | Disposition: A | Discharge status: Skilled Nursing Facility | Payer: Medicare Other | Attending: Internal Medicine | Admitting: Internal Medicine

## 2014-01-14 ENCOUNTER — Encounter (HOSPITAL_COMMUNITY): Payer: Self-pay | Admitting: Physical Medicine and Rehabilitation

## 2014-01-14 ENCOUNTER — Inpatient Hospital Stay (HOSPITAL_COMMUNITY): Payer: Medicare Other

## 2014-01-14 DIAGNOSIS — R4189 Other symptoms and signs involving cognitive functions and awareness: Secondary | ICD-10-CM | POA: Diagnosis present

## 2014-01-14 DIAGNOSIS — I509 Heart failure, unspecified: Secondary | ICD-10-CM | POA: Diagnosis present

## 2014-01-14 DIAGNOSIS — R197 Diarrhea, unspecified: Secondary | ICD-10-CM | POA: Diagnosis present

## 2014-01-14 DIAGNOSIS — F17210 Nicotine dependence, cigarettes, uncomplicated: Secondary | ICD-10-CM | POA: Diagnosis present

## 2014-01-14 DIAGNOSIS — E1129 Type 2 diabetes mellitus with other diabetic kidney complication: Secondary | ICD-10-CM | POA: Diagnosis present

## 2014-01-14 DIAGNOSIS — A0472 Enterocolitis due to Clostridium difficile, not specified as recurrent: Secondary | ICD-10-CM | POA: Diagnosis present

## 2014-01-14 DIAGNOSIS — Z86718 Personal history of other venous thrombosis and embolism: Secondary | ICD-10-CM | POA: Diagnosis not present

## 2014-01-14 DIAGNOSIS — E43 Unspecified severe protein-calorie malnutrition: Secondary | ICD-10-CM | POA: Diagnosis present

## 2014-01-14 DIAGNOSIS — E1122 Type 2 diabetes mellitus with diabetic chronic kidney disease: Secondary | ICD-10-CM | POA: Diagnosis present

## 2014-01-14 DIAGNOSIS — Z91018 Allergy to other foods: Secondary | ICD-10-CM | POA: Diagnosis not present

## 2014-01-14 DIAGNOSIS — H547 Unspecified visual loss: Secondary | ICD-10-CM | POA: Diagnosis present

## 2014-01-14 DIAGNOSIS — Z79899 Other long term (current) drug therapy: Secondary | ICD-10-CM

## 2014-01-14 DIAGNOSIS — J9811 Atelectasis: Secondary | ICD-10-CM | POA: Diagnosis present

## 2014-01-14 DIAGNOSIS — Z794 Long term (current) use of insulin: Secondary | ICD-10-CM | POA: Diagnosis not present

## 2014-01-14 DIAGNOSIS — I82401 Acute embolism and thrombosis of unspecified deep veins of right lower extremity: Secondary | ICD-10-CM

## 2014-01-14 DIAGNOSIS — Z9114 Patient's other noncompliance with medication regimen: Secondary | ICD-10-CM | POA: Diagnosis present

## 2014-01-14 DIAGNOSIS — E11649 Type 2 diabetes mellitus with hypoglycemia without coma: Principal | ICD-10-CM | POA: Diagnosis present

## 2014-01-14 DIAGNOSIS — Z008 Encounter for other general examination: Secondary | ICD-10-CM

## 2014-01-14 DIAGNOSIS — M1990 Unspecified osteoarthritis, unspecified site: Secondary | ICD-10-CM | POA: Diagnosis present

## 2014-01-14 DIAGNOSIS — Z87898 Personal history of other specified conditions: Secondary | ICD-10-CM

## 2014-01-14 DIAGNOSIS — D649 Anemia, unspecified: Secondary | ICD-10-CM | POA: Diagnosis present

## 2014-01-14 DIAGNOSIS — R32 Unspecified urinary incontinence: Secondary | ICD-10-CM | POA: Diagnosis present

## 2014-01-14 DIAGNOSIS — N184 Chronic kidney disease, stage 4 (severe): Secondary | ICD-10-CM | POA: Diagnosis present

## 2014-01-14 DIAGNOSIS — I120 Hypertensive chronic kidney disease with stage 5 chronic kidney disease or end stage renal disease: Secondary | ICD-10-CM | POA: Diagnosis present

## 2014-01-14 DIAGNOSIS — I82409 Acute embolism and thrombosis of unspecified deep veins of unspecified lower extremity: Secondary | ICD-10-CM | POA: Diagnosis present

## 2014-01-14 DIAGNOSIS — N189 Chronic kidney disease, unspecified: Secondary | ICD-10-CM

## 2014-01-14 DIAGNOSIS — R4182 Altered mental status, unspecified: Secondary | ICD-10-CM

## 2014-01-14 DIAGNOSIS — I69354 Hemiplegia and hemiparesis following cerebral infarction affecting left non-dominant side: Secondary | ICD-10-CM

## 2014-01-14 DIAGNOSIS — Z7982 Long term (current) use of aspirin: Secondary | ICD-10-CM

## 2014-01-14 DIAGNOSIS — N390 Urinary tract infection, site not specified: Secondary | ICD-10-CM | POA: Diagnosis present

## 2014-01-14 DIAGNOSIS — E162 Hypoglycemia, unspecified: Secondary | ICD-10-CM | POA: Diagnosis present

## 2014-01-14 DIAGNOSIS — T68XXXA Hypothermia, initial encounter: Secondary | ICD-10-CM | POA: Diagnosis present

## 2014-01-14 DIAGNOSIS — Z7901 Long term (current) use of anticoagulants: Secondary | ICD-10-CM | POA: Diagnosis not present

## 2014-01-14 DIAGNOSIS — A047 Enterocolitis due to Clostridium difficile: Secondary | ICD-10-CM | POA: Diagnosis present

## 2014-01-14 DIAGNOSIS — Z8611 Personal history of tuberculosis: Secondary | ICD-10-CM

## 2014-01-14 DIAGNOSIS — R404 Transient alteration of awareness: Secondary | ICD-10-CM | POA: Diagnosis present

## 2014-01-14 DIAGNOSIS — N185 Chronic kidney disease, stage 5: Secondary | ICD-10-CM | POA: Diagnosis present

## 2014-01-14 DIAGNOSIS — J90 Pleural effusion, not elsewhere classified: Secondary | ICD-10-CM

## 2014-01-14 DIAGNOSIS — R569 Unspecified convulsions: Secondary | ICD-10-CM | POA: Diagnosis present

## 2014-01-14 DIAGNOSIS — Z8669 Personal history of other diseases of the nervous system and sense organs: Secondary | ICD-10-CM

## 2014-01-14 DIAGNOSIS — B1920 Unspecified viral hepatitis C without hepatic coma: Secondary | ICD-10-CM | POA: Diagnosis present

## 2014-01-14 LAB — CBC WITH DIFFERENTIAL/PLATELET
BASOS ABS: 0 10*3/uL (ref 0.0–0.1)
Basophils Relative: 0 % (ref 0–1)
EOS PCT: 3 % (ref 0–5)
Eosinophils Absolute: 0.1 10*3/uL (ref 0.0–0.7)
HCT: 37.7 % — ABNORMAL LOW (ref 39.0–52.0)
Hemoglobin: 11.7 g/dL — ABNORMAL LOW (ref 13.0–17.0)
LYMPHS PCT: 23 % (ref 12–46)
Lymphs Abs: 1.2 10*3/uL (ref 0.7–4.0)
MCH: 27.9 pg (ref 26.0–34.0)
MCHC: 31 g/dL (ref 30.0–36.0)
MCV: 89.8 fL (ref 78.0–100.0)
Monocytes Absolute: 0.4 10*3/uL (ref 0.1–1.0)
Monocytes Relative: 7 % (ref 3–12)
Neutro Abs: 3.5 10*3/uL (ref 1.7–7.7)
Neutrophils Relative %: 67 % (ref 43–77)
Platelets: 248 10*3/uL (ref 150–400)
RBC: 4.2 MIL/uL — ABNORMAL LOW (ref 4.22–5.81)
RDW: 16.7 % — AB (ref 11.5–15.5)
WBC: 5.2 10*3/uL (ref 4.0–10.5)

## 2014-01-14 LAB — URINE MICROSCOPIC-ADD ON

## 2014-01-14 LAB — CBG MONITORING, ED
GLUCOSE-CAPILLARY: 86 mg/dL (ref 70–99)
GLUCOSE-CAPILLARY: 89 mg/dL (ref 70–99)
GLUCOSE-CAPILLARY: 147 mg/dL — AB (ref 70–99)
Glucose-Capillary: 144 mg/dL — ABNORMAL HIGH (ref 70–99)

## 2014-01-14 LAB — URINALYSIS, ROUTINE W REFLEX MICROSCOPIC
Bilirubin Urine: NEGATIVE
Glucose, UA: NEGATIVE mg/dL
KETONES UR: NEGATIVE mg/dL
LEUKOCYTES UA: NEGATIVE
NITRITE: NEGATIVE
PH: 6 (ref 5.0–8.0)
Protein, ur: >300 mg/dL — AB
Specific Gravity, Urine: 1.02 (ref 1.005–1.030)
Urobilinogen, UA: 0.2 mg/dL (ref 0.0–1.0)

## 2014-01-14 LAB — BASIC METABOLIC PANEL
ANION GAP: 12 (ref 5–15)
BUN: 49 mg/dL — ABNORMAL HIGH (ref 6–23)
CALCIUM: 8.8 mg/dL (ref 8.4–10.5)
CO2: 22 mEq/L (ref 19–32)
Chloride: 107 mEq/L (ref 96–112)
Creatinine, Ser: 3.77 mg/dL — ABNORMAL HIGH (ref 0.50–1.35)
GFR calc Af Amer: 19 mL/min — ABNORMAL LOW (ref >90)
GFR calc non Af Amer: 16 mL/min — ABNORMAL LOW (ref >90)
Glucose, Bld: 65 mg/dL — ABNORMAL LOW (ref 70–99)
Potassium: 4.5 mEq/L (ref 3.7–5.3)
Sodium: 141 mEq/L (ref 137–147)

## 2014-01-14 LAB — GLUCOSE, CAPILLARY
GLUCOSE-CAPILLARY: 112 mg/dL — AB (ref 70–99)
GLUCOSE-CAPILLARY: 221 mg/dL — AB (ref 70–99)
Glucose-Capillary: 134 mg/dL — ABNORMAL HIGH (ref 70–99)

## 2014-01-14 LAB — PROTIME-INR
INR: 1.08 (ref 0.00–1.49)
PROTHROMBIN TIME: 14.2 s (ref 11.6–15.2)

## 2014-01-14 MED ORDER — ONDANSETRON HCL 4 MG PO TABS: 4.0000 mg | ORAL_TABLET | Freq: Four times a day (QID) | ORAL | Status: DC | PRN

## 2014-01-14 MED ORDER — ONDANSETRON HCL 4 MG/2ML IJ SOLN: 4.0000 mg | Freq: Four times a day (QID) | INTRAMUSCULAR | Status: DC | PRN

## 2014-01-14 MED ORDER — WARFARIN - PHARMACIST DOSING INPATIENT
Freq: Every day | Status: DC
  Administered 2014-01-15: 18:00:00

## 2014-01-14 MED ORDER — CARVEDILOL 6.25 MG PO TABS
18.7500 mg | ORAL_TABLET | Freq: Two times a day (BID) | ORAL | Status: DC
  Administered 2014-01-14 – 2014-01-18 (×8): 18.75 mg via ORAL
  Filled 2014-01-14 (×10): qty 1

## 2014-01-14 MED ORDER — WARFARIN SODIUM 3 MG PO TABS
3.0000 mg | ORAL_TABLET | Freq: Once | ORAL | Status: AC
  Administered 2014-01-14: 3 mg via ORAL
  Filled 2014-01-14: qty 1

## 2014-01-14 MED ORDER — INSULIN GLARGINE 100 UNIT/ML ~~LOC~~ SOLN
5.0000 [IU] | Freq: Every day | SUBCUTANEOUS | Status: DC
  Administered 2014-01-14 – 2014-01-17 (×4): 5 [IU] via SUBCUTANEOUS
  Filled 2014-01-14 (×5): qty 0.05

## 2014-01-14 MED ORDER — ISOSORBIDE MONONITRATE ER 60 MG PO TB24
60.0000 mg | ORAL_TABLET | Freq: Every day | ORAL | Status: DC
  Administered 2014-01-14 – 2014-01-18 (×5): 60 mg via ORAL
  Filled 2014-01-14 (×5): qty 1

## 2014-01-14 MED ORDER — NEPRO/CARBSTEADY PO LIQD
237.0000 mL | Freq: Three times a day (TID) | ORAL | Status: DC
  Administered 2014-01-14 – 2014-01-17 (×4): 237 mL via ORAL

## 2014-01-14 MED ORDER — ACETAMINOPHEN 325 MG PO TABS: 650.0000 mg | ORAL_TABLET | Freq: Four times a day (QID) | ORAL | Status: DC | PRN

## 2014-01-14 MED ORDER — WARFARIN VIDEO
1.0000 | Freq: Once | Status: AC
  Administered 2014-01-14: 1

## 2014-01-14 MED ORDER — VANCOMYCIN 50 MG/ML ORAL SOLUTION
125.0000 mg | Freq: Four times a day (QID) | ORAL | Status: DC
  Administered 2014-01-14 – 2014-01-18 (×16): 125 mg via ORAL
  Filled 2014-01-14 (×20): qty 2.5

## 2014-01-14 MED ORDER — SODIUM BICARBONATE 650 MG PO TABS
1300.0000 mg | ORAL_TABLET | Freq: Two times a day (BID) | ORAL | Status: DC
  Administered 2014-01-14 – 2014-01-18 (×9): 1300 mg via ORAL
  Filled 2014-01-14 (×10): qty 2

## 2014-01-14 MED ORDER — CALCITRIOL 0.25 MCG PO CAPS
0.2500 ug | ORAL_CAPSULE | Freq: Every day | ORAL | Status: DC
  Administered 2014-01-14 – 2014-01-18 (×5): 0.25 ug via ORAL
  Filled 2014-01-14 (×5): qty 1

## 2014-01-14 MED ORDER — INSULIN ASPART 100 UNIT/ML ~~LOC~~ SOLN
0.0000 [IU] | Freq: Three times a day (TID) | SUBCUTANEOUS | Status: DC
  Administered 2014-01-15: 1 [IU] via SUBCUTANEOUS
  Administered 2014-01-16: 3 [IU] via SUBCUTANEOUS

## 2014-01-14 MED ORDER — ATORVASTATIN CALCIUM 20 MG PO TABS
20.0000 mg | ORAL_TABLET | Freq: Every day | ORAL | Status: DC
  Administered 2014-01-14 – 2014-01-17 (×4): 20 mg via ORAL
  Filled 2014-01-14 (×5): qty 1

## 2014-01-14 MED ORDER — DEXTROSE 5 % IV SOLN
1.0000 g | Freq: Once | INTRAVENOUS | Status: DC
  Filled 2014-01-14: qty 1

## 2014-01-14 MED ORDER — ENOXAPARIN SODIUM 60 MG/0.6ML ~~LOC~~ SOLN
1.0000 mg/kg | SUBCUTANEOUS | Status: DC
  Administered 2014-01-14 – 2014-01-17 (×4): 55 mg via SUBCUTANEOUS
  Filled 2014-01-14 (×5): qty 0.6

## 2014-01-14 MED ORDER — CALCIUM ACETATE 667 MG PO CAPS
667.0000 mg | ORAL_CAPSULE | Freq: Three times a day (TID) | ORAL | Status: DC
  Administered 2014-01-14 – 2014-01-18 (×12): 667 mg via ORAL
  Filled 2014-01-14 (×14): qty 1

## 2014-01-14 MED ORDER — FUROSEMIDE 80 MG PO TABS
80.0000 mg | ORAL_TABLET | Freq: Every day | ORAL | Status: DC
  Administered 2014-01-14 – 2014-01-16 (×3): 80 mg via ORAL
  Filled 2014-01-14 (×3): qty 1

## 2014-01-14 MED ORDER — VITAMIN B-1 100 MG PO TABS
100.0000 mg | ORAL_TABLET | Freq: Every morning | ORAL | Status: DC
  Administered 2014-01-14 – 2014-01-18 (×5): 100 mg via ORAL
  Filled 2014-01-14 (×5): qty 1

## 2014-01-14 MED ORDER — COUMADIN BOOK
1.0000 | Freq: Once | Status: AC
  Administered 2014-01-14: 1
  Filled 2014-01-14: qty 1

## 2014-01-14 MED ORDER — AMLODIPINE BESYLATE 10 MG PO TABS
10.0000 mg | ORAL_TABLET | Freq: Every day | ORAL | Status: DC
  Administered 2014-01-14 – 2014-01-16 (×3): 10 mg via ORAL
  Filled 2014-01-14 (×3): qty 1

## 2014-01-14 MED ORDER — SODIUM CHLORIDE 0.9 % IJ SOLN
3.0000 mL | Freq: Two times a day (BID) | INTRAMUSCULAR | Status: DC
  Administered 2014-01-14 – 2014-01-18 (×9): 3 mL via INTRAVENOUS

## 2014-01-14 NOTE — ED Notes (Addendum)
PATIENT PLACED ON BEDPAN, SMALL SMEAR

## 2014-01-14 NOTE — ED Notes (Signed)
Unable to obtain oral or axillary temp. Pt's family at bedside.

## 2014-01-14 NOTE — ED Notes (Signed)
Gave pt orange juice per PA request.

## 2014-01-14 NOTE — ED Notes (Signed)
CBg 86

## 2014-01-14 NOTE — ED Notes (Signed)
Pt presents to department via [facility] for evaluation of hypoglycemia. Initial CBG of 44 per fire arrival on scene. Recently finished antibiotic treatment for cdiff. 22g R hand. Pt is alert and answering questions upon arrival. CBG 85 per EMS.

## 2014-01-14 NOTE — ED Notes (Signed)
Checked patient sugar it was 89 notified RN of blood sugar

## 2014-01-14 NOTE — ED Notes (Signed)
PT HAD LOOSE STOOL IN BEDPAN.

## 2014-01-14 NOTE — ED Provider Notes (Signed)
CSN: [id number]     Arrival date & time 01/14/14  0815 History   First MD Initiated Contact with Patient 01/14/14 0818     Chief Complaint  Patient presents with  . Hypoglycemia     (Consider location/radiation/quality/duration/timing/severity/associated sxs/prior Treatment) HPI Comments: Patient with past medical history of hypertension, CHF, diabetes, hep C, and seizures presents emergency department with chief complaint of hypoglycemia. Per EMS, the patient's blood sugar was 44 this morning when he awoke. He was given oral glucose and blood sugar improved to 86. When reassessed in the emergency department was still 4286. Patient states that he takes 12 units of NovoLog each evening, and is on a sliding scale. He did take his medication like normal last night, and has not taken anything this morning. He states that he recently finished treatment for C. difficile infection, but states that he is still having regular bouts of diarrhea. Patient denies being in any pain at this time. Denies any chest pain, shortness of breath, or abdominal pain. Additionally, he has also just recently seen for supratherapeutic INR level.  The history is provided by the patient. No language interpreter was used.    Past Medical History  Diagnosis Date  . Hypertension   . CHF (congestive heart failure)   . Seizures   . Tuberculosis 1960's    "got the tx for it" (10/27/2013)  . Type II diabetes mellitus   . Hepatitis C   . Stroke 06/2013    "LLE paralyzed since" (10/27/2013)  . Arthritis     "left knee hurts" (10/27/2013)  . Chronic kidney disease (CKD), stage IV (severe)   . Hypoglycemia 11/03/2013  . Brittle diabetes   . DVT (deep venous thrombosis)   . Hx of medication noncompliance    Past Surgical History  Procedure Laterality Date  . Av fistula placement Left 09/11/2013    Procedure: Insertion of arteriovenous gortex graft;  Surgeon: Chuck Hinthristopher S Dickson, MD;  [facility];  Service: Vascular;   Laterality: Left;  . Laceration repair Left ?2014    "cut my leg falling off a truck"  . Loop recorder implant  05/2012   Family History  Problem Relation Age of Onset  . COPD Mother   . Hypertension Mother   . Diabetes Mother    History  Substance Use Topics  . Smoking status: Current Every Day Smoker -- 0.25 packs/day for 42 years    Types: Cigarettes  . Smokeless tobacco: Never Used  . Alcohol Use: No    Review of Systems  Constitutional: Negative for fever and chills.  Respiratory: Negative for shortness of breath.   Cardiovascular: Negative for chest pain.  Gastrointestinal: Negative for nausea, vomiting, diarrhea and constipation.  Genitourinary: Negative for dysuria.  All other systems reviewed and are negative.     Allergies  Pork-derived products  Home Medications   Prior to Admission medications   Medication Sig Start Date End Date Taking? Authorizing Provider  acetaminophen (TYLENOL) 325 MG tablet Take 2 tablets (650 mg total) by mouth every 6 (six) hours as needed for mild pain (or Fever >/= 101). 11/06/13   Tora KindredMarianne L York, PA-C  amLODipine (NORVASC) 10 MG tablet Take 10 mg by mouth daily.    Historical Provider, MD  aspirin 81 MG tablet Take 81 mg by mouth daily.    Historical Provider, MD  atorvastatin (LIPITOR) 20 MG tablet Take 1 tablet (20 mg total) by mouth daily at 6 PM. 11/06/13   Stephani PoliceMarianne L York,  PA-C  calcitRIOL (ROCALTROL) 0.25 MCG capsule Take 1 capsule (0.25 mcg total) by mouth daily. 09/12/13   Zannie Cove, MD  calcium acetate (PHOSLO) 667 MG capsule Take 667 mg by mouth 3 (three) times daily with meals.    Historical Provider, MD  carvedilol (COREG) 6.25 MG tablet Take 3 tablets (18.75 mg total) by mouth 2 (two) times daily with a meal. 12/27/13   Catarina Hartshorn, MD  furosemide (LASIX) 80 MG tablet Take 1 tablet (80 mg total) by mouth daily. 09/12/13   Zannie Cove, MD  glucose blood test strip Use as instructed 11/01/13   Courtney A Forcucci, PA-C   insulin aspart (NOVOLOG) 100 UNIT/ML injection Inject 0-12 Units into the skin 3 (three) times daily with meals. SSI  BS  150-200  2 u    To be given SQ                 201-250  4 u                 251-300  6 u                 301-350  8 u                  351-400  10 u                   > 400  12 u and call MD 11/06/13   Stephani Police, PA-C  insulin glargine (LANTUS) 100 UNIT/ML injection Inject 0.12 mLs (12 Units total) into the skin at bedtime. 11/06/13   Tora Kindred York, PA-C  isosorbide mononitrate (IMDUR) 60 MG 24 hr tablet Take 1 tablet (60 mg total) by mouth daily. 12/25/13   Catarina Hartshorn, MD  metroNIDAZOLE (FLAGYL) 500 MG tablet Take 1 tablet (500 mg total) by mouth every 8 (eight) hours. 12/27/13   Catarina Hartshorn, MD  Nutritional Supplements (FEEDING SUPPLEMENT, NEPRO CARB STEADY,) LIQD Take 237 mLs by mouth 3 (three) times daily between meals. 12/25/13   Catarina Hartshorn, MD  ranitidine (ZANTAC) 150 MG tablet Take 150 mg by mouth daily.    Historical Provider, MD  sevelamer carbonate (RENVELA) 800 MG tablet Take 1,600 mg by mouth 3 (three) times daily with meals.    Historical Provider, MD  sodium bicarbonate 650 MG tablet Take 2 tablets (1,300 mg total) by mouth 2 (two) times daily. 10/29/13   Christiane Ha, MD  thiamine (VITAMIN B-1) 100 MG tablet Take 100 mg by mouth every morning.    Historical Provider, MD  traMADol (ULTRAM) 50 MG tablet Take 50 mg by mouth every 6 (six) hours as needed (pain).    Historical Provider, MD  warfarin (COUMADIN) 2 MG tablet Take 1 tablet (2 mg total) by mouth daily. 12/28/13   Catarina Hartshorn, MD   There were no vitals taken for this visit. Physical Exam  Constitutional: He is oriented to person, place, and time. He appears well-developed and well-nourished.  HENT:  Head: Normocephalic and atraumatic.  Eyes: Conjunctivae and EOM are normal. Pupils are equal, round, and reactive to light. Right eye exhibits no discharge. Left eye exhibits no discharge. No scleral  icterus.  Neck: Normal range of motion. Neck supple. No JVD present.  Cardiovascular: Normal rate, regular rhythm and normal heart sounds.  Exam reveals no gallop and no friction rub.   No murmur heard. Pulmonary/Chest: Effort normal and breath sounds normal. No respiratory distress. He has no  wheezes. He has no rales. He exhibits no tenderness.  Abdominal: Soft. He exhibits no distension and no mass. There is no tenderness. There is no rebound and no guarding.  No focal abdominal tenderness, no RLQ tenderness or pain at McBurney's point, no RUQ tenderness or Murphy's sign, no left-sided abdominal tenderness, no fluid wave, or signs of peritonitis   Musculoskeletal: Normal range of motion. He exhibits no edema or tenderness.  Moves all extremities  Neurological: He is alert and oriented to person, place, and time.  Skin: Skin is warm and dry.  Psychiatric: He has a normal mood and affect. His behavior is normal. Judgment and thought content normal.  Nursing note and vitals reviewed.   ED Course  Procedures (including critical care time) Results for orders placed or performed during the hospital encounter of 01/14/14  CBC with Differential  Result Value Ref Range   WBC 5.2 4.0 - 10.5 K/uL   RBC 4.20 (L) 4.22 - 5.81 MIL/uL   Hemoglobin 11.7 (L) 13.0 - 17.0 g/dL   HCT 91.437.7 (L) 78.239.0 - 95.652.0 %   MCV 89.8 78.0 - 100.0 fL   MCH 27.9 26.0 - 34.0 pg   MCHC 31.0 30.0 - 36.0 g/dL   RDW 21.316.7 (H) 08.611.5 - 57.815.5 %   Platelets 248 150 - 400 K/uL   Neutrophils Relative % 67 43 - 77 %   Neutro Abs 3.5 1.7 - 7.7 K/uL   Lymphocytes Relative 23 12 - 46 %   Lymphs Abs 1.2 0.7 - 4.0 K/uL   Monocytes Relative 7 3 - 12 %   Monocytes Absolute 0.4 0.1 - 1.0 K/uL   Eosinophils Relative 3 0 - 5 %   Eosinophils Absolute 0.1 0.0 - 0.7 K/uL   Basophils Relative 0 0 - 1 %   Basophils Absolute 0.0 0.0 - 0.1 K/uL  Basic metabolic panel  Result Value Ref Range   Sodium 141 137 - 147 mEq/L   Potassium 4.5 3.7 -  5.3 mEq/L   Chloride 107 96 - 112 mEq/L   CO2 22 19 - 32 mEq/L   Glucose, Bld 65 (L) 70 - 99 mg/dL   BUN 49 (H) 6 - 23 mg/dL   Creatinine, Ser 4.693.77 (H) 0.50 - 1.35 mg/dL   Calcium 8.8 8.4 - 62.910.5 mg/dL   GFR calc non Af Amer 16 (L) >90 mL/min   GFR calc Af Amer 19 (L) >90 mL/min   Anion gap 12 5 - 15  Urinalysis, Routine w reflex microscopic  Result Value Ref Range   Color, Urine YELLOW YELLOW   APPearance CLOUDY (A) CLEAR   Specific Gravity, Urine 1.020 1.005 - 1.030   pH 6.0 5.0 - 8.0   Glucose, UA NEGATIVE NEGATIVE mg/dL   Hgb urine dipstick MODERATE (A) NEGATIVE   Bilirubin Urine NEGATIVE NEGATIVE   Ketones, ur NEGATIVE NEGATIVE mg/dL   Protein, ur >528>300 (A) NEGATIVE mg/dL   Urobilinogen, UA 0.2 0.0 - 1.0 mg/dL   Nitrite NEGATIVE NEGATIVE   Leukocytes, UA NEGATIVE NEGATIVE  Protime-INR  Result Value Ref Range   Prothrombin Time 14.2 11.6 - 15.2 seconds   INR 1.08 0.00 - 1.49  Urine microscopic-add on  Result Value Ref Range   Squamous Epithelial / LPF FEW (A) RARE   WBC, UA 3-6 <3 WBC/hpf   RBC / HPF 3-6 <3 RBC/hpf   Bacteria, UA MANY (A) RARE   Casts GRANULAR CAST (A) NEGATIVE  CBG monitoring, ED  Result Value Ref Range  Glucose-Capillary 86 70 - 99 mg/dL  CBG monitoring, ED  Result Value Ref Range   Glucose-Capillary 89 70 - 99 mg/dL  CBG monitoring, ED  Result Value Ref Range   Glucose-Capillary 144 (H) 70 - 99 mg/dL   Dg Chest Portable 1 View  01/14/2014   CLINICAL DATA:  Hypoglycemia.  EXAM: PORTABLE CHEST - 1 VIEW  COMPARISON:  11/03/2013.  FINDINGS: Trachea is midline. Heart size stable. There is fairly diffuse mixed interstitial and airspace disease with a layering and possibly partially loculated right pleural effusion. Left costophrenic angle was not included on the image. Possible left lower lobe collapse/ consolidation.  IMPRESSION: 1. Diffuse mixed interstitial and airspace disease may be due to edema. Difficult to exclude pneumonia. 2. Possible left  lower lobe collapse/consolidation. 3. Layering and possibly partially loculated right pleural effusion.   Electronically Signed   By: Leanna Battles M.D.   On: 01/14/2014 10:11      EKG Interpretation None      MDM   Final diagnoses:  Encounter for medical assessment  Hypothermia, initial encounter  Hypoglycemia    8:26 AM CBG 86, will reassess.  Patient with hypoglycemia and hypothermia. Recently treated for C. Difficile.  Labs are remarkable for improved blood glucose level. Patient was placed on the [facility] for hypothermia. His temperature has improved to 96.7. He will be admitted to a medical floor. Questionable pneumonia on chest x-ray. Discussed patient with Dr. Lendell Caprice of internal medicine, who recommends chest CT. Patient has opacity on chest CT, patient will be started on HCAP Antibiotics, but will hold antibiotics at this time given patient's recent diagnosis of C. difficile.    Roxy Horseman, PA-C 01/14/14 1144  Vida Roller, MD 01/15/14 [phone]

## 2014-01-14 NOTE — H&P (Addendum)
[facility] History and Physical  [name] [name] ZOX:[id number] DOB: 02-Nov-1955 DOA: 01/14/2014  Referring physician: EDP  PCP: Lonia BloodGARBA,LAWAL, MD   Chief Complaint: Hypoglycemia  HPI: [name] [name] is a 58 y.o. 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 Patient has a history of stroke and is difficult historian. Much of the history is per chart and patient's sister. When EMS arrived, blood glucose was 44.  Was given oral glucose. In ED, CBG 86. Hypothermic with rectal temp 95 degrees F.  Chest x-ray showed possible pneumonia versus pulmonary edema and effusion. CT chest however showed no infiltrate or pulmonary edema. Patient has had loose stool and recently completed a 10 day course of Flagyl for C. difficile colitis.  Reports diarrhea resolved, then recurred a few days ago.  No abdominal pain fevers. Appetite good. No vomiting. No cough or shortness of breath.  Patient's sister reports that his blood sugars are sometimes low in the mornings, in the 60s or 70s. Admitted for hypoglycemia, hypothermia, diarrhea. Now normothermic after getting bear hugger.  Also, patient's sister is concerned about possible "seizures".  She describes "staring spells" where patient will not answer questions and sometimes has drooling. Unrelated to hypoglycemia.  She reports that [place] but has not been on antiepileptic medication.    Review of Systems:  As per history of present illness. Complete Systems reviewed.  Past Medical History  Diagnosis Date  . Hypertension   . CHF (congestive heart failure)   . Seizures   . Tuberculosis 1960's    "got the tx for it" (10/27/2013)  . Type II diabetes mellitus   . Hepatitis C   . Stroke 06/2013    "LLE paralyzed since" (10/27/2013)  . Arthritis     "left knee hurts" (10/27/2013)    . Chronic kidney disease (CKD), stage IV (severe)   . Hypoglycemia 11/03/2013  . Brittle diabetes   . DVT (deep venous thrombosis)   . Hx of medication noncompliance    Past Surgical History  Procedure Laterality Date  . Av fistula placement Left 09/11/2013    Procedure: Insertion of arteriovenous gortex graft;  Surgeon: Chuck Hinthristopher S Dickson, MD;  [facility];  Service: Vascular;  Laterality: Left;  . Laceration repair Left ?2014    "cut my leg falling off a truck"  . Loop recorder implant  05/2012   Social History:  reports that he has been smoking Cigarettes.  He has a 10.5 pack-year smoking history. He has never used smokeless tobacco. He reports that he does not drink alcohol or use illicit drugs.  Allergies  Allergen Reactions  . Pork-Derived Conservation officer, natureroducts Hives and Other (See Comments)    The patient has tolerated lovenox    Family History  Problem Relation Age of Onset  . COPD Mother   . Hypertension Mother   . Diabetes Mother      Prior to Admission medications   Medication Sig Start Date End Date Taking? Authorizing Provider  amLODipine (NORVASC) 10 MG tablet Take 10 mg by mouth daily.   Yes Historical Provider, MD  aspirin 81 MG tablet Take 81 mg by mouth daily.   Yes Historical Provider, MD  atorvastatin (LIPITOR) 20 MG tablet Take 1 tablet (20 mg total) by mouth daily at 6 PM. 11/06/13  Yes Tora KindredMarianne L York, PA-C  calcitRIOL (ROCALTROL) 0.25 MCG capsule Take 1  capsule (0.25 mcg total) by mouth daily. 09/12/13  Yes Zannie CovePreetha Joseph, MD  calcium acetate (PHOSLO) 667 MG capsule Take 667 mg by mouth 3 (three) times daily with meals.   Yes Historical Provider, MD  carvedilol (COREG) 6.25 MG tablet Take 3 tablets (18.75 mg total) by mouth 2 (two) times daily with a meal. 12/27/13  Yes Catarina Hartshornavid Tat, MD  glucose blood test strip Use as instructed 11/01/13  Yes Courtney A Forcucci, PA-C  insulin aspart (NOVOLOG) 100 UNIT/ML injection Inject 0-12 Units into the skin 3 (three) times  daily with meals. SSI  BS  150-200  2 u    To be given SQ                 201-250  4 u                 251-300  6 u                 301-350  8 u                  351-400  10 u                   > 400  12 u and call MD 11/06/13  Yes Tora KindredMarianne L York, PA-C  insulin glargine (LANTUS) 100 UNIT/ML injection Inject 0.12 mLs (12 Units total) into the skin at bedtime. 11/06/13  Yes Tora KindredMarianne L York, PA-C  isosorbide mononitrate (IMDUR) 60 MG 24 hr tablet Take 1 tablet (60 mg total) by mouth daily. 12/25/13  Yes Catarina Hartshornavid Tat, MD  Nutritional Supplements (FEEDING SUPPLEMENT, NEPRO CARB STEADY,) LIQD Take 237 mLs by mouth 3 (three) times daily between meals. 12/25/13  Yes Catarina Hartshornavid Tat, MD  ranitidine (ZANTAC) 150 MG tablet Take 150 mg by mouth daily.   Yes Historical Provider, MD  sevelamer carbonate (RENVELA) 800 MG tablet Take 1,600 mg by mouth 3 (three) times daily with meals.   Yes Historical Provider, MD  sodium bicarbonate 650 MG tablet Take 2 tablets (1,300 mg total) by mouth 2 (two) times daily. Patient taking differently: Take 650 mg by mouth 4 (four) times daily.  10/29/13  Yes Christiane Haorinna L Eswin Worrell, MD  thiamine (VITAMIN B-1) 100 MG tablet Take 100 mg by mouth every morning.   Yes Historical Provider, MD  acetaminophen (TYLENOL) 325 MG tablet Take 2 tablets (650 mg total) by mouth every 6 (six) hours as needed for mild pain (or Fever >/= 101). Patient not taking: Reported on 01/14/2014 11/06/13   Stephani PoliceMarianne L York, PA-C  furosemide (LASIX) 80 MG tablet Take 1 tablet (80 mg total) by mouth daily. Patient not taking: Reported on 01/14/2014 09/12/13   Zannie CovePreetha Joseph, MD  metroNIDAZOLE (FLAGYL) 500 MG tablet Take 1 tablet (500 mg total) by mouth every 8 (eight) hours. Patient not taking: Reported on 01/14/2014 12/27/13   Catarina Hartshornavid Tat, MD  warfarin (COUMADIN) 2 MG tablet Take 1 tablet (2 mg total) by mouth daily. Patient not taking: Reported on 01/14/2014 12/28/13   Catarina Hartshornavid Tat, MD   Physical Exam: Filed Vitals:    01/14/14 1114 01/14/14 1202 01/14/14 1203 01/14/14 1308  BP:  195/98  194/99  Pulse:  71  71  Temp: 96.7 F (35.9 C)     TempSrc: Rectal     Resp:  20  16  Height:    5\' 7"  (1.702 m)  Weight:    53.7 kg (118 lb 6.2 oz)  SpO2:  90% 93% 92%    Wt Readings from Last 3 Encounters:  01/14/14 53.7 kg (118 lb 6.2 oz)  01/06/14 54.432 kg (120 lb)  12/26/13 50.4 kg (111 lb 1.8 oz)    BP 194/89 mmHg  Pulse 71  Temp(Src) 96.7 F (35.9 C) (Rectal)  Resp 16  Ht 5\' 7"  (1.702 m)  Wt 53.7 kg (118 lb 6.2 oz)  BMI 18.54 kg/m2  SpO2 92%  General Appearance:    Alert, cooperative, no distress, chronically ill-appearing black male. Oriented but unable to answer detailed specifics.  Head:    Normocephalic, without obvious abnormality, atraumatic  Eyes:    PERRL, conjunctiva/corneas clear, EOM's intact,     Nose:   Nares normal, septum midline, mucosa normal, no drainage   or sinus tenderness  Throat:   Lips, mucosa, and tongue normal; teeth and gums normal  Neck:   Supple, symmetrical, trachea midline, no adenopathy;       thyroid:  No enlargement/tenderness/nodules; no carotid   bruit or JVD  Back:     Symmetric, no curvature, ROM normal, no CVA tenderness  Lungs:     Clear to auscultation bilaterally, respirations unlabored  Chest wall:    No tenderness or deformity  Heart:    Regular rate and rhythm, S1 and S2 normal, no murmur, rub   or gallop  Abdomen:     Soft, non-tender, bowel sounds active all four quadrants,    no masses, no organomegaly  Genitalia:   deferred  Rectal:    deferred  Extremities:   Extremities normal, atraumatic, no cyanosis or edema  Pulses:   2+ and symmetric all extremities  Skin:   Skin color, texture, turgor normal, no rashes or lesions  Lymph nodes:   Cervical, supraclavicular, and axillary nodes normal  Neurologic:   CNII-XII intact. Chronic left-sided weakness              Psychiatric: Normal affect. Cooperative. Calm.  Labs on Admission:  Basic  Metabolic Panel:  Recent Labs Lab 01/14/14 0850  NA 141  K 4.5  CL 107  CO2 22  GLUCOSE 65*  BUN 49*  CREATININE 3.77*  CALCIUM 8.8   Liver Function Tests: No results for input(s): AST, ALT, ALKPHOS, BILITOT, PROT, ALBUMIN in the last 168 hours. No results for input(s): LIPASE, AMYLASE in the last 168 hours. No results for input(s): AMMONIA in the last 168 hours. CBC:  Recent Labs Lab 01/14/14 0850  WBC 5.2  NEUTROABS 3.5  HGB 11.7*  HCT 37.7*  MCV 89.8  PLT 248   Cardiac Enzymes: No results for input(s): CKTOTAL, CKMB, CKMBINDEX, TROPONINI in the last 168 hours.  BNP (last 3 results) No results for input(s): PROBNP in the last 8760 hours. CBG:  Recent Labs Lab 01/14/14 0823 01/14/14 0912 01/14/14 1109 01/14/14 1217 01/14/14 1313  GLUCAP 86 89 144* 147* 134*    Radiological Exams on Admission: Ct Chest Wo Contrast  01/14/2014   CLINICAL DATA:  58 year old male with hypoglycemia and chest congestion. Initial encounter.  EXAM: CT CHEST WITHOUT CONTRAST  TECHNIQUE: Multidetector CT imaging of the chest was performed following the standard protocol without IV contrast.  COMPARISON:  Portable chest radiograph 0940 hr today and earlier.  FINDINGS: Large bilateral layering pleural effusions. No pericardial effusion.  Compressive atelectasis in both lungs. Some air bronchograms in both lower lobes. Major airways are patent except for a atelectatic changes. Mild mostly depending ground-glass opacity in both upper lobes.  No acute osseous abnormality  identified. Left chest cardiac event recorder.  Widespread calcified atherosclerosis in the upper abdomen. The thoracic aorta is relatively spared. No mediastinal lymphadenopathy. No axillary lymphadenopathy. Negative non contrast thoracic inlet.  Negative non contrast liver, spleen, pancreas, adrenal glands, kidneys, and bowel in the upper abdomen.  IMPRESSION: 1. Dominant abnormality is large bilateral layering pleural effusions  with compressive atelectasis in both lungs. 2. No superimposed pneumonia, and no definite pulmonary edema at this time.   Electronically Signed   By: Augusto Gamble M.D.   On: 01/14/2014 13:18   Dg Chest Portable 1 View  01/14/2014   CLINICAL DATA:  Hypoglycemia.  EXAM: PORTABLE CHEST - 1 VIEW  COMPARISON:  11/03/2013.  FINDINGS: Trachea is midline. Heart size stable. There is fairly diffuse mixed interstitial and airspace disease with a layering and possibly partially loculated right pleural effusion. Left costophrenic angle was not included on the image. Possible left lower lobe collapse/ consolidation.  IMPRESSION: 1. Diffuse mixed interstitial and airspace disease may be due to edema. Difficult to exclude pneumonia. 2. Possible left lower lobe collapse/consolidation. 3. Layering and possibly partially loculated right pleural effusion.   Electronically Signed   By: Leanna Battles M.D.   On: 01/14/2014 10:11    EKG: tracing reviewed. NSR with chronic RBBB  Assessment/Plan Active Problems:   Hypoglycemia: decrease lantus. Customize ssi to very sensitive scale   Hypothermia: likely related to above.  Resolved. TSH normal 10/15    Unresponsive episodes:  History of seizures. Monitor on tele. Check CT brain and EEG.     DM (diabetes mellitus), type 2 with renal complications   Right arm DVT (deep venous thrombosis) 9/15: INR almost normal. Doubt compliance with coumadin. subtherapeutic last admission as well.  Resume coumadin and stress compliance with family. lovenox bridge.   Diarrhea: suspect recurrence of c diff. Will check pcr and start empiric oral vancomycin. CKD 4: at baseline H/o CVA with left sided weakness  Code Status: full Family Communication: sister by phone  Time spent: 60 min  Tyrell Brereton L [email or web address]

## 2014-01-14 NOTE — Procedures (Signed)
ELECTROENCEPHALOGRAM REPORT   Patient: [name] [name]       Room #: 1O-105W-29 EEG No. ID: [id number] Age: 58 y.o.        Sex: male Referring Physician: Dr Lendell CapriceSullivan Report Date:  01/14/2014        Interpreting Physician: Elspeth ChoSUMNER, Bindu Docter, JUSTIN  History: [name] [name] is an 58 y.o. male , history of diabetes, presents with hypoglycemia, family members reportedly found the patient to be abnormal mental status, blood sugar was in the 40s, given oral glucose by paramedics with return of normal blood sugar however the patient remained slightly confused complaining of sinus pressure and a sore throat, mild cough, denies any other complaints and appears comfortable. He is hypothermic but has soft abdomen, clear heart and lung sounds, no peripheral edema, follows commands appropriately. Lab work pending, the patient is able to take oral food, no recurrent hypoglycemia as of yet, anticipate likely admission due to hypothermia, hypoglycemia of unknown source.  Medications:  Scheduled: . amLODipine  10 mg Oral Daily  . atorvastatin  20 mg Oral q1800  . calcitRIOL  0.25 mcg Oral Daily  . calcium acetate  667 mg Oral TID WC  . carvedilol  18.75 mg Oral BID WC  . enoxaparin (LOVENOX) injection  1 mg/kg Subcutaneous Q24H  . feeding supplement (NEPRO CARB STEADY)  237 mL Oral TID BM  . furosemide  80 mg Oral Daily  . insulin aspart  0-5 Units Subcutaneous TID WC  . insulin glargine  5 Units Subcutaneous QHS  . isosorbide mononitrate  60 mg Oral Daily  . sodium bicarbonate  1,300 mg Oral BID  . sodium chloride  3 mL Intravenous Q12H  . thiamine  100 mg Oral q morning - 10a  . vancomycin  125 mg Oral 4 times per day  . warfarin  3 mg Oral ONCE-1800  . Warfarin - Pharmacist Dosing Inpatient   Does not apply q1800    Conditions of Recording:  This is a 16 channel EEG carried out with the patient in the drowsy state.  Description:  The waking background activity consists of a low voltage, symmetrical,  fairly well organized, 7 to 8 Hz alpha activity, seen from the parieto-occipital and posterior temporal regions.  Low voltage fast activity, poorly organized, is seen anteriorly and is at times superimposed on more posterior regions.  A mixture of theta and alpha rhythms are seen from the central and temporal regions.  The patient remains in a drowsy state for the majority of the recording with slowing to irregular, low voltage theta and beta activity. Stage II sleep is not obtained.   Hyperventilation was not performed. Intermittent photic stimulation was performed but failed to illicit any change in the tracing.   IMPRESSION: Normal drowsy EEG. This does not rule out an underlying seizure disorder.    Elspeth Choeter Rohith Fauth, DO [facility] [phone]  If 7pm- 7am, please page neurology on call as listed in AMION. 01/14/2014, 5:00 PM

## 2014-01-14 NOTE — ED Notes (Signed)
Pt had a loose stool upon arrival

## 2014-01-14 NOTE — ED Notes (Signed)
Pt has breakfast tray . ?

## 2014-01-14 NOTE — Progress Notes (Signed)
ANTICOAGULATION CONSULT NOTE - Initial Consult  Pharmacy Consult for Lovenox, Coumadin Indication: VTE treatment  Allergies  Allergen Reactions  . Pork-Derived Conservation officer, natureroducts Hives and Other (See Comments)    The patient has tolerated lovenox    Patient Measurements: Height: 5\' 7"  (170.2 cm) Weight: 118 lb 6.2 oz (53.7 kg) IBW/kg (Calculated) : 66.1 Heparin Dosing Weight:   Vital Signs: Temp: 96.7 F (35.9 C) (12/16 1114) Temp Source: Rectal (12/16 1114) BP: 194/99 mmHg (12/16 1308) Pulse Rate: 71 (12/16 1308)  Labs:  Recent Labs  01/14/14 0850  HGB 11.7*  HCT 37.7*  PLT 248  LABPROT 14.2  INR 1.08  CREATININE 3.77*    Estimated Creatinine Clearance: 16.4 mL/min (by C-G formula based on Cr of 3.77).   Medical History: Past Medical History  Diagnosis Date  . Hypertension   . CHF (congestive heart failure)   . Seizures   . Tuberculosis 1960's    "got the tx for it" (10/27/2013)  . Type II diabetes mellitus   . Hepatitis C   . Stroke 06/2013    "LLE paralyzed since" (10/27/2013)  . Arthritis     "left knee hurts" (10/27/2013)  . Chronic kidney disease (CKD), stage IV (severe)   . Hypoglycemia 11/03/2013  . Brittle diabetes   . DVT (deep venous thrombosis)   . Hx of medication noncompliance     Medications:  Scheduled:  . amLODipine  10 mg Oral Daily  . atorvastatin  20 mg Oral q1800  . calcitRIOL  0.25 mcg Oral Daily  . calcium acetate  667 mg Oral TID WC  . carvedilol  18.75 mg Oral BID WC  . enoxaparin (LOVENOX) injection  1 mg/kg Subcutaneous Q24H  . feeding supplement (NEPRO CARB STEADY)  237 mL Oral TID BM  . furosemide  80 mg Oral Daily  . insulin aspart  0-5 Units Subcutaneous TID WC  . insulin glargine  5 Units Subcutaneous QHS  . isosorbide mononitrate  60 mg Oral Daily  . sodium bicarbonate  1,300 mg Oral BID  . sodium chloride  3 mL Intravenous Q12H  . thiamine  100 mg Oral q morning - 10a  . vancomycin  125 mg Oral 4 times per day     Assessment: 58yo male with VTE, on Coumadin 2mg  daily at home but has told the [facility] tech that he has not been taking.  INR 1.08 on admission.  Hg 11.7, pltc wnl.  Lovenox is being added to bridge pt while INR sub-therapeutic.  Goal of Therapy:  INR 2-3 Anti-Xa level 0.6-1 units/ml 4hrs after LMWH dose given Monitor platelets by anticoagulation protocol: Yes   Plan:  Lovenox 55mg  SQ daily, adjusted for renal fxn Coumadin 3mg  today Daily protime CBC q72 Watch for s/s of bleeding Coumadin book and video  Marisue HumbleKendra Shereka Lafortune, PharmD Clinical Pharmacist [facility]

## 2014-01-14 NOTE — ED Provider Notes (Signed)
58 year old male, history of diabetes, presents with hypoglycemia, family members reportedly found the patient to be abnormal mental status, blood sugar was in the 40s, given oral glucose by paramedics with return of normal blood sugar however the patient remained slightly confused complaining of sinus pressure and a sore throat, mild cough, denies any other complaints and appears comfortable. He is hypothermic but has soft abdomen, clear heart and lung sounds, no peripheral edema, follows commands appropriately. Lab work pending, the patient is able to take oral food, no recurrent hypoglycemia as of yet, anticipate likely admission due to hypothermia, hypoglycemia of unknown source.  CBG stayed within a normal range, patient was persistently hypothermic, due to hypoglycemia and hypothermia of unknown source the patient will be admitted to the hospital.  Medical screening examination/treatment/procedure(s) were conducted as a shared visit with non-physician practitioner(s) and myself.  I personally evaluated the patient during the encounter.  Clinical Impression:   Final diagnoses:  Encounter for medical assessment  Hypothermia, initial encounter  Hypoglycemia         Vida RollerBrian D Breckon Reeves, MD 01/15/14 [phone]

## 2014-01-14 NOTE — ED Notes (Signed)
Pt had large loose stool. Cleaned pt and changed lenins

## 2014-01-14 NOTE — Progress Notes (Signed)
EEG completed; results pending.    

## 2014-01-15 DIAGNOSIS — A047 Enterocolitis due to Clostridium difficile: Secondary | ICD-10-CM

## 2014-01-15 DIAGNOSIS — A0472 Enterocolitis due to Clostridium difficile, not specified as recurrent: Secondary | ICD-10-CM | POA: Diagnosis present

## 2014-01-15 LAB — GLUCOSE, CAPILLARY
GLUCOSE-CAPILLARY: 102 mg/dL — AB (ref 70–99)
GLUCOSE-CAPILLARY: 121 mg/dL — AB (ref 70–99)
GLUCOSE-CAPILLARY: 166 mg/dL — AB (ref 70–99)
GLUCOSE-CAPILLARY: 171 mg/dL — AB (ref 70–99)

## 2014-01-15 LAB — CBC
HEMATOCRIT: 35 % — AB (ref 39.0–52.0)
Hemoglobin: 11.3 g/dL — ABNORMAL LOW (ref 13.0–17.0)
MCH: 28.9 pg (ref 26.0–34.0)
MCHC: 32.3 g/dL (ref 30.0–36.0)
MCV: 89.5 fL (ref 78.0–100.0)
Platelets: 239 10*3/uL (ref 150–400)
RBC: 3.91 MIL/uL — AB (ref 4.22–5.81)
RDW: 16.7 % — ABNORMAL HIGH (ref 11.5–15.5)
WBC: 5.1 10*3/uL (ref 4.0–10.5)

## 2014-01-15 LAB — PROTIME-INR
INR: 1.18 (ref 0.00–1.49)
PROTHROMBIN TIME: 15.1 s (ref 11.6–15.2)

## 2014-01-15 LAB — CLOSTRIDIUM DIFFICILE BY PCR: Toxigenic C. Difficile by PCR: POSITIVE — AB

## 2014-01-15 MED ORDER — HYDRALAZINE HCL 20 MG/ML IJ SOLN
10.0000 mg | Freq: Four times a day (QID) | INTRAMUSCULAR | Status: DC | PRN
  Administered 2014-01-16: 10 mg via INTRAVENOUS
  Filled 2014-01-15: qty 1

## 2014-01-15 MED ORDER — WARFARIN SODIUM 3 MG PO TABS
3.0000 mg | ORAL_TABLET | Freq: Once | ORAL | Status: AC
  Administered 2014-01-15: 3 mg via ORAL
  Filled 2014-01-15: qty 1

## 2014-01-15 NOTE — Progress Notes (Signed)
PROGRESS NOTE  Elisabeth MostClarence Valvo ZOX:[id number] DOB: 1955-07-10 DOA: 01/14/2014 PCP: Lonia BloodGARBA,LAWAL, MD  58 year old male with recurrent admissions for hypoglycemia in the setting of brittle diabetes, he has a history of chronic kidney disease, stroke, seizures, and hypertension.  He was found unresponsive by his sister, who called EMS.  On evaluation he had a CBG of 44.  In the ER he was found to be hypothermic with diarrhea.  Per the history, he had recently finished a course of antibiotics for C. difficile.   Assessment/Plan:  Hypoglycemia in the setting of brittle diabetes Lantus has been decreased, CBGs are now stable. Monitor on low dose lantus.   Hypothermia Resolved with bear hugger  Question of seizures Sister reports staring episodes that last 10 minutes during which she drools and has incontinence. EEG was obtained and is normal. CT head is negative for acute changes. Neurology, Dr. Thad Rangereynolds, curb sided. She advised that he seek outpatient follow-up once his CBGs have stabilized and his C. difficile has cleared if he is still having these episodes. No staring episodes during this admission.   C. difficile diarrhea He is C. difficile PCR positive.  Patient having 6-8 watery bowel movements daily.  Oral vancomycin started on admission Day 2 oral vancomycin.   History of CVA Residual left-sided weakness. PT consult ordered.  History of chronic kidney disease stage IV Creatinine appears stable. Patient noted to have fistula in place.  Baseline creatinine approximately 3.7 Monitor urine out put. Strict I and O. Will probably need to increase lasix to help with diuresis, when diarrhea improved and if renal functio allows it.    Recent history of right upper extremity DVT On Coumadin, INR subtherapeutic. Warfarin and Lovenox per pharmacy.  UTI? UA with many bacteria but negative nitrites or leukocytes. Urine culture pending. Not currently on antibiotics for UTI.  Pleural  Effusions Imagine shows large bilateral layering pleural effusions.  Likely due to worsening renal function.  Patient with no complaints. Oxygen sats are 90 - 91% on room air.  Patient will likely auto diurese with c-diff.  Will not increase lasix dose at this point for fear of worsening his creatinine.  Will monitor and adjust lasix with diarrhea improves.   DVT Prophylaxis:  Lovenox and warfarin  Code Status: Full code Family Communication:  Spoke with sister Celeste at patient's room Disposition Plan: Inpatient. Family requesting snf.  Consultants:  None  Procedures:  EEG  Antibiotics: Anti-infectives    Start     Dose/Rate Route Frequency Ordered Stop   01/14/14 1500  vancomycin (VANCOCIN) 50 mg/mL oral solution 125 mg     125 mg Oral 4 times per day 01/14/14 1417     01/14/14 1045  ceFEPIme (MAXIPIME) 1 g in dextrose 5 % 50 mL IVPB  Status:  Discontinued     1 g100 mL/hr over 30 Minutes Intravenous  Once 01/14/14 1044 01/14/14 1057        HPI/Subjective: Patient reports he lives at home with his sister who cares for him.   Objective: Filed Vitals:   01/15/14 0619 01/15/14 0848 01/15/14 1039 01/15/14 1350  BP: 176/80 181/78 156/74 156/74  Pulse:   66 67  Temp: 98.8 F (37.1 C)  97.7 F (36.5 C) 97.3 F (36.3 C)  TempSrc: Oral  Oral Oral  Resp: 18  19 16   Height:      Weight:      SpO2: 91%  93% 90%    Intake/Output Summary (Last 24 hours) at  01/15/14 1452 Last data filed at 01/15/14 [id number]  Gross per 24 hour  Intake      0 ml  Output    375 ml  Net   -375 ml   Filed Weights   01/14/14 0836 01/14/14 1308 01/15/14 0500  Weight: 54.432 kg (120 lb) 53.7 kg (118 lb 6.2 oz) 46.8 kg (103 lb 2.8 oz)    Exam: General: Thin, AAA, male, NAD, appears older than stated age  HEENT:  EOMI, Anicteic Sclera, MMM. No pharyngeal erythema or exudates  Neck: Supple, no JVD, no masses  Cardiovascular: RRR, S1 S2 auscultated, no rubs, murmurs or gallops.   Respiratory:  Clear to auscultation bilaterally with equal chest rise  Abdomen: thin, soft, nontender, nondistended, + bowel sounds  Extremities: warm dry without cyanosis clubbing or edema.  Neuro: AAOx3, cranial nerves grossly intact. Right sided extremities notably stronger than left but 5/5 strength in each. Skin: Without rashes exudates or nodules.     Data Reviewed: Basic Metabolic Panel:  Recent Labs Lab 01/14/14 0850  NA 141  K 4.5  CL 107  CO2 22  GLUCOSE 65*  BUN 49*  CREATININE 3.77*  CALCIUM 8.8   CBC:  Recent Labs Lab 01/14/14 0850 01/15/14 0655  WBC 5.2 5.1  NEUTROABS 3.5  --   HGB 11.7* 11.3*  HCT 37.7* 35.0*  MCV 89.8 89.5  PLT 248 239   CBG:  Recent Labs Lab 01/14/14 1313 01/14/14 1653 01/14/14 2119 01/15/14 0802 01/15/14 1144  GLUCAP 134* 112* 221* 102* 121*    Recent Results (from the past 240 hour(s))  Clostridium Difficile by PCR     Status: Abnormal   Collection Time: 01/14/14  5:40 PM  Result Value Ref Range Status   C difficile by pcr POSITIVE (A) NEGATIVE Final    Comment: CRITICAL RESULT CALLED TO, READ BACK BY AND VERIFIED WITH: LUCAS RN 9:55 01/15/14 ([email or web address])      Studies: Ct Head Wo Contrast  01/14/2014   CLINICAL DATA:  Unresponsive.  EXAM: CT HEAD WITHOUT CONTRAST  TECHNIQUE: Contiguous axial images were obtained from the base of the skull through the vertex without intravenous contrast.  COMPARISON:  CT head 11/03/2013  FINDINGS: Generalized atrophy. Chronic microvascular ischemic changes in the white matter. Negative for acute infarct. Negative for hemorrhage or mass.  Atherosclerotic calcification.  Calvarium is intact.  IMPRESSION: Atrophy and chronic microvascular ischemia.  No acute abnormality   Electronically Signed   By: Marlan Palauharles  Clark M.D.   On: 01/14/2014 16:10   Ct Chest Wo Contrast  01/14/2014   CLINICAL DATA:  58 year old male with hypoglycemia and chest congestion. Initial encounter.  EXAM: CT CHEST WITHOUT CONTRAST   TECHNIQUE: Multidetector CT imaging of the chest was performed following the standard protocol without IV contrast.  COMPARISON:  Portable chest radiograph 0940 hr today and earlier.  FINDINGS: Large bilateral layering pleural effusions. No pericardial effusion.  Compressive atelectasis in both lungs. Some air bronchograms in both lower lobes. Major airways are patent except for a atelectatic changes. Mild mostly depending ground-glass opacity in both upper lobes.  No acute osseous abnormality identified. Left chest cardiac event recorder.  Widespread calcified atherosclerosis in the upper abdomen. The thoracic aorta is relatively spared. No mediastinal lymphadenopathy. No axillary lymphadenopathy. Negative non contrast thoracic inlet.  Negative non contrast liver, spleen, pancreas, adrenal glands, kidneys, and bowel in the upper abdomen.  IMPRESSION: 1. Dominant abnormality is large bilateral layering pleural effusions with compressive atelectasis in both lungs. 2.  No superimposed pneumonia, and no definite pulmonary edema at this time.   Electronically Signed   By: Augusto Gamble M.D.   On: 01/14/2014 13:18   Dg Chest Portable 1 View  01/14/2014   CLINICAL DATA:  Hypoglycemia.  EXAM: PORTABLE CHEST - 1 VIEW  COMPARISON:  11/03/2013.  FINDINGS: Trachea is midline. Heart size stable. There is fairly diffuse mixed interstitial and airspace disease with a layering and possibly partially loculated right pleural effusion. Left costophrenic angle was not included on the image. Possible left lower lobe collapse/ consolidation.  IMPRESSION: 1. Diffuse mixed interstitial and airspace disease may be due to edema. Difficult to exclude pneumonia. 2. Possible left lower lobe collapse/consolidation. 3. Layering and possibly partially loculated right pleural effusion.   Electronically Signed   By: Leanna Battles M.D.   On: 01/14/2014 10:11    Scheduled Meds: . amLODipine  10 mg Oral Daily  . atorvastatin  20 mg Oral q1800    . calcitRIOL  0.25 mcg Oral Daily  . calcium acetate  667 mg Oral TID WC  . carvedilol  18.75 mg Oral BID WC  . enoxaparin (LOVENOX) injection  1 mg/kg Subcutaneous Q24H  . feeding supplement (NEPRO CARB STEADY)  237 mL Oral TID BM  . furosemide  80 mg Oral Daily  . insulin aspart  0-5 Units Subcutaneous TID WC  . insulin glargine  5 Units Subcutaneous QHS  . isosorbide mononitrate  60 mg Oral Daily  . sodium bicarbonate  1,300 mg Oral BID  . sodium chloride  3 mL Intravenous Q12H  . thiamine  100 mg Oral q morning - 10a  . vancomycin  125 mg Oral 4 times per day  . warfarin  3 mg Oral ONCE-1800  . Warfarin - Pharmacist Dosing Inpatient   Does not apply q1800   Continuous Infusions:   Principal Problem:   Hypoglycemia Active Problems:   History of seizures   DM (diabetes mellitus), type 2 with renal complications   DVT (deep venous thrombosis)   Unresponsive episode   Hypothermia   Diarrhea   Chronic kidney disease (CKD), stage IV (severe)   Clostridium difficile diarrhea    Conley Canal  [facility] Pager [phone]. If 7PM-7AM, please contact night-coverage at [email or web address], password [facility] 01/15/2014, 2:52 PM  LOS: 1 day    Patient seen and examined. Agree with PN done by Algis Downs, PA-C.  Patient would agree to go to SNF if needed.  Monitor on low dose lantus, need repeat chest x ray in 48 hours.  Belkys Regalado, Md.

## 2014-01-15 NOTE — Progress Notes (Signed)
ANTICOAGULATION CONSULT NOTE - Initial Consult  Pharmacy Consult for Lovenox, Coumadin Indication: VTE treatment  Allergies  Allergen Reactions  . Pork-Derived Conservation officer, natureroducts Hives and Other (See Comments)    The patient has tolerated lovenox    Patient Measurements: Height: 5\' 7"  (170.2 cm) Weight: 103 lb 2.8 oz (46.8 kg) IBW/kg (Calculated) : 66.1  Vital Signs: Temp: 97.7 F (36.5 C) (12/17 1039) Temp Source: Oral (12/17 1039) BP: 156/74 mmHg (12/17 1039) Pulse Rate: 66 (12/17 1039)  Labs:  Recent Labs  01/14/14 0850 01/15/14 0655  HGB 11.7* 11.3*  HCT 37.7* 35.0*  PLT 248 239  LABPROT 14.2 15.1  INR 1.08 1.18  CREATININE 3.77*  --     Estimated Creatinine Clearance: 14.3 mL/min (by C-G formula based on Cr of 3.77).   Medical History: Past Medical History  Diagnosis Date  . Hypertension   . CHF (congestive heart failure)   . Seizures   . Tuberculosis 1960's    "got the tx for it" (10/27/2013)  . Type II diabetes mellitus   . Hepatitis C   . Stroke 06/2013    "LLE paralyzed since" (10/27/2013)  . Arthritis     "left knee hurts" (10/27/2013)  . Chronic kidney disease (CKD), stage IV (severe)   . Hypoglycemia 11/03/2013  . Brittle diabetes   . DVT (deep venous thrombosis)   . Hx of medication noncompliance     Medications:  Scheduled:  . amLODipine  10 mg Oral Daily  . atorvastatin  20 mg Oral q1800  . calcitRIOL  0.25 mcg Oral Daily  . calcium acetate  667 mg Oral TID WC  . carvedilol  18.75 mg Oral BID WC  . enoxaparin (LOVENOX) injection  1 mg/kg Subcutaneous Q24H  . feeding supplement (NEPRO CARB STEADY)  237 mL Oral TID BM  . furosemide  80 mg Oral Daily  . insulin aspart  0-5 Units Subcutaneous TID WC  . insulin glargine  5 Units Subcutaneous QHS  . isosorbide mononitrate  60 mg Oral Daily  . sodium bicarbonate  1,300 mg Oral BID  . sodium chloride  3 mL Intravenous Q12H  . thiamine  100 mg Oral q morning - 10a  . vancomycin  125 mg Oral 4 times  per day  . Warfarin - Pharmacist Dosing Inpatient   Does not apply q1800    Assessment: 58yo male with VTE, on Coumadin 2mg  daily at home but has told the [facility] tech that he has not been taking.  INR 1.08 on admission.  Hg 11.7, pltc wnl.  Lovenox is being added to bridge pt while INR sub-therapeutic. INR today 1.18.    Goal of Therapy:  INR 2-3 Anti-Xa level 0.6-1 units/ml 4hrs after LMWH dose given Monitor platelets by anticoagulation protocol: Yes   Plan:  Lovenox 55mg  SQ daily, adjusted for renal fxn Coumadin 3mg  today Daily protime CBC q72 Watch for s/s of bleeding F/u education  Thanks for allowing pharmacy to be a part of this patient's care.  Talbert CageLora Dray Dente, PharmD Clinical Pharmacist, [phone]

## 2014-01-15 NOTE — Progress Notes (Signed)
Physical Therapy Evaluation Patient Details Name: [name] [name] Spatafore MRN: [id number] DOB: 1955/10/24 Today's Date: 01/15/2014   History of Present Illness  pt is a 58 y/o male With history of brittle diabetes and recurrent admissions for hypoglycemia, chronic kidney disease, history of stroke and seizures, hypertension found unresponsive by family members. Patient has a history of stroke and is difficult historian. Much of the history is per chart and patient's sister. When EMS arrived, blood glucose was 44.  Was given oral glucose. In ED, CBG 86. Hypothermic with rectal temp 95 degrees F.  Chest x-ray showed possible pneumonia versus pulmonary edema and effusion. CT chest however showed no infiltrate or pulmonary edema. Patient has had loose stool and recently completed a 10 day course of Flagyl for C. difficile colitis.  Reports diarrhea resolved, then recurred a few days ago.  No abdominal pain fevers. Appetite good. No vomiting. No cough or shortness of breath.  Patient's sister reports that his blood sugars are sometimes low in the mornings, in the 60s or 70s. Admitted for hypoglycemia, hypothermia, diarrhea. Now normothermic after getting bear hugger  Clinical Impression  Pt admitted with/for unresponsiveness due to hypoglycemia.  Pt currently limited functionally due to the problems listed below.  (see problems list.)  Pt will benefit from PT to maximize function and safety to be able to get home safely with available assist of family..     Follow Up Recommendations Home health PT;Supervision for mobility/OOB    Equipment Recommendations       Recommendations for Other Services       Precautions / Restrictions Precautions Precautions: Fall Restrictions Weight Bearing Restrictions: No      Mobility  Bed Mobility               General bed mobility comments: Pt in recliner upon PT arrival.   Transfers Overall transfer level: Needs assistance Equipment used: Rolling walker  (2 wheeled) Transfers: Sit to/from Stand Sit to Stand: Min guard         General transfer comment: Pt was able to power-up to full standing position without assistance. Pt demonstrated proper hand placement and safety awareness.   Ambulation/Gait Ambulation/Gait assistance: Min guard Ambulation Distance (Feet): 200 Feet Assistive device: Rolling walker (2 wheeled) Gait Pattern/deviations: Step-through pattern Gait velocity: Decreased   General Gait Details: hemiparetic gait on the R  Stairs            Wheelchair Mobility    Modified Rankin (Stroke Patients Only)       Balance Overall balance assessment: Needs assistance Sitting-balance support: No upper extremity supported;Feet supported Sitting balance-Leahy Scale: Fair     Standing balance support: No upper extremity supported Standing balance-Leahy Scale: Fair Standing balance comment: stood in RW and rubbed hands with hand gel before leaving the room                             Pertinent Vitals/Pain Pain Assessment: No/denies pain    Home Living Family/patient expects to be discharged to:: Private residence Living Arrangements: Other relatives (lives at sisteer's home with her and another brother) Available Help at Discharge: Family;Available 24 hours/day Type of Home: Apartment Home Access: Stairs to enter Entrance Stairs-Rails: Right;Left   Home Layout: Two level;Bed/bath upstairs Home Equipment: Cane - single point;Wheelchair - manual;Toilet riser;Shower seat;Walker - standard      Prior Function Level of Independence: Independent with assistive device(s)  Comments: No longer drives, not working. community ambulator, uses motorized cart when going to the store. States he uses his walker all the time..  Per sister, pt does not walk nearly enough.     Hand Dominance   Dominant Hand: Right    Extremity/Trunk Assessment               Lower Extremity Assessment:  Overall WFL for tasks assessed;RLE deficits/detail;LLE deficits/detail RLE Deficits / Details: WFL LLE Deficits / Details: little isolation of movements; grossly 4-to 4/5  Cervical / Trunk Assessment: Normal  Communication   Communication: No difficulties  Cognition Arousal/Alertness: Awake/alert Behavior During Therapy: WFL for tasks assessed/performed Overall Cognitive Status: Within Functional Limits for tasks assessed                      General Comments      Exercises        Assessment/Plan    PT Assessment Patient needs continued PT services  PT Diagnosis Generalized weakness;Other (comment) (hemiparesis on the left)   PT Problem List Decreased strength;Decreased activity tolerance;Decreased balance;Decreased mobility;Decreased coordination;Decreased knowledge of use of DME  PT Treatment Interventions DME instruction;Gait training;Functional mobility training;Therapeutic activities;Therapeutic exercise;Patient/family education;Neuromuscular re-education;Balance training   PT Goals (Current goals can be found in the Care Plan section) Acute Rehab PT Goals Patient Stated Goal: Be indpendent PT Goal Formulation: With patient Time For Goal Achievement: 01/29/14 Potential to Achieve Goals: Good    Frequency Min 3X/week   Barriers to discharge        Co-evaluation               End of Session   Activity Tolerance: Patient tolerated treatment well Patient left: in chair;with call bell/phone within reach;with family/visitor present Nurse Communication: Mobility status         Time: [id number] PT Time Calculation (min) (ACUTE ONLY): 32 min   Charges:   PT Evaluation $Initial PT Evaluation Tier I: 1 Procedure PT Treatments $Gait Training: 8-22 mins $Therapeutic Activity: 8-22 mins   PT G Codes:          Pau Banh, Eliseo GumKenneth V 01/15/2014, 3:29 PM 01/15/2014  [place] BingKen Oluwatimileyin [place], PT [phone]  (pager)

## 2014-01-15 NOTE — Progress Notes (Signed)
NP Craige CottaKirby notified concerning patient's b/p this am. Will await any further orders.

## 2014-01-15 NOTE — Progress Notes (Signed)
CRITICAL VALUE ALERT  Critical value received:  C-Diff PCR  Date of notification:  01/15/14  Time of notification:  0954  Critical value read back: Yes  Nurse who received alert:  Roselyn Reefiffany Zayden Hahne RN  MD notified (1st page):  Algis DownsMarianne York, [place]  Time of first page: 1008  MD notified (2nd page):  Time of second page:  Responding MD:  Algis DownsMarianne York, PA  Time MD responded:  1045 (Pt. Already being treated)

## 2014-01-16 LAB — CBC
HCT: 31.9 % — ABNORMAL LOW (ref 39.0–52.0)
HEMOGLOBIN: 10.5 g/dL — AB (ref 13.0–17.0)
MCH: 29.2 pg (ref 26.0–34.0)
MCHC: 32.9 g/dL (ref 30.0–36.0)
MCV: 88.9 fL (ref 78.0–100.0)
Platelets: 187 10*3/uL (ref 150–400)
RBC: 3.59 MIL/uL — ABNORMAL LOW (ref 4.22–5.81)
RDW: 16.3 % — ABNORMAL HIGH (ref 11.5–15.5)
WBC: 4.3 10*3/uL (ref 4.0–10.5)

## 2014-01-16 LAB — BASIC METABOLIC PANEL
ANION GAP: 11 (ref 5–15)
BUN: 47 mg/dL — ABNORMAL HIGH (ref 6–23)
CHLORIDE: 106 meq/L (ref 96–112)
CO2: 23 mEq/L (ref 19–32)
CREATININE: 4.04 mg/dL — AB (ref 0.50–1.35)
Calcium: 8.7 mg/dL (ref 8.4–10.5)
GFR calc non Af Amer: 15 mL/min — ABNORMAL LOW (ref >90)
GFR, EST AFRICAN AMERICAN: 17 mL/min — AB (ref >90)
Glucose, Bld: 110 mg/dL — ABNORMAL HIGH (ref 70–99)
Potassium: 5.2 mEq/L (ref 3.7–5.3)
SODIUM: 140 meq/L (ref 137–147)

## 2014-01-16 LAB — PROTIME-INR
INR: 1.19 (ref 0.00–1.49)
PROTHROMBIN TIME: 15.2 s (ref 11.6–15.2)

## 2014-01-16 LAB — BASIC METABOLIC PANEL WITH GFR
Anion gap: 13 (ref 5–15)
BUN: 46 mg/dL — ABNORMAL HIGH (ref 6–23)
CO2: 22 meq/L (ref 19–32)
Calcium: 8.8 mg/dL (ref 8.4–10.5)
Chloride: 105 meq/L (ref 96–112)
Creatinine, Ser: 4.03 mg/dL — ABNORMAL HIGH (ref 0.50–1.35)
GFR calc Af Amer: 18 mL/min — ABNORMAL LOW
GFR calc non Af Amer: 15 mL/min — ABNORMAL LOW
Glucose, Bld: 131 mg/dL — ABNORMAL HIGH (ref 70–99)
Potassium: 5.1 meq/L (ref 3.7–5.3)
Sodium: 140 meq/L (ref 137–147)

## 2014-01-16 LAB — URINALYSIS, ROUTINE W REFLEX MICROSCOPIC
Bilirubin Urine: NEGATIVE
GLUCOSE, UA: NEGATIVE mg/dL
KETONES UR: NEGATIVE mg/dL
Leukocytes, UA: NEGATIVE
NITRITE: NEGATIVE
Protein, ur: >300 mg/dL — AB
Specific Gravity, Urine: 1.015 (ref 1.005–1.030)
Urobilinogen, UA: 0.2 mg/dL (ref 0.0–1.0)
pH: 6.5 (ref 5.0–8.0)

## 2014-01-16 LAB — GLUCOSE, CAPILLARY
GLUCOSE-CAPILLARY: 105 mg/dL — AB (ref 70–99)
Glucose-Capillary: 125 mg/dL — ABNORMAL HIGH (ref 70–99)
Glucose-Capillary: 203 mg/dL — ABNORMAL HIGH (ref 70–99)

## 2014-01-16 LAB — URINE MICROSCOPIC-ADD ON

## 2014-01-16 MED ORDER — FUROSEMIDE 80 MG PO TABS
160.0000 mg | ORAL_TABLET | Freq: Two times a day (BID) | ORAL | Status: DC
  Administered 2014-01-16 – 2014-01-18 (×4): 160 mg via ORAL
  Filled 2014-01-16 (×6): qty 2

## 2014-01-16 MED ORDER — AMLODIPINE BESYLATE 10 MG PO TABS
10.0000 mg | ORAL_TABLET | Freq: Every day | ORAL | Status: DC
Start: 2014-01-17 — End: 2014-01-18
  Administered 2014-01-17: 10 mg via ORAL
  Filled 2014-01-16 (×2): qty 1

## 2014-01-16 MED ORDER — DARBEPOETIN ALFA 100 MCG/0.5ML IJ SOSY
100.0000 ug | PREFILLED_SYRINGE | INTRAMUSCULAR | Status: DC
  Administered 2014-01-16: 100 ug via SUBCUTANEOUS
  Filled 2014-01-16 (×2): qty 0.5

## 2014-01-16 MED ORDER — WARFARIN SODIUM 4 MG PO TABS
4.0000 mg | ORAL_TABLET | Freq: Once | ORAL | Status: AC
  Administered 2014-01-16: 4 mg via ORAL
  Filled 2014-01-16: qty 1

## 2014-01-16 MED ORDER — HYDRALAZINE HCL 25 MG PO TABS
25.0000 mg | ORAL_TABLET | Freq: Three times a day (TID) | ORAL | Status: DC
  Filled 2014-01-16 (×3): qty 1

## 2014-01-16 NOTE — Discharge Instructions (Signed)
Information on my medicine - Coumadin   (Warfarin)  This medication education was reviewed with me or my healthcare representative as part of my discharge preparation.  The pharmacist that spoke with me during my hospital stay was:  Eugene Garnetotter, Rayland Hamed Sue, [facility]  Why was Coumadin prescribed for you? Coumadin was prescribed for you because you have a blood clot or a medical condition that can cause an increased risk of forming blood clots. Blood clots can cause serious health problems by blocking the flow of blood to the heart, lung, or brain. Coumadin can prevent harmful blood clots from forming. As a reminder your indication for Coumadin is:   Select from menu  What test will check on my response to Coumadin? While on Coumadin (warfarin) you will need to have an INR test regularly to ensure that your dose is keeping you in the desired range. The INR (international normalized ratio) number is calculated from the result of the laboratory test called prothrombin time (PT).  If an INR APPOINTMENT HAS NOT ALREADY BEEN MADE FOR YOU please schedule an appointment to have this lab work done by your health care provider within 7 days. Your INR goal is usually a number between:  2 to 3 or your provider may give you a more narrow range like 2-2.5.  Ask your health care provider during an office visit what your goal INR is.  What  do you need to  know  About  COUMADIN? Take Coumadin (warfarin) exactly as prescribed by your healthcare provider about the same time each day.  DO NOT stop taking without talking to the doctor who prescribed the medication.  Stopping without other blood clot prevention medication to take the place of Coumadin may increase your risk of developing a new clot or stroke.  Get refills before you run out.  What do you do if you miss a dose? If you miss a dose, take it as soon as you remember on the same day then continue your regularly scheduled regimen the next day.  Do not take two doses  of Coumadin at the same time.  Important Safety Information A possible side effect of Coumadin (Warfarin) is an increased risk of bleeding. You should call your healthcare provider right away if you experience any of the following: ? Bleeding from an injury or your nose that does not stop. ? Unusual colored urine (red or dark brown) or unusual colored stools (red or black). ? Unusual bruising for unknown reasons. ? A serious fall or if you hit your head (even if there is no bleeding).  Some foods or medicines interact with Coumadin (warfarin) and might alter your response to warfarin. To help avoid this: ? Eat a balanced diet, maintaining a consistent amount of Vitamin K. ? Notify your provider about major diet changes you plan to make. ? Avoid alcohol or limit your intake to 1 drink for women and 2 drinks for men per day. (1 drink is 5 oz. wine, 12 oz. beer, or 1.5 oz. liquor.)  Make sure that ANY health care provider who prescribes medication for you knows that you are taking Coumadin (warfarin).  Also make sure the healthcare provider who is monitoring your Coumadin knows when you have started a new medication including herbals and non-prescription products.  Coumadin (Warfarin)  Major Drug Interactions  Increased Warfarin Effect Decreased Warfarin Effect  Alcohol (large quantities) Antibiotics (esp. Septra/Bactrim, Flagyl, Cipro) Amiodarone (Cordarone) Aspirin (ASA) Cimetidine (Tagamet) Megestrol (Megace) NSAIDs (ibuprofen, naproxen, etc.) Piroxicam (  Feldene) °Propafenone (Rythmol SR) °Propranolol (Inderal) °Isoniazid (INH) °Posaconazole (Noxafil) Barbiturates (Phenobarbital) °Carbamazepine (Tegretol) °Chlordiazepoxide (Librium) °Cholestyramine (Questran) °Griseofulvin °Oral Contraceptives °Rifampin °Sucralfate (Carafate) °Vitamin K  ° °Coumadin® (Warfarin) Major Herbal Interactions  °Increased Warfarin Effect Decreased Warfarin Effect  °Garlic °Ginseng °Ginkgo biloba Coenzyme  Q10 °Green tea °St. John’s wort   ° °Coumadin® (Warfarin) FOOD Interactions  °Eat a consistent number of servings per week of foods HIGH in Vitamin K °(1 serving = ½ cup)  °Collards (cooked, or boiled & drained) °Kale (cooked, or boiled & drained) °Mustard greens (cooked, or boiled & drained) °Parsley *serving size only = ¼ cup °Spinach (cooked, or boiled & drained) °Swiss chard (cooked, or boiled & drained) °Turnip greens (cooked, or boiled & drained)  °Eat a consistent number of servings per week of foods MEDIUM-HIGH in Vitamin K °(1 serving = 1 cup)  °Asparagus (cooked, or boiled & drained) °Broccoli (cooked, boiled & drained, or raw & chopped) °Brussel sprouts (cooked, or boiled & drained) *serving size only = ½ cup °Lettuce, raw (green leaf, endive, romaine) °Spinach, raw °Turnip greens, raw & chopped  ° °These websites have more information on Coumadin (warfarin):  www.coumadin.com; °www.ahrq.gov/consumer/coumadin.htm; ° ° °

## 2014-01-16 NOTE — Care Management Note (Unsigned)
    Page 1 of 1   01/16/2014     3:41:38 PM CARE MANAGEMENT NOTE 01/16/2014  [facility]  [name]   Account Number:  [id number]  Date Initiated:  01/16/2014  Documentation initiated by:  Letha CapeAYLOR,Judene [name]  Subjective/Objective Assessment:   dx bil pl effusion, c diff, rue dvt, hypoglycemia  admit- lives with sister     Action/Plan:   Anticipated DC Date:  01/17/2014   Anticipated DC Plan:  SKILLED NURSING FACILITY  In-house referral  Clinical Social Worker      DC Planning Services  CM consult      Choice offered to / List presented to:             Status of service:  In process, will continue to follow Medicare Important Message given?  YES (If response is "NO", the following Medicare IM given date fields will be blank) Date Medicare IM given:  01/16/2014 Medicare IM given by:  Letha CapeAYLOR,[name] [name] Date Additional Medicare IM given:   Additional Medicare IM given by:    Discharge Disposition:    Per UR Regulation:  Reviewed for med. necessity/level of care/duration of stay  If discussed at Long Length of Stay Meetings, dates discussed:    Comments:  01/16/14 1540 Letha Capeeborah Amaira Safley RN, BSN [phone] patient is for dc this weekend to snf, CSW following.

## 2014-01-16 NOTE — Progress Notes (Signed)
ANTICOAGULATION CONSULT NOTE - Follow Up Consult  Pharmacy Consult for Coumadin Indication: RUE DVT  Allergies  Allergen Reactions  . Pork-Derived Conservation officer, natureroducts Hives and Other (See Comments)    The patient has tolerated lovenox    Patient Measurements: Height: 5\' 7"  (170.2 cm) Weight: 103 lb 2.8 oz (46.8 kg) IBW/kg (Calculated) : 66.1 Heparin Dosing Weight:   Vital Signs: Temp: 98.3 F (36.8 C) (12/18 0634) Temp Source: Oral (12/18 0634) BP: 184/87 mmHg (12/18 0731) Pulse Rate: 71 (12/18 0731)  Labs:  Recent Labs  01/14/14 0850 01/15/14 0655 01/16/14 0605  HGB 11.7* 11.3* 10.5*  HCT 37.7* 35.0* 31.9*  PLT 248 239 187  LABPROT 14.2 15.1 15.2  INR 1.08 1.18 1.19  CREATININE 3.77*  --  4.04*    Estimated Creatinine Clearance: 13.4 mL/min (by C-G formula based on Cr of 4.04).   Medications:  Scheduled:  . amLODipine  10 mg Oral Daily  . atorvastatin  20 mg Oral q1800  . calcitRIOL  0.25 mcg Oral Daily  . calcium acetate  667 mg Oral TID WC  . carvedilol  18.75 mg Oral BID WC  . enoxaparin (LOVENOX) injection  1 mg/kg Subcutaneous Q24H  . feeding supplement (NEPRO CARB STEADY)  237 mL Oral TID BM  . furosemide  80 mg Oral Daily  . insulin aspart  0-5 Units Subcutaneous TID WC  . insulin glargine  5 Units Subcutaneous QHS  . isosorbide mononitrate  60 mg Oral Daily  . sodium bicarbonate  1,300 mg Oral BID  . sodium chloride  3 mL Intravenous Q12H  . thiamine  100 mg Oral q morning - 10a  . vancomycin  125 mg Oral 4 times per day  . Warfarin - Pharmacist Dosing Inpatient   Does not apply q1800    Assessment: 58yo male with RUE VTE, continuing Coumadin from but requiring Lovenox bridge due to sub-therapeutic INR as pt had not been taking his Coumadin.  INR 1.19 this AM, moving slowly.  Hg 10.5, pltc 187.  No bleeding noted.  Goal of Therapy:  INR 2-3 Monitor platelets by anticoagulation protocol: Yes   Plan:  Coumadin 4mg  today Continue Lovenox 1mg /kg SQ  q24, adjusted for renal fxn Watch for s/s bleeding F/U in AM  Marisue HumbleKendra Tanaja Ganger, PharmD Clinical Pharmacist [facility]

## 2014-01-16 NOTE — Progress Notes (Signed)
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 Patient has a history of stroke and is difficult historian. Much of the history is per chart and patient's sister. When EMS arrived, blood glucose was 44.  Was given oral glucose. In ED, CBG 86. Hypothermic with rectal temp 95 degrees F.  Chest x-ray showed possible pneumonia versus pulmonary edema and effusion. CT chest however showed no infiltrate or pulmonary edema. Patient has had loose stool and recently completed a 10 day course of Flagyl for C. difficile colitis.  Reports diarrhea resolved, then recurred a few days ago.  No abdominal pain fevers. Appetite good. No vomiting. No cough or shortness of breath.  Patient's sister reports that his blood sugars are sometimes low in the mornings, in the 60s or 70s. Admitted for hypoglycemia, hypothermia, diarrhea. Now normothermic after getting bear hugger    PT Comments    Pt progressing with gait with RW and is currently at S level.  Min A for bed mobility with HOB flat and without rails to simulate home.  Requires min A on stairs with several instances of LOB.  Recommend 24/7 S for this pt at this time.  Pt states that he has 24/7 from brother and sister, however spoke with MD following treatment session and note that sister is no longer willing to provide 24/7, therefore recommend ST SNF for increased independence and decreased burden of care.    Follow Up Recommendations  SNF;Supervision/Assistance - 24 hour     Equipment Recommendations  Rolling walker with 5" wheels    Recommendations for Other Services       Precautions / Restrictions Precautions Precautions: Fall Restrictions Weight  Bearing Restrictions: No    Mobility  Bed Mobility Overal bed mobility: Needs Assistance Bed Mobility: Supine to Sit     Supine to sit: Min assist     General bed mobility comments: Pt states he has something to grab onto at home (not rail), however had pt perform bed mobiltiy with HOB flat and without rails to better simulate home.  He requires min A despite being able to roll on his own, needed assist to push up into sittig.   Transfers Overall transfer level: Needs assistance Equipment used: Rolling walker (2 wheeled) Transfers: Sit to/from Stand Sit to Stand: Supervision         General transfer comment: Pt with min cues for controlled desecnt when sitting, but safe when standing with S for safety.   Ambulation/Gait Ambulation/Gait assistance: Supervision Ambulation Distance (Feet): 150 Feet Assistive device: Rolling walker (2 wheeled) Gait Pattern/deviations: Step-to pattern;Decreased stance time - left;Decreased stride length;Trunk flexed;Narrow base of support Gait velocity: Decreased   General Gait Details: Pt tends to ambulate on L forefoot with flexed knee.  States that he tries to perform heel to toe gait, however states "I can't" right now.  No sign of buckling, but states he is gettig a knee brace.    Stairs Stairs: Yes Stairs assistance: Min assist Stair Management: One rail Right;Step to pattern;Forwards Number of Stairs: 11 General stair comments: Pt states he has a wall on each side to hold onto at home, therefore attempted to simulate him holding onto wall on R and L HHA, however pt with difficulty following  command for task.  Still requires min A for safety with several LOB on stairs.  Highly recommend he have someone with him when performing stairs.    Wheelchair Mobility    Modified Rankin (Stroke Patients Only)       Balance Overall balance assessment: Needs assistance Sitting-balance support: Feet supported Sitting balance-Leahy Scale: Good      Standing balance support: During functional activity;Bilateral upper extremity supported Standing balance-Leahy Scale: Fair Standing balance comment: Requires UE support on RW to maintain balance.                     Cognition Arousal/Alertness: Awake/alert Behavior During Therapy: WFL for tasks assessed/performed Overall Cognitive Status: No family/caregiver present to determine baseline cognitive functioning                      Exercises      General Comments        Pertinent Vitals/Pain Pain Assessment: No/denies pain    Home Living                      Prior Function            PT Goals (current goals can now be found in the care plan section) Acute Rehab PT Goals Patient Stated Goal: Be indpendent PT Goal Formulation: With patient Time For Goal Achievement: 01/29/14 Potential to Achieve Goals: Good Progress towards PT goals: Progressing toward goals    Frequency  Min 3X/week    PT Plan Discharge plan needs to be updated    Co-evaluation             End of Session   Activity Tolerance: Patient limited by fatigue Patient left: in chair;with call bell/phone within reach;with family/visitor present     Time: [id number] PT Time Calculation (min) (ACUTE ONLY): 35 min  Charges:  $Gait Training: 23-37 mins                    G Codes:      Vista Deckarcell, Hanadi Stanly Ann 01/16/2014, 9:21 AM

## 2014-01-16 NOTE — Progress Notes (Signed)
Patient's systolic was greater then 180 this am. Given 10mg  of hydralazine PRN. Day RN notified- day RN will reassess patient's b/p.

## 2014-01-16 NOTE — Consult Note (Signed)
Reason for Consult:CKD 5 Referring Physician: Dr. Parks Neptune [name] [name] is an 58 y.o. male.  HPI: 58 yr male with appox 64 yr DM, similar duration HTN, Hx L body CVA, Hep C, Smoking >40 pak yr.  Has brittle DM.  CKD5 with baseline Cr in 5s.  Has AVG place LUA 09/11/13.  Assoc Anemia and 2HPTH also, both under active tx.  Has brittle DM , and admitted with hypoglycemia now and C diff.  Has C. Diff last mon also. We saw him 12/01/13 in office.  Hx of severe nonadherence (per sister who cares for him).        Cr now 4, secondary to fluid xs, and has R effusion and edema.  No N, V, or itching but appetite marginal.  No PND or othop, or CP.  Still smoking Constitutional: as above, no lethargy or malaise but sister says he does little Eyes: recent surgery for R eye bleed.  poor vision on L Ears, nose, mouth, throat, and face: negative Respiratory: coughs up whitish phlegm Cardiovascular: as above, does have edema Gastrointestinal: no N, V but having D Genitourinary:negative Integument/breast: negative Hematologic/lymphatic: on q 2 wk Procrit Musculoskeletal:knees ache Neurological: L side weakness, L leg> L arm Endocrine: as above Allergic/Immunologic: ? pork products    Primary Nephrologist [name] [name]. .  Access LUA AVG 4 mon old.  Past Medical History  Diagnosis Date  . Hypertension   . CHF (congestive heart failure)   . Seizures   . Tuberculosis 1960's    "got the tx for it" (10/27/2013)  . Type II diabetes mellitus   . Hepatitis C   . Stroke 06/2013    "LLE paralyzed since" (10/27/2013)  . Arthritis     "left knee hurts" (10/27/2013)  . Chronic kidney disease (CKD), stage IV (severe)   . Hypoglycemia 11/03/2013  . Brittle diabetes   . DVT (deep venous thrombosis)   . Hx of medication noncompliance     Past Surgical History  Procedure Laterality Date  . Av fistula placement Left 09/11/2013    Procedure: Insertion of arteriovenous gortex graft;  Surgeon: Angelia Mould, MD;  Location: [facility];  Service: Vascular;  Laterality: Left;  . Laceration repair Left ?2014    "cut my leg falling off a truck"  . Loop recorder implant  05/2012    Family History  Problem Relation Age of Onset  . COPD Mother   . Hypertension Mother   . Diabetes Mother     Social History:  reports that he has been smoking Cigarettes.  He has a 10.5 pack-year smoking history. He has never used smokeless tobacco. He reports that he does not drink alcohol or use illicit drugs.  Allergies:  Allergies  Allergen Reactions  . Pork-Derived Metallurgist and Other (See Comments)    The patient has tolerated lovenox    Medications:  I have reviewed the patient's current medications. Prior to Admission:  Prescriptions prior to admission  Medication Sig Dispense Refill Last Dose  . amLODipine (NORVASC) 10 MG tablet Take 10 mg by mouth daily.   Past Month at Unknown time  . aspirin 81 MG tablet Take 81 mg by mouth daily.   01/13/2014 at Unknown time  . atorvastatin (LIPITOR) 20 MG tablet Take 1 tablet (20 mg total) by mouth daily at 6 PM. 30 tablet 0 01/13/2014 at Unknown time  . calcitRIOL (ROCALTROL) 0.25 MCG capsule Take 1 capsule (0.25 mcg total) by mouth daily. 30 capsule 0 01/13/2014  at Unknown time  . calcium acetate (PHOSLO) 667 MG capsule Take 667 mg by mouth 3 (three) times daily with meals.   01/13/2014 at Unknown time  . carvedilol (COREG) 6.25 MG tablet Take 3 tablets (18.75 mg total) by mouth 2 (two) times daily with a meal. 180 tablet 0 01/13/2014 at 2000  . glucose blood test strip Use as instructed 100 each 12 Taking  . insulin aspart (NOVOLOG) 100 UNIT/ML injection Inject 0-12 Units into the skin 3 (three) times daily with meals. SSI  BS  150-200  2 u    To be given SQ                 201-250  4 u                 251-300  6 u                 301-350  8 u                  351-400  10 u                   > 400  12 u and call MD   01/13/2014 at Unknown time  .  insulin glargine (LANTUS) 100 UNIT/ML injection Inject 0.12 mLs (12 Units total) into the skin at bedtime. 10 mL 11 01/13/2014 at Unknown time  . isosorbide mononitrate (IMDUR) 60 MG 24 hr tablet Take 1 tablet (60 mg total) by mouth daily. 30 tablet 0 01/13/2014 at Unknown time  . Nutritional Supplements (FEEDING SUPPLEMENT, NEPRO CARB STEADY,) LIQD Take 237 mLs by mouth 3 (three) times daily between meals. 90 Can 0 Past Month at Unknown time  . ranitidine (ZANTAC) 150 MG tablet Take 150 mg by mouth daily.   01/13/2014 at Unknown time  . sevelamer carbonate (RENVELA) 800 MG tablet Take 1,600 mg by mouth 3 (three) times daily with meals.   01/13/2014 at Unknown time  . sodium bicarbonate 650 MG tablet Take 2 tablets (1,300 mg total) by mouth 2 (two) times daily. (Patient taking differently: Take 650 mg by mouth 4 (four) times daily. ) 120 tablet 1 01/13/2014 at Unknown time  . thiamine (VITAMIN B-1) 100 MG tablet Take 100 mg by mouth every morning.   01/13/2014 at Unknown time  . acetaminophen (TYLENOL) 325 MG tablet Take 2 tablets (650 mg total) by mouth every 6 (six) hours as needed for mild pain (or Fever >/= 101). (Patient not taking: Reported on 01/14/2014)   Not Taking at Unknown time  . furosemide (LASIX) 80 MG tablet Take 1 tablet (80 mg total) by mouth daily. (Patient not taking: Reported on 01/14/2014) 30 tablet 0 Not Taking at Unknown time  . metroNIDAZOLE (FLAGYL) 500 MG tablet Take 1 tablet (500 mg total) by mouth every 8 (eight) hours. (Patient not taking: Reported on 01/14/2014) 16 tablet 0 Completed Course at Unknown time  . warfarin (COUMADIN) 2 MG tablet Take 1 tablet (2 mg total) by mouth daily. (Patient not taking: Reported on 01/14/2014) 5 tablet 0 Not Taking at Unknown time   , Calcitriol .69mg po qd , Procrit 30000 Units SQ q 2 wk, .  Results for orders placed or performed during the hospital encounter of 01/14/14 (from the past 48 hour(s))  CBG monitoring, ED     Status:  Abnormal   Collection Time: 01/14/14 12:17 PM  Result Value Ref Range   Glucose-Capillary 147 (H) 70 -  99 mg/dL  Glucose, capillary     Status: Abnormal   Collection Time: 01/14/14  1:13 PM  Result Value Ref Range   Glucose-Capillary 134 (H) 70 - 99 mg/dL  Glucose, capillary     Status: Abnormal   Collection Time: 01/14/14  4:53 PM  Result Value Ref Range   Glucose-Capillary 112 (H) 70 - 99 mg/dL  Clostridium Difficile by PCR     Status: Abnormal   Collection Time: 01/14/14  5:40 PM  Result Value Ref Range   C difficile by pcr POSITIVE (A) NEGATIVE    Comment: CRITICAL RESULT CALLED TO, READ BACK BY AND VERIFIED WITH: LUCAS RN 9:55 01/15/14 ([email or web address])   Glucose, capillary     Status: Abnormal   Collection Time: 01/14/14  9:19 PM  Result Value Ref Range   Glucose-Capillary 221 (H) 70 - 99 mg/dL  CBC     Status: Abnormal   Collection Time: 01/15/14  6:55 AM  Result Value Ref Range   WBC 5.1 4.0 - 10.5 K/uL   RBC 3.91 (L) 4.22 - 5.81 MIL/uL   Hemoglobin 11.3 (L) 13.0 - 17.0 g/dL   HCT 35.0 (L) 39.0 - 52.0 %   MCV 89.5 78.0 - 100.0 fL   MCH 28.9 26.0 - 34.0 pg   MCHC 32.3 30.0 - 36.0 g/dL   RDW 16.7 (H) 11.5 - 15.5 %   Platelets 239 150 - 400 K/uL  Protime-INR     Status: None   Collection Time: 01/15/14  6:55 AM  Result Value Ref Range   Prothrombin Time 15.1 11.6 - 15.2 seconds   INR 1.18 0.00 - 1.49  Glucose, capillary     Status: Abnormal   Collection Time: 01/15/14  8:02 AM  Result Value Ref Range   Glucose-Capillary 102 (H) 70 - 99 mg/dL  Glucose, capillary     Status: Abnormal   Collection Time: 01/15/14 11:44 AM  Result Value Ref Range   Glucose-Capillary 121 (H) 70 - 99 mg/dL  Glucose, capillary     Status: Abnormal   Collection Time: 01/15/14  5:10 PM  Result Value Ref Range   Glucose-Capillary 166 (H) 70 - 99 mg/dL  Glucose, capillary     Status: Abnormal   Collection Time: 01/15/14  9:20 PM  Result Value Ref Range   Glucose-Capillary 171 (H) 70 - 99 mg/dL    Comment 1 Documented in Chart    Comment 2 Notify RN   Protime-INR     Status: None   Collection Time: 01/16/14  6:05 AM  Result Value Ref Range   Prothrombin Time 15.2 11.6 - 15.2 seconds   INR 1.19 0.00 - 3.73  Basic metabolic panel     Status: Abnormal   Collection Time: 01/16/14  6:05 AM  Result Value Ref Range   Sodium 140 137 - 147 mEq/L   Potassium 5.2 3.7 - 5.3 mEq/L   Chloride 106 96 - 112 mEq/L   CO2 23 19 - 32 mEq/L   Glucose, Bld 110 (H) 70 - 99 mg/dL   BUN 47 (H) 6 - 23 mg/dL   Creatinine, Ser 4.04 (H) 0.50 - 1.35 mg/dL   Calcium 8.7 8.4 - 10.5 mg/dL   GFR calc non Af Amer 15 (L) >90 mL/min   GFR calc Af Amer 17 (L) >90 mL/min    Comment: (NOTE) The eGFR has been calculated using the CKD EPI equation. This calculation has not been validated in all clinical situations. eGFR's persistently <90 mL/min  signify possible Chronic Kidney Disease.    Anion gap 11 5 - 15  CBC     Status: Abnormal   Collection Time: 01/16/14  6:05 AM  Result Value Ref Range   WBC 4.3 4.0 - 10.5 K/uL   RBC 3.59 (L) 4.22 - 5.81 MIL/uL   Hemoglobin 10.5 (L) 13.0 - 17.0 g/dL   HCT 31.9 (L) 39.0 - 52.0 %   MCV 88.9 78.0 - 100.0 fL   MCH 29.2 26.0 - 34.0 pg   MCHC 32.9 30.0 - 36.0 g/dL   RDW 16.3 (H) 11.5 - 15.5 %   Platelets 187 150 - 400 K/uL  Glucose, capillary     Status: Abnormal   Collection Time: 01/16/14  8:01 AM  Result Value Ref Range   Glucose-Capillary 105 (H) 70 - 99 mg/dL    Ct Head Wo Contrast  01/14/2014   CLINICAL DATA:  Unresponsive.  EXAM: CT HEAD WITHOUT CONTRAST  TECHNIQUE: Contiguous axial images were obtained from the base of the skull through the vertex without intravenous contrast.  COMPARISON:  CT head 11/03/2013  FINDINGS: Generalized atrophy. Chronic microvascular ischemic changes in the white matter. Negative for acute infarct. Negative for hemorrhage or mass.  Atherosclerotic calcification.  Calvarium is intact.  IMPRESSION: Atrophy and chronic microvascular  ischemia.  No acute abnormality   Electronically Signed   By: Franchot Gallo M.D.   On: 01/14/2014 16:10   Ct Chest Wo Contrast  01/14/2014   CLINICAL DATA:  58 year old male with hypoglycemia and chest congestion. Initial encounter.  EXAM: CT CHEST WITHOUT CONTRAST  TECHNIQUE: Multidetector CT imaging of the chest was performed following the standard protocol without IV contrast.  COMPARISON:  Portable chest radiograph 0940 hr today and earlier.  FINDINGS: Large bilateral layering pleural effusions. No pericardial effusion.  Compressive atelectasis in both lungs. Some air bronchograms in both lower lobes. Major airways are patent except for a atelectatic changes. Mild mostly depending ground-glass opacity in both upper lobes.  No acute osseous abnormality identified. Left chest cardiac event recorder.  Widespread calcified atherosclerosis in the upper abdomen. The thoracic aorta is relatively spared. No mediastinal lymphadenopathy. No axillary lymphadenopathy. Negative non contrast thoracic inlet.  Negative non contrast liver, spleen, pancreas, adrenal glands, kidneys, and bowel in the upper abdomen.  IMPRESSION: 1. Dominant abnormality is large bilateral layering pleural effusions with compressive atelectasis in both lungs. 2. No superimposed pneumonia, and no definite pulmonary edema at this time.   Electronically Signed   By: Lars Pinks M.D.   On: 01/14/2014 13:18    ROS Blood pressure 151/81, pulse 71, temperature 98.3 F (36.8 C), temperature source Oral, resp. rate 22, height _0  (1.702 m), weight 46.8 kg (103 lb 2.8 oz), SpO2 90 %. 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  Mild  L facial Musculoskeletal - atrophy of hand ms L worse than R Extremities - AVG LUA B&T Skin - trophic changes on feet , erosions on shins  Assessment/Plan: 1 CKD 5 stable to dilution effect with Cr. Needs ^ dose diuretics to help with lower vol and BP.   Acid base and K ok, not uremic 2 C diff per primary 3 Hypertension: needs lower vol and to time meds 4. Anemia restart epo and check Fe 5. Metabolic Bone Disease: cont Vit D 6 CVA  7 Brittle DM worsened with C diff and nonadherence. 8 Hep C P ^ lasix, give epo, adjust bp meds,   Zyanna Leisinger L 01/16/2014, 11:46 AM

## 2014-01-16 NOTE — Progress Notes (Signed)
PROGRESS NOTE  [name] [name] ZOX:[id number] DOB: Apr 27, 1955 DOA: 01/14/2014 PCP: Lonia Blood, MD  58 year old male with recurrent admissions for hypoglycemia in the setting of brittle diabetes, he has a history of chronic kidney disease, stroke, seizures, and hypertension.  He was found unresponsive by his sister, who called EMS.  On evaluation he had a CBG of 44.  In the ER he was found to be hypothermic with diarrhea.  Per the history, he had recently finished a course of antibiotics for C. difficile.   Assessment/Plan:  Hypoglycemia in the setting of brittle diabetes Lantus has been decreased, CBGs are now stable.  Hgb A1C 9.3 Monitor on low dose lantus and SSI   C. difficile diarrhea He is C. difficile PCR positive.  It appears his recent C-diff was inadequately treated.  Patient was having 6-8 watery bowel movements daily. He is slowly improving.  Oral vancomycin started on admission   Pleural Effusions Imaging shows large bilateral layering pleural effusions that were not present on CXR in 10/2013.  Suspect they are due to worsening renal function.  Patient complains of fatigue. Oxygen sats are 90 - 91% on room air.    Will not increase lasix dose at this point for fear of worsening his creatinine.  Appreciate Renal consult for diuresis (and at family's request).  Repeat CXR in am.   Rising potassium Patient's K rose from 4.5 - 5.2 with no supplementation while having diarrhea.  Will monitor closely.  Place on renal diet.   HTN On home meds. (coreg, amlodipine, imdur, lasix).  BP elevated.  Would ideally like to add additional diuretic.  PRN Hydralazine on order.  Appreciate Renal's opinion.  Will discuss with attending.   Anorexia Likely with weight loss.  Patient denies dysphagia/odynophagia.  "I'm just not hungry".  Will ask nutrition to see.   Hypothermia Resolved with bear hugger   Question of seizures Sister reports staring episodes that last 10 minutes  during which she drools and has incontinence. EEG was obtained and is normal. CT head is negative for acute changes. Neurology, Dr. Thad Ranger, curb sided. She advised that he seek outpatient follow-up once his CBGs have stabilized and his C. difficile has cleared if he is still having these episodes. No staring episodes noted during this admission.    History of CVA Residual left-sided weakness. PT recommends 24 hours supervision.  Patient is agreeable to go to SNF for rehab.   History of chronic kidney disease stage IV Creatinine appears stable. Patient noted to have fistula in place.  Baseline creatinine approximately 3.7 Monitor urine out put. Strict I and O. Will probably need to increase lasix to help with diuresis, when diarrhea improved and if renal function allows it.   Family, Park Meo, reports that [name] sees Dr. Darrick Penna and requests his consultation.   Recent history of right upper extremity DVT (09/2013) On Coumadin, INR subtherapeutic. Warfarin and Lovenox per pharmacy.   UTI? UA with many bacteria but negative nitrites or leukocytes. Urine culture pending. Not currently on antibiotics for UTI.     DVT Prophylaxis:  Lovenox and warfarin  Code Status: Full code Family Communication:  Spoke with sister Celeste at patient's room Disposition Plan: Inpatient. Family requesting snf.  Patient agrees.  Consultants:  Renal  Procedures:  EEG  Antibiotics: Anti-infectives    Start     Dose/Rate Route Frequency Ordered Stop   01/14/14 1500  vancomycin (VANCOCIN) 50 mg/mL oral solution 125 mg     125 mg Oral  4 times per day 01/14/14 1417     01/14/14 1045  ceFEPIme (MAXIPIME) 1 g in dextrose 5 % 50 mL IVPB  Status:  Discontinued     1 g100 mL/hr over 30 Minutes Intravenous  Once 01/14/14 1044 01/14/14 1057        HPI/Subjective: Reports his diarrhea is getting better.   Objective: Filed Vitals:   01/16/14 0159 01/16/14 0634 01/16/14 0635 01/16/14 0731    BP: 163/76 186/90 183/89 184/87  Pulse: 63 70 70 71  Temp: 97.3 F (36.3 C) 98.3 F (36.8 C)    TempSrc: Oral Oral    Resp: 20 20 22    Height:      Weight:      SpO2: 96% 91% 90%     Intake/Output Summary (Last 24 hours) at 01/16/14 1012 Last data filed at 01/16/14 0935  Gross per 24 hour  Intake    240 ml  Output    950 ml  Net   -710 ml   Filed Weights   01/14/14 0836 01/14/14 1308 01/15/14 0500  Weight: 54.432 kg (120 lb) 53.7 kg (118 lb 6.2 oz) 46.8 kg (103 lb 2.8 oz)    Exam: General: Thin, AAA, male, NAD, appears older than stated age  HEENT:  EOMI, Anicteic Sclera, MMM. No pharyngeal erythema or exudates  Neck: Supple, no JVD, no masses  Cardiovascular: RRR, S1 S2 auscultated, no rubs, murmurs or gallops.   Respiratory: Clear to auscultation bilaterally with equal chest rise  Abdomen: thin, soft, nontender, nondistended, + bowel sounds  Extremities: warm dry without cyanosis clubbing or edema.  Neuro: AAOx3, cranial nerves grossly intact. Right sided extremities notably stronger than left but 5/5 strength in each. Skin: Without rashes exudates or nodules.     Data Reviewed: Basic Metabolic Panel:  Recent Labs Lab 01/14/14 0850 01/16/14 0605  NA 141 140  K 4.5 5.2  CL 107 106  CO2 22 23  GLUCOSE 65* 110*  BUN 49* 47*  CREATININE 3.77* 4.04*  CALCIUM 8.8 8.7   CBC:  Recent Labs Lab 01/14/14 0850 01/15/14 0655 01/16/14 0605  WBC 5.2 5.1 4.3  NEUTROABS 3.5  --   --   HGB 11.7* 11.3* 10.5*  HCT 37.7* 35.0* 31.9*  MCV 89.8 89.5 88.9  PLT 248 239 187   CBG:  Recent Labs Lab 01/15/14 0802 01/15/14 1144 01/15/14 1710 01/15/14 2120 01/16/14 0801  GLUCAP 102* 121* 166* 171* 105*    Recent Results (from the past 240 hour(s))  Clostridium Difficile by PCR     Status: Abnormal   Collection Time: 01/14/14  5:40 PM  Result Value Ref Range Status   C difficile by pcr POSITIVE (A) NEGATIVE Final    Comment: CRITICAL RESULT CALLED TO, READ BACK  BY AND VERIFIED WITH: LUCAS RN 9:55 01/15/14 ([email or web address])      Studies: Ct Head Wo Contrast  01/14/2014   CLINICAL DATA:  Unresponsive.  EXAM: CT HEAD WITHOUT CONTRAST  TECHNIQUE: Contiguous axial images were obtained from the base of the skull through the vertex without intravenous contrast.  COMPARISON:  CT head 11/03/2013  FINDINGS: Generalized atrophy. Chronic microvascular ischemic changes in the white matter. Negative for acute infarct. Negative for hemorrhage or mass.  Atherosclerotic calcification.  Calvarium is intact.  IMPRESSION: Atrophy and chronic microvascular ischemia.  No acute abnormality   Electronically Signed   By: Marlan Palauharles  Clark M.D.   On: 01/14/2014 16:10   Ct Chest Wo Contrast  01/14/2014   CLINICAL  DATA:  58 year old male with hypoglycemia and chest congestion. Initial encounter.  EXAM: CT CHEST WITHOUT CONTRAST  TECHNIQUE: Multidetector CT imaging of the chest was performed following the standard protocol without IV contrast.  COMPARISON:  Portable chest radiograph 0940 hr today and earlier.  FINDINGS: Large bilateral layering pleural effusions. No pericardial effusion.  Compressive atelectasis in both lungs. Some air bronchograms in both lower lobes. Major airways are patent except for a atelectatic changes. Mild mostly depending ground-glass opacity in both upper lobes.  No acute osseous abnormality identified. Left chest cardiac event recorder.  Widespread calcified atherosclerosis in the upper abdomen. The thoracic aorta is relatively spared. No mediastinal lymphadenopathy. No axillary lymphadenopathy. Negative non contrast thoracic inlet.  Negative non contrast liver, spleen, pancreas, adrenal glands, kidneys, and bowel in the upper abdomen.  IMPRESSION: 1. Dominant abnormality is large bilateral layering pleural effusions with compressive atelectasis in both lungs. 2. No superimposed pneumonia, and no definite pulmonary edema at this time.   Electronically Signed   By: Augusto GambleLee   Hall M.D.   On: 01/14/2014 13:18   Dg Chest Portable 1 View  01/14/2014   CLINICAL DATA:  Hypoglycemia.  EXAM: PORTABLE CHEST - 1 VIEW  COMPARISON:  11/03/2013.  FINDINGS: Trachea is midline. Heart size stable. There is fairly diffuse mixed interstitial and airspace disease with a layering and possibly partially loculated right pleural effusion. Left costophrenic angle was not included on the image. Possible left lower lobe collapse/ consolidation.  IMPRESSION: 1. Diffuse mixed interstitial and airspace disease may be due to edema. Difficult to exclude pneumonia. 2. Possible left lower lobe collapse/consolidation. 3. Layering and possibly partially loculated right pleural effusion.   Electronically Signed   By: Leanna BattlesMelinda  Blietz M.D.   On: 01/14/2014 10:11    Scheduled Meds: . amLODipine  10 mg Oral Daily  . atorvastatin  20 mg Oral q1800  . calcitRIOL  0.25 mcg Oral Daily  . calcium acetate  667 mg Oral TID WC  . carvedilol  18.75 mg Oral BID WC  . enoxaparin (LOVENOX) injection  1 mg/kg Subcutaneous Q24H  . feeding supplement (NEPRO CARB STEADY)  237 mL Oral TID BM  . furosemide  80 mg Oral Daily  . insulin aspart  0-5 Units Subcutaneous TID WC  . insulin glargine  5 Units Subcutaneous QHS  . isosorbide mononitrate  60 mg Oral Daily  . sodium bicarbonate  1,300 mg Oral BID  . sodium chloride  3 mL Intravenous Q12H  . thiamine  100 mg Oral q morning - 10a  . vancomycin  125 mg Oral 4 times per day  . Warfarin - Pharmacist Dosing Inpatient   Does not apply q1800   Continuous Infusions:   Principal Problem:   Hypoglycemia Active Problems:   History of seizures   DM (diabetes mellitus), type 2 with renal complications   DVT (deep venous thrombosis)   Unresponsive episode   Hypothermia   Diarrhea   Chronic kidney disease (CKD), stage IV (severe)   Clostridium difficile diarrhea    Conley CanalYork, Marianne L, PA-C  [facility] Pager [phone]. If 7PM-7AM, please contact  night-coverage at [email or web address], password [email or web address] 01/16/2014, 10:12 AM  LOS: 2 days    Patient seen and examined. Agree with above assessment and plan done by Algis DownsMarianne York.  Needs repeat chest x ray, renal consulted.   Janaysia Mcleroy, Md./

## 2014-01-17 ENCOUNTER — Inpatient Hospital Stay (HOSPITAL_COMMUNITY): Payer: Medicare Other

## 2014-01-17 DIAGNOSIS — E1129 Type 2 diabetes mellitus with other diabetic kidney complication: Secondary | ICD-10-CM

## 2014-01-17 DIAGNOSIS — T68XXXD Hypothermia, subsequent encounter: Secondary | ICD-10-CM

## 2014-01-17 LAB — IRON AND TIBC
IRON: 55 ug/dL (ref 42–135)
Saturation Ratios: 29 % (ref 20–55)
TIBC: 189 ug/dL — ABNORMAL LOW (ref 215–435)
UIBC: 134 ug/dL (ref 125–400)

## 2014-01-17 LAB — RENAL FUNCTION PANEL
ANION GAP: 12 (ref 5–15)
Albumin: 2.2 g/dL — ABNORMAL LOW (ref 3.5–5.2)
BUN: 46 mg/dL — ABNORMAL HIGH (ref 6–23)
CALCIUM: 8.7 mg/dL (ref 8.4–10.5)
CO2: 22 mEq/L (ref 19–32)
Chloride: 105 mEq/L (ref 96–112)
Creatinine, Ser: 3.99 mg/dL — ABNORMAL HIGH (ref 0.50–1.35)
GFR, EST AFRICAN AMERICAN: 18 mL/min — AB (ref >90)
GFR, EST NON AFRICAN AMERICAN: 15 mL/min — AB (ref >90)
Glucose, Bld: 90 mg/dL (ref 70–99)
Phosphorus: 4.5 mg/dL (ref 2.3–4.6)
Potassium: 4.9 mEq/L (ref 3.7–5.3)
Sodium: 139 mEq/L (ref 137–147)

## 2014-01-17 LAB — CBC
HCT: 33.7 % — ABNORMAL LOW (ref 39.0–52.0)
Hemoglobin: 10.9 g/dL — ABNORMAL LOW (ref 13.0–17.0)
MCH: 29.1 pg (ref 26.0–34.0)
MCHC: 32.3 g/dL (ref 30.0–36.0)
MCV: 90.1 fL (ref 78.0–100.0)
PLATELETS: 197 10*3/uL (ref 150–400)
RBC: 3.74 MIL/uL — AB (ref 4.22–5.81)
RDW: 16.4 % — ABNORMAL HIGH (ref 11.5–15.5)
WBC: 4 10*3/uL (ref 4.0–10.5)

## 2014-01-17 LAB — PROTIME-INR
INR: 1.3 (ref 0.00–1.49)
Prothrombin Time: 16.3 seconds — ABNORMAL HIGH (ref 11.6–15.2)

## 2014-01-17 LAB — GLUCOSE, CAPILLARY
GLUCOSE-CAPILLARY: 128 mg/dL — AB (ref 70–99)
GLUCOSE-CAPILLARY: 118 mg/dL — AB (ref 70–99)
GLUCOSE-CAPILLARY: 157 mg/dL — AB (ref 70–99)
Glucose-Capillary: 136 mg/dL — ABNORMAL HIGH (ref 70–99)
Glucose-Capillary: 88 mg/dL (ref 70–99)

## 2014-01-17 LAB — URINE CULTURE: Colony Count: >=100000

## 2014-01-17 MED ORDER — WARFARIN SODIUM 4 MG PO TABS
4.0000 mg | ORAL_TABLET | Freq: Once | ORAL | Status: AC
  Administered 2014-01-17: 4 mg via ORAL
  Filled 2014-01-17: qty 1

## 2014-01-17 NOTE — Progress Notes (Signed)
Subjective: Interval History: has no complaint,peeing a lot.  Objective: Vital signs in last 24 hours: Temp:  [97.8 F (36.6 C)-98.4 F (36.9 C)] 98.4 F (36.9 C) (12/19 0607) Pulse Rate:  [66-70] 69 (12/19 0607) Resp:  [18] 18 (12/19 0607) BP: (139-172)/(66-85) 172/85 mmHg (12/19 0607) SpO2:  [94 %-96 %] 96 % (12/19 0607) Weight:  [54.114 kg (119 lb 4.8 oz)-55 kg (121 lb 4.1 oz)] 55 kg (121 lb 4.1 oz) (12/19 0500) Weight change:   Intake/Output from previous day: 12/18 0701 - 12/19 0700 In: 477 [P.O.:477] Out: 1150 [Urine:1150] Intake/Output this shift: Total I/O In: -  Out: 100 [Urine:100]  General appearance: alert, cooperative and no distress Resp: diminished breath sounds bilaterally and rales RLL Cardio: S1, S2 normal and systolic murmur: holosystolic 2/6, blowing at apex GI: pos bs, soft Extremities: AVG RUA pos B&T  Lab Results:  Recent Labs  01/16/14 0605 01/17/14 0419  WBC 4.3 4.0  HGB 10.5* 10.9*  HCT 31.9* 33.7*  PLT 187 197   BMET:  Recent Labs  01/16/14 1230 01/17/14 0419  NA 140 139  K 5.1 4.9  CL 105 105  CO2 22 22  GLUCOSE 131* 90  BUN 46* 46*  CREATININE 4.03* 3.99*  CALCIUM 8.8 8.7   No results for input(s): PTH in the last 72 hours. Iron Studies: No results for input(s): IRON, TIBC, TRANSFERRIN, FERRITIN in the last 72 hours.  Studies/Results: No results found.  I have reviewed the patient's current medications.  Assessment/Plan: 1 CKD 5 function stable. Diuresing.  Will cont current dose lasix 2 HTN fair control 3 anemia epo, Fe 4 HPTH 5 DM per primary 6 D on AB P cont lasix, will f/u as outpatient.      LOS: 3 days   Meleny Tregoning L 01/17/2014,10:58 AM

## 2014-01-17 NOTE — Progress Notes (Addendum)
Clinical Social Work Department CLINICAL SOCIAL WORK PLACEMENT NOTE 01/17/2014  [name]  Account Number:  [id number] Admit date:  01/14/2014  Clinical Social Worker:  Pollyann SavoyJODY DRAKE, LCSW  Date/time:  01/17/2014 03:51 PM  Clinical Social Work is seeking post-discharge placement for this patient at the following level of care:   SKILLED NURSING   (*CSW will update this form in Epic as items are completed)   01/17/2014  [facility]family provided with [facility] Work's list of facilities offering this level of care within the geographic area requested by the patient (or if unable, by the patient's family).  01/17/2014  Patient/family informed of their freedom to choose among providers that offer the needed level of care, that participate in Medicare, Medicaid or managed care program needed by the patient, have an available bed and are willing to accept the patient.  01/17/2014  Patient/family informed of [facility]' ownership interest in [facility], as well as of the fact that they are under no obligation to receive care at this facility.  PASARR submitted to EDS on 01/17/2014 PASARR number received on 01/17/2014  FL2 transmitted to all facilities in geographic area requested by pt/family on  01/17/2014 FL2 transmitted to all facilities within larger geographic area on   Patient informed that his/her managed care company has contracts with or will negotiate with  certain facilities, including the following:     Patient/family informed of bed offers received:  01/18/2014 [facility] and patient chooses bed at    [facility]  [facility]  01/18/2014 Patient to be transferred to facility by [facility]-Andras Grunewald Patrick-Jefferson, LCSWA, DP Patient and family notified of transfer on 01/18/2014 Name of family member notified: Celeste-sister   The following physician request were  entered in Epic:   Additional Comments:  Forensic psychologistCrystal Patrick-Jefferson, LCSWA Weekend Clinical Social Worker [phone]

## 2014-01-17 NOTE — Progress Notes (Signed)
PROGRESS NOTE  [name] [name] ZOX:[id number] DOB: 10-24-1955 DOA: 01/14/2014 PCP: Lonia Blood, MD  58 year old male with recurrent admissions for hypoglycemia in the setting of brittle diabetes, he has a history of chronic kidney disease, stroke, seizures, and hypertension.  He was found unresponsive by his sister, who called EMS.  On evaluation he had a CBG of 44.  In the ER he was found to be hypothermic with diarrhea.  Per the history, he had recently finished a course of antibiotics for C. difficile.   Assessment/Plan: Hypoglycemia in the setting of brittle diabetes -Lantus has been decreased, CBGs are now stable.  Hgb A1C 9.3 -Monitor on low dose lantus and SSI -will adjust hypoglycemic regimen as needed -advise not to skip meals  C. difficile diarrhea -He is C. difficile PCR positive.  It appears his recent C-diff was inadequately treated.   -Patient was having 6-8 watery bowel movements daily. Now down to 3-4 -He is slowly improving.   -continue Oral vancomycin   Pleural Effusions Imaging shows large bilateral layering pleural effusions that were not present on CXR in 10/2013.  Suspect they are due to worsening renal function.  Patient complains of fatigue. Oxygen sats are 90 - 91% on room air.    -continue increased dose of laxis as recommended by renal service  Rising potassium -Patient's K rose from 4.5 - 5.2 with no supplementation while having diarrhea.  Will monitor closely.  Place on renal diet. -lasix dose increased  HTN -On home meds. (coreg, amlodipine, imdur, lasix).   -after lasix adjusted BP better -will continue renal diet -PRN hydralazine  Anorexia/protein calorie malnutrition severe -Patient denies dysphagia/odynophagia.  -started on Nepro -will follow dietitian rec's   Hypothermia Resolved with use of bear hugger  Question of seizures Sister reports staring episodes that last 10 minutes during which he drools and has incontinence. -EEG was  obtained and is normal. CT head is negative for acute changes. -Neurology, Dr. Thad Ranger, curb sided. She advised that he seek outpatient follow-up once his CBGs have stabilized and his C. difficile has cleared if he is still having these episodes. -No staring episodes noted during this admission.   History of CVA -Residual left-sided weakness. PT recommends 24 hours supervision.  Patient is agreeable to go to SNF for rehab. -SW helping with placement  History of chronic kidney disease stage IV-V -Creatinine appears stable. Patient noted to have fistula in place.  Baseline creatinine approximately 3.7 -Monitor urine out put. Strict I and O.  -lasix increased by renal service -urine output increased. Will monitor  Recent history of right upper extremity DVT (09/2013) -On Coumadin, INR subtherapeutic.  -Warfarin and Lovenox per pharmacy.  UTI? -UA with many bacteria but negative nitrites or leukocytes. Urine culture pending with 100,000 colonies (multiple bacteria).  -Received cefepime initially, but no longer on antibiotics for UTI -Patient denies dysuria   DVT Prophylaxis:  Lovenox and warfarin  Code Status: Full code Family Communication:  Spoke with sister Celeste at patient's room Disposition Plan: Inpatient. Family requesting snf.  Patient agrees.  Consultants:  Renal  Procedures:  EEG  Antibiotics: Anti-infectives    Start     Dose/Rate Route Frequency Ordered Stop   01/14/14 1500  vancomycin (VANCOCIN) 50 mg/mL oral solution 125 mg     125 mg Oral 4 times per day 01/14/14 1417     01/14/14 1045  ceFEPIme (MAXIPIME) 1 g in dextrose 5 % 50 mL IVPB  Status:  Discontinued     1  g100 mL/hr over 30 Minutes Intravenous  Once 01/14/14 1044 01/14/14 1057        HPI/Subjective: Reports he is urinating more and denies CP, SOB, fever. Diarrhea better.  Objective: Filed Vitals:   01/17/14 0147 01/17/14 0500 01/17/14 0607 01/17/14 1526  BP: 158/71  172/85 157/66  Pulse:  67  69 71  Temp: 98.3 F (36.8 C)  98.4 F (36.9 C) 98.2 F (36.8 C)  TempSrc: Oral  Oral Oral  Resp: 18  18 18   Height:      Weight:  55 kg (121 lb 4.1 oz)    SpO2: 95%  96% 93%    Intake/Output Summary (Last 24 hours) at 01/17/14 1906 Last data filed at 01/17/14 1529  Gross per 24 hour  Intake    477 ml  Output   1450 ml  Net   -973 ml   Filed Weights   01/15/14 0500 01/16/14 1501 01/17/14 0500  Weight: 46.8 kg (103 lb 2.8 oz) 54.114 kg (119 lb 4.8 oz) 55 kg (121 lb 4.1 oz)    Exam: General: Thin, AAA, male, NAD, appears older than stated age; afebrile HEENT:  EOMI, Anicteic Sclera, MMM. No pharyngeal erythema or exudates  Neck: Supple, no JVD, no masses  Cardiovascular: RRR, S1 S2 auscultated, no rubs, murmurs or gallops.   Respiratory: Clear to auscultation bilaterally with equal chest rise  Abdomen: thin, soft, nontender, nondistended, + bowel sounds  Extremities: warm dry without cyanosis clubbing or edema.  Neuro: AAOx3, cranial nerves grossly intact. Right sided extremities notably stronger than left but 5/5 strength in each. Skin: Without rashes exudates or nodules.     Data Reviewed: Basic Metabolic Panel:  Recent Labs Lab 01/14/14 0850 01/16/14 0605 01/16/14 1230 01/17/14 0419  NA 141 140 140 139  K 4.5 5.2 5.1 4.9  CL 107 106 105 105  CO2 22 23 22 22   GLUCOSE 65* 110* 131* 90  BUN 49* 47* 46* 46*  CREATININE 3.77* 4.04* 4.03* 3.99*  CALCIUM 8.8 8.7 8.8 8.7  PHOS  --   --   --  4.5   CBC:  Recent Labs Lab 01/14/14 0850 01/15/14 0655 01/16/14 0605 01/17/14 0419  WBC 5.2 5.1 4.3 4.0  NEUTROABS 3.5  --   --   --   HGB 11.7* 11.3* 10.5* 10.9*  HCT 37.7* 35.0* 31.9* 33.7*  MCV 89.8 89.5 88.9 90.1  PLT 248 239 187 197   CBG:  Recent Labs Lab 01/16/14 1718 01/16/14 2202 01/17/14 0802 01/17/14 1214 01/17/14 1743  GLUCAP 203* 136* 88 128* 118*    Recent Results (from the past 240 hour(s))  Culture, Urine     Status: None    Collection Time: 01/14/14  8:44 AM  Result Value Ref Range Status   Specimen Description URINE, RANDOM  Final   Special Requests ADDED [id number]  Final   Culture  Setup Time   Final    01/16/2014 05:06 Performed at [facility] Count   Final    >=100,000 COLONIES/ML Performed at [facility]    Culture   Final    Multiple bacterial morphotypes present, none predominant. Suggest appropriate recollection if clinically indicated. Performed at [facility]    Report Status 01/17/2014 FINAL  Final  Clostridium Difficile by PCR     Status: Abnormal   Collection Time: 01/14/14  5:40 PM  Result Value Ref Range Status   C difficile by pcr POSITIVE (A) NEGATIVE  Final    Comment: CRITICAL RESULT CALLED TO, READ BACK BY AND VERIFIED WITH: LUCAS RN 9:55 01/15/14 ([email or web address])      Studies: Ct Head Wo Contrast  01/14/2014   CLINICAL DATA:  Unresponsive.  EXAM: CT HEAD WITHOUT CONTRAST  TECHNIQUE: Contiguous axial images were obtained from the base of the skull through the vertex without intravenous contrast.  COMPARISON:  CT head 11/03/2013  FINDINGS: Generalized atrophy. Chronic microvascular ischemic changes in the white matter. Negative for acute infarct. Negative for hemorrhage or mass.  Atherosclerotic calcification.  Calvarium is intact.  IMPRESSION: Atrophy and chronic microvascular ischemia.  No acute abnormality   Electronically Signed   By: Marlan Palauharles  Clark M.D.   On: 01/14/2014 16:10   Ct Chest Wo Contrast  01/14/2014   CLINICAL DATA:  58 year old male with hypoglycemia and chest congestion. Initial encounter.  EXAM: CT CHEST WITHOUT CONTRAST  TECHNIQUE: Multidetector CT imaging of the chest was performed following the standard protocol without IV contrast.  COMPARISON:  Portable chest radiograph 0940 hr today and earlier.  FINDINGS: Large bilateral layering pleural effusions. No pericardial effusion.  Compressive atelectasis in both lungs. Some air  bronchograms in both lower lobes. Major airways are patent except for a atelectatic changes. Mild mostly depending ground-glass opacity in both upper lobes.  No acute osseous abnormality identified. Left chest cardiac event recorder.  Widespread calcified atherosclerosis in the upper abdomen. The thoracic aorta is relatively spared. No mediastinal lymphadenopathy. No axillary lymphadenopathy. Negative non contrast thoracic inlet.  Negative non contrast liver, spleen, pancreas, adrenal glands, kidneys, and bowel in the upper abdomen.  IMPRESSION: 1. Dominant abnormality is large bilateral layering pleural effusions with compressive atelectasis in both lungs. 2. No superimposed pneumonia, and no definite pulmonary edema at this time.   Electronically Signed   By: Augusto GambleLee  Hall M.D.   On: 01/14/2014 13:18   Dg Chest Portable 1 View  01/14/2014   CLINICAL DATA:  Hypoglycemia.  EXAM: PORTABLE CHEST - 1 VIEW  COMPARISON:  11/03/2013.  FINDINGS: Trachea is midline. Heart size stable. There is fairly diffuse mixed interstitial and airspace disease with a layering and possibly partially loculated right pleural effusion. Left costophrenic angle was not included on the image. Possible left lower lobe collapse/ consolidation.  IMPRESSION: 1. Diffuse mixed interstitial and airspace disease may be due to edema. Difficult to exclude pneumonia. 2. Possible left lower lobe collapse/consolidation. 3. Layering and possibly partially loculated right pleural effusion.   Electronically Signed   By: Leanna BattlesMelinda  Blietz M.D.   On: 01/14/2014 10:11    Scheduled Meds: . amLODipine  10 mg Oral QHS  . atorvastatin  20 mg Oral q1800  . calcitRIOL  0.25 mcg Oral Daily  . calcium acetate  667 mg Oral TID WC  . carvedilol  18.75 mg Oral BID WC  . Darbepoetin Alfa  100 mcg Subcutaneous Q14 Days  . enoxaparin (LOVENOX) injection  1 mg/kg Subcutaneous Q24H  . feeding supplement (NEPRO CARB STEADY)  237 mL Oral TID BM  . furosemide  160 mg  Oral BID  . insulin aspart  0-5 Units Subcutaneous TID WC  . insulin glargine  5 Units Subcutaneous QHS  . isosorbide mononitrate  60 mg Oral Daily  . sodium bicarbonate  1,300 mg Oral BID  . sodium chloride  3 mL Intravenous Q12H  . thiamine  100 mg Oral q morning - 10a  . vancomycin  125 mg Oral 4 times per day  . Warfarin - Pharmacist Dosing Inpatient  Does not apply q1800   Continuous Infusions:   Principal Problem:   Hypoglycemia Active Problems:   History of seizures   DM (diabetes mellitus), type 2 with renal complications   DVT (deep venous thrombosis)   Unresponsive episode   Hypothermia   Diarrhea   Chronic kidney disease (CKD), stage IV (severe)   Clostridium difficile diarrhea    Vassie LollMadera, Terril Chestnut MD [phone]. If 7PM-7AM, please contact night-coverage at [email or web address], password [email or web address] 01/17/2014, 7:06 PM  LOS: 3 days

## 2014-01-17 NOTE — Progress Notes (Signed)
Clinical Social Work Department BRIEF PSYCHOSOCIAL ASSESSMENT 01/17/2014  [facility]  [name]     Account Number:  [id number]     Admit date:  01/14/2014  Clinical Social Worker:  Illene SilverRAKE,Robyne Matar, LCSW  Date/Time:  01/17/2014 03:45 PM  Referred by:  Physician  Date Referred:  01/16/2014 Referred for  SNF Placement   Other Referral:   Interview type:  Patient Other interview type:    PSYCHOSOCIAL DATA Living Status:  FAMILY Admitted from facility:   Level of care:   Primary support name:  celeste Primary support relationship to patient:  SIBLING Degree of support available:   Pt reports good family support from sister and BIL.    CURRENT CONCERNS Current Concerns  Post-Acute Placement   Other Concerns:    SOCIAL WORK ASSESSMENT / PLAN Spoke with pt re: role of CSW/dc planning.  Pt lived at home with sister and BIL and required some assistance with ADLs pta.  Pt is agreeable to SNF placement at d/c before returning home, as he is very weak.  Pt prefers [facility]. CSW to begin bed search process.   Assessment/plan status:  Psychosocial Support/Ongoing Assessment of Needs Other assessment/ plan:   Information/referral to community resources:    PATIENT'S/FAMILY'S RESPONSE TO PLAN OF CARE: Pt cheerful, but doesn't want to spend Christmas in the hospital.  Emotional support offered.

## 2014-01-17 NOTE — Progress Notes (Signed)
ANTICOAGULATION CONSULT NOTE - Follow Up Consult  Pharmacy Consult for Coumadin, lovenox Indication: RUE DVT  Allergies  Allergen Reactions  . Pork-Derived Conservation officer, natureroducts Hives and Other (See Comments)    The patient has tolerated lovenox    Patient Measurements: Height: 5\' 7"  (170.2 cm) Weight: 121 lb 4.1 oz (55 kg) IBW/kg (Calculated) : 66.1  Vital Signs: Temp: 98.4 F (36.9 C) (12/19 0607) Temp Source: Oral (12/19 0607) BP: 172/85 mmHg (12/19 0607) Pulse Rate: 69 (12/19 0607)  Labs:  Recent Labs  01/15/14 0655 01/16/14 0605 01/16/14 1230 01/17/14 0419  HGB 11.3* 10.5*  --  10.9*  HCT 35.0* 31.9*  --  33.7*  PLT 239 187  --  197  LABPROT 15.1 15.2  --  16.3*  INR 1.18 1.19  --  1.30  CREATININE  --  4.04* 4.03* 3.99*    Estimated Creatinine Clearance: 15.9 mL/min (by C-G formula based on Cr of 3.99).   Medications:  Scheduled:  . amLODipine  10 mg Oral QHS  . atorvastatin  20 mg Oral q1800  . calcitRIOL  0.25 mcg Oral Daily  . calcium acetate  667 mg Oral TID WC  . carvedilol  18.75 mg Oral BID WC  . Darbepoetin Alfa  100 mcg Subcutaneous Q14 Days  . enoxaparin (LOVENOX) injection  1 mg/kg Subcutaneous Q24H  . feeding supplement (NEPRO CARB STEADY)  237 mL Oral TID BM  . furosemide  160 mg Oral BID  . insulin aspart  0-5 Units Subcutaneous TID WC  . insulin glargine  5 Units Subcutaneous QHS  . isosorbide mononitrate  60 mg Oral Daily  . sodium bicarbonate  1,300 mg Oral BID  . sodium chloride  3 mL Intravenous Q12H  . thiamine  100 mg Oral q morning - 10a  . vancomycin  125 mg Oral 4 times per day  . Warfarin - Pharmacist Dosing Inpatient   Does not apply q1800    Assessment: 58yo male continuing pta Coumadin for RUE VTE and requiring Lovenox bridge due to sub-therapeutic INR. Pt had not been taking his Coumadin at home (PTA dose: coumadin 2mg  daily). INR still subtherapeutic but increasing, 1.19>>1.3. Hg stable 10.9, plt wnl. No bleeding documented. Pt  has CKD5, SCr stable at 3.99.  Goal of Therapy:  INR 2-3 Monitor platelets by anticoagulation protocol: Yes   Plan:  -Cont Lovenox 55mg  SQ q24h, adjusted for renal -Coumadin 4mg  x1 tonight -Vanc 125mg  po q6h  -Daily PT/INR -CBC q72h -Monitor s/sx bleeding  Babs BertinHaley Graysen Woodyard, PharmD Clinical Pharmacist - Resident Pager [phone] 01/17/2014 10:34 AM

## 2014-01-18 LAB — CBC
HCT: 32.5 % — ABNORMAL LOW (ref 39.0–52.0)
Hemoglobin: 10.3 g/dL — ABNORMAL LOW (ref 13.0–17.0)
MCH: 28.1 pg (ref 26.0–34.0)
MCHC: 31.7 g/dL (ref 30.0–36.0)
MCV: 88.8 fL (ref 78.0–100.0)
Platelets: 168 10*3/uL (ref 150–400)
RBC: 3.66 MIL/uL — ABNORMAL LOW (ref 4.22–5.81)
RDW: 16.5 % — AB (ref 11.5–15.5)
WBC: 4.3 10*3/uL (ref 4.0–10.5)

## 2014-01-18 LAB — RENAL FUNCTION PANEL
ALBUMIN: 2.2 g/dL — AB (ref 3.5–5.2)
Anion gap: 13 (ref 5–15)
BUN: 45 mg/dL — AB (ref 6–23)
CO2: 21 mEq/L (ref 19–32)
CREATININE: 4.1 mg/dL — AB (ref 0.50–1.35)
Calcium: 8.3 mg/dL — ABNORMAL LOW (ref 8.4–10.5)
Chloride: 104 mEq/L (ref 96–112)
GFR calc Af Amer: 17 mL/min — ABNORMAL LOW (ref >90)
GFR calc non Af Amer: 15 mL/min — ABNORMAL LOW (ref >90)
Glucose, Bld: 74 mg/dL (ref 70–99)
PHOSPHORUS: 4.3 mg/dL (ref 2.3–4.6)
Potassium: 4.9 mEq/L (ref 3.7–5.3)
Sodium: 138 mEq/L (ref 137–147)

## 2014-01-18 LAB — PROTIME-INR
INR: 1.38 (ref 0.00–1.49)
PROTHROMBIN TIME: 17.1 s — AB (ref 11.6–15.2)

## 2014-01-18 LAB — GLUCOSE, CAPILLARY
GLUCOSE-CAPILLARY: 127 mg/dL — AB (ref 70–99)
Glucose-Capillary: 91 mg/dL (ref 70–99)

## 2014-01-18 MED ORDER — INSULIN GLARGINE 100 UNIT/ML ~~LOC~~ SOLN: 5.0000 [IU] | Freq: Every day | SUBCUTANEOUS | Status: AC

## 2014-01-18 MED ORDER — WARFARIN SODIUM 5 MG PO TABS
5.0000 mg | ORAL_TABLET | Freq: Once | ORAL | Status: DC
  Filled 2014-01-18: qty 1

## 2014-01-18 MED ORDER — VANCOMYCIN 50 MG/ML ORAL SOLUTION: 125.0000 mg | Freq: Four times a day (QID) | ORAL | Status: DC

## 2014-01-18 MED ORDER — FUROSEMIDE 80 MG PO TABS: 160.0000 mg | ORAL_TABLET | Freq: Two times a day (BID) | ORAL | Status: AC

## 2014-01-18 MED ORDER — ENOXAPARIN SODIUM 60 MG/0.6ML ~~LOC~~ SOLN
55.0000 mg | SUBCUTANEOUS | Status: DC
Start: 2014-01-18 — End: 2014-09-02

## 2014-01-18 MED ORDER — SACCHAROMYCES BOULARDII 250 MG PO CAPS: 250.0000 mg | ORAL_CAPSULE | Freq: Two times a day (BID) | ORAL | Status: AC

## 2014-01-18 MED ORDER — WARFARIN SODIUM 5 MG PO TABS
5.0000 mg | ORAL_TABLET | Freq: Every day | ORAL | Status: DC
Start: 2014-01-18 — End: 2014-09-02

## 2014-01-18 NOTE — Progress Notes (Signed)
ANTICOAGULATION CONSULT NOTE - Follow Up Consult  Pharmacy Consult for Coumadin, lovenox Indication: RUE DVT  Allergies  Allergen Reactions  . Pork-Derived Conservation officer, natureroducts Hives and Other (See Comments)    The patient has tolerated lovenox    Patient Measurements: Height: 5\' 7"  (170.2 cm) Weight: 125 lb 1.6 oz (56.745 kg) IBW/kg (Calculated) : 66.1  Vital Signs: Temp: 98.3 F (36.8 C) (12/20 0537) Temp Source: Oral (12/20 0537) BP: 159/77 mmHg (12/20 0537) Pulse Rate: 71 (12/20 0537)  Labs:  Recent Labs  01/16/14 0605 01/16/14 1230 01/17/14 0419 01/18/14 0500  HGB 10.5*  --  10.9* 10.3*  HCT 31.9*  --  33.7* 32.5*  PLT 187  --  197 168  LABPROT 15.2  --  16.3* 17.1*  INR 1.19  --  1.30 1.38  CREATININE 4.04* 4.03* 3.99* 4.10*    Estimated Creatinine Clearance: 15.9 mL/min (by C-G formula based on Cr of 4.1).   Medications:  Scheduled:  . amLODipine  10 mg Oral QHS  . atorvastatin  20 mg Oral q1800  . calcitRIOL  0.25 mcg Oral Daily  . calcium acetate  667 mg Oral TID WC  . carvedilol  18.75 mg Oral BID WC  . Darbepoetin Alfa  100 mcg Subcutaneous Q14 Days  . enoxaparin (LOVENOX) injection  1 mg/kg Subcutaneous Q24H  . feeding supplement (NEPRO CARB STEADY)  237 mL Oral TID BM  . furosemide  160 mg Oral BID  . insulin aspart  0-5 Units Subcutaneous TID WC  . insulin glargine  5 Units Subcutaneous QHS  . isosorbide mononitrate  60 mg Oral Daily  . sodium bicarbonate  1,300 mg Oral BID  . sodium chloride  3 mL Intravenous Q12H  . thiamine  100 mg Oral q morning - 10a  . vancomycin  125 mg Oral 4 times per day  . Warfarin - Pharmacist Dosing Inpatient   Does not apply q1800    Assessment: 58yo male continuing pta Coumadin for RUE VTE and requiring Lovenox bridge due to sub-therapeutic INR. Pt had not been taking his Coumadin at home (PTA dose: coumadin 2mg  daily). INR still subtherapeutic but increasing slowly, 1.3>>1.38. Hg stable 10.3, plt stable wnl. No  bleeding documented. Pt has CKD5, SCr stable at 4.1.  Goal of Therapy:  INR 2-3 Monitor platelets by anticoagulation protocol: Yes   Plan:  -Cont Lovenox 55mg  SQ q24h until INR therapeutic x2, adjusted for renal -Coumadin 5mg  x1 tonight -Daily PT/INR -CBC q72h -Monitor s/sx bleeding  Babs BertinHaley Ajla Mcgeachy, PharmD Clinical Pharmacist - Resident Pager [phone] 01/18/2014 8:58 AM

## 2014-01-18 NOTE — Clinical Social Work Note (Signed)
CSW made aware patient ready for d/c. CSW met with patient and provided bed offers: [facility], [facility]. Patient states he is not aware of location of either facility but is agreeable to [facility]. Patient requests CSW contact sister and allow her to make decision. CSW contacted patient's sister Anderson Malta) [phone] and made her aware of bed offers. Anderson Malta states she wants patient to be close to home. Anderson Malta is agreeable to [facility] and requests patient be transported via EMS. CSW met with patient who is also agreeable to [facility] contacted [facility] and spoke with [facility]. Sharyn Lull confirmed bed availability at facility. Sharyn Lull requested d/c summary and FL2 be faxed to [phone]. CSW made RN Robyn aware. CSW currently pending d/c summary.   [place], [place] Weekend Clinical Social Worker [phone]

## 2014-01-18 NOTE — Progress Notes (Signed)
01/18/14 Physician stated leave IV site in unless impaired. Assessed site flushes good.

## 2014-01-18 NOTE — Clinical Social Work Note (Signed)
CSW faxed FL2 and d/c summary to [facility]ino DusterMichel[facility] states patient's sister has completed admission paperwork with facility. CSW prepared d/c packet and placed in patient's shadow chart. CSW provided RN Robyn with number for room: 310B and report: [id number] [phone]. CSW to arrange transportation via [facility]. No further needs. CSW signing off.   Orlyn Odonoghue Patrick-[place], LCSWA Weekend Clinical Social Worker [phone]

## 2014-01-18 NOTE — Discharge Summary (Signed)
Physician Discharge Summary  [name] [name] ZOX:[id number] DOB: 04/22/1955 DOA: 01/14/2014  PCP: Lonia Blood, MD  Admit date: 01/14/2014 Discharge date: 01/18/2014  Time spent: >30 minutes  Recommendations for Outpatient Follow-up:  Close follow up to INR and coumadin dose Patient on lovenox; needs dual therapy until INR therapeutic X 2 days Check renal panel in 5 days to follow electrolytes and renal function Repeat CXR in 3 weeks to demonstrated resolution of pleural effusion/atelectasis Follow up CBG's and adjust hypoglycemic regimen as needed  Discharge Diagnoses:  Principal Problem:   Hypoglycemia Active Problems:   History of seizures   DM (diabetes mellitus), type 2 with renal complications   DVT (deep venous thrombosis)   Unresponsive episode   Hypothermia   Diarrhea   Chronic kidney disease (CKD), stage IV (severe)   Clostridium difficile diarrhea   Discharge Condition: stable and improved. Will discharge to SNF for physical rehabilitation and conditioning   Diet recommendation: low sodium/renal diet  Filed Weights   01/16/14 1501 01/17/14 0500 01/18/14 0537  Weight: 54.114 kg (119 lb 4.8 oz) 55 kg (121 lb 4.1 oz) 56.745 kg (125 lb 1.6 oz)    History of present illness:  58 y.o. male With history of brittle diabetes and recurrent admissions for hypoglycemia, chronic kidney disease, history of stroke and seizures, hypertension found unresponsive by family members. Patient has a history of stroke and is difficult historian. Much of the history is per chart and patient's sister. When EMS arrived, blood glucose was 44. Was given oral glucose. In ED, CBG 86. Hypothermic with rectal temp 95 degrees F. Chest x-ray showed possible pneumonia versus pulmonary edema and effusion. CT chest however showed no infiltrate or pulmonary edema. Patient has had loose stool and recently completed a 10 day course of Flagyl for C. difficile colitis. Reports diarrhea resolved, then  recurred a few days ago. No abdominal pain fevers. Appetite good. No vomiting. No cough or shortness of breath. Patient's sister reports that his blood sugars are sometimes low in the mornings, in the 60s or 70s. Admitted for hypoglycemia, hypothermia, diarrhea. Now normothermic after getting bear hugger.  Also, patient's sister is concerned about possible "seizures". She describes "staring spells" where patient will not answer questions and sometimes has drooling. Unrelated to hypoglycemia.  Hospital Course:  Hypoglycemia in the setting of brittle diabetes -Lantus has been decreased, CBGs are now stable. Hgb A1C 9.3 -Monitor on lower of dose lantus and adjust hypoglycemic regimen as needed -advise not to skip meals  C. difficile diarrhea -He is C. difficile PCR positive. It appears his recent C-diff was inadequately treated.  -Patient was having 6-8 watery bowel movements daily on admisison. Now down to 1-2 -continue Oral vancomycin  -will add florastor  Pleural Effusions -Imaging shows large bilateral layering pleural effusions that were not present on CXR in 10/2013. Suspected to be secondary to worsening renal function and atelectasis.  -Oxygen sats are 90 - 91% on room air.  -continue increased dose of laxis as recommended by renal service -advise to use flutter valve  Rising potassium -potassium better regulated with lasix dose increased -at discharge K 4.9  HTN -On home meds. (coreg, amlodipine, imdur, lasix).  -after lasix adjusted BP better controlled -will continue renal diet (low sodium)  Anorexia/protein calorie malnutrition severe -Patient denies dysphagia/odynophagia.  -started on Nepro -will follow dietitian rec's   Hypothermia Resolved with use of bear hugger Most liekly due to cold exposure and hypoglycemia  Questionnable seizures Sister reports staring episodes that last 10  minutes during which he drools and has incontinence. -EEG was  obtained and is normal. CT head is negative for acute changes. -Neurology, Dr. Thad Ranger, curb sided. She advised that he seek outpatient follow-up once his CBGs have stabilized and his C. difficile has cleared if he is still having these episodes. -No staring episodes noted during this admission.   History of CVA -Residual left-sided weakness. PT recommends 24 hours supervision. Patient is agreeable to go to SNF for rehab.  History of chronic kidney disease stage IV-V -Creatinine appears stable. Patient noted to have fistula in place. Baseline creatinine approximately 3.7 -Monitor urine out put. Strict I and O.  -lasix increased by renal service to 160mg  BID -will need follow up with renal service  Recent history of right upper extremity DVT (09/2013) -On Coumadin, INR subtherapeutic.  -Warfarin and Lovenox  -needs close follow up coumadin level -once INR therapeutic X 2 days; continue just coumadin and discontinue lovenox  UTI? -UA with many bacteria but negative nitrites or leukocytes. Urine culture pending with 100,000 colonies (multiple bacteria).  -Received cefepime initially X 2 days, but no longer on antibiotics for UTI given cx's results and been asymptomatic -Patient denies dysuria  Procedures: EEG: normal  Consultations:  Neurology (curbside)  Nephrology   Discharge Exam: Filed Vitals:   01/18/14 0946  BP: 145/73  Pulse: 66  Temp: 98.5 F (36.9 C)  Resp: 20    General: Thin, AAA, male, NAD, appears older than stated age; afebrile HEENT: EOMI, Anicteic Sclera, MMM. No pharyngeal erythema or exudates  Neck: Supple, no JVD, no masses  Cardiovascular: RRR, S1 S2 auscultated, no rubs, murmurs or gallops.  Respiratory: Clear to auscultation bilaterally with equal chest rise  Abdomen: thin, soft, nontender, nondistended, + bowel sounds  Extremities: warm dry without cyanosis clubbing or edema.  Neuro: AAOx3, cranial nerves grossly intact. Right sided  extremities notably stronger than left but 5/5 strength in each. Skin: Without rashes exudates or nodules.   Discharge Instructions You were cared for by a hospitalist during your hospital stay. If you have any questions about your discharge medications or the care you received while you were in the hospital after you are discharged, you can call the unit and asked to speak with the hospitalist on call if the hospitalist that took care of you is not available. Once you are discharged, your primary care physician will handle any further medical issues. Please note that NO REFILLS for any discharge medications will be authorized once you are discharged, as it is imperative that you return to your primary care physician (or establish a relationship with a primary care physician if you do not have one) for your aftercare needs so that they can reassess your need for medications and monitor your lab values.  Discharge Instructions    Diet - low sodium heart healthy    Complete by:  As directed      Discharge instructions    Complete by:  As directed   Close follow up to INR and adjust as needed goals are (2-3) Please check renal panel in 5 days to follow electrolytes and renal function Renal diet and low carb diet Please continue oral vancomycin until 01/28/14 Continue lovenox 55mg  [place] daily until INR therapeutic X 2 days          Current Discharge Medication List    START taking these medications   Details  enoxaparin (LOVENOX) 60 MG/0.6ML injection Inject 0.55 mLs (55 mg total) into the skin daily.  To be taking daily until INR therapeutic X 2 days Qty: 0 Syringe    saccharomyces boulardii (FLORASTOR) 250 MG capsule Take 1 capsule (250 mg total) by mouth 2 (two) times daily.    vancomycin (VANCOCIN) 50 mg/mL oral solution Take 2.5 mLs (125 mg total) by mouth every 6 (six) hours.      CONTINUE these medications which have CHANGED   Details  furosemide (LASIX) 80 MG tablet Take 2 tablets  (160 mg total) by mouth 2 (two) times daily. Qty: 30 tablet, Refills: 0    insulin glargine (LANTUS) 100 UNIT/ML injection Inject 0.05 mLs (5 Units total) into the skin at bedtime. Qty: 10 mL, Refills: 11    warfarin (COUMADIN) 5 MG tablet Take 1 tablet (5 mg total) by mouth daily.      CONTINUE these medications which have NOT CHANGED   Details  amLODipine (NORVASC) 10 MG tablet Take 10 mg by mouth daily.    aspirin 81 MG tablet Take 81 mg by mouth daily.    atorvastatin (LIPITOR) 20 MG tablet Take 1 tablet (20 mg total) by mouth daily at 6 PM. Qty: 30 tablet, Refills: 0    calcitRIOL (ROCALTROL) 0.25 MCG capsule Take 1 capsule (0.25 mcg total) by mouth daily. Qty: 30 capsule, Refills: 0    calcium acetate (PHOSLO) 667 MG capsule Take 667 mg by mouth 3 (three) times daily with meals.    carvedilol (COREG) 6.25 MG tablet Take 3 tablets (18.75 mg total) by mouth 2 (two) times daily with a meal. Qty: 180 tablet, Refills: 0    glucose blood test strip Use as instructed Qty: 100 each, Refills: 12    insulin aspart (NOVOLOG) 100 UNIT/ML injection Inject 0-12 Units into the skin 3 (three) times daily with meals. SSI  BS  150-200  2 u    To be given SQ                 201-250  4 u                 251-300  6 u                 301-350  8 u                  351-400  10 u                   > 400  12 u and call MD    isosorbide mononitrate (IMDUR) 60 MG 24 hr tablet Take 1 tablet (60 mg total) by mouth daily. Qty: 30 tablet, Refills: 0    Nutritional Supplements (FEEDING SUPPLEMENT, NEPRO CARB STEADY,) LIQD Take 237 mLs by mouth 3 (three) times daily between meals. Qty: 90 Can, Refills: 0    ranitidine (ZANTAC) 150 MG tablet Take 150 mg by mouth daily.    sevelamer carbonate (RENVELA) 800 MG tablet Take 1,600 mg by mouth 3 (three) times daily with meals.    sodium bicarbonate 650 MG tablet Take 2 tablets (1,300 mg total) by mouth 2 (two) times daily. Qty: 120 tablet, Refills: 1     thiamine (VITAMIN B-1) 100 MG tablet Take 100 mg by mouth every morning.    acetaminophen (TYLENOL) 325 MG tablet Take 2 tablets (650 mg total) by mouth every 6 (six) hours as needed for mild pain (or Fever >/= 101).      STOP taking these medications     metroNIDAZOLE (FLAGYL)  500 MG tablet        Allergies  Allergen Reactions  . Pork-Derived Conservation officer, nature and Other (See Comments)    The patient has tolerated lovenox   Follow-up Information    Follow up with GARBA,LAWAL, MD. Schedule an appointment as soon as possible for a visit in 2 weeks.   Specialty:  Internal Medicine   Contact information:   [address] [place] [address] [phone]       Follow up with DETERDING,JAMES L, MD.   Specialty:  Nephrology   Why:  office to received information of appointment details   Contact information:   [address] [place] [address] [phone]       The results of significant diagnostics from this hospitalization (including imaging, microbiology, ancillary and laboratory) are listed below for reference.    Significant Diagnostic Studies: Dg Chest 2 View  01/17/2014   CLINICAL DATA:  Recent history of pleural effusion.  EXAM: CHEST  2 VIEW  COMPARISON:  Chest radiograph 01/14/2014  FINDINGS: Multiple monitoring leads overlie the patient. Patient mildly rotated. Stable cardiac and mediastinal contours. Persistent small to moderate layering right and left pleural effusions. There is underlying consolidative opacities within the right and left mid to lower lungs. No definite pneumothorax. Regional skeleton is grossly unremarkable.  IMPRESSION: Layering moderate bilateral pleural effusions with underlying consolidative opacities, suggestive of atelectasis. Infection not excluded.   Electronically Signed   By: Annia Belt M.D.   On: 01/17/2014 11:03   Ct Head Wo Contrast  01/14/2014   CLINICAL DATA:  Unresponsive.  EXAM: CT HEAD WITHOUT CONTRAST  TECHNIQUE: Contiguous  axial images were obtained from the base of the skull through the vertex without intravenous contrast.  COMPARISON:  CT head 11/03/2013  FINDINGS: Generalized atrophy. Chronic microvascular ischemic changes in the white matter. Negative for acute infarct. Negative for hemorrhage or mass.  Atherosclerotic calcification.  Calvarium is intact.  IMPRESSION: Atrophy and chronic microvascular ischemia.  No acute abnormality   Electronically Signed   By: Marlan Palau M.D.   On: 01/14/2014 16:10   Ct Chest Wo Contrast  01/14/2014   CLINICAL DATA:  58 year old male with hypoglycemia and chest congestion. Initial encounter.  EXAM: CT CHEST WITHOUT CONTRAST  TECHNIQUE: Multidetector CT imaging of the chest was performed following the standard protocol without IV contrast.  COMPARISON:  Portable chest radiograph 0940 hr today and earlier.  FINDINGS: Large bilateral layering pleural effusions. No pericardial effusion.  Compressive atelectasis in both lungs. Some air bronchograms in both lower lobes. Major airways are patent except for a atelectatic changes. Mild mostly depending ground-glass opacity in both upper lobes.  No acute osseous abnormality identified. Left chest cardiac event recorder.  Widespread calcified atherosclerosis in the upper abdomen. The thoracic aorta is relatively spared. No mediastinal lymphadenopathy. No axillary lymphadenopathy. Negative non contrast thoracic inlet.  Negative non contrast liver, spleen, pancreas, adrenal glands, kidneys, and bowel in the upper abdomen.  IMPRESSION: 1. Dominant abnormality is large bilateral layering pleural effusions with compressive atelectasis in both lungs. 2. No superimposed pneumonia, and no definite pulmonary edema at this time.   Electronically Signed   By: Augusto Gamble M.D.   On: 01/14/2014 13:18   Dg Chest Portable 1 View  01/14/2014   CLINICAL DATA:  Hypoglycemia.  EXAM: PORTABLE CHEST - 1 VIEW  COMPARISON:  11/03/2013.  FINDINGS: Trachea is midline.  Heart size stable. There is fairly diffuse mixed interstitial and airspace disease with a layering and possibly  partially loculated right pleural effusion. Left costophrenic angle was not included on the image. Possible left lower lobe collapse/ consolidation.  IMPRESSION: 1. Diffuse mixed interstitial and airspace disease may be due to edema. Difficult to exclude pneumonia. 2. Possible left lower lobe collapse/consolidation. 3. Layering and possibly partially loculated right pleural effusion.   Electronically Signed   By: Leanna BattlesMelinda  Blietz M.D.   On: 01/14/2014 10:11    Microbiology: Recent Results (from the past 240 hour(s))  Culture, Urine     Status: None   Collection Time: 01/14/14  8:44 AM  Result Value Ref Range Status   Specimen Description URINE, RANDOM  Final   Special Requests ADDED [id number]  Final   Culture  Setup Time   Final    01/16/2014 05:06 Performed at [facility]    Colony Count   Final    >=100,000 COLONIES/ML Performed at [facility]    Culture   Final    Multiple bacterial morphotypes present, none predominant. Suggest appropriate recollection if clinically indicated. Performed at [facility]    Report Status 01/17/2014 FINAL  Final  Clostridium Difficile by PCR     Status: Abnormal   Collection Time: 01/14/14  5:40 PM  Result Value Ref Range Status   C difficile by pcr POSITIVE (A) NEGATIVE Final    Comment: CRITICAL RESULT CALLED TO, READ BACK BY AND VERIFIED WITH: LUCAS RN 9:55 01/15/14 ([email or web address])      Labs: Basic Metabolic Panel:  Recent Labs Lab 01/14/14 0850 01/16/14 0605 01/16/14 1230 01/17/14 0419 01/18/14 0500  NA 141 140 140 139 138  K 4.5 5.2 5.1 4.9 4.9  CL 107 106 105 105 104  CO2 22 23 22 22 21   GLUCOSE 65* 110* 131* 90 74  BUN 49* 47* 46* 46* 45*  CREATININE 3.77* 4.04* 4.03* 3.99* 4.10*  CALCIUM 8.8 8.7 8.8 8.7 8.3*  PHOS  --   --   --  4.5 4.3   Liver Function Tests:  Recent Labs Lab  01/17/14 0419 01/18/14 0500  ALBUMIN 2.2* 2.2*   CBC:  Recent Labs Lab 01/14/14 0850 01/15/14 0655 01/16/14 0605 01/17/14 0419 01/18/14 0500  WBC 5.2 5.1 4.3 4.0 4.3  NEUTROABS 3.5  --   --   --   --   HGB 11.7* 11.3* 10.5* 10.9* 10.3*  HCT 37.7* 35.0* 31.9* 33.7* 32.5*  MCV 89.8 89.5 88.9 90.1 88.8  PLT 248 239 187 197 168   CBG:  Recent Labs Lab 01/17/14 1214 01/17/14 1743 01/17/14 2106 01/18/14 0754 01/18/14 1158  GLUCAP 128* 118* 157* 91 127*    Signed:  Vassie LollMadera, Caitland Porchia  [facility] 01/18/2014, 1:42 PM

## 2014-01-18 NOTE — Progress Notes (Signed)
01/18/14 Patient going to SNF this afternoon. IV site removed,Discharge instructions reviewed with patient. Call placed to facility to review care for patient.

## 2014-02-03 NOTE — Progress Notes (Signed)
Faxed dc summary to [facility], fax [phone]. Will contact PCP for resumption [facility] PT and OT. Isidoro DonningAlesia Ethaniel Garfield RN CCM Case Mgmt phone [phone]

## 2014-02-11 ENCOUNTER — Other Ambulatory Visit (HOSPITAL_COMMUNITY): Payer: Self-pay | Admitting: *Deleted

## 2014-02-12 ENCOUNTER — Encounter (HOSPITAL_COMMUNITY)
Admission: RE | Admit: 2014-02-12 | Discharge: 2014-02-12 | Disposition: A | Discharge status: Home / Self Care | Payer: No Typology Code available for payment source | Source: Ambulatory Visit | Attending: Nephrology | Admitting: Nephrology

## 2014-02-12 DIAGNOSIS — I120 Hypertensive chronic kidney disease with stage 5 chronic kidney disease or end stage renal disease: Secondary | ICD-10-CM | POA: Diagnosis not present

## 2014-02-12 DIAGNOSIS — E11649 Type 2 diabetes mellitus with hypoglycemia without coma: Secondary | ICD-10-CM | POA: Insufficient documentation

## 2014-02-12 DIAGNOSIS — M1990 Unspecified osteoarthritis, unspecified site: Secondary | ICD-10-CM | POA: Insufficient documentation

## 2014-02-12 DIAGNOSIS — D631 Anemia in chronic kidney disease: Secondary | ICD-10-CM | POA: Insufficient documentation

## 2014-02-12 DIAGNOSIS — Z5181 Encounter for therapeutic drug level monitoring: Secondary | ICD-10-CM | POA: Diagnosis not present

## 2014-02-12 DIAGNOSIS — Z7901 Long term (current) use of anticoagulants: Secondary | ICD-10-CM | POA: Diagnosis not present

## 2014-02-12 DIAGNOSIS — Z794 Long term (current) use of insulin: Secondary | ICD-10-CM | POA: Insufficient documentation

## 2014-02-12 DIAGNOSIS — N185 Chronic kidney disease, stage 5: Secondary | ICD-10-CM | POA: Diagnosis not present

## 2014-02-12 DIAGNOSIS — I5032 Chronic diastolic (congestive) heart failure: Secondary | ICD-10-CM | POA: Diagnosis not present

## 2014-02-12 LAB — FERRITIN: FERRITIN: 784 ng/mL — AB (ref 22–322)

## 2014-02-12 LAB — POCT HEMOGLOBIN-HEMACUE: Hemoglobin: 12.2 g/dL — ABNORMAL LOW (ref 13.0–17.0)

## 2014-02-12 LAB — IRON AND TIBC
Iron: 594 ug/dL — ABNORMAL HIGH (ref 42–165)
SATURATION RATIOS: 86 % — AB (ref 20–55)
TIBC: 688 ug/dL — ABNORMAL HIGH (ref 215–435)
UIBC: 94 ug/dL — AB (ref 125–400)

## 2014-02-12 MED ORDER — SODIUM CHLORIDE 0.9 % IV SOLN
510.0000 mg | Freq: Once | INTRAVENOUS | Status: AC
  Administered 2014-02-12: 510 mg via INTRAVENOUS
  Filled 2014-02-12: qty 17

## 2014-02-12 MED ORDER — EPOETIN ALFA 40000 UNIT/ML IJ SOLN: 30000.0000 [IU] | INTRAMUSCULAR | Status: DC

## 2014-02-26 ENCOUNTER — Encounter (HOSPITAL_COMMUNITY)
Admission: RE | Admit: 2014-02-26 | Discharge: 2014-02-26 | Disposition: A | Discharge status: Home / Self Care | Payer: No Typology Code available for payment source | Source: Ambulatory Visit | Attending: Nephrology | Admitting: Nephrology

## 2014-02-26 DIAGNOSIS — D631 Anemia in chronic kidney disease: Secondary | ICD-10-CM | POA: Diagnosis not present

## 2014-02-26 MED ORDER — EPOETIN ALFA 40000 UNIT/ML IJ SOLN: 30000.0000 [IU] | INTRAMUSCULAR | Status: DC

## 2014-02-27 LAB — POCT HEMOGLOBIN-HEMACUE: Hemoglobin: 13.8 g/dL (ref 13.0–17.0)

## 2014-02-28 ENCOUNTER — Other Ambulatory Visit: Payer: Self-pay | Admitting: Internal Medicine

## 2014-03-12 ENCOUNTER — Encounter (HOSPITAL_COMMUNITY)
Admission: RE | Admit: 2014-03-12 | Discharge: 2014-03-12 | Disposition: A | Discharge status: Home / Self Care | Payer: Medicare Other | Source: Ambulatory Visit | Attending: Nephrology | Admitting: Nephrology

## 2014-03-12 DIAGNOSIS — E11649 Type 2 diabetes mellitus with hypoglycemia without coma: Secondary | ICD-10-CM | POA: Diagnosis not present

## 2014-03-12 DIAGNOSIS — Z794 Long term (current) use of insulin: Secondary | ICD-10-CM | POA: Insufficient documentation

## 2014-03-12 DIAGNOSIS — I120 Hypertensive chronic kidney disease with stage 5 chronic kidney disease or end stage renal disease: Secondary | ICD-10-CM | POA: Diagnosis not present

## 2014-03-12 DIAGNOSIS — N185 Chronic kidney disease, stage 5: Secondary | ICD-10-CM | POA: Insufficient documentation

## 2014-03-12 DIAGNOSIS — I5032 Chronic diastolic (congestive) heart failure: Secondary | ICD-10-CM | POA: Insufficient documentation

## 2014-03-12 DIAGNOSIS — Z7901 Long term (current) use of anticoagulants: Secondary | ICD-10-CM | POA: Insufficient documentation

## 2014-03-12 DIAGNOSIS — Z5181 Encounter for therapeutic drug level monitoring: Secondary | ICD-10-CM | POA: Insufficient documentation

## 2014-03-12 DIAGNOSIS — M1990 Unspecified osteoarthritis, unspecified site: Secondary | ICD-10-CM | POA: Insufficient documentation

## 2014-03-12 DIAGNOSIS — D631 Anemia in chronic kidney disease: Secondary | ICD-10-CM | POA: Insufficient documentation

## 2014-03-12 LAB — IRON AND TIBC
Iron: 73 ug/dL (ref 42–165)
SATURATION RATIOS: 35 % (ref 20–55)
TIBC: 206 ug/dL — ABNORMAL LOW (ref 215–435)
UIBC: 133 ug/dL (ref 125–400)

## 2014-03-12 LAB — FERRITIN: Ferritin: 1061 ng/mL — ABNORMAL HIGH (ref 22–322)

## 2014-03-12 LAB — POCT HEMOGLOBIN-HEMACUE: Hemoglobin: 11.8 g/dL — ABNORMAL LOW (ref 13.0–17.0)

## 2014-03-12 MED ORDER — EPOETIN ALFA 40000 UNIT/ML IJ SOLN: 30000.0000 [IU] | INTRAMUSCULAR | Status: DC

## 2014-03-12 MED ORDER — EPOETIN ALFA 10000 UNIT/ML IJ SOLN
INTRAMUSCULAR | Status: AC
  Administered 2014-03-12: 10000 [IU] via SUBCUTANEOUS
  Filled 2014-03-12: qty 1

## 2014-03-12 MED ORDER — EPOETIN ALFA 20000 UNIT/ML IJ SOLN
INTRAMUSCULAR | Status: AC
  Administered 2014-03-12: 20000 [IU] via SUBCUTANEOUS
  Filled 2014-03-12: qty 1

## 2014-03-26 ENCOUNTER — Encounter (HOSPITAL_COMMUNITY)
Admission: RE | Admit: 2014-03-26 | Discharge: 2014-03-26 | Disposition: A | Discharge status: Home / Self Care | Payer: Medicare Other | Source: Ambulatory Visit | Attending: Nephrology | Admitting: Nephrology

## 2014-03-26 DIAGNOSIS — D631 Anemia in chronic kidney disease: Secondary | ICD-10-CM | POA: Diagnosis not present

## 2014-03-26 LAB — POCT HEMOGLOBIN-HEMACUE: Hemoglobin: 11.9 g/dL — ABNORMAL LOW (ref 13.0–17.0)

## 2014-03-26 MED ORDER — EPOETIN ALFA 40000 UNIT/ML IJ SOLN: 30000.0000 [IU] | INTRAMUSCULAR | Status: DC

## 2014-03-26 MED ORDER — EPOETIN ALFA 20000 UNIT/ML IJ SOLN
INTRAMUSCULAR | Status: AC
  Administered 2014-03-26: 20000 [IU]
  Filled 2014-03-26: qty 1

## 2014-03-26 MED ORDER — EPOETIN ALFA 10000 UNIT/ML IJ SOLN
INTRAMUSCULAR | Status: AC
  Administered 2014-03-26: 10000 [IU]
  Filled 2014-03-26: qty 1

## 2014-03-30 ENCOUNTER — Other Ambulatory Visit: Payer: Self-pay | Admitting: Internal Medicine

## 2014-04-09 ENCOUNTER — Encounter (HOSPITAL_COMMUNITY): Payer: Medicare Other

## 2014-04-15 ENCOUNTER — Encounter (HOSPITAL_COMMUNITY): Payer: Medicare Other

## 2014-04-24 ENCOUNTER — Encounter (HOSPITAL_COMMUNITY)
Admission: RE | Admit: 2014-04-24 | Discharge: 2014-04-24 | Disposition: A | Discharge status: Home / Self Care | Payer: Medicare Other | Source: Ambulatory Visit | Attending: Nephrology | Admitting: Nephrology

## 2014-04-24 DIAGNOSIS — N185 Chronic kidney disease, stage 5: Secondary | ICD-10-CM | POA: Insufficient documentation

## 2014-04-24 DIAGNOSIS — M1990 Unspecified osteoarthritis, unspecified site: Secondary | ICD-10-CM | POA: Insufficient documentation

## 2014-04-24 DIAGNOSIS — E11649 Type 2 diabetes mellitus with hypoglycemia without coma: Secondary | ICD-10-CM | POA: Insufficient documentation

## 2014-04-24 DIAGNOSIS — Z794 Long term (current) use of insulin: Secondary | ICD-10-CM | POA: Diagnosis not present

## 2014-04-24 DIAGNOSIS — I120 Hypertensive chronic kidney disease with stage 5 chronic kidney disease or end stage renal disease: Secondary | ICD-10-CM | POA: Insufficient documentation

## 2014-04-24 DIAGNOSIS — Z7901 Long term (current) use of anticoagulants: Secondary | ICD-10-CM | POA: Diagnosis not present

## 2014-04-24 DIAGNOSIS — D631 Anemia in chronic kidney disease: Secondary | ICD-10-CM | POA: Insufficient documentation

## 2014-04-24 DIAGNOSIS — Z5181 Encounter for therapeutic drug level monitoring: Secondary | ICD-10-CM | POA: Insufficient documentation

## 2014-04-24 DIAGNOSIS — I5032 Chronic diastolic (congestive) heart failure: Secondary | ICD-10-CM | POA: Diagnosis not present

## 2014-04-24 LAB — POCT HEMOGLOBIN-HEMACUE: Hemoglobin: 11.1 g/dL — ABNORMAL LOW (ref 13.0–17.0)

## 2014-04-24 LAB — IRON AND TIBC
IRON: 74 ug/dL (ref 42–165)
Saturation Ratios: 29 % (ref 20–55)
TIBC: 253 ug/dL (ref 215–435)
UIBC: 179 ug/dL (ref 125–400)

## 2014-04-24 LAB — FERRITIN: Ferritin: 1231 ng/mL — ABNORMAL HIGH (ref 22–322)

## 2014-04-24 MED ORDER — EPOETIN ALFA 40000 UNIT/ML IJ SOLN: 30000.0000 [IU] | INTRAMUSCULAR | Status: DC

## 2014-04-24 MED ORDER — EPOETIN ALFA 10000 UNIT/ML IJ SOLN
INTRAMUSCULAR | Status: AC
  Administered 2014-04-24: 10000 [IU] via SUBCUTANEOUS
  Filled 2014-04-24: qty 1

## 2014-04-24 MED ORDER — EPOETIN ALFA 20000 UNIT/ML IJ SOLN
INTRAMUSCULAR | Status: AC
  Administered 2014-04-24: 20000 [IU] via SUBCUTANEOUS
  Filled 2014-04-24: qty 1

## 2014-05-04 ENCOUNTER — Ambulatory Visit (INDEPENDENT_AMBULATORY_CARE_PROVIDER_SITE_OTHER): Payer: Medicare Other | Admitting: Ophthalmology

## 2014-05-08 ENCOUNTER — Encounter (HOSPITAL_COMMUNITY): Payer: Medicare Other

## 2014-05-18 ENCOUNTER — Encounter (HOSPITAL_COMMUNITY)
Admission: RE | Admit: 2014-05-18 | Discharge: 2014-05-18 | Disposition: A | Discharge status: Home / Self Care | Payer: Medicare Other | Source: Ambulatory Visit | Attending: Nephrology | Admitting: Nephrology

## 2014-05-18 DIAGNOSIS — I120 Hypertensive chronic kidney disease with stage 5 chronic kidney disease or end stage renal disease: Secondary | ICD-10-CM | POA: Diagnosis not present

## 2014-05-18 DIAGNOSIS — E11649 Type 2 diabetes mellitus with hypoglycemia without coma: Secondary | ICD-10-CM | POA: Diagnosis not present

## 2014-05-18 DIAGNOSIS — Z7901 Long term (current) use of anticoagulants: Secondary | ICD-10-CM | POA: Insufficient documentation

## 2014-05-18 DIAGNOSIS — Z5181 Encounter for therapeutic drug level monitoring: Secondary | ICD-10-CM | POA: Insufficient documentation

## 2014-05-18 DIAGNOSIS — D631 Anemia in chronic kidney disease: Secondary | ICD-10-CM | POA: Insufficient documentation

## 2014-05-18 DIAGNOSIS — M1990 Unspecified osteoarthritis, unspecified site: Secondary | ICD-10-CM | POA: Diagnosis not present

## 2014-05-18 DIAGNOSIS — N185 Chronic kidney disease, stage 5: Secondary | ICD-10-CM | POA: Diagnosis not present

## 2014-05-18 DIAGNOSIS — Z794 Long term (current) use of insulin: Secondary | ICD-10-CM | POA: Insufficient documentation

## 2014-05-18 DIAGNOSIS — I5032 Chronic diastolic (congestive) heart failure: Secondary | ICD-10-CM | POA: Insufficient documentation

## 2014-05-18 LAB — IRON AND TIBC
Iron: 78 ug/dL (ref 42–165)
Saturation Ratios: 34 % (ref 20–55)
TIBC: 232 ug/dL (ref 215–435)
UIBC: 154 ug/dL (ref 125–400)

## 2014-05-18 LAB — FERRITIN: Ferritin: 811 ng/mL — ABNORMAL HIGH (ref 22–322)

## 2014-05-18 LAB — POCT HEMOGLOBIN-HEMACUE: Hemoglobin: 11.2 g/dL — ABNORMAL LOW (ref 13.0–17.0)

## 2014-05-18 MED ORDER — EPOETIN ALFA 20000 UNIT/ML IJ SOLN
INTRAMUSCULAR | Status: AC
  Administered 2014-05-18: 20000 [IU]
  Filled 2014-05-18: qty 1

## 2014-05-18 MED ORDER — EPOETIN ALFA 10000 UNIT/ML IJ SOLN
INTRAMUSCULAR | Status: AC
  Administered 2014-05-18: 10000 [IU]
  Filled 2014-05-18: qty 1

## 2014-05-18 MED ORDER — EPOETIN ALFA 40000 UNIT/ML IJ SOLN: 30000.0000 [IU] | INTRAMUSCULAR | Status: DC

## 2014-05-19 ENCOUNTER — Ambulatory Visit: Payer: Self-pay | Admitting: Cardiology

## 2014-05-29 ENCOUNTER — Ambulatory Visit: Payer: Self-pay | Admitting: Cardiology

## 2014-06-01 ENCOUNTER — Encounter (HOSPITAL_COMMUNITY)
Admission: RE | Admit: 2014-06-01 | Discharge: 2014-06-01 | Disposition: A | Discharge status: Home / Self Care | Payer: Medicare Other | Source: Ambulatory Visit | Attending: Nephrology | Admitting: Nephrology

## 2014-06-01 ENCOUNTER — Ambulatory Visit (INDEPENDENT_AMBULATORY_CARE_PROVIDER_SITE_OTHER): Payer: Medicare Other | Admitting: Ophthalmology

## 2014-06-01 DIAGNOSIS — E11319 Type 2 diabetes mellitus with unspecified diabetic retinopathy without macular edema: Secondary | ICD-10-CM

## 2014-06-01 DIAGNOSIS — H2511 Age-related nuclear cataract, right eye: Secondary | ICD-10-CM

## 2014-06-01 DIAGNOSIS — Z5181 Encounter for therapeutic drug level monitoring: Secondary | ICD-10-CM | POA: Insufficient documentation

## 2014-06-01 DIAGNOSIS — Z7901 Long term (current) use of anticoagulants: Secondary | ICD-10-CM | POA: Diagnosis not present

## 2014-06-01 DIAGNOSIS — Z794 Long term (current) use of insulin: Secondary | ICD-10-CM | POA: Diagnosis not present

## 2014-06-01 DIAGNOSIS — I120 Hypertensive chronic kidney disease with stage 5 chronic kidney disease or end stage renal disease: Secondary | ICD-10-CM | POA: Diagnosis not present

## 2014-06-01 DIAGNOSIS — E11359 Type 2 diabetes mellitus with proliferative diabetic retinopathy without macular edema: Secondary | ICD-10-CM

## 2014-06-01 DIAGNOSIS — I5032 Chronic diastolic (congestive) heart failure: Secondary | ICD-10-CM | POA: Insufficient documentation

## 2014-06-01 DIAGNOSIS — M1990 Unspecified osteoarthritis, unspecified site: Secondary | ICD-10-CM | POA: Diagnosis not present

## 2014-06-01 DIAGNOSIS — I10 Essential (primary) hypertension: Secondary | ICD-10-CM

## 2014-06-01 DIAGNOSIS — E11649 Type 2 diabetes mellitus with hypoglycemia without coma: Secondary | ICD-10-CM | POA: Insufficient documentation

## 2014-06-01 DIAGNOSIS — D631 Anemia in chronic kidney disease: Secondary | ICD-10-CM | POA: Insufficient documentation

## 2014-06-01 DIAGNOSIS — H35033 Hypertensive retinopathy, bilateral: Secondary | ICD-10-CM

## 2014-06-01 DIAGNOSIS — N185 Chronic kidney disease, stage 5: Secondary | ICD-10-CM | POA: Insufficient documentation

## 2014-06-01 DIAGNOSIS — H43813 Vitreous degeneration, bilateral: Secondary | ICD-10-CM | POA: Diagnosis not present

## 2014-06-01 LAB — POCT HEMOGLOBIN-HEMACUE: Hemoglobin: 11.5 g/dL — ABNORMAL LOW (ref 13.0–17.0)

## 2014-06-01 MED ORDER — EPOETIN ALFA 40000 UNIT/ML IJ SOLN: 30000.0000 [IU] | INTRAMUSCULAR | Status: DC

## 2014-06-01 MED ORDER — EPOETIN ALFA 10000 UNIT/ML IJ SOLN
INTRAMUSCULAR | Status: AC
  Administered 2014-06-01: 10000 [IU]
  Filled 2014-06-01: qty 1

## 2014-06-01 MED ORDER — EPOETIN ALFA 20000 UNIT/ML IJ SOLN
INTRAMUSCULAR | Status: AC
  Administered 2014-06-01: 20000 [IU]
  Filled 2014-06-01: qty 1

## 2014-06-15 ENCOUNTER — Inpatient Hospital Stay (HOSPITAL_COMMUNITY): Admission: RE | Admit: 2014-06-15 | Payer: Medicare Other | Source: Ambulatory Visit

## 2014-06-24 ENCOUNTER — Encounter (HOSPITAL_COMMUNITY): Payer: Medicare Other

## 2014-07-10 ENCOUNTER — Encounter (HOSPITAL_COMMUNITY)
Admission: RE | Admit: 2014-07-10 | Discharge: 2014-07-10 | Disposition: A | Discharge status: Home / Self Care | Payer: Medicare Other | Source: Ambulatory Visit | Attending: Nephrology | Admitting: Nephrology

## 2014-07-10 DIAGNOSIS — M1990 Unspecified osteoarthritis, unspecified site: Secondary | ICD-10-CM | POA: Diagnosis not present

## 2014-07-10 DIAGNOSIS — D631 Anemia in chronic kidney disease: Secondary | ICD-10-CM | POA: Insufficient documentation

## 2014-07-10 DIAGNOSIS — Z794 Long term (current) use of insulin: Secondary | ICD-10-CM | POA: Insufficient documentation

## 2014-07-10 DIAGNOSIS — N185 Chronic kidney disease, stage 5: Secondary | ICD-10-CM | POA: Diagnosis not present

## 2014-07-10 DIAGNOSIS — I5032 Chronic diastolic (congestive) heart failure: Secondary | ICD-10-CM | POA: Diagnosis not present

## 2014-07-10 DIAGNOSIS — E11649 Type 2 diabetes mellitus with hypoglycemia without coma: Secondary | ICD-10-CM | POA: Insufficient documentation

## 2014-07-10 DIAGNOSIS — Z5181 Encounter for therapeutic drug level monitoring: Secondary | ICD-10-CM | POA: Diagnosis not present

## 2014-07-10 DIAGNOSIS — Z7901 Long term (current) use of anticoagulants: Secondary | ICD-10-CM | POA: Diagnosis not present

## 2014-07-10 DIAGNOSIS — I120 Hypertensive chronic kidney disease with stage 5 chronic kidney disease or end stage renal disease: Secondary | ICD-10-CM | POA: Diagnosis not present

## 2014-07-10 LAB — POCT HEMOGLOBIN-HEMACUE: Hemoglobin: 11.2 g/dL — ABNORMAL LOW (ref 13.0–17.0)

## 2014-07-10 LAB — IRON AND TIBC
IRON: 58 ug/dL (ref 45–182)
Saturation Ratios: 24 % (ref 17.9–39.5)
TIBC: 246 ug/dL — ABNORMAL LOW (ref 250–450)
UIBC: 188 ug/dL

## 2014-07-10 LAB — FERRITIN: Ferritin: 627 ng/mL — ABNORMAL HIGH (ref 24–336)

## 2014-07-10 MED ORDER — EPOETIN ALFA 10000 UNIT/ML IJ SOLN
INTRAMUSCULAR | Status: AC
  Administered 2014-07-10: 10000 [IU] via SUBCUTANEOUS
  Filled 2014-07-10: qty 1

## 2014-07-10 MED ORDER — EPOETIN ALFA 40000 UNIT/ML IJ SOLN: 30000.0000 [IU] | INTRAMUSCULAR | Status: DC

## 2014-07-10 MED ORDER — EPOETIN ALFA 20000 UNIT/ML IJ SOLN
INTRAMUSCULAR | Status: AC
  Administered 2014-07-10: 20000 [IU] via SUBCUTANEOUS
  Filled 2014-07-10: qty 1

## 2014-07-24 ENCOUNTER — Encounter (HOSPITAL_COMMUNITY): Payer: Medicare Other

## 2014-07-24 ENCOUNTER — Encounter (HOSPITAL_COMMUNITY)
Admission: RE | Admit: 2014-07-24 | Discharge: 2014-07-24 | Disposition: A | Discharge status: Home / Self Care | Payer: Medicare Other | Source: Ambulatory Visit | Attending: Nephrology | Admitting: Nephrology

## 2014-07-24 DIAGNOSIS — Z5181 Encounter for therapeutic drug level monitoring: Secondary | ICD-10-CM | POA: Diagnosis not present

## 2014-07-24 DIAGNOSIS — Z79899 Other long term (current) drug therapy: Secondary | ICD-10-CM | POA: Insufficient documentation

## 2014-07-24 DIAGNOSIS — D509 Iron deficiency anemia, unspecified: Secondary | ICD-10-CM | POA: Diagnosis not present

## 2014-07-24 DIAGNOSIS — N185 Chronic kidney disease, stage 5: Secondary | ICD-10-CM | POA: Diagnosis not present

## 2014-07-24 DIAGNOSIS — D631 Anemia in chronic kidney disease: Secondary | ICD-10-CM | POA: Insufficient documentation

## 2014-07-24 LAB — POCT HEMOGLOBIN-HEMACUE: Hemoglobin: 10.1 g/dL — ABNORMAL LOW (ref 13.0–17.0)

## 2014-07-24 MED ORDER — EPOETIN ALFA 20000 UNIT/ML IJ SOLN
INTRAMUSCULAR | Status: AC
  Administered 2014-07-24: 20000 [IU] via SUBCUTANEOUS
  Filled 2014-07-24: qty 1

## 2014-07-24 MED ORDER — SODIUM CHLORIDE 0.9 % IV SOLN
510.0000 mg | Freq: Once | INTRAVENOUS | Status: AC
  Administered 2014-07-24: 510 mg via INTRAVENOUS
  Filled 2014-07-24: qty 17

## 2014-07-24 MED ORDER — EPOETIN ALFA 10000 UNIT/ML IJ SOLN
INTRAMUSCULAR | Status: AC
  Administered 2014-07-24: 10000 [IU] via SUBCUTANEOUS
  Filled 2014-07-24: qty 1

## 2014-08-07 ENCOUNTER — Encounter (HOSPITAL_COMMUNITY)
Admission: RE | Admit: 2014-08-07 | Discharge: 2014-08-07 | Disposition: A | Discharge status: Home / Self Care | Payer: Medicare Other | Source: Ambulatory Visit | Attending: Nephrology | Admitting: Nephrology

## 2014-08-07 DIAGNOSIS — I5032 Chronic diastolic (congestive) heart failure: Secondary | ICD-10-CM | POA: Diagnosis not present

## 2014-08-07 DIAGNOSIS — I120 Hypertensive chronic kidney disease with stage 5 chronic kidney disease or end stage renal disease: Secondary | ICD-10-CM | POA: Insufficient documentation

## 2014-08-07 DIAGNOSIS — M1990 Unspecified osteoarthritis, unspecified site: Secondary | ICD-10-CM | POA: Insufficient documentation

## 2014-08-07 DIAGNOSIS — D631 Anemia in chronic kidney disease: Secondary | ICD-10-CM | POA: Diagnosis present

## 2014-08-07 DIAGNOSIS — E11649 Type 2 diabetes mellitus with hypoglycemia without coma: Secondary | ICD-10-CM | POA: Insufficient documentation

## 2014-08-07 DIAGNOSIS — N185 Chronic kidney disease, stage 5: Secondary | ICD-10-CM | POA: Diagnosis not present

## 2014-08-07 DIAGNOSIS — Z7901 Long term (current) use of anticoagulants: Secondary | ICD-10-CM | POA: Insufficient documentation

## 2014-08-07 DIAGNOSIS — Z794 Long term (current) use of insulin: Secondary | ICD-10-CM | POA: Insufficient documentation

## 2014-08-07 DIAGNOSIS — Z5181 Encounter for therapeutic drug level monitoring: Secondary | ICD-10-CM | POA: Diagnosis not present

## 2014-08-07 LAB — IRON AND TIBC
IRON: 80 ug/dL (ref 45–182)
Saturation Ratios: 33 % (ref 17.9–39.5)
TIBC: 244 ug/dL — AB (ref 250–450)
UIBC: 164 ug/dL

## 2014-08-07 LAB — POCT HEMOGLOBIN-HEMACUE: Hemoglobin: 11.9 g/dL — ABNORMAL LOW (ref 13.0–17.0)

## 2014-08-07 LAB — FERRITIN: Ferritin: 642 ng/mL — ABNORMAL HIGH (ref 24–336)

## 2014-08-07 MED ORDER — EPOETIN ALFA 10000 UNIT/ML IJ SOLN
INTRAMUSCULAR | Status: AC
  Administered 2014-08-07: 10000 [IU]
  Filled 2014-08-07: qty 1

## 2014-08-07 MED ORDER — EPOETIN ALFA 40000 UNIT/ML IJ SOLN: 30000.0000 [IU] | INTRAMUSCULAR | Status: DC

## 2014-08-07 MED ORDER — EPOETIN ALFA 20000 UNIT/ML IJ SOLN
INTRAMUSCULAR | Status: AC
  Administered 2014-08-07: 20000 [IU]
  Filled 2014-08-07: qty 1

## 2014-08-21 ENCOUNTER — Encounter (HOSPITAL_COMMUNITY)
Admission: RE | Admit: 2014-08-21 | Discharge: 2014-08-21 | Disposition: A | Discharge status: Home / Self Care | Payer: Medicare Other | Source: Ambulatory Visit | Attending: Nephrology | Admitting: Nephrology

## 2014-08-21 DIAGNOSIS — Z7901 Long term (current) use of anticoagulants: Secondary | ICD-10-CM | POA: Diagnosis not present

## 2014-08-21 DIAGNOSIS — Z794 Long term (current) use of insulin: Secondary | ICD-10-CM | POA: Diagnosis not present

## 2014-08-21 DIAGNOSIS — E11649 Type 2 diabetes mellitus with hypoglycemia without coma: Secondary | ICD-10-CM | POA: Diagnosis not present

## 2014-08-21 DIAGNOSIS — I5032 Chronic diastolic (congestive) heart failure: Secondary | ICD-10-CM | POA: Diagnosis not present

## 2014-08-21 DIAGNOSIS — N185 Chronic kidney disease, stage 5: Secondary | ICD-10-CM | POA: Diagnosis not present

## 2014-08-21 DIAGNOSIS — I120 Hypertensive chronic kidney disease with stage 5 chronic kidney disease or end stage renal disease: Secondary | ICD-10-CM | POA: Diagnosis not present

## 2014-08-21 DIAGNOSIS — M1990 Unspecified osteoarthritis, unspecified site: Secondary | ICD-10-CM | POA: Diagnosis not present

## 2014-08-21 DIAGNOSIS — Z5181 Encounter for therapeutic drug level monitoring: Secondary | ICD-10-CM | POA: Diagnosis not present

## 2014-08-21 DIAGNOSIS — D631 Anemia in chronic kidney disease: Secondary | ICD-10-CM | POA: Diagnosis present

## 2014-08-21 LAB — POCT HEMOGLOBIN-HEMACUE: HEMOGLOBIN: 12.3 g/dL — AB (ref 13.0–17.0)

## 2014-08-21 MED ORDER — EPOETIN ALFA 40000 UNIT/ML IJ SOLN: 30000.0000 [IU] | INTRAMUSCULAR | Status: DC

## 2014-08-27 ENCOUNTER — Emergency Department (HOSPITAL_COMMUNITY): Payer: Medicare Other

## 2014-08-27 ENCOUNTER — Inpatient Hospital Stay (HOSPITAL_COMMUNITY)
Admission: EM | Admit: 2014-08-27 | Discharge: 2014-09-02 | DRG: 871 | Disposition: A | Discharge status: Other Acute Inpatient Hospital | Payer: Medicare Other | Attending: Internal Medicine | Admitting: Internal Medicine

## 2014-08-27 ENCOUNTER — Encounter (HOSPITAL_COMMUNITY): Payer: Self-pay | Admitting: Neurology

## 2014-08-27 DIAGNOSIS — R509 Fever, unspecified: Secondary | ICD-10-CM | POA: Diagnosis present

## 2014-08-27 DIAGNOSIS — D631 Anemia in chronic kidney disease: Secondary | ICD-10-CM | POA: Diagnosis present

## 2014-08-27 DIAGNOSIS — Z8673 Personal history of transient ischemic attack (TIA), and cerebral infarction without residual deficits: Secondary | ICD-10-CM

## 2014-08-27 DIAGNOSIS — N185 Chronic kidney disease, stage 5: Secondary | ICD-10-CM

## 2014-08-27 DIAGNOSIS — N186 End stage renal disease: Secondary | ICD-10-CM | POA: Diagnosis present

## 2014-08-27 DIAGNOSIS — F17210 Nicotine dependence, cigarettes, uncomplicated: Secondary | ICD-10-CM | POA: Diagnosis present

## 2014-08-27 DIAGNOSIS — E785 Hyperlipidemia, unspecified: Secondary | ICD-10-CM | POA: Diagnosis not present

## 2014-08-27 DIAGNOSIS — E11621 Type 2 diabetes mellitus with foot ulcer: Secondary | ICD-10-CM | POA: Diagnosis present

## 2014-08-27 DIAGNOSIS — D696 Thrombocytopenia, unspecified: Secondary | ICD-10-CM | POA: Diagnosis not present

## 2014-08-27 DIAGNOSIS — L97521 Non-pressure chronic ulcer of other part of left foot limited to breakdown of skin: Secondary | ICD-10-CM | POA: Diagnosis not present

## 2014-08-27 DIAGNOSIS — I69354 Hemiplegia and hemiparesis following cerebral infarction affecting left non-dominant side: Secondary | ICD-10-CM | POA: Diagnosis not present

## 2014-08-27 DIAGNOSIS — E1129 Type 2 diabetes mellitus with other diabetic kidney complication: Secondary | ICD-10-CM | POA: Diagnosis not present

## 2014-08-27 DIAGNOSIS — E1122 Type 2 diabetes mellitus with diabetic chronic kidney disease: Secondary | ICD-10-CM | POA: Diagnosis present

## 2014-08-27 DIAGNOSIS — L8990 Pressure ulcer of unspecified site, unspecified stage: Secondary | ICD-10-CM | POA: Insufficient documentation

## 2014-08-27 DIAGNOSIS — L97519 Non-pressure chronic ulcer of other part of right foot with unspecified severity: Secondary | ICD-10-CM | POA: Diagnosis not present

## 2014-08-27 DIAGNOSIS — Z8611 Personal history of tuberculosis: Secondary | ICD-10-CM | POA: Diagnosis not present

## 2014-08-27 DIAGNOSIS — Z794 Long term (current) use of insulin: Secondary | ICD-10-CM

## 2014-08-27 DIAGNOSIS — Z833 Family history of diabetes mellitus: Secondary | ICD-10-CM

## 2014-08-27 DIAGNOSIS — A047 Enterocolitis due to Clostridium difficile: Secondary | ICD-10-CM | POA: Diagnosis present

## 2014-08-27 DIAGNOSIS — Z992 Dependence on renal dialysis: Secondary | ICD-10-CM

## 2014-08-27 DIAGNOSIS — IMO0002 Reserved for concepts with insufficient information to code with codable children: Secondary | ICD-10-CM

## 2014-08-27 DIAGNOSIS — D649 Anemia, unspecified: Secondary | ICD-10-CM | POA: Diagnosis present

## 2014-08-27 DIAGNOSIS — Z8249 Family history of ischemic heart disease and other diseases of the circulatory system: Secondary | ICD-10-CM | POA: Diagnosis not present

## 2014-08-27 DIAGNOSIS — Z7982 Long term (current) use of aspirin: Secondary | ICD-10-CM | POA: Diagnosis not present

## 2014-08-27 DIAGNOSIS — Z7901 Long term (current) use of anticoagulants: Secondary | ICD-10-CM | POA: Diagnosis not present

## 2014-08-27 DIAGNOSIS — A0472 Enterocolitis due to Clostridium difficile, not specified as recurrent: Secondary | ICD-10-CM | POA: Diagnosis present

## 2014-08-27 DIAGNOSIS — E872 Acidosis: Secondary | ICD-10-CM | POA: Diagnosis not present

## 2014-08-27 DIAGNOSIS — E43 Unspecified severe protein-calorie malnutrition: Secondary | ICD-10-CM | POA: Diagnosis present

## 2014-08-27 DIAGNOSIS — B1920 Unspecified viral hepatitis C without hepatic coma: Secondary | ICD-10-CM | POA: Diagnosis present

## 2014-08-27 DIAGNOSIS — N189 Chronic kidney disease, unspecified: Secondary | ICD-10-CM

## 2014-08-27 DIAGNOSIS — A414 Sepsis due to anaerobes: Principal | ICD-10-CM | POA: Diagnosis present

## 2014-08-27 DIAGNOSIS — D638 Anemia in other chronic diseases classified elsewhere: Secondary | ICD-10-CM | POA: Diagnosis present

## 2014-08-27 DIAGNOSIS — I509 Heart failure, unspecified: Secondary | ICD-10-CM | POA: Diagnosis present

## 2014-08-27 DIAGNOSIS — I120 Hypertensive chronic kidney disease with stage 5 chronic kidney disease or end stage renal disease: Secondary | ICD-10-CM | POA: Diagnosis present

## 2014-08-27 DIAGNOSIS — R569 Unspecified convulsions: Secondary | ICD-10-CM | POA: Diagnosis not present

## 2014-08-27 DIAGNOSIS — N2581 Secondary hyperparathyroidism of renal origin: Secondary | ICD-10-CM | POA: Diagnosis present

## 2014-08-27 DIAGNOSIS — I10 Essential (primary) hypertension: Secondary | ICD-10-CM | POA: Diagnosis present

## 2014-08-27 DIAGNOSIS — Z681 Body mass index (BMI) 19 or less, adult: Secondary | ICD-10-CM

## 2014-08-27 DIAGNOSIS — R197 Diarrhea, unspecified: Secondary | ICD-10-CM | POA: Diagnosis present

## 2014-08-27 DIAGNOSIS — E869 Volume depletion, unspecified: Secondary | ICD-10-CM | POA: Diagnosis not present

## 2014-08-27 DIAGNOSIS — R627 Adult failure to thrive: Secondary | ICD-10-CM | POA: Diagnosis present

## 2014-08-27 DIAGNOSIS — Z95828 Presence of other vascular implants and grafts: Secondary | ICD-10-CM

## 2014-08-27 DIAGNOSIS — N179 Acute kidney failure, unspecified: Secondary | ICD-10-CM | POA: Diagnosis not present

## 2014-08-27 DIAGNOSIS — A419 Sepsis, unspecified organism: Secondary | ICD-10-CM | POA: Diagnosis present

## 2014-08-27 LAB — CBC
HCT: 32.2 % — ABNORMAL LOW (ref 39.0–52.0)
Hemoglobin: 10.5 g/dL — ABNORMAL LOW (ref 13.0–17.0)
MCH: 28.6 pg (ref 26.0–34.0)
MCHC: 32.6 g/dL (ref 30.0–36.0)
MCV: 87.7 fL (ref 78.0–100.0)
PLATELETS: 114 10*3/uL — AB (ref 150–400)
RBC: 3.67 MIL/uL — ABNORMAL LOW (ref 4.22–5.81)
RDW: 15.1 % (ref 11.5–15.5)
WBC: 8.9 10*3/uL (ref 4.0–10.5)

## 2014-08-27 LAB — COMPREHENSIVE METABOLIC PANEL
ALT: 25 U/L (ref 17–63)
AST: 29 U/L (ref 15–41)
Albumin: 2.4 g/dL — ABNORMAL LOW (ref 3.5–5.0)
Alkaline Phosphatase: 78 U/L (ref 38–126)
Anion gap: 10 (ref 5–15)
BILIRUBIN TOTAL: 0.6 mg/dL (ref 0.3–1.2)
BUN: 90 mg/dL — ABNORMAL HIGH (ref 6–20)
CALCIUM: 8.7 mg/dL — AB (ref 8.9–10.3)
CO2: 13 mmol/L — ABNORMAL LOW (ref 22–32)
Chloride: 115 mmol/L — ABNORMAL HIGH (ref 101–111)
Creatinine, Ser: 7.86 mg/dL — ABNORMAL HIGH (ref 0.61–1.24)
GFR calc Af Amer: 8 mL/min — ABNORMAL LOW (ref >60)
GFR, EST NON AFRICAN AMERICAN: 7 mL/min — AB (ref >60)
GLUCOSE: 128 mg/dL — AB (ref 65–99)
POTASSIUM: 4.3 mmol/L (ref 3.5–5.1)
SODIUM: 138 mmol/L (ref 135–145)
TOTAL PROTEIN: 6.7 g/dL (ref 6.5–8.1)

## 2014-08-27 LAB — CBG MONITORING, ED: Glucose-Capillary: 132 mg/dL — ABNORMAL HIGH (ref 65–99)

## 2014-08-27 LAB — URINALYSIS, ROUTINE W REFLEX MICROSCOPIC
BILIRUBIN URINE: NEGATIVE
GLUCOSE, UA: 100 mg/dL — AB
Ketones, ur: NEGATIVE mg/dL
LEUKOCYTES UA: NEGATIVE
NITRITE: NEGATIVE
PH: 5 (ref 5.0–8.0)
Specific Gravity, Urine: 1.016 (ref 1.005–1.030)
UROBILINOGEN UA: 0.2 mg/dL (ref 0.0–1.0)

## 2014-08-27 LAB — I-STAT CG4 LACTIC ACID, ED
LACTIC ACID, VENOUS: 0.61 mmol/L (ref 0.5–2.0)
Lactic Acid, Venous: 0.76 mmol/L (ref 0.5–2.0)

## 2014-08-27 LAB — URINE MICROSCOPIC-ADD ON

## 2014-08-27 LAB — I-STAT TROPONIN, ED: Troponin i, poc: 0.05 ng/mL (ref 0.00–0.08)

## 2014-08-27 MED ORDER — SODIUM CHLORIDE 0.9 % IV BOLUS (SEPSIS)
500.0000 mL | Freq: Once | INTRAVENOUS | Status: AC
  Administered 2014-08-27: 500 mL via INTRAVENOUS

## 2014-08-27 MED ORDER — VANCOMYCIN HCL IN DEXTROSE 1-5 GM/200ML-% IV SOLN
1000.0000 mg | Freq: Once | INTRAVENOUS | Status: AC
  Administered 2014-08-27: 1000 mg via INTRAVENOUS
  Filled 2014-08-27: qty 200

## 2014-08-27 MED ORDER — PIPERACILLIN-TAZOBACTAM IN DEX 2-0.25 GM/50ML IV SOLN
2.2500 g | Freq: Three times a day (TID) | INTRAVENOUS | Status: DC
  Administered 2014-08-28 (×2): 2.25 g via INTRAVENOUS
  Filled 2014-08-27 (×3): qty 50

## 2014-08-27 MED ORDER — ACETAMINOPHEN 325 MG PO TABS
650.0000 mg | ORAL_TABLET | Freq: Once | ORAL | Status: AC
  Administered 2014-08-27: 650 mg via ORAL
  Filled 2014-08-27: qty 2

## 2014-08-27 MED ORDER — PIPERACILLIN-TAZOBACTAM 3.375 G IVPB 30 MIN
3.3750 g | Freq: Once | INTRAVENOUS | Status: AC
  Administered 2014-08-27: 3.375 g via INTRAVENOUS
  Filled 2014-08-27: qty 50

## 2014-08-27 NOTE — ED Notes (Signed)
Celeste [phone] Sister is POA

## 2014-08-27 NOTE — ED Provider Notes (Addendum)
CSN: [id number]     Arrival date & time 08/27/14  1832 History   First MD Initiated Contact with Patient 08/27/14 1903     Chief Complaint  Patient presents with  . Heat Exposure   HPI Pt was sitting outside on the porch today.  He tried to go inside to get out of the heat.  Family called EMS because they were worried about him.  Temp on the scene was 103.  Pt denies feeling sick earlier today.  No cough.  No dysuria.  No headache.  Pt states he felt a little warm but he did not think he was getting that overheated.  He does have medical problems including kidney failure. Past Medical History  Diagnosis Date  . Hypertension   . CHF (congestive heart failure)   . Seizures   . Tuberculosis 1960's    "got the tx for it" (10/27/2013)  . Type II diabetes mellitus   . Hepatitis C   . Stroke 06/2013    "LLE paralyzed since" (10/27/2013)  . Arthritis     "left knee hurts" (10/27/2013)  . Chronic kidney disease (CKD), stage IV (severe)   . Hypoglycemia 11/03/2013  . Brittle diabetes   . DVT (deep venous thrombosis)   . Hx of medication noncompliance    Past Surgical History  Procedure Laterality Date  . Av fistula placement Left 09/11/2013    Procedure: Insertion of arteriovenous gortex graft;  Surgeon: Chuck Hint, MD;  Location: [facility] OR;  Service: Vascular;  Laterality: Left;  . Laceration repair Left ?2014    "cut my leg falling off a truck"  . Loop recorder implant  05/2012   Family History  Problem Relation Age of Onset  . COPD Mother   . Hypertension Mother   . Diabetes Mother    History  Substance Use Topics  . Smoking status: Current Every Day Smoker -- 0.25 packs/day for 42 years    Types: Cigarettes  . Smokeless tobacco: Never Used  . Alcohol Use: No    Review of Systems  All other systems reviewed and are negative.     Allergies  Pork-derived products  Home Medications   Prior to Admission medications   Medication Sig Start Date End Date Taking?  Authorizing Provider  amLODipine (NORVASC) 10 MG tablet Take 10 mg by mouth daily.   Yes Historical Provider, MD  aspirin 81 MG tablet Take 81 mg by mouth daily.   Yes Historical Provider, MD  atorvastatin (LIPITOR) 20 MG tablet Take 1 tablet (20 mg total) by mouth daily at 6 PM. 11/06/13  Yes Tora Kindred York, PA-C  calcitRIOL (ROCALTROL) 0.25 MCG capsule Take 1 capsule (0.25 mcg total) by mouth daily. 09/12/13  Yes Zannie Cove, MD  calcium acetate (PHOSLO) 667 MG capsule Take 667 mg by mouth 3 (three) times daily with meals.   Yes Historical Provider, MD  carvedilol (COREG) 6.25 MG tablet Take 3 tablets (18.75 mg total) by mouth 2 (two) times daily with a meal. 12/27/13  Yes Catarina Hartshorn, MD  furosemide (LASIX) 80 MG tablet Take 2 tablets (160 mg total) by mouth 2 (two) times daily. 01/18/14  Yes Vassie Loll, MD  insulin aspart (NOVOLOG) 100 UNIT/ML injection Inject 0-12 Units into the skin 3 (three) times daily with meals. SSI  BS  150-200  2 u    To be given SQ                 201-250  4  u                 251-300  6 u                 301-350  8 u                  351-400  10 u                   > 400  12 u and call MD 11/06/13  Yes Tora Kindred York, PA-C  insulin glargine (LANTUS) 100 UNIT/ML injection Inject 0.05 mLs (5 Units total) into the skin at bedtime. 01/18/14  Yes Vassie Loll, MD  isosorbide mononitrate (IMDUR) 60 MG 24 hr tablet Take 1 tablet (60 mg total) by mouth daily. 12/25/13  Yes Catarina Hartshorn, MD  ranitidine (ZANTAC) 150 MG tablet Take 150 mg by mouth daily.   Yes Historical Provider, MD  saccharomyces boulardii (FLORASTOR) 250 MG capsule Take 1 capsule (250 mg total) by mouth 2 (two) times daily. 01/18/14  Yes Vassie Loll, MD  sevelamer carbonate (RENVELA) 800 MG tablet Take 1,600 mg by mouth 3 (three) times daily with meals.   Yes Historical Provider, MD  sodium bicarbonate 650 MG tablet Take 2 tablets (1,300 mg total) by mouth 2 (two) times daily. Patient taking differently:  Take 650 mg by mouth 4 (four) times daily.  10/29/13  Yes Christiane Ha, MD  thiamine (VITAMIN B-1) 100 MG tablet Take 100 mg by mouth every morning.   Yes Historical Provider, MD  warfarin (COUMADIN) 2 MG tablet Take 2 mg by mouth daily.   Yes Historical Provider, MD  acetaminophen (TYLENOL) 325 MG tablet Take 2 tablets (650 mg total) by mouth every 6 (six) hours as needed for mild pain (or Fever >/= 101). Patient not taking: Reported on 01/14/2014 11/06/13   Tora Kindred York, PA-C  enoxaparin (LOVENOX) 60 MG/0.6ML injection Inject 0.55 mLs (55 mg total) into the skin daily. To be taking daily until INR therapeutic X 2 days Patient not taking: Reported on 08/27/2014 01/18/14   Vassie Loll, MD  glucose blood test strip Use as instructed 11/01/13   Terri Piedra, PA-C  Nutritional Supplements (FEEDING SUPPLEMENT, NEPRO CARB STEADY,) LIQD Take 237 mLs by mouth 3 (three) times daily between meals. Patient not taking: Reported on 08/27/2014 12/25/13   Catarina Hartshorn, MD  vancomycin (VANCOCIN) 50 mg/mL oral solution Take 2.5 mLs (125 mg total) by mouth every 6 (six) hours. Patient not taking: Reported on 08/27/2014 01/28/14   Vassie Loll, MD  warfarin (COUMADIN) 5 MG tablet Take 1 tablet (5 mg total) by mouth daily. Patient not taking: Reported on 08/27/2014 01/18/14   Vassie Loll, MD   BP 141/91 mmHg  Pulse 75  Temp(Src) 103.4 F (39.7 C) (Rectal)  Resp 24  SpO2 97% Physical Exam  Constitutional: No distress.  Thin, underweight   HENT:  Head: Normocephalic and atraumatic.  Right Ear: External ear normal.  Left Ear: External ear normal.  Mouth/Throat: No oropharyngeal exudate (mm dry).  Eyes: Conjunctivae are normal. Right eye exhibits no discharge. Left eye exhibits no discharge. No scleral icterus.  Neck: Neck supple. No tracheal deviation present.  Cardiovascular: Normal rate, regular rhythm and intact distal pulses.   Pulmonary/Chest: Effort normal and breath sounds normal. No  stridor. No respiratory distress. He has no wheezes. He has no rales.  Abdominal: Soft. Bowel sounds are normal. He exhibits no distension. There is no tenderness.  There is no rebound and no guarding.  Musculoskeletal: He exhibits no edema or tenderness.  Neurological: He is alert. He has normal strength. No cranial nerve deficit (no facial droop, extraocular movements intact, no slurred speech) or sensory deficit. He exhibits normal muscle tone. He displays no seizure activity. Coordination normal.  Skin: Skin is warm and dry. No rash noted.  Psychiatric: He has a normal mood and affect.  Nursing note and vitals reviewed.   ED Course  Procedures (including critical care time) Labs Review Labs Reviewed  COMPREHENSIVE METABOLIC PANEL - Abnormal; Notable for the following:    Chloride 115 (*)    CO2 13 (*)    Glucose, Bld 128 (*)    BUN 90 (*)    Creatinine, Ser 7.86 (*)    Calcium 8.7 (*)    Albumin 2.4 (*)    GFR calc non Af Amer 7 (*)    GFR calc Af Amer 8 (*)    All other components within normal limits  CBC - Abnormal; Notable for the following:    RBC 3.67 (*)    Hemoglobin 10.5 (*)    HCT 32.2 (*)    Platelets 114 (*)    All other components within normal limits  URINALYSIS, ROUTINE W REFLEX MICROSCOPIC (NOT AT [facility]) - Abnormal; Notable for the following:    APPearance TURBID (*)    Glucose, UA 100 (*)    Hgb urine dipstick MODERATE (*)    Protein, ur >300 (*)    All other components within normal limits  CBG MONITORING, ED - Abnormal; Notable for the following:    Glucose-Capillary 132 (*)    All other components within normal limits  CULTURE, BLOOD (ROUTINE X 2)  CULTURE, BLOOD (ROUTINE X 2)  URINE CULTURE  URINE MICROSCOPIC-ADD ON  I-STAT TROPOININ, ED  I-STAT CG4 LACTIC ACID, ED  I-STAT CG4 LACTIC ACID, ED  I-STAT CG4 LACTIC ACID, ED  I-STAT CG4 LACTIC ACID, ED    Imaging Review Dg Chest Port 1 View  08/27/2014   CLINICAL DATA:  Heat exposure,  lightheadedness  EXAM: PORTABLE CHEST - 1 VIEW  COMPARISON:  January 17, 2014  FINDINGS: The heart size and mediastinal contours are stable. The heart size is enlarged. There is a right pleural effusion with consolidation of right lung base chronic compared prior exam. The left lung is clear. There is no pulmonary edema. The visualized skeletal structures are unremarkable.  IMPRESSION: Chronic right pleural effusion with consolidation of right lung base unchanged. Cardiomegaly.   Electronically Signed   By: Sherian Rein M.D.   On: 08/27/2014 19:30     EKG Interpretation   Date/Time:  Thursday August 27 2014 18:35:11 EDT Ventricular Rate:  79 PR Interval:  180 QRS Duration: 133 QT Interval:  425 QTC Calculation: 487 R Axis:   85 Text Interpretation:  Sinus rhythm Right bundle branch block No  significant change since last tracing Confirmed by Charne Mcbrien  MD-J, Bently Morath  ([id number]) on 08/27/2014 6:49:55 PM     Medications  piperacillin-tazobactam (ZOSYN) IVPB 2.25 g (not administered)  acetaminophen (TYLENOL) tablet 650 mg (not administered)  sodium chloride 0.9 % bolus 500 mL (not administered)  sodium chloride 0.9 % bolus 500 mL (500 mLs Intravenous New Bag/Given 08/27/14 2007)  piperacillin-tazobactam (ZOSYN) IVPB 3.375 g (0 g Intravenous Stopped 08/27/14 2101)  vancomycin (VANCOCIN) IVPB 1000 mg/200 mL premix (1,000 mg Intravenous New Bag/Given 08/27/14 2102)    MDM   Final diagnoses:  Chronic renal  failure, unspecified stage  Fever, unspecified fever cause    Patient's laboratory tests show worsening renal failure. He does have a metabolic acidosis now.  Patient may end up requiring dialysis. Plan on admission to the hospital with nephrology consultation.  Notified Dr Briant Cedar of patients admission.  Patient also has fever. I favor  infection over heat stroke. When I was able to speak to his family further they indicated that he felt chilled all day and that's why he was sitting outside.  No  source of infection at this time.. Empiric abx initiated.  Linwood Dibbles, MD 08/27/14 2207  Linwood Dibbles, MD 08/27/14 [phone]

## 2014-08-27 NOTE — ED Notes (Addendum)
Per ems- pt comes from home where today he sat on the porch all afternoon. Pt feels warm to touch. Is alert but disoriented to time. Given 500 cc fluid, BP 180/90. Also given 1,000 mg tylenol for tympanic temp 103.4. Pt is potential dialysis pt with fistula to left arm, was told today that he need to start dialysis. Pt is incontinent of urine.

## 2014-08-27 NOTE — H&P (Signed)
[facility] History and Physical  [name] [name] ONG:[id number] DOB: 08/27/1955 DOA: 08/27/2014  Referring physician: Linwood Dibbles, MD PCP: [name] Blood, MD   Chief Complaint: Fever.  HPI: [name] [name] is a 59 y.o. male with a past medical history of hypertension, CHF, seizures type 2 diabetes, hep C, seizures, stage IV CKD who recently had a fistula placed anticipating future hemodialysis and who was brought to the ER via EMS due to fever and mildly decreased mentation at home per relatives. Apparently the patient has been complaining that he was cold inside the house and went out to his porch and sat down for a while until family members noticed that he was febrile and mildly altered. Then they decided to call EMS and bring the patient to the ER.  He is currently in no acute distress, but does not remember much of the details of what happened.    Review of Systems:  Constitutional:  No weight loss, night sweats, Positive Fevers, chills, fatigue.  HEENT:  Positive headaches,  Denies Difficulty swallowing,Tooth/dental problems,Sore throat,  No sneezing, itching, ear ache, nasal congestion, post nasal drip,  Cardio-vascular:  No chest pain, Orthopnea, PND, swelling in lower extremities, anasarca, dizziness, palpitations  GI:  Daily diarrhea for at least a week for at least 3 times a day.  No heartburn, indigestion, abdominal pain, nausea, vomiting,  change in bowel habits, loss of appetite  Resp:  No shortness of breath with exertion or at rest. No excess mucus, no productive cough, No non-productive cough, No coughing up of [name].No change in color of mucus.No wheezing.No chest wall deformity  Skin:  no rash or lesions.  GU:  no dysuria, change in color of urine, no urgency or frequency. No flank pain.  Musculoskeletal:  No joint pain or swelling. No decreased range of motion. No back pain.  Psych:  No change in mood or affect. No depression or anxiety. No memory loss.    Past Medical History  Diagnosis Date  . Hypertension   . CHF (congestive heart failure)   . Seizures   . Tuberculosis 1960's    "got the tx for it" (10/27/2013)  . Type II diabetes mellitus   . Hepatitis C   . Stroke 06/2013    "LLE paralyzed since" (10/27/2013)  . Arthritis     "left knee hurts" (10/27/2013)  . Chronic kidney disease (CKD), stage IV (severe)   . Hypoglycemia 11/03/2013  . Brittle diabetes   . DVT (deep venous thrombosis)   . Hx of medication noncompliance    Past Surgical History  Procedure Laterality Date  . Av fistula placement Left 09/11/2013    Procedure: Insertion of arteriovenous gortex graft;  Surgeon: [name] Hint, MD;  Location: [facility] OR;  Service: Vascular;  Laterality: Left;  . Laceration repair Left ?2014    "cut my leg falling off a truck"  . Loop recorder implant  05/2012   Social History:  reports that he has been smoking Cigarettes.  He has a 10.5 pack-year smoking history. He has never used smokeless tobacco. He reports that he does not drink alcohol or use illicit drugs.  Allergies  Allergen Reactions  . Pork-Derived Conservation officer, nature and Other (See Comments)    The patient has tolerated lovenox    Family History  Problem Relation Age of Onset  . COPD Mother   . Hypertension Mother   . Diabetes Mother     Prior to Admission medications   Medication Sig Start Date End Date Taking?  Authorizing Provider  amLODipine (NORVASC) 10 MG tablet Take 10 mg by mouth daily.   Yes Historical Provider, MD  aspirin 81 MG tablet Take 81 mg by mouth daily.   Yes Historical Provider, MD  atorvastatin (LIPITOR) 20 MG tablet Take 1 tablet (20 mg total) by mouth daily at 6 PM. 11/06/13  Yes [name] Kindred York, PA-C  calcitRIOL (ROCALTROL) 0.25 MCG capsule Take 1 capsule (0.25 mcg total) by mouth daily. 09/12/13  Yes [name] Cove, MD  calcium acetate (PHOSLO) 667 MG capsule Take 667 mg by mouth 3 (three) times daily with meals.   Yes Historical Provider, MD   carvedilol (COREG) 6.25 MG tablet Take 3 tablets (18.75 mg total) by mouth 2 (two) times daily with a meal. 12/27/13  Yes [name] Hartshorn, MD  furosemide (LASIX) 80 MG tablet Take 2 tablets (160 mg total) by mouth 2 (two) times daily. 01/18/14  Yes [name] Loll, MD  insulin aspart (NOVOLOG) 100 UNIT/ML injection Inject 0-12 Units into the skin 3 (three) times daily with meals. SSI  BS  150-200  2 u    To be given SQ                 201-250  4 u                 251-300  6 u                 301-350  8 u                  351-400  10 u                   > 400  12 u and call MD 11/06/13  Yes [name] Kindred York, PA-C  insulin glargine (LANTUS) 100 UNIT/ML injection Inject 0.05 mLs (5 Units total) into the skin at bedtime. 01/18/14  Yes [name] Loll, MD  isosorbide mononitrate (IMDUR) 60 MG 24 hr tablet Take 1 tablet (60 mg total) by mouth daily. 12/25/13  Yes [name] Hartshorn, MD  ranitidine (ZANTAC) 150 MG tablet Take 150 mg by mouth daily.   Yes Historical Provider, MD  saccharomyces boulardii (FLORASTOR) 250 MG capsule Take 1 capsule (250 mg total) by mouth 2 (two) times daily. 01/18/14  Yes [name] Loll, MD  sevelamer carbonate (RENVELA) 800 MG tablet Take 1,600 mg by mouth 3 (three) times daily with meals.   Yes Historical Provider, MD  sodium bicarbonate 650 MG tablet Take 2 tablets (1,300 mg total) by mouth 2 (two) times daily. Patient taking differently: Take 650 mg by mouth 4 (four) times daily.  10/29/13  Yes [name] Ha, MD  thiamine (VITAMIN B-1) 100 MG tablet Take 100 mg by mouth every morning.   Yes Historical Provider, MD  warfarin (COUMADIN) 2 MG tablet Take 2 mg by mouth daily.   Yes Historical Provider, MD  acetaminophen (TYLENOL) 325 MG tablet Take 2 tablets (650 mg total) by mouth every 6 (six) hours as needed for mild pain (or Fever >/= 101). Patient not taking: Reported on 01/14/2014 11/06/13   [name] Kindred York, PA-C  enoxaparin (LOVENOX) 60 MG/0.6ML injection Inject 0.55 mLs (55 mg total)  into the skin daily. To be taking daily until INR therapeutic X 2 days Patient not taking: Reported on 08/27/2014 01/18/14   [name] Loll, MD  glucose [name] test strip Use as instructed 11/01/13   Terri Piedra, PA-C  Nutritional Supplements (FEEDING SUPPLEMENT, NEPRO CARB STEADY,) LIQD  Take 237 mLs by mouth 3 (three) times daily between meals. Patient not taking: Reported on 08/27/2014 12/25/13   [name] Hartshorn, MD  vancomycin (VANCOCIN) 50 mg/mL oral solution Take 2.5 mLs (125 mg total) by mouth every 6 (six) hours. Patient not taking: Reported on 08/27/2014 01/28/14   [name] Loll, MD  warfarin (COUMADIN) 5 MG tablet Take 1 tablet (5 mg total) by mouth daily. Patient not taking: Reported on 08/27/2014 01/18/14   [name] Loll, MD   Physical Exam: Filed Vitals:   08/27/14 2130 08/27/14 2145 08/27/14 2225 08/27/14 2230  BP: 176/69 169/65 188/69 171/67  Pulse: 72 72 70 68  Temp:      TempSrc:      Resp: SpO2: 95% 96% 96% 95%    Wt Readings from Last 3 Encounters:  07/24/14 54.432 kg (120 lb)  01/18/14 56.745 kg (125 lb 1.6 oz)  01/06/14 54.432 kg (120 lb)    General:  Febrile, but appears calm and comfortable Eyes: PERRL, normal lids, irises & conjunctiva ENT: grossly normal hearing, lips & tongue were dry Neck: no LAD, masses or thyromegaly Cardiovascular: RRR, no m/r/g. No LE edema. Telemetry: SR, no arrhythmias  Respiratory: CTA bilaterally, no w/r/r. Normal respiratory effort. Abdomen: soft, ntnd Skin: no rash or induration seen on limited exam Musculoskeletal: grossly normal tone BUE/BLE Psychiatric: grossly normal mood and affect, speech fluent and appropriate Neurologic: grossly non-focal.          Labs on Admission:  Basic Metabolic Panel:  Recent Labs Lab 08/27/14 1910  NA 138  K 4.3  CL 115*  CO2 13*  GLUCOSE 128*  BUN 90*  CREATININE 7.86*  CALCIUM 8.7*   Liver Function Tests:  Recent Labs Lab 08/27/14 1910  AST 29  ALT 25  ALKPHOS  78  BILITOT 0.6  PROT 6.7  ALBUMIN 2.4*   No results for input(s): LIPASE, AMYLASE in the last 168 hours. No results for input(s): AMMONIA in the last 168 hours. CBC:  Recent Labs Lab 08/21/14 0949 08/27/14 1910  WBC  --  8.9  HGB 12.3* 10.5*  HCT  --  32.2*  MCV  --  87.7  PLT  --  114*   Cardiac Enzymes: No results for input(s): CKTOTAL, CKMB, CKMBINDEX, TROPONINI in the last 168 hours.  BNP (last 3 results) No results for input(s): BNP in the last 8760 hours.  ProBNP (last 3 results) No results for input(s): PROBNP in the last 8760 hours.  CBG:  Recent Labs Lab 08/27/14 1855  GLUCAP 132*    Radiological Exams on Admission: Dg Chest Port 1 View  08/27/2014   CLINICAL DATA:  Heat exposure, lightheadedness  EXAM: PORTABLE CHEST - 1 VIEW  COMPARISON:  January 17, 2014  FINDINGS: The heart size and mediastinal contours are stable. The heart size is enlarged. There is a right pleural effusion with consolidation of right lung base chronic compared prior exam. The left lung is clear. There is no pulmonary edema. The visualized skeletal structures are unremarkable.  IMPRESSION: Chronic right pleural effusion with consolidation of right lung base unchanged. Cardiomegaly.   Electronically Signed   By: Sherian Rein M.D.   On: 08/27/2014 19:30    EKG: Independently reviewed.  Vent. rate 79 BPM PR interval 180 ms QRS duration 133 ms QT/QTc 425/487 ms P-R-T axes 81 85 62 Sinus rhythm Right bundle branch block No significant change since last tracing   Assessment/Plan Principal Problem:   Fever Active Problems:  Hypertension   H/O: CVA (cerebrovascular accident)   End stage renal disease   DM (diabetes mellitus), type 2 with renal complications   Anemia of renal disease   Diarrhea   1. Admitted to the hospital to continue empiric antibiotic therapy. Pharmacy consulted to calculate doses adjusted to his renal parameters. Follow-up cultures and  sensitivities. 2. Continue current antihypertensive therapies and monitor BP. Nephrology was consulted by the emergency department, please follow-up consult. 3. Continue current insulin regimen, monitor CBG and provide corrective insulin treatment. 4. Monitor H&H. 5. Stool workup sent for diarrhea. Please follow-up results.    Code Status: Full code. DVT Prophylaxis: Lovenox SQ. Family Communication:  Ransome,Celeste Sister   [phone]  Disposition Plan: Home with outpatient follow-up.  Time spent: Over 60 minutes.  Bobette Mo [facility] Pager [phone].

## 2014-08-27 NOTE — Progress Notes (Signed)
ANTIBIOTIC CONSULT NOTE - INITIAL  Pharmacy Consult for Vancomycin + Zosyn Indication: rule out sepsis  Allergies  Allergen Reactions  . Pork-Derived Conservation officer, nature and Other (See Comments)    The patient has tolerated lovenox    Patient Measurements:    Wt: 54.4 kg  Vital Signs: Temp: 103.4 F (39.7 C) (07/28 1843) Temp Source: Rectal (07/28 1843) BP: 192/77 mmHg (07/28 1843) Pulse Rate: 80 (07/28 1843) Intake/Output from previous day:   Intake/Output from this shift:    Labs: No results for input(s): WBC, HGB, PLT, LABCREA, CREATININE in the last 72 hours. CrCl cannot be calculated (Unknown ideal weight.). No results for input(s): VANCOTROUGH, VANCOPEAK, VANCORANDOM, GENTTROUGH, GENTPEAK, GENTRANDOM, TOBRATROUGH, TOBRAPEAK, TOBRARND, AMIKACINPEAK, AMIKACINTROU, AMIKACIN in the last 72 hours.   Microbiology: No results found for this or any previous visit (from the past 720 hour(s)).  Medical History: Past Medical History  Diagnosis Date  . Hypertension   . CHF (congestive heart failure)   . Seizures   . Tuberculosis 1960's    "got the tx for it" (10/27/2013)  . Type II diabetes mellitus   . Hepatitis C   . Stroke 06/2013    "LLE paralyzed since" (10/27/2013)  . Arthritis     "left knee hurts" (10/27/2013)  . Chronic kidney disease (CKD), stage IV (severe)   . Hypoglycemia 11/03/2013  . Brittle diabetes   . DVT (deep venous thrombosis)   . Hx of medication noncompliance     Assessment: 38 YOM who presented to the [facility] on 7/28 with overheating after sitting outside all day. Pharmacy consulted to start Vancomycin + Zosyn for empiric coverage. The patient is noted to be CKD IV-V with fistula in place but yet to start HD. Will load the patient and await renal plans to determine need for additional doses.   Vanc 1g x 1 and Zosyn 3.375g x 1 ordered by the EDP  Goal of Therapy:  Vancomycin trough level 15-20 mcg/ml  Plan:  1. Vancomycin 1g IV x 1 dose 2. No  additional Vancomycin doses at this time 3. Zosyn 3.375g IV x 1 followed by 2.25g IV every 8 hours 4. Will follow-up renal plans and residual kidney function to determine need for additional doses  Georgina Pillion, PharmD, BCPS Clinical Pharmacist Pager: [phone] 08/27/2014 8:23 PM

## 2014-08-28 DIAGNOSIS — N186 End stage renal disease: Secondary | ICD-10-CM

## 2014-08-28 DIAGNOSIS — A047 Enterocolitis due to Clostridium difficile: Secondary | ICD-10-CM

## 2014-08-28 DIAGNOSIS — E1129 Type 2 diabetes mellitus with other diabetic kidney complication: Secondary | ICD-10-CM

## 2014-08-28 DIAGNOSIS — A0472 Enterocolitis due to Clostridium difficile, not specified as recurrent: Secondary | ICD-10-CM | POA: Diagnosis present

## 2014-08-28 LAB — COMPREHENSIVE METABOLIC PANEL
ALT: 24 U/L (ref 17–63)
AST: 26 U/L (ref 15–41)
Albumin: 2.2 g/dL — ABNORMAL LOW (ref 3.5–5.0)
Alkaline Phosphatase: 72 U/L (ref 38–126)
Anion gap: 5 (ref 5–15)
BUN: 87 mg/dL — ABNORMAL HIGH (ref 6–20)
CO2: 15 mmol/L — ABNORMAL LOW (ref 22–32)
Calcium: 8.2 mg/dL — ABNORMAL LOW (ref 8.9–10.3)
Chloride: 118 mmol/L — ABNORMAL HIGH (ref 101–111)
Creatinine, Ser: 7.94 mg/dL — ABNORMAL HIGH (ref 0.61–1.24)
GFR calc Af Amer: 8 mL/min — ABNORMAL LOW (ref >60)
GFR calc non Af Amer: 7 mL/min — ABNORMAL LOW (ref >60)
Glucose, Bld: 150 mg/dL — ABNORMAL HIGH (ref 65–99)
Potassium: 4 mmol/L (ref 3.5–5.1)
Sodium: 138 mmol/L (ref 135–145)
Total Bilirubin: 0.8 mg/dL (ref 0.3–1.2)
Total Protein: 6.8 g/dL (ref 6.5–8.1)

## 2014-08-28 LAB — URINALYSIS, ROUTINE W REFLEX MICROSCOPIC
Bilirubin Urine: NEGATIVE
Glucose, UA: 100 mg/dL — AB
Ketones, ur: NEGATIVE mg/dL
Leukocytes, UA: NEGATIVE
Nitrite: NEGATIVE
Protein, ur: >300 mg/dL — AB
Specific Gravity, Urine: 1.014 (ref 1.005–1.030)
Urobilinogen, UA: 0.2 mg/dL (ref 0.0–1.0)
pH: 5 (ref 5.0–8.0)

## 2014-08-28 LAB — CBC WITH DIFFERENTIAL/PLATELET
Basophils Absolute: 0 10*3/uL (ref 0.0–0.1)
Basophils Relative: 0 % (ref 0–1)
Eosinophils Absolute: 0.1 10*3/uL (ref 0.0–0.7)
Eosinophils Relative: 1 % (ref 0–5)
HCT: 31.5 % — ABNORMAL LOW (ref 39.0–52.0)
Hemoglobin: 10.5 g/dL — ABNORMAL LOW (ref 13.0–17.0)
Lymphocytes Relative: 17 % (ref 12–46)
Lymphs Abs: 1.6 10*3/uL (ref 0.7–4.0)
MCH: 29 pg (ref 26.0–34.0)
MCHC: 33.3 g/dL (ref 30.0–36.0)
MCV: 87 fL (ref 78.0–100.0)
Monocytes Absolute: 0.6 10*3/uL (ref 0.1–1.0)
Monocytes Relative: 7 % (ref 3–12)
Neutro Abs: 6.9 10*3/uL (ref 1.7–7.7)
Neutrophils Relative %: 75 % (ref 43–77)
Platelets: 113 10*3/uL — ABNORMAL LOW (ref 150–400)
RBC: 3.62 MIL/uL — ABNORMAL LOW (ref 4.22–5.81)
RDW: 14.9 % (ref 11.5–15.5)
WBC: 9.2 10*3/uL (ref 4.0–10.5)

## 2014-08-28 LAB — URINE MICROSCOPIC-ADD ON

## 2014-08-28 LAB — PROTIME-INR
INR: 1.83 — ABNORMAL HIGH (ref 0.00–1.49)
Prothrombin Time: 21.1 seconds — ABNORMAL HIGH (ref 11.6–15.2)

## 2014-08-28 LAB — CREATININE, URINE, RANDOM: Creatinine, Urine: 79.63 mg/dL

## 2014-08-28 LAB — CLOSTRIDIUM DIFFICILE BY PCR: Toxigenic C. Difficile by PCR: POSITIVE — AB

## 2014-08-28 LAB — SODIUM, URINE, RANDOM: SODIUM UR: 46 mmol/L

## 2014-08-28 MED ORDER — WARFARIN - PHYSICIAN DOSING INPATIENT
Freq: Every day | Status: DC
  Administered 2014-08-28: 18:00:00

## 2014-08-28 MED ORDER — VANCOMYCIN 50 MG/ML ORAL SOLUTION: 125.0000 mg | Freq: Two times a day (BID) | ORAL | Status: DC

## 2014-08-28 MED ORDER — VANCOMYCIN 50 MG/ML ORAL SOLUTION: 125.0000 mg | ORAL | Status: DC

## 2014-08-28 MED ORDER — ATORVASTATIN CALCIUM 20 MG PO TABS
20.0000 mg | ORAL_TABLET | Freq: Every day | ORAL | Status: DC
  Administered 2014-08-28 – 2014-09-02 (×6): 20 mg via ORAL
  Filled 2014-08-28 (×6): qty 1

## 2014-08-28 MED ORDER — CALCITRIOL 0.25 MCG PO CAPS
0.2500 ug | ORAL_CAPSULE | Freq: Every day | ORAL | Status: DC
  Administered 2014-08-28 – 2014-09-02 (×6): 0.25 ug via ORAL
  Filled 2014-08-28 (×6): qty 1

## 2014-08-28 MED ORDER — SODIUM BICARBONATE 8.4 % IV SOLN
INTRAVENOUS | Status: DC
  Administered 2014-08-28 – 2014-08-31 (×5): via INTRAVENOUS
  Filled 2014-08-28 (×7): qty 150

## 2014-08-28 MED ORDER — ACETAMINOPHEN 325 MG PO TABS: 650.0000 mg | ORAL_TABLET | Freq: Four times a day (QID) | ORAL | Status: DC | PRN

## 2014-08-28 MED ORDER — VITAMIN B-1 100 MG PO TABS
100.0000 mg | ORAL_TABLET | Freq: Every morning | ORAL | Status: DC
  Administered 2014-08-28 – 2014-09-02 (×6): 100 mg via ORAL
  Filled 2014-08-28 (×6): qty 1

## 2014-08-28 MED ORDER — SACCHAROMYCES BOULARDII 250 MG PO CAPS
250.0000 mg | ORAL_CAPSULE | Freq: Two times a day (BID) | ORAL | Status: DC
  Administered 2014-08-28 – 2014-09-02 (×11): 250 mg via ORAL
  Filled 2014-08-28 (×11): qty 1

## 2014-08-28 MED ORDER — VANCOMYCIN 50 MG/ML ORAL SOLUTION
125.0000 mg | Freq: Four times a day (QID) | ORAL | Status: DC
  Filled 2014-08-28 (×3): qty 2.5

## 2014-08-28 MED ORDER — SEVELAMER CARBONATE 800 MG PO TABS
1600.0000 mg | ORAL_TABLET | Freq: Three times a day (TID) | ORAL | Status: DC
  Administered 2014-08-28 – 2014-09-01 (×13): 1600 mg via ORAL
  Filled 2014-08-28 (×13): qty 2

## 2014-08-28 MED ORDER — SODIUM CHLORIDE 0.45 % IV SOLN
INTRAVENOUS | Status: DC
  Administered 2014-08-28 (×2): via INTRAVENOUS

## 2014-08-28 MED ORDER — ONDANSETRON HCL 4 MG PO TABS: 4.0000 mg | ORAL_TABLET | Freq: Four times a day (QID) | ORAL | Status: DC | PRN

## 2014-08-28 MED ORDER — SODIUM BICARBONATE 650 MG PO TABS
650.0000 mg | ORAL_TABLET | Freq: Four times a day (QID) | ORAL | Status: DC
  Administered 2014-08-28: 650 mg via ORAL
  Filled 2014-08-28: qty 1

## 2014-08-28 MED ORDER — VANCOMYCIN 50 MG/ML ORAL SOLUTION
125.0000 mg | Freq: Four times a day (QID) | ORAL | Status: DC
Start: 2014-08-28 — End: 2014-09-02
  Administered 2014-08-28 – 2014-09-02 (×19): 125 mg via ORAL
  Filled 2014-08-28 (×25): qty 2.5

## 2014-08-28 MED ORDER — ACETAMINOPHEN 650 MG RE SUPP: 650.0000 mg | Freq: Four times a day (QID) | RECTAL | Status: DC | PRN

## 2014-08-28 MED ORDER — VANCOMYCIN 50 MG/ML ORAL SOLUTION: 125.0000 mg | Freq: Every day | ORAL | Status: DC

## 2014-08-28 MED ORDER — CALCIUM ACETATE (PHOS BINDER) 667 MG PO CAPS
667.0000 mg | ORAL_CAPSULE | Freq: Three times a day (TID) | ORAL | Status: DC
  Administered 2014-08-28 – 2014-09-02 (×17): 667 mg via ORAL
  Filled 2014-08-28 (×17): qty 1

## 2014-08-28 MED ORDER — FUROSEMIDE 80 MG PO TABS
160.0000 mg | ORAL_TABLET | Freq: Two times a day (BID) | ORAL | Status: DC
  Administered 2014-08-28 – 2014-09-01 (×9): 160 mg via ORAL
  Filled 2014-08-28 (×6): qty 2
  Filled 2014-08-28: qty 4
  Filled 2014-08-28: qty 2
  Filled 2014-08-28: qty 8

## 2014-08-28 MED ORDER — AMLODIPINE BESYLATE 10 MG PO TABS
10.0000 mg | ORAL_TABLET | Freq: Every day | ORAL | Status: DC
  Administered 2014-08-28 – 2014-09-02 (×6): 10 mg via ORAL
  Filled 2014-08-28 (×6): qty 1

## 2014-08-28 MED ORDER — NEPRO/CARBSTEADY PO LIQD
237.0000 mL | Freq: Two times a day (BID) | ORAL | Status: DC
  Administered 2014-08-28 – 2014-09-01 (×8): 237 mL via ORAL

## 2014-08-28 MED ORDER — ISOSORBIDE MONONITRATE ER 60 MG PO TB24
60.0000 mg | ORAL_TABLET | Freq: Every day | ORAL | Status: DC
Start: 2014-08-28 — End: 2014-09-02
  Administered 2014-08-28 – 2014-09-02 (×6): 60 mg via ORAL
  Filled 2014-08-28 (×6): qty 1

## 2014-08-28 MED ORDER — SODIUM CHLORIDE 0.9 % IJ SOLN
3.0000 mL | Freq: Two times a day (BID) | INTRAMUSCULAR | Status: DC
  Administered 2014-08-28 – 2014-09-02 (×9): 3 mL via INTRAVENOUS

## 2014-08-28 MED ORDER — WARFARIN SODIUM 2 MG PO TABS
2.0000 mg | ORAL_TABLET | Freq: Every day | ORAL | Status: DC
  Administered 2014-08-28: 2 mg via ORAL
  Filled 2014-08-28: qty 1

## 2014-08-28 MED ORDER — WARFARIN SODIUM 2 MG PO TABS
2.0000 mg | ORAL_TABLET | Freq: Once | ORAL | Status: AC
  Administered 2014-08-28: 2 mg via ORAL
  Filled 2014-08-28: qty 1

## 2014-08-28 MED ORDER — ONDANSETRON HCL 4 MG/2ML IJ SOLN: 4.0000 mg | Freq: Four times a day (QID) | INTRAMUSCULAR | Status: DC | PRN

## 2014-08-28 MED ORDER — CARVEDILOL 6.25 MG PO TABS
18.7500 mg | ORAL_TABLET | Freq: Two times a day (BID) | ORAL | Status: DC
  Administered 2014-08-28 – 2014-09-02 (×11): 18.75 mg via ORAL
  Filled 2014-08-28 (×22): qty 1

## 2014-08-28 MED ORDER — FAMOTIDINE 20 MG PO TABS
20.0000 mg | ORAL_TABLET | Freq: Two times a day (BID) | ORAL | Status: DC
  Administered 2014-08-28 (×2): 20 mg via ORAL
  Filled 2014-08-28 (×3): qty 1

## 2014-08-28 MED ORDER — WARFARIN - PHARMACIST DOSING INPATIENT
Freq: Every day | Status: DC
  Administered 2014-08-29 – 2014-09-01 (×3)

## 2014-08-28 MED ORDER — ASPIRIN 81 MG PO CHEW
81.0000 mg | CHEWABLE_TABLET | Freq: Every day | ORAL | Status: DC
  Administered 2014-08-28 – 2014-09-02 (×6): 81 mg via ORAL
  Filled 2014-08-28 (×6): qty 1

## 2014-08-28 NOTE — Consult Note (Signed)
Reason for Consult: Acute on Chronic Renal Failure Referring Physician: [facility]  HPI: [name] [name] is a 59 yo male with PMHx of CKD stage IV, HTN, CHF, seizures, T2DM, Hepatitis C who presented to the ED on 7/28 with complaint of lethargy, altered mental status and fever. Of note, patient recently had a fistula placed in anticipation of starting HD. No source of infection has been identified as CXR and UA were without signs of infection. Blood cultures, GI pathogen panel, and urine culture pending. Patient without leukocytosis. He was started on empiric antibiotic therapy. Patient was also noted to be in acute on chronic renal failure. Baseline creatinine appears to be around 4, with GFR of 18. On presentation, creatinine was 7.86> 7.94 this morning.   Patient was seen and examined this morning. His only complaint is of diarrhea which he states he has had for months. Patient states he follows with a nephrologist but is unsure of his name.    Past Medical History  Diagnosis Date  . Hypertension   . CHF (congestive heart failure)   . Seizures   . Tuberculosis 1960's    "got the tx for it" (10/27/2013)  . Type II diabetes mellitus   . Hepatitis C   . Stroke 06/2013    "LLE paralyzed since" (10/27/2013)  . Arthritis     "left knee hurts" (10/27/2013)  . Chronic kidney disease (CKD), stage IV (severe)   . Hypoglycemia 11/03/2013  . Brittle diabetes   . DVT (deep venous thrombosis)   . Hx of medication noncompliance     Past Surgical History  Procedure Laterality Date  . Av fistula placement Left 09/11/2013    Procedure: Insertion of arteriovenous gortex graft;  Surgeon: Angelia Mould, MD;  Location: [facility];  Service: Vascular;  Laterality: Left;  . Laceration repair Left ?2014    "cut my leg falling off a truck"  . Loop recorder implant  05/2012    Family History  Problem Relation Age of Onset  . COPD Mother   . Hypertension Mother   . Diabetes Mother     Social History:  reports  that he has been smoking Cigarettes.  He has a 10.5 pack-year smoking history. He has never used smokeless tobacco. He reports that he does not drink alcohol or use illicit drugs.  Allergies:  Allergies  Allergen Reactions  . Pork-Derived Metallurgist and Other (See Comments)    The patient has tolerated lovenox  . Lactose Intolerance (Gi)    Medications:  I have reviewed the patient's current medications. Prior to Admission:  Prescriptions prior to admission  Medication Sig Dispense Refill Last Dose  . amLODipine (NORVASC) 10 MG tablet Take 10 mg by mouth daily.   08/27/2014 at Unknown time  . aspirin 81 MG tablet Take 81 mg by mouth daily.   08/26/2014 at Unknown time  . atorvastatin (LIPITOR) 20 MG tablet Take 1 tablet (20 mg total) by mouth daily at 6 PM. 30 tablet 0 08/26/2014 at Unknown time  . calcitRIOL (ROCALTROL) 0.25 MCG capsule Take 1 capsule (0.25 mcg total) by mouth daily. 30 capsule 0 08/27/2014 at Unknown time  . calcium acetate (PHOSLO) 667 MG capsule Take 667 mg by mouth 3 (three) times daily with meals.   08/27/2014 at Unknown time  . carvedilol (COREG) 6.25 MG tablet Take 3 tablets (18.75 mg total) by mouth 2 (two) times daily with a meal. 180 tablet 0 08/27/2014 at 0800  . furosemide (LASIX) 80 MG tablet  Take 2 tablets (160 mg total) by mouth 2 (two) times daily. 30 tablet 0 08/27/2014 at Unknown time  . insulin aspart (NOVOLOG) 100 UNIT/ML injection Inject 0-12 Units into the skin 3 (three) times daily with meals. SSI  BS  150-200  2 u    To be given SQ                 201-250  4 u                 251-300  6 u                 301-350  8 u                  351-400  10 u                   > 400  12 u and call MD   Past Week at Unknown time  . insulin glargine (LANTUS) 100 UNIT/ML injection Inject 0.05 mLs (5 Units total) into the skin at bedtime. 10 mL 11 08/26/2014 at Unknown time  . isosorbide mononitrate (IMDUR) 60 MG 24 hr tablet Take 1 tablet (60 mg total) by mouth  daily. 30 tablet 0 08/27/2014 at Unknown time  . ranitidine (ZANTAC) 150 MG tablet Take 150 mg by mouth daily.   08/27/2014 at Unknown time  . saccharomyces boulardii (FLORASTOR) 250 MG capsule Take 1 capsule (250 mg total) by mouth 2 (two) times daily.   08/27/2014 at Unknown time  . sevelamer carbonate (RENVELA) 800 MG tablet Take 1,600 mg by mouth 3 (three) times daily with meals.   08/27/2014 at Unknown time  . sodium bicarbonate 650 MG tablet Take 2 tablets (1,300 mg total) by mouth 2 (two) times daily. (Patient taking differently: Take 650 mg by mouth 4 (four) times daily. ) 120 tablet 1 08/27/2014 at Unknown time  . thiamine (VITAMIN B-1) 100 MG tablet Take 100 mg by mouth every morning.   08/27/2014 at Unknown time  . warfarin (COUMADIN) 2 MG tablet Take 2 mg by mouth daily.   08/27/2014 at 1800  . acetaminophen (TYLENOL) 325 MG tablet Take 2 tablets (650 mg total) by mouth every 6 (six) hours as needed for mild pain (or Fever >/= 101). (Patient not taking: Reported on 01/14/2014)   Not Taking at Unknown time  . enoxaparin (LOVENOX) 60 MG/0.6ML injection Inject 0.55 mLs (55 mg total) into the skin daily. To be taking daily until INR therapeutic X 2 days (Patient not taking: Reported on 08/27/2014) 0 Syringe  Not Taking at Unknown time  . glucose blood test strip Use as instructed 100 each 12 Taking  . Nutritional Supplements (FEEDING SUPPLEMENT, NEPRO CARB STEADY,) LIQD Take 237 mLs by mouth 3 (three) times daily between meals. (Patient not taking: Reported on 08/27/2014) 90 Can 0 Not Taking at Unknown time  . vancomycin (VANCOCIN) 50 mg/mL oral solution Take 2.5 mLs (125 mg total) by mouth every 6 (six) hours. (Patient not taking: Reported on 08/27/2014)   Not Taking at Unknown time  . warfarin (COUMADIN) 5 MG tablet Take 1 tablet (5 mg total) by mouth daily. (Patient not taking: Reported on 08/27/2014)   Not Taking at Unknown time   Scheduled: . amLODipine  10 mg Oral Daily  . aspirin  81 mg Oral  Daily  . atorvastatin  20 mg Oral q1800  . calcitRIOL  0.25 mcg Oral Daily  . calcium acetate  667 mg Oral TID WC  . carvedilol  18.75 mg Oral BID WC  . famotidine  20 mg Oral BID  . furosemide  160 mg Oral BID  . isosorbide mononitrate  60 mg Oral Daily  . piperacillin-tazobactam (ZOSYN)  IV  2.25 g Intravenous Q8H  . saccharomyces boulardii  250 mg Oral BID  . sevelamer carbonate  1,600 mg Oral TID WC  . sodium bicarbonate  650 mg Oral QID  . sodium chloride  3 mL Intravenous Q12H  . thiamine  100 mg Oral q morning - 10a  . warfarin  2 mg Oral q1800  . Warfarin - Physician Dosing Inpatient   Does not apply q1800   Continuous: . sodium chloride 50 mL/hr at 08/28/14 0731   KCM:[id number] **OR** acetaminophen, ondansetron **OR** ondansetron (ZOFRAN) IV  Results for orders placed or performed during the hospital encounter of 08/27/14 (from the past 48 hour(s))  CBG monitoring, ED     Status: Abnormal   Collection Time: 08/27/14  6:55 PM  Result Value Ref Range   Glucose-Capillary 132 (H) 65 - 99 mg/dL  I-Stat Troponin, ED (not at [facility])     Status: None   Collection Time: 08/27/14  7:08 PM  Result Value Ref Range   Troponin i, poc 0.05 0.00 - 0.08 ng/mL   Comment 3            Comment: Due to the release kinetics of cTnI, a negative result within the first hours of the onset of symptoms does not rule out myocardial infarction with certainty. If myocardial infarction is still suspected, repeat the test at appropriate intervals.   Comprehensive metabolic panel     Status: Abnormal   Collection Time: 08/27/14  7:10 PM  Result Value Ref Range   Sodium 138 135 - 145 mmol/L   Potassium 4.3 3.5 - 5.1 mmol/L   Chloride 115 (H) 101 - 111 mmol/L   CO2 13 (L) 22 - 32 mmol/L   Glucose, Bld 128 (H) 65 - 99 mg/dL   BUN 90 (H) 6 - 20 mg/dL   Creatinine, Ser 7.86 (H) 0.61 - 1.24 mg/dL   Calcium 8.7 (L) 8.9 - 10.3 mg/dL   Total Protein 6.7 6.5 - 8.1 g/dL   Albumin 2.4 (L) 3.5 - 5.0  g/dL   AST 29 15 - 41 U/L   ALT 25 17 - 63 U/L   Alkaline Phosphatase 78 38 - 126 U/L   Total Bilirubin 0.6 0.3 - 1.2 mg/dL   GFR calc non Af Amer 7 (L) >60 mL/min   GFR calc Af Amer 8 (L) >60 mL/min    Comment: (NOTE) The eGFR has been calculated using the CKD EPI equation. This calculation has not been validated in all clinical situations. eGFR's persistently <60 mL/min signify possible Chronic Kidney Disease.    Anion gap 10 5 - 15  CBC     Status: Abnormal   Collection Time: 08/27/14  7:10 PM  Result Value Ref Range   WBC 8.9 4.0 - 10.5 K/uL   RBC 3.67 (L) 4.22 - 5.81 MIL/uL   Hemoglobin 10.5 (L) 13.0 - 17.0 g/dL   HCT 32.2 (L) 39.0 - 52.0 %   MCV 87.7 78.0 - 100.0 fL   MCH 28.6 26.0 - 34.0 pg   MCHC 32.6 30.0 - 36.0 g/dL   RDW 15.1 11.5 - 15.5 %   Platelets 114 (L) 150 - 400 K/uL    Comment: REPEATED TO VERIFY SPECIMEN CHECKED  FOR CLOTS PLATELET COUNT CONFIRMED BY SMEAR   I-Stat CG4 Lactic Acid, ED     Status: None   Collection Time: 08/27/14  7:10 PM  Result Value Ref Range   Lactic Acid, Venous 0.61 0.5 - 2.0 mmol/L  Urinalysis, Routine w reflex microscopic (not at [facility])     Status: Abnormal   Collection Time: 08/27/14  8:50 PM  Result Value Ref Range   Color, Urine YELLOW YELLOW   APPearance TURBID (A) CLEAR   Specific Gravity, Urine 1.016 1.005 - 1.030   pH 5.0 5.0 - 8.0   Glucose, UA 100 (A) NEGATIVE mg/dL   Hgb urine dipstick MODERATE (A) NEGATIVE   Bilirubin Urine NEGATIVE NEGATIVE   Ketones, ur NEGATIVE NEGATIVE mg/dL   Protein, ur >300 (A) NEGATIVE mg/dL   Urobilinogen, UA 0.2 0.0 - 1.0 mg/dL   Nitrite NEGATIVE NEGATIVE   Leukocytes, UA NEGATIVE NEGATIVE  Urine culture     Status: None (Preliminary result)   Collection Time: 08/27/14  8:50 PM  Result Value Ref Range   Specimen Description URINE, CATHETERIZED    Special Requests NONE    Culture NO GROWTH < 24 HOURS    Report Status PENDING   Urine microscopic-add on     Status: None   Collection  Time: 08/27/14  8:50 PM  Result Value Ref Range   WBC, UA 0-2 <3 WBC/hpf   RBC / HPF 0-2 <3 RBC/hpf   Bacteria, UA RARE RARE   Urine-Other AMORPHOUS URATES/PHOSPHATES   I-Stat CG4 Lactic Acid, ED  (not at  [facility])     Status: None   Collection Time: 08/27/14 10:14 PM  Result Value Ref Range   Lactic Acid, Venous 0.76 0.5 - 2.0 mmol/L  CBC WITH DIFFERENTIAL     Status: Abnormal   Collection Time: 08/28/14  4:50 AM  Result Value Ref Range   WBC 9.2 4.0 - 10.5 K/uL    Comment: WHITE COUNT CONFIRMED ON SMEAR   RBC 3.62 (L) 4.22 - 5.81 MIL/uL   Hemoglobin 10.5 (L) 13.0 - 17.0 g/dL   HCT 31.5 (L) 39.0 - 52.0 %   MCV 87.0 78.0 - 100.0 fL   MCH 29.0 26.0 - 34.0 pg   MCHC 33.3 30.0 - 36.0 g/dL   RDW 14.9 11.5 - 15.5 %   Platelets 113 (L) 150 - 400 K/uL    Comment: PLATELET COUNT CONFIRMED BY SMEAR   Neutrophils Relative % 75 43 - 77 %   Lymphocytes Relative 17 12 - 46 %   Monocytes Relative 7 3 - 12 %   Eosinophils Relative 1 0 - 5 %   Basophils Relative 0 0 - 1 %   Neutro Abs 6.9 1.7 - 7.7 K/uL   Lymphs Abs 1.6 0.7 - 4.0 K/uL   Monocytes Absolute 0.6 0.1 - 1.0 K/uL   Eosinophils Absolute 0.1 0.0 - 0.7 K/uL   Basophils Absolute 0.0 0.0 - 0.1 K/uL   RBC Morphology POLYCHROMASIA PRESENT   Comprehensive metabolic panel     Status: Abnormal   Collection Time: 08/28/14  4:50 AM  Result Value Ref Range   Sodium 138 135 - 145 mmol/L   Potassium 4.0 3.5 - 5.1 mmol/L   Chloride 118 (H) 101 - 111 mmol/L   CO2 15 (L) 22 - 32 mmol/L   Glucose, Bld 150 (H) 65 - 99 mg/dL   BUN 87 (H) 6 - 20 mg/dL   Creatinine, Ser 7.94 (H) 0.61 - 1.24 mg/dL  Calcium 8.2 (L) 8.9 - 10.3 mg/dL   Total Protein 6.8 6.5 - 8.1 g/dL   Albumin 2.2 (L) 3.5 - 5.0 g/dL   AST 26 15 - 41 U/L   ALT 24 17 - 63 U/L   Alkaline Phosphatase 72 38 - 126 U/L   Total Bilirubin 0.8 0.3 - 1.2 mg/dL   GFR calc non Af Amer 7 (L) >60 mL/min   GFR calc Af Amer 8 (L) >60 mL/min    Comment: (NOTE) The eGFR has been calculated using  the CKD EPI equation. This calculation has not been validated in all clinical situations. eGFR's persistently <60 mL/min signify possible Chronic Kidney Disease.    Anion gap 5 5 - 15  Protime-INR     Status: Abnormal   Collection Time: 08/28/14  4:50 AM  Result Value Ref Range   Prothrombin Time 21.1 (H) 11.6 - 15.2 seconds   INR 1.83 (H) 0.00 - 1.49  Urinalysis, Routine w reflex microscopic (not at [facility])     Status: Abnormal   Collection Time: 08/28/14  8:34 AM  Result Value Ref Range   Color, Urine YELLOW YELLOW   APPearance CLEAR CLEAR   Specific Gravity, Urine 1.014 1.005 - 1.030   pH 5.0 5.0 - 8.0   Glucose, UA 100 (A) NEGATIVE mg/dL   Hgb urine dipstick SMALL (A) NEGATIVE   Bilirubin Urine NEGATIVE NEGATIVE   Ketones, ur NEGATIVE NEGATIVE mg/dL   Protein, ur >300 (A) NEGATIVE mg/dL   Urobilinogen, UA 0.2 0.0 - 1.0 mg/dL   Nitrite NEGATIVE NEGATIVE   Leukocytes, UA NEGATIVE NEGATIVE  Urine microscopic-add on     Status: Abnormal   Collection Time: 08/28/14  8:34 AM  Result Value Ref Range   Squamous Epithelial / LPF RARE RARE   WBC, UA 3-6 <3 WBC/hpf   RBC / HPF 0-2 <3 RBC/hpf   Bacteria, UA FEW (A) RARE   Casts GRANULAR CAST (A) NEGATIVE    Dg Chest Port 1 View  08/27/2014   CLINICAL DATA:  Heat exposure, lightheadedness  EXAM: PORTABLE CHEST - 1 VIEW  COMPARISON:  January 17, 2014  FINDINGS: The heart size and mediastinal contours are stable. The heart size is enlarged. There is a right pleural effusion with consolidation of right lung base chronic compared prior exam. The left lung is clear. There is no pulmonary edema. The visualized skeletal structures are unremarkable.  IMPRESSION: Chronic right pleural effusion with consolidation of right lung base unchanged. Cardiomegaly.   Electronically Signed   By: Abelardo Diesel M.D.   On: 08/27/2014 19:30   ROS General: Denies fever, chills, fatigue  Respiratory: Denies SOB, cough Cardiovascular: Denies chest pain and  palpitations.  Gastrointestinal: Admits to diarrhea. Denies nausea, vomiting, abdominal pain Genitourinary: Denies hesitancy, dysuria, urgency, frequency, hematuria, suprapubic pain and flank pain. Skin: Denies pallor, rash and wounds.  Neurological: Denies dizziness, headaches, weakness, lightheadedness   Physical Exam Filed Vitals:   08/27/14 2323 08/28/14 0013 08/28/14 0456 08/28/14 1000  BP:  179/68 181/70 177/72  Pulse:  64 63 64  Temp: 98.4 F (36.9 C) 97.5 F (36.4 C) 96.6 F (35.9 C) 98.2 F (36.8 C)  TempSrc: Oral Oral Oral Oral  Resp:  _0 Height:  _1  (1.702 m)    Weight:  110 lb 7.2 oz (50.1 kg)    SpO2:  91% 95% 96%   General: Vital signs reviewed.  Patient is well-developed and well-nourished, in no acute distress and cooperative  with exam.  Cardiovascular: RRR, S1 normal, S2 normal Pulmonary/Chest: Clear to auscultation bilaterally, no wheezes, rales, or rhonchi. Abdominal: Soft, non-tender, non-distended, BS +, no guarding present.  Musculoskeletal: No joint deformities, erythema, or stiffness, ROM full and nontender. Extremities: No lower extremity edema bilaterally Neurological: A&O x3 Psychiatric: Normal mood and affect. speech and behavior is normal. Cognition and memory are normal.   Assessment/Plan:  Acute on Chronic Renal Failure: Possibly secondary to volume depletion given underlying diarrheal illness. Will proceed with FENa. Baseline creatinine appears to be around 4, with GFR of 18. On presentation, creatinine was 7.86> 7.94 this morning. UOP -300 this morning (none recorded yesterday). Patient has recent HD fistula placed (?one year ago per patient). We will continue watch and monitor off of HD for now.  -Continue foley -Trend RFP daily -Strict I/Os -Urine sodium -Urine creatinine -Sodium bicarb infusion 75 cc/hr (patient has good EF of 55-60% as of 08/2013)  Non-gapped Metabolic Acidosis: Likely secondary to RTA from CKD. Bicarb 15 this  morning. -Sodium bicarb infusion 75 cc/hr (patient has good EF of 55-60% as of 08/2013)  Osa Craver, DO PGY-2 Internal Medicine Resident Pager # [phone] 08/28/2014 1:01 PM

## 2014-08-28 NOTE — Progress Notes (Signed)
PROGRESS NOTE  [name] [name] EXB:[id number] DOB: [birth year] DOA: 08/27/2014 PCP: Lonia Blood, MD  HPI: 59 year old male with HTN, CHF, seizures, Type 2 diabetes mellitus, Hepatitis C, and Stage 4 CKD who had a fistula placed in anticipation of future hemodialysis was brought to the ED via EMS for a fever and mildly decreased mentation per family members. According to family, patient was sitting outside and "did not look well". He was febrile and was not acting himself. He has been having diarrhea for the past month and has not taken anything for it. He has not been on any recent antibiotics. The patient denies nausea, vomiting, abdominal pain, shortness of breath and chest pain. He was treated in 12/2013 for C.diff. He was admitted to inpatient on 08/27/14.  Subjective / 24 H Interval events Patient reports feeling better than upon admission. He has had 1 episode of diarrhea this morning. He denies nausea, vomiting, abdominal pain, chest pain, difficulty breathing and lower extremity edema.  Assessment/Plan: Principal Problem:   Fever Active Problems:   Hypertension   H/O: CVA (cerebrovascular accident)   End stage renal disease   DM (diabetes mellitus), type 2 with renal complications   Anemia of renal disease   Diarrhea   Sepsis due to C diff colitis - C. difficile tested positive, he has recurrent episode - We'll start by mouth vancomycin taper - Tmax of 103.4 upon admission - Started on IV vanc/zosyn 08/27/14. These were discontinued on 08/27/14 due to stool being C.Diff positive.  - Started on PO vanc for C. Diff - CXR negative for pneumonia, UA negative for infection - Blood cultures, GI path panel, urine culture pending - Renal function panel pending  - Tylenol PRN for fever  Acute on Chronic Kidney Disease, Stage 4-5 - Possibly due to dehydration from fluid loss due to persistent diarrhea  - Fistula placed for future HD, is followed by nephrology, per family patient is  urinating twice/day.  - Baseline in 12/2013 BUN 45 and Cr 4.10 - Consulted nephrology - will monitor off of HD for now, sodium bicarb infusion 75cc/hr due to bicarb at 15. Likely RTA from CKD - Monitor I/Os, urine Na, urine creatinine - Monitor H/H - currently stable 10.5/31.5  Hypertension - On amlodipine, carvedilol  Type 2 Diabetes Mellitus - Insulin dependent, SSI, meal coverage - Monitor CBG - Carb modified diet  CHF - Currently stable - Echo on 09/12/13: LVEF 55-60% - On lasix and imdur  Hyperlipidemia - On atorvastatin  History of CVA - On aspirin  - On warfarin per pharmacy, per patient has not been taking - INR 1.83 PTT 21.1   Diet: Diet renal/carb modified with fluid restriction Diet-HS Snack?: Nothing; Room service appropriate?: Yes; Fluid consistency:: Thin Fluids: IVF DVT Prophylaxis: On warfarin  Code Status: Full Code Family Communication: Patient Disposition Plan: Remain inpatient  Consultants:  Nephrology   Antibiotics  IV Vanc 08/27/14 >> 08/28/14  Zosyn 08/27/14 >> 08/28/14  PO Vanc 08/28/14 >>   Studies  Dg Chest Port 1 View  08/27/2014   CLINICAL DATA:  Heat exposure, lightheadedness  EXAM: PORTABLE CHEST - 1 VIEW  COMPARISON:  January 17, 2014  FINDINGS: The heart size and mediastinal contours are stable. The heart size is enlarged. There is a right pleural effusion with consolidation of right lung base chronic compared prior exam. The left lung is clear. There is no pulmonary edema. The visualized skeletal structures are unremarkable.  IMPRESSION: Chronic right pleural effusion with consolidation of right lung  base unchanged. Cardiomegaly.   Electronically Signed   By: Sherian Rein M.D.   On: 08/27/2014 19:30   Objective  Filed Vitals:   08/27/14 2323 08/28/14 0013 08/28/14 0456 08/28/14 1000  BP:  179/68 181/70 177/72  Pulse:  64 63 64  Temp: 98.4 F (36.9 C) 97.5 F (36.4 C) 96.6 F (35.9 C) 98.2 F (36.8 C)  TempSrc: Oral Oral  Oral Oral  Resp:  20 18 18   Height:  5\' 7"  (1.702 m)    Weight:  50.1 kg (110 lb 7.2 oz)    SpO2:  91% 95% 96%    Intake/Output Summary (Last 24 hours) at 08/28/14 1344 Last data filed at 08/28/14 1300  Gross per 24 hour  Intake    700 ml  Output    302 ml  Net    398 ml   Filed Weights   08/28/14 0013  Weight: 50.1 kg (110 lb 7.2 oz)   Exam:  General:  Patient sitting on chair in no acute distress, appears mal-nourished, is pleasant and interacts appropriately  HEENT: no scleral icterus  Cardiovascular: RRR without MRG, 2+ peripheral pulses, no edema  Respiratory: CTA biL, good air movement, no wheezing, no crackles, no rales  Abdomen: BS +, soft, non tender, no guarding  MSK/Extremities: no clubbing/cyanosis, no joint swelling  Skin: no rashes  Neuro: non focal  Data Reviewed: Basic Metabolic Panel:  Recent Labs Lab 08/27/14 1910 08/28/14 0450  NA 138 138  K 4.3 4.0  CL 115* 118*  CO2 13* 15*  GLUCOSE 128* 150*  BUN 90* 87*  CREATININE 7.86* 7.94*  CALCIUM 8.7* 8.2*   Liver Function Tests:  Recent Labs Lab 08/27/14 1910 08/28/14 0450  AST 29 26  ALT 25 24  ALKPHOS 78 72  BILITOT 0.6 0.8  PROT 6.7 6.8  ALBUMIN 2.4* 2.2*   CBC:  Recent Labs Lab 08/27/14 1910 08/28/14 0450  WBC 8.9 9.2  NEUTROABS  --  6.9  HGB 10.5* 10.5*  HCT 32.2* 31.5*  MCV 87.7 87.0  PLT 114* 113*    CBG:  Recent Labs Lab 08/27/14 1855  GLUCAP 132*    Recent Results (from the past 240 hour(s))  Urine culture     Status: None (Preliminary result)   Collection Time: 08/27/14  8:50 PM  Result Value Ref Range Status   Specimen Description URINE, CATHETERIZED  Final   Special Requests NONE  Final   Culture NO GROWTH < 24 HOURS  Final   Report Status PENDING  Incomplete  Clostridium Difficile by PCR (not at [facility])     Status: Abnormal   Collection Time: 08/28/14 12:00 PM  Result Value Ref Range Status   C difficile by pcr POSITIVE (A) NEGATIVE Final     Comment: CRITICAL RESULT CALLED TO, READ BACK BY AND VERIFIED WITH: Tora Perches RN 13:05 08/28/14 ([email or web address])      Scheduled Meds: . amLODipine  10 mg Oral Daily  . aspirin  81 mg Oral Daily  . atorvastatin  20 mg Oral q1800  . calcitRIOL  0.25 mcg Oral Daily  . calcium acetate  667 mg Oral TID WC  . carvedilol  18.75 mg Oral BID WC  . famotidine  20 mg Oral BID  . furosemide  160 mg Oral BID  . isosorbide mononitrate  60 mg Oral Daily  . saccharomyces boulardii  250 mg Oral BID  . sevelamer carbonate  1,600 mg Oral TID WC  . sodium chloride  3 mL Intravenous Q12H  . thiamine  100 mg Oral q morning - 10a  . vancomycin  125 mg Oral QID   Followed by  . [START ON 09/11/2014] vancomycin  125 mg Oral BID   Followed by  . [START ON 09/19/2014] vancomycin  125 mg Oral Daily   Followed by  . [START ON 09/26/2014] vancomycin  125 mg Oral QODAY   Followed by  . [START ON 10/04/2014] vancomycin  125 mg Oral Q3 days  . warfarin  2 mg Oral q1800  . Warfarin - Physician Dosing Inpatient   Does not apply q1800   Continuous Infusions: .  sodium bicarbonate  infusion 1000 mL      Chanda Busing, PA-S   Time spent: 25 minutes   Pamella Pert, MD [facility] Pager [phone]. If 7 PM - 7 AM, please contact night-coverage at [email or web address], password [facility] 08/28/2014, 1:44 PM  LOS: 1 day

## 2014-08-28 NOTE — Progress Notes (Signed)
ANTICOAGULATION CONSULT NOTE - Initial Consult  Pharmacy Consult for Coumadin Indication: DVT (history)  Allergies  Allergen Reactions  . Pork-Derived Conservation officer, nature and Other (See Comments)    The patient has tolerated lovenox  . Lactose Intolerance (Gi)     Patient Measurements: Height:  (170.2 cm) Weight: 114 lb 10.2 oz (52 kg) IBW/kg (Calculated) : 66.1 Heparin Dosing Weight:   Vital Signs: Temp: 97.4 F (36.3 C) (07/29 2014) Temp Source: Oral (07/29 2014) BP: 155/74 mmHg (07/29 2014) Pulse Rate: 63 (07/29 2014)  Labs:  Recent Labs  08/27/14 1910 08/28/14 0450  HGB 10.5* 10.5*  HCT 32.2* 31.5*  PLT 114* 113*  LABPROT  --  21.1*  INR  --  1.83*  CREATININE 7.86* 7.94*    Estimated Creatinine Clearance: 7.5 mL/min (by C-G formula based on Cr of 7.94).   Medical History: Past Medical History  Diagnosis Date  . Hypertension   . CHF (congestive heart failure)   . Seizures   . Tuberculosis 1960's    "got the tx for it" (10/27/2013)  . Type II diabetes mellitus   . Hepatitis C   . Stroke 06/2013    "LLE paralyzed since" (10/27/2013)  . Arthritis     "left knee hurts" (10/27/2013)  . Chronic kidney disease (CKD), stage IV (severe)   . Hypoglycemia 11/03/2013  . Brittle diabetes   . DVT (deep venous thrombosis)   . Hx of medication noncompliance     Medications:  Scheduled:  . amLODipine  10 mg Oral Daily  . aspirin  81 mg Oral Daily  . atorvastatin  20 mg Oral q1800  . calcitRIOL  0.25 mcg Oral Daily  . calcium acetate  667 mg Oral TID WC  . carvedilol  18.75 mg Oral BID WC  . famotidine  20 mg Oral BID  . feeding supplement (NEPRO CARB STEADY)  237 mL Oral BID BM  . furosemide  160 mg Oral BID  . isosorbide mononitrate  60 mg Oral Daily  . saccharomyces boulardii  250 mg Oral BID  . sevelamer carbonate  1,600 mg Oral TID WC  . sodium chloride  3 mL Intravenous Q12H  . thiamine  100 mg Oral q morning - 10a  . vancomycin  125 mg Oral QID   Followed by  . [START ON 09/11/2014] vancomycin  125 mg Oral BID   Followed by  . [START ON 09/19/2014] vancomycin  125 mg Oral Daily   Followed by  . [START ON 09/26/2014] vancomycin  125 mg Oral QODAY   Followed by  . [START ON 10/04/2014] vancomycin  125 mg Oral Q3 days  . warfarin  2 mg Oral ONCE-1800  . [START ON 08/29/2014] Warfarin - Pharmacist Dosing Inpatient   Does not apply q1800    Assessment: 59yo male with hx of DVT, on Coumadin  daily pta.  INR 1.83 this AM.  Last dose of Coumadin on 7/28.  INR is below goal, Hg 10.5, pltc 113.  Will increase dose today.  Goal of Therapy:  INR 2-3 Monitor platelets by anticoagulation protocol: Yes   Plan:  Coumadin  po x 1 Daily INR Coumadin book and video Consider adding heparin bridge if INR does not increase to goal by 7/30  Marisue Humble, PharmD Clinical Pharmacist [facility]

## 2014-08-28 NOTE — Progress Notes (Signed)
Initial Nutrition Assessment  DOCUMENTATION CODES:   Severe malnutrition in context of chronic illness, Underweight  INTERVENTION:   Provide Nepro Shake po BID, each supplement provides 425 kcal and 19 grams protein.  Encourage adequate PO intake.   NUTRITION DIAGNOSIS:   Malnutrition related to chronic illness as evidenced by severe depletion of body fat, severe depletion of muscle mass, percent weight loss.  GOAL:   Patient will meet greater than or equal to 90% of their needs  MONITOR:   PO intake, Supplement acceptance, Weight trends, Labs, I & O's  REASON FOR ASSESSMENT:    (Low BMI)    ASSESSMENT:   59 y.o. male with a past medical history of hypertension, CHF, seizures type 2 diabetes, hep C, seizures, stage IV CKD who recently had a fistula placed anticipating future hemodialysis and who was brought to the ER via EMS due to fever and mildly decreased mentation at home per relatives.  Pt reports having a good appetite. Meal completion has been 65-100%. PTA pt reports eating well with at least 4 meals a day. Per Epic weight records, pt with a 8.3% weight loss in 1 month. Pt is unaware of reason for weight loss as he has been eating well. Pt is agreeable to Nepro Shake. RD to order. Pt was encouraged to eat his food at meals and to drink his supplements.   Nutrition-Focused physical exam completed. Findings are severe fat depletion, severe muscle depletion, and no edema.   Labs and medications reviewed.  Diet Order:  Diet renal/carb modified with fluid restriction Diet-HS Snack?: Nothing; Room service appropriate?: Yes; Fluid consistency:: Thin  Skin:  Reviewed, no issues  Last BM:  7/29  Height:   Ht Readings from Last 1 Encounters:  08/28/14  (1.702 m)    Weight:   Wt Readings from Last 1 Encounters:  08/28/14 110 lb 7.2 oz (50.1 kg)    Ideal Body Weight:  67 kg  BMI:  Body mass index is 17.29 kg/(m^2). Underweight  Estimated Nutritional Needs:    Kcal:  1850-2100  Protein:  75-90 grams  Fluid:  1.2 L/day  EDUCATION NEEDS:   No education needs identified at this time  Roslyn Smiling, MS, RD, LDN Pager # [phone] After hours/ weekend pager # [phone]

## 2014-08-28 NOTE — Progress Notes (Deleted)
ANTICOAGULATION CONSULT NOTE - Initial Consult  Pharmacy Consult for Coumadin Indication: DVT (history)  Allergies  Allergen Reactions  . Pork-Derived Conservation officer, nature and Other (See Comments)    The patient has tolerated lovenox  . Lactose Intolerance (Gi)     Patient Measurements: Height:  (170.2 cm) Weight: 110 lb 7.2 oz (50.1 kg) IBW/kg (Calculated) : 66.1 Heparin Dosing Weight:   Vital Signs: Temp: 98.2 F (36.8 C) (07/29 1000) Temp Source: Oral (07/29 1000) BP: 177/72 mmHg (07/29 1000) Pulse Rate: 64 (07/29 1000)  Labs:  Recent Labs  08/27/14 1910 08/28/14 0450  HGB 10.5* 10.5*  HCT 32.2* 31.5*  PLT 114* 113*  LABPROT  --  21.1*  INR  --  1.83*  CREATININE 7.86* 7.94*    Estimated Creatinine Clearance: 7.2 mL/min (by C-G formula based on Cr of 7.94).   Medical History: Past Medical History  Diagnosis Date  . Hypertension   . CHF (congestive heart failure)   . Seizures   . Tuberculosis 1960's    "got the tx for it" (10/27/2013)  . Type II diabetes mellitus   . Hepatitis C   . Stroke 06/2013    "LLE paralyzed since" (10/27/2013)  . Arthritis     "left knee hurts" (10/27/2013)  . Chronic kidney disease (CKD), stage IV (severe)   . Hypoglycemia 11/03/2013  . Brittle diabetes   . DVT (deep venous thrombosis)   . Hx of medication noncompliance     Medications:  Scheduled:  . amLODipine  10 mg Oral Daily  . aspirin  81 mg Oral Daily  . atorvastatin  20 mg Oral q1800  . calcitRIOL  0.25 mcg Oral Daily  . calcium acetate  667 mg Oral TID WC  . carvedilol  18.75 mg Oral BID WC  . famotidine  20 mg Oral BID  . furosemide  160 mg Oral BID  . isosorbide mononitrate  60 mg Oral Daily  . piperacillin-tazobactam (ZOSYN)  IV  2.25 g Intravenous Q8H  . saccharomyces boulardii  250 mg Oral BID  . sevelamer carbonate  1,600 mg Oral TID WC  . sodium bicarbonate  650 mg Oral QID  . sodium chloride  3 mL Intravenous Q12H  . thiamine  100 mg Oral q morning  - 10a  . warfarin  2 mg Oral q1800  . Warfarin - Physician Dosing Inpatient   Does not apply q1800    Assessment: 59yo male with hx of DVT, on Coumadin  daily pta.  INR 1.83 this AM.  Last dose of Coumadin on 7/28.  INR is below goal, Hg 10.5, pltc 113.  Will increase dose today.  Goal of Therapy:  INR 2-3 Monitor platelets by anticoagulation protocol: Yes   Plan:  Coumadin  po x 1 Daily INR Coumadin book and video Consider adding heparin bridge if INR does not increase to goal by 7/30  Marisue Humble, PharmD Clinical Pharmacist [place] [facility]

## 2014-08-28 NOTE — Progress Notes (Signed)
CRITICAL VALUE ALERT  Critical value received:  CDIFF positive  Date of notification:  08/28/2014  Time of notification:  1:06 PM  Critical value read back:Yes.    Nurse who received alert:  Theadora Rama  MD notified (1st page):  Dr. Elvera Lennox  Time of first page:  [phone]  MD notified (2nd page):  Time of second page:  Responding MD:  Dr. Elvera Lennox  Time MD responded:  [phone]

## 2014-08-29 DIAGNOSIS — L8990 Pressure ulcer of unspecified site, unspecified stage: Secondary | ICD-10-CM | POA: Insufficient documentation

## 2014-08-29 LAB — RENAL FUNCTION PANEL
ALBUMIN: 2.3 g/dL — AB (ref 3.5–5.0)
ANION GAP: 8 (ref 5–15)
BUN: 84 mg/dL — ABNORMAL HIGH (ref 6–20)
CO2: 17 mmol/L — ABNORMAL LOW (ref 22–32)
CREATININE: 7.52 mg/dL — AB (ref 0.61–1.24)
Calcium: 8.4 mg/dL — ABNORMAL LOW (ref 8.9–10.3)
Chloride: 112 mmol/L — ABNORMAL HIGH (ref 101–111)
GFR calc Af Amer: 8 mL/min — ABNORMAL LOW (ref >60)
GFR calc non Af Amer: 7 mL/min — ABNORMAL LOW (ref >60)
Glucose, Bld: 158 mg/dL — ABNORMAL HIGH (ref 65–99)
POTASSIUM: 3.8 mmol/L (ref 3.5–5.1)
Phosphorus: 3.2 mg/dL (ref 2.5–4.6)
Sodium: 137 mmol/L (ref 135–145)

## 2014-08-29 LAB — COMPREHENSIVE METABOLIC PANEL
ALK PHOS: 76 U/L (ref 38–126)
ALT: 21 U/L (ref 17–63)
ANION GAP: 8 (ref 5–15)
AST: 22 U/L (ref 15–41)
Albumin: 2.3 g/dL — ABNORMAL LOW (ref 3.5–5.0)
BILIRUBIN TOTAL: 0.6 mg/dL (ref 0.3–1.2)
BUN: 83 mg/dL — AB (ref 6–20)
CHLORIDE: 114 mmol/L — AB (ref 101–111)
CO2: 17 mmol/L — AB (ref 22–32)
CREATININE: 7.66 mg/dL — AB (ref 0.61–1.24)
Calcium: 8.4 mg/dL — ABNORMAL LOW (ref 8.9–10.3)
GFR calc Af Amer: 8 mL/min — ABNORMAL LOW (ref >60)
GFR, EST NON AFRICAN AMERICAN: 7 mL/min — AB (ref >60)
Glucose, Bld: 157 mg/dL — ABNORMAL HIGH (ref 65–99)
POTASSIUM: 3.8 mmol/L (ref 3.5–5.1)
Sodium: 139 mmol/L (ref 135–145)
Total Protein: 6.8 g/dL (ref 6.5–8.1)

## 2014-08-29 LAB — CBC WITH DIFFERENTIAL/PLATELET
BASOS ABS: 0 10*3/uL (ref 0.0–0.1)
Basophils Relative: 0 % (ref 0–1)
Eosinophils Absolute: 0.1 10*3/uL (ref 0.0–0.7)
Eosinophils Relative: 1 % (ref 0–5)
HCT: 30 % — ABNORMAL LOW (ref 39.0–52.0)
Hemoglobin: 10.1 g/dL — ABNORMAL LOW (ref 13.0–17.0)
LYMPHS PCT: 13 % (ref 12–46)
Lymphs Abs: 1 10*3/uL (ref 0.7–4.0)
MCH: 28.8 pg (ref 26.0–34.0)
MCHC: 33.7 g/dL (ref 30.0–36.0)
MCV: 85.5 fL (ref 78.0–100.0)
Monocytes Absolute: 0.4 10*3/uL (ref 0.1–1.0)
Monocytes Relative: 5 % (ref 3–12)
NEUTROS ABS: 6.8 10*3/uL (ref 1.7–7.7)
Neutrophils Relative %: 81 % — ABNORMAL HIGH (ref 43–77)
PLATELETS: 120 10*3/uL — AB (ref 150–400)
RBC: 3.51 MIL/uL — AB (ref 4.22–5.81)
RDW: 14.9 % (ref 11.5–15.5)
WBC: 8.3 10*3/uL (ref 4.0–10.5)

## 2014-08-29 LAB — URINE CULTURE: CULTURE: NO GROWTH

## 2014-08-29 LAB — PROTIME-INR
INR: 2.02 — AB (ref 0.00–1.49)
Prothrombin Time: 22.8 seconds — ABNORMAL HIGH (ref 11.6–15.2)

## 2014-08-29 LAB — VANCOMYCIN, RANDOM: Vancomycin Rm: 18 ug/mL

## 2014-08-29 MED ORDER — HYDRALAZINE HCL 10 MG PO TABS
10.0000 mg | ORAL_TABLET | Freq: Three times a day (TID) | ORAL | Status: DC
  Administered 2014-08-29 – 2014-09-02 (×12): 10 mg via ORAL
  Filled 2014-08-29 (×13): qty 1

## 2014-08-29 MED ORDER — WARFARIN SODIUM 4 MG PO TABS
4.0000 mg | ORAL_TABLET | Freq: Once | ORAL | Status: AC
  Administered 2014-08-29: 4 mg via ORAL
  Filled 2014-08-29: qty 1

## 2014-08-29 MED ORDER — FAMOTIDINE 20 MG PO TABS
20.0000 mg | ORAL_TABLET | Freq: Every day | ORAL | Status: DC
  Administered 2014-08-29 – 2014-09-01 (×4): 20 mg via ORAL
  Filled 2014-08-29 (×4): qty 1

## 2014-08-29 NOTE — Progress Notes (Signed)
PROGRESS NOTE  [name] [name] ZOX:[id number] DOB: Jun 12, 1955 DOA: 08/27/2014 PCP: Lonia Blood, MD  HPI: 59 year old male with HTN, CHF, seizures, Type 2 diabetes mellitus, Hepatitis C, and Stage 4 CKD who had a fistula placed in anticipation of future hemodialysis was brought to the ED via EMS for a fever and mildly decreased mentation per family members. According to family, patient was sitting outside and "did not look well". He was febrile and was not acting himself. He has been having diarrhea for the past month and has not taken anything for it. He has not been on any recent antibiotics. The patient denies nausea, vomiting, abdominal pain, shortness of breath and chest pain. He was treated in 12/2013 for C.diff. He was admitted to inpatient on 08/27/14.  Subjective / 24 H Interval events Patient reports feeling better than upon admission. He has had 1 episode of diarrhea this morning. He denies nausea, vomiting, abdominal pain, chest pain, difficulty breathing and lower extremity edema.  Assessment/Plan: Principal Problem:   Fever Active Problems:   Hypertension   H/O: CVA (cerebrovascular accident)   End stage renal disease   DM (diabetes mellitus), type 2 with renal complications   Anemia of renal disease   Diarrhea   Clostridium difficile colitis   Sepsis due to C diff colitis - C. difficile tested positive, he has recurrent episode - continue po vancomycin taper - no further diarrhea overnight - Started on IV vanc/zosyn 08/27/14. These were discontinued on 08/27/14 due to stool being C.Diff positive.  - CXR negative for pneumonia, UA negative for infection - Blood cultures, GI path panel, urine culture pending  Acute on Chronic Kidney Disease, Stage 4-5 - Possibly due to dehydration from fluid loss due to persistent diarrhea  - Fistula placed for future HD, is followed by nephrology, per family patient is urinating twice/day.  - Consulted nephrology - will monitor off of HD  for now - Monitor I/Os, urine Na, urine creatinine - Monitor H/H - currently stable 10.5/31.5  Hypertension - On amlodipine, carvedilol - poorly controlled still, add hydralazine  Type 2 Diabetes Mellitus - Insulin dependent, SSI, meal coverage - Monitor CBG, within acceptable range today   CHF - Currently stable - Echo on 09/12/13: LVEF 55-60% - On lasix and imdur  Hyperlipidemia - On atorvastatin  History of CVA - On aspirin  - On warfarin per pharmacy, per patient has not been taking   Diet: Diet renal/carb modified with fluid restriction Diet-HS Snack?: Nothing; Room service appropriate?: Yes; Fluid consistency:: Thin Fluids: IVF DVT Prophylaxis: On warfarin  Code Status: Full Code Family Communication: Patient Disposition Plan: Remain inpatient  Consultants:  Nephrology   Antibiotics  IV Vanc 08/27/14 >> 08/28/14  Zosyn 08/27/14 >> 08/28/14  PO Vanc 08/28/14 >>   Studies Dg Chest Port 1 View  08/27/2014   CLINICAL DATA:  Heat exposure, lightheadedness  EXAM: PORTABLE CHEST - 1 VIEW  COMPARISON:  January 17, 2014  FINDINGS: The heart size and mediastinal contours are stable. The heart size is enlarged. There is a right pleural effusion with consolidation of right lung base chronic compared prior exam. The left lung is clear. There is no pulmonary edema. The visualized skeletal structures are unremarkable.  IMPRESSION: Chronic right pleural effusion with consolidation of right lung base unchanged. Cardiomegaly.   Electronically Signed   By: Sherian Rein M.D.   On: 08/27/2014 19:30   Objective  Filed Vitals:   08/28/14 1000 08/28/14 1701 08/28/14 2014 08/29/14 0411  BP: 177/72  130/64 155/74 186/82  Pulse: 64 65 63 79  Temp: 98.2 F (36.8 C) 98.2 F (36.8 C) 97.4 F (36.3 C) 99.6 F (37.6 C)  TempSrc: Oral Oral Oral Oral  Resp: 18 18 18 18   Height:      Weight:   52 kg (114 lb 10.2 oz)   SpO2: 96% 98% 98% 97%    Intake/Output Summary (Last 24 hours)  at 08/29/14 0726 Last data filed at 08/29/14 0441  Gross per 24 hour  Intake 2851.67 ml  Output   1492 ml  Net 1359.67 ml   Filed Weights   08/28/14 0013 08/28/14 2014  Weight: 50.1 kg (110 lb 7.2 oz) 52 kg (114 lb 10.2 oz)   Exam:  General:  NAD  HEENT: no scleral icterus  Cardiovascular: RRR without MRG, 2+ peripheral pulses, no edema  Respiratory: CTA biL, good air movement, no wheezing, no crackles, no rales  Abdomen: BS +, soft, non tender, no guarding  Data Reviewed: Basic Metabolic Panel:  Recent Labs Lab 08/27/14 1910 08/28/14 0450 08/29/14 0500 08/29/14 0510  NA 138 138 137 139  K 4.3 4.0 3.8 3.8  CL 115* 118* 112* 114*  CO2 13* 15* 17* 17*  GLUCOSE 128* 150* 158* 157*  BUN 90* 87* 84* 83*  CREATININE 7.86* 7.94* 7.52* 7.66*  CALCIUM 8.7* 8.2* 8.4* 8.4*  PHOS  --   --  3.2  --    Liver Function Tests:  Recent Labs Lab 08/27/14 1910 08/28/14 0450 08/29/14 0500 08/29/14 0510  AST 29 26  --  22  ALT 25 24  --  21  ALKPHOS 78 72  --  76  BILITOT 0.6 0.8  --  0.6  PROT 6.7 6.8  --  6.8  ALBUMIN 2.4* 2.2* 2.3* 2.3*   CBC:  Recent Labs Lab 08/27/14 1910 08/28/14 0450 08/29/14 0510  WBC 8.9 9.2 8.3  NEUTROABS  --  6.9 6.8  HGB 10.5* 10.5* 10.1*  HCT 32.2* 31.5* 30.0*  MCV 87.7 87.0 85.5  PLT 114* 113* 120*    CBG:  Recent Labs Lab 08/27/14 1855  GLUCAP 132*    Recent Results (from the past 240 hour(s))  Blood Culture (routine x 2)     Status: None (Preliminary result)   Collection Time: 08/27/14  8:07 PM  Result Value Ref Range Status   Specimen Description BLOOD RIGHT HAND  Final   Special Requests BOTTLES DRAWN AEROBIC AND ANAEROBIC  Final   Culture NO GROWTH < 24 HOURS  Final   Report Status PENDING  Incomplete  Blood Culture (routine x 2)     Status: None (Preliminary result)   Collection Time: 08/27/14  8:37 PM  Result Value Ref Range Status   Specimen Description BLOOD RIGHT ARM  Final   Special Requests BOTTLES  DRAWN AEROBIC AND ANAEROBIC  Final   Culture NO GROWTH < 24 HOURS  Final   Report Status PENDING  Incomplete  Urine culture     Status: None (Preliminary result)   Collection Time: 08/27/14  8:50 PM  Result Value Ref Range Status   Specimen Description URINE, CATHETERIZED  Final   Special Requests NONE  Final   Culture NO GROWTH < 24 HOURS  Final   Report Status PENDING  Incomplete  Clostridium Difficile by PCR (not at [facility])     Status: Abnormal   Collection Time: 08/28/14 12:00 PM  Result Value Ref Range Status   C difficile by pcr POSITIVE (  A) NEGATIVE Final    Comment: CRITICAL RESULT CALLED TO, READ BACK BY AND VERIFIED WITH: Tora Perches RN 13:05 08/28/14 ([email or web address])      Scheduled Meds: . amLODipine  10 mg Oral Daily  . aspirin  81 mg Oral Daily  . atorvastatin  20 mg Oral q1800  . calcitRIOL  0.25 mcg Oral Daily  . calcium acetate  667 mg Oral TID WC  . carvedilol  18.75 mg Oral BID WC  . famotidine  20 mg Oral BID  . feeding supplement (NEPRO CARB STEADY)  237 mL Oral BID BM  . furosemide  160 mg Oral BID  . isosorbide mononitrate  60 mg Oral Daily  . saccharomyces boulardii  250 mg Oral BID  . sevelamer carbonate  1,600 mg Oral TID WC  . sodium chloride  3 mL Intravenous Q12H  . thiamine  100 mg Oral q morning - 10a  . vancomycin  125 mg Oral QID   Followed by  . [START ON 09/11/2014] vancomycin  125 mg Oral BID   Followed by  . [START ON 09/19/2014] vancomycin  125 mg Oral Daily   Followed by  . [START ON 09/26/2014] vancomycin  125 mg Oral QODAY   Followed by  . [START ON 10/04/2014] vancomycin  125 mg Oral Q3 days  . Warfarin - Pharmacist Dosing Inpatient   Does not apply q1800   Continuous Infusions: .  sodium bicarbonate  infusion 1000 mL 75 mL/hr at 08/29/14 0609    Pamella Pert, MD [facility] Pager [phone]. If 7 PM - 7 AM, please contact night-coverage at [email or web address], password [facility] 08/29/2014, 7:26 AM  LOS: 2 days

## 2014-08-29 NOTE — Progress Notes (Signed)
ANTICOAGULATION CONSULT NOTE - Follow-up  Pharmacy Consult for Coumadin Indication: DVT (history)  Allergies  Allergen Reactions  . Pork-Derived Conservation officer, nature and Other (See Comments)    The patient has tolerated lovenox  . Lactose Intolerance (Gi)     Patient Measurements: Height:  (170.2 cm) Weight: 114 lb 10.2 oz (52 kg) IBW/kg (Calculated) : 66.1   Vital Signs: Temp: 100.2 F (37.9 C) (07/30 1000) Temp Source: Oral (07/30 1000) BP: 182/81 mmHg (07/30 1000) Pulse Rate: 79 (07/30 1000)  Labs:  Recent Labs  08/27/14 1910 08/28/14 0450 08/29/14 0500 08/29/14 0510  HGB 10.5* 10.5*  --  10.1*  HCT 32.2* 31.5*  --  30.0*  PLT 114* 113*  --  120*  LABPROT  --  21.1*  --  22.8*  INR  --  1.83*  --  2.02*  CREATININE 7.86* 7.94* 7.52* 7.66*    Estimated Creatinine Clearance: 7.7 mL/min (by C-G formula based on Cr of 7.66).  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 INR is now therapeutic at 2.02. CBC is stable and no bleeding noted.   Goal of Therapy:  INR 2-3   Plan:  - Repeat warfarin  PO x 1 tonight - Daily INR  Lysle Pearl, PharmD, BCPS Pager # [phone] 08/29/2014 11:35 AM

## 2014-08-29 NOTE — Progress Notes (Signed)
Subjective: Mr. Vossler is a 59 yo male with PMHx of CKD stage IV who presented on 7/28 with sepsis secondary to C. Diff Colitis. Patient was also found to have acute on chronic renal failure likely secondary to infection and volume depletion. Patient was seen and examined this morning. He complains of fatigue, but denies shortness of breath, confusion, itching.   Objective: Filed Vitals:   08/28/14 1701 08/28/14 2014 08/29/14 0411 08/29/14 1000  BP: 130/64 155/74 186/82 182/81  Pulse: 65 63 79 79  Temp: 98.2 F (36.8 C) 97.4 F (36.3 C) 99.6 F (37.6 C) 100.2 F (37.9 C)  TempSrc: Oral Oral Oral Oral  Resp: 18 18 18 18   Height:      Weight:  114 lb 10.2 oz (52 kg)    SpO2: 98% 98% 97% 98%   General: Vital signs reviewed. Patient is chronically ill appearing, in no acute distress and cooperative with exam.  Cardiovascular: RRR, S1 normal, S2 normal Pulmonary/Chest: Clear to auscultation bilaterally, no wheezes, rales, or rhonchi. Abdominal: Soft, non-tender, non-distended, hyperactive BS, no guarding present.  Extremities: No lower extremity edema bilaterally. LUE AVF with good thrill and bruit. Neurological: A&O x3 Psychiatric: Normal mood and affect. speech and behavior is normal. Cognition and memory are normal.   Intake/Output from previous day: 07/29 0701 - 07/30 0700 In: 2851.7 [P.O.:1200; I.V.:1651.7] Out: 1492 [Urine:1490; Stool:2] Intake/Output this shift: Total I/O In: 240 [P.O.:240] Out: 150 [Urine:150]  Lab Results:  Recent Labs  08/28/14 0450 08/29/14 0510  WBC 9.2 8.3  HGB 10.5* 10.1*  HCT 31.5* 30.0*  PLT 113* 120*   BMET:   Recent Labs  08/29/14 0500 08/29/14 0510  NA 137 139  K 3.8 3.8  CL 112* 114*  CO2 17* 17*  GLUCOSE 158* 157*  BUN 84* 83*  CREATININE 7.52* 7.66*  CALCIUM 8.4* 8.4*   CBG (last 3)   Recent Labs  08/27/14 1855  GLUCAP 132*     Studies/Results: Dg Chest Port 1 View  08/27/2014   CLINICAL DATA:  Heat  exposure, lightheadedness  EXAM: PORTABLE CHEST - 1 VIEW  COMPARISON:  January 17, 2014  FINDINGS: The heart size and mediastinal contours are stable. The heart size is enlarged. There is a right pleural effusion with consolidation of right lung base chronic compared prior exam. The left lung is clear. There is no pulmonary edema. The visualized skeletal structures are unremarkable.  IMPRESSION: Chronic right pleural effusion with consolidation of right lung base unchanged. Cardiomegaly.   Electronically Signed   By: Sherian Rein M.D.   On: 08/27/2014 19:30   I have reviewed the patient's current medications. Prior to Admission:  Prescriptions prior to admission  Medication Sig Dispense Refill Last Dose  . amLODipine (NORVASC) 10 MG tablet Take 10 mg by mouth daily.   08/27/2014 at Unknown time  . aspirin 81 MG tablet Take 81 mg by mouth daily.   08/26/2014 at Unknown time  . atorvastatin (LIPITOR) 20 MG tablet Take 1 tablet (20 mg total) by mouth daily at 6 PM. 30 tablet 0 08/26/2014 at Unknown time  . calcitRIOL (ROCALTROL) 0.25 MCG capsule Take 1 capsule (0.25 mcg total) by mouth daily. 30 capsule 0 08/27/2014 at Unknown time  . calcium acetate (PHOSLO) 667 MG capsule Take 667 mg by mouth 3 (three) times daily with meals.   08/27/2014 at Unknown time  . carvedilol (COREG) 6.25 MG tablet Take 3 tablets (18.75 mg total) by mouth 2 (two) times daily with  a meal. 180 tablet 0 08/27/2014 at 0800  . furosemide (LASIX) 80 MG tablet Take 2 tablets (160 mg total) by mouth 2 (two) times daily. 30 tablet 0 08/27/2014 at Unknown time  . insulin aspart (NOVOLOG) 100 UNIT/ML injection Inject 0-12 Units into the skin 3 (three) times daily with meals. SSI  BS  150-200  2 u    To be given SQ                 201-250  4 u                 251-300  6 u                 301-350  8 u                  351-400  10 u                   > 400  12 u and call MD   Past Week at Unknown time  . insulin glargine (LANTUS) 100 UNIT/ML  injection Inject 0.05 mLs (5 Units total) into the skin at bedtime. 10 mL 11 08/26/2014 at Unknown time  . isosorbide mononitrate (IMDUR) 60 MG 24 hr tablet Take 1 tablet (60 mg total) by mouth daily. 30 tablet 0 08/27/2014 at Unknown time  . ranitidine (ZANTAC) 150 MG tablet Take 150 mg by mouth daily.   08/27/2014 at Unknown time  . saccharomyces boulardii (FLORASTOR) 250 MG capsule Take 1 capsule (250 mg total) by mouth 2 (two) times daily.   08/27/2014 at Unknown time  . sevelamer carbonate (RENVELA) 800 MG tablet Take 1,600 mg by mouth 3 (three) times daily with meals.   08/27/2014 at Unknown time  . sodium bicarbonate 650 MG tablet Take 2 tablets (1,300 mg total) by mouth 2 (two) times daily. (Patient taking differently: Take 650 mg by mouth 4 (four) times daily. ) 120 tablet 1 08/27/2014 at Unknown time  . thiamine (VITAMIN B-1) 100 MG tablet Take 100 mg by mouth every morning.   08/27/2014 at Unknown time  . warfarin (COUMADIN) 2 MG tablet Take 2 mg by mouth daily.   08/27/2014 at 1800  . acetaminophen (TYLENOL) 325 MG tablet Take 2 tablets (650 mg total) by mouth every 6 (six) hours as needed for mild pain (or Fever >/= 101). (Patient not taking: Reported on 01/14/2014)   Not Taking at Unknown time  . enoxaparin (LOVENOX) 60 MG/0.6ML injection Inject 0.55 mLs (55 mg total) into the skin daily. To be taking daily until INR therapeutic X 2 days (Patient not taking: Reported on 08/27/2014) 0 Syringe  Not Taking at Unknown time  . glucose blood test strip Use as instructed 100 each 12 Taking  . Nutritional Supplements (FEEDING SUPPLEMENT, NEPRO CARB STEADY,) LIQD Take 237 mLs by mouth 3 (three) times daily between meals. (Patient not taking: Reported on 08/27/2014) 90 Can 0 Not Taking at Unknown time  . vancomycin (VANCOCIN) 50 mg/mL oral solution Take 2.5 mLs (125 mg total) by mouth every 6 (six) hours. (Patient not taking: Reported on 08/27/2014)   Not Taking at Unknown time  . warfarin (COUMADIN) 5 MG  tablet Take 1 tablet (5 mg total) by mouth daily. (Patient not taking: Reported on 08/27/2014)   Not Taking at Unknown time   Scheduled: . amLODipine  10 mg Oral Daily  . aspirin  81 mg Oral Daily  . atorvastatin  20  mg Oral q1800  . calcitRIOL  0.25 mcg Oral Daily  . calcium acetate  667 mg Oral TID WC  . carvedilol  18.75 mg Oral BID WC  . famotidine  20 mg Oral QHS  . feeding supplement (NEPRO CARB STEADY)  237 mL Oral BID BM  . furosemide  160 mg Oral BID  . isosorbide mononitrate  60 mg Oral Daily  . saccharomyces boulardii  250 mg Oral BID  . sevelamer carbonate  1,600 mg Oral TID WC  . sodium chloride  3 mL Intravenous Q12H  . thiamine  100 mg Oral q morning - 10a  . vancomycin  125 mg Oral QID   Followed by  . [START ON 09/11/2014] vancomycin  125 mg Oral BID   Followed by  . [START ON 09/19/2014] vancomycin  125 mg Oral Daily   Followed by  . [START ON 09/26/2014] vancomycin  125 mg Oral QODAY   Followed by  . [START ON 10/04/2014] vancomycin  125 mg Oral Q3 days  . warfarin  4 mg Oral ONCE-1800  . Warfarin - Pharmacist Dosing Inpatient   Does not apply q1800   Continuous: .  sodium bicarbonate  infusion 1000 mL 75 mL/hr at 08/29/14 0609   WUJ:[id number] **OR** acetaminophen, ondansetron **OR** ondansetron (ZOFRAN) IV  Assessment/Plan:  Acute on Chronic Renal Failure: Possibly secondary to volume depletion and infection given underlying sepsis secondary to C. Difficile. FENa indicated intrinsic renal cause at 1.6%. Baseline creatinine appears to be around 4, with GFR of 18. Creatinine has trended 7.86> 7.94>7.66 this morning. Adequate UOP with -1490 yesterday. Patient has HD fistula placed in anticipation of starting HD. We will continue to monitor patient as there are no emergent needs for HD at this time.  -Continue foley -Trend RFP daily -Strict I/Os -Sodium bicarb infusion 75 cc/hr (patient has good EF of 55-60% as of 08/2013)  Non-gapped Metabolic Acidosis: Likely  secondary to RTA from CKD. Bicarb 15>17 this morning. -Sodium bicarb infusion 75 cc/hr   Sepsis Secondary to C. Diff Colitis: C. Diff positive in setting of diarrheal illness.  -On vancomycin po   LOS: 2 days   Jill Alexanders, DO PGY-2 Internal Medicine Resident Pager # [phone] 08/29/2014 11:38 AM

## 2014-08-30 LAB — COMPREHENSIVE METABOLIC PANEL
ALK PHOS: 72 U/L (ref 38–126)
ALT: 19 U/L (ref 17–63)
AST: 22 U/L (ref 15–41)
Albumin: 2.2 g/dL — ABNORMAL LOW (ref 3.5–5.0)
Anion gap: 11 (ref 5–15)
BUN: 82 mg/dL — ABNORMAL HIGH (ref 6–20)
CALCIUM: 8.6 mg/dL — AB (ref 8.9–10.3)
CO2: 18 mmol/L — AB (ref 22–32)
Chloride: 109 mmol/L (ref 101–111)
Creatinine, Ser: 7.88 mg/dL — ABNORMAL HIGH (ref 0.61–1.24)
GFR, EST AFRICAN AMERICAN: 8 mL/min — AB (ref >60)
GFR, EST NON AFRICAN AMERICAN: 7 mL/min — AB (ref >60)
Glucose, Bld: 130 mg/dL — ABNORMAL HIGH (ref 65–99)
POTASSIUM: 3.6 mmol/L (ref 3.5–5.1)
SODIUM: 138 mmol/L (ref 135–145)
TOTAL PROTEIN: 7.1 g/dL (ref 6.5–8.1)
Total Bilirubin: 0.7 mg/dL (ref 0.3–1.2)

## 2014-08-30 LAB — CBC WITH DIFFERENTIAL/PLATELET
BASOS ABS: 0 10*3/uL (ref 0.0–0.1)
Basophils Relative: 0 % (ref 0–1)
EOS ABS: 0.1 10*3/uL (ref 0.0–0.7)
Eosinophils Relative: 1 % (ref 0–5)
HCT: 31.2 % — ABNORMAL LOW (ref 39.0–52.0)
HEMOGLOBIN: 10.6 g/dL — AB (ref 13.0–17.0)
Lymphocytes Relative: 14 % (ref 12–46)
Lymphs Abs: 1.3 10*3/uL (ref 0.7–4.0)
MCH: 29.4 pg (ref 26.0–34.0)
MCHC: 34 g/dL (ref 30.0–36.0)
MCV: 86.4 fL (ref 78.0–100.0)
MONO ABS: 0.8 10*3/uL (ref 0.1–1.0)
MONOS PCT: 8 % (ref 3–12)
Neutro Abs: 7.4 10*3/uL (ref 1.7–7.7)
Neutrophils Relative %: 77 % (ref 43–77)
Platelets: 125 10*3/uL — ABNORMAL LOW (ref 150–400)
RBC: 3.61 MIL/uL — ABNORMAL LOW (ref 4.22–5.81)
RDW: 14.8 % (ref 11.5–15.5)
WBC: 9.6 10*3/uL (ref 4.0–10.5)

## 2014-08-30 LAB — PROTIME-INR
INR: 2.7 — ABNORMAL HIGH (ref 0.00–1.49)
PROTHROMBIN TIME: 28.3 s — AB (ref 11.6–15.2)

## 2014-08-30 LAB — PHOSPHORUS: Phosphorus: 3.5 mg/dL (ref 2.5–4.6)

## 2014-08-30 MED ORDER — WARFARIN SODIUM 1 MG PO TABS
1.0000 mg | ORAL_TABLET | Freq: Once | ORAL | Status: AC
Start: 2014-08-30 — End: 2014-08-30
  Administered 2014-08-30: 1 mg via ORAL
  Filled 2014-08-30: qty 1

## 2014-08-30 NOTE — Progress Notes (Signed)
Subjective: [name] is a 59 yo male with PMHx of CKD stage IV who presented on 7/28 with sepsis secondary to C. Diff Colitis. Patient was also found to have acute on chronic renal failure likely secondary to infection and volume depletion. Patient was seen and examined this morning. He continues to have diarrhea, but thinks it may be slightly improved. He denies any confusion, shortness of breath, nausea or vomiting.   Objective: Filed Vitals:   08/29/14 1000 08/29/14 2203 08/30/14 0618 08/30/14 1034  BP: 182/81 182/89 157/65 142/77  Pulse: 79 84 77 75  Temp: 100.2 F (37.9 C) 99.1 F (37.3 C) 98.1 F (36.7 C) 97.5 F (36.4 C)  TempSrc: Oral Oral Oral Oral  Resp: Height:      Weight:   110 lb 9.6 oz (50.168 kg)   SpO2: 98% 92% 94% 100%   General: Vital signs reviewed. Patient is chronically ill appearing, in no acute distress and cooperative with exam.  Cardiovascular: RRR, S1 normal, S2 normal Pulmonary/Chest: Clear to auscultation bilaterally, no wheezes, rales, or rhonchi. Abdominal: Soft, non-tender, non-distended, hyperactive BS, no guarding present.  Extremities: No lower extremity edema bilaterally. LUE AVF with good thrill and bruit. Neurological: A&O x3 Psychiatric: Normal mood and affect. speech and behavior is normal. Cognition and memory are normal.   Intake/Output from previous day: 07/30 0701 - 07/31 0700 In: 2325 [P.O.:600; I.V.:1725] Out: 620 [Urine:620] Intake/Output this shift: Total I/O In: 300 [P.O.:300] Out: -   Lab Results:  Recent Labs  08/29/14 0510 08/30/14 0556  WBC 8.3 9.6  HGB 10.1* 10.6*  HCT 30.0* 31.2*  PLT 120* 125*   BMET:   Recent Labs  08/29/14 0510 08/30/14 0556  NA 139 138  K 3.8 3.6  CL 114* 109  CO2 17* 18*  GLUCOSE 157* 130*  BUN 83* 82*  CREATININE 7.66* 7.88*  CALCIUM 8.4* 8.6*   CBG (last 3)   Recent Labs  08/27/14 1855  GLUCAP 132*   Studies/Results: No results found. I have reviewed  the patient's current medications. Prior to Admission:  Prescriptions prior to admission  Medication Sig Dispense Refill Last Dose  . amLODipine (NORVASC) 10 MG tablet Take 10 mg by mouth daily.   08/27/2014 at Unknown time  . aspirin 81 MG tablet Take 81 mg by mouth daily.   08/26/2014 at Unknown time  . atorvastatin (LIPITOR) 20 MG tablet Take 1 tablet (20 mg total) by mouth daily at 6 PM. 30 tablet 0 08/26/2014 at Unknown time  . calcitRIOL (ROCALTROL) 0.25 MCG capsule Take 1 capsule (0.25 mcg total) by mouth daily. 30 capsule 0 08/27/2014 at Unknown time  . calcium acetate (PHOSLO) 667 MG capsule Take 667 mg by mouth 3 (three) times daily with meals.   08/27/2014 at Unknown time  . carvedilol (COREG) 6.25 MG tablet Take 3 tablets (18.75 mg total) by mouth 2 (two) times daily with a meal. 180 tablet 0 08/27/2014 at 0800  . furosemide (LASIX) 80 MG tablet Take 2 tablets (160 mg total) by mouth 2 (two) times daily. 30 tablet 0 08/27/2014 at Unknown time  . insulin aspart (NOVOLOG) 100 UNIT/ML injection Inject 0-12 Units into the skin 3 (three) times daily with meals. SSI  BS  150-200  2 u    To be given SQ                 201-250  4 u  251-300  6 u                 301-350  8 u                  351-400  10 u                   > 400  12 u and call MD   Past Week at Unknown time  . insulin glargine (LANTUS) 100 UNIT/ML injection Inject 0.05 mLs (5 Units total) into the skin at bedtime. 10 mL 11 08/26/2014 at Unknown time  . isosorbide mononitrate (IMDUR) 60 MG 24 hr tablet Take 1 tablet (60 mg total) by mouth daily. 30 tablet 0 08/27/2014 at Unknown time  . ranitidine (ZANTAC) 150 MG tablet Take 150 mg by mouth daily.   08/27/2014 at Unknown time  . saccharomyces boulardii (FLORASTOR) 250 MG capsule Take 1 capsule (250 mg total) by mouth 2 (two) times daily.   08/27/2014 at Unknown time  . sevelamer carbonate (RENVELA) 800 MG tablet Take 1,600 mg by mouth 3 (three) times daily with meals.    08/27/2014 at Unknown time  . sodium bicarbonate 650 MG tablet Take 2 tablets (1,300 mg total) by mouth 2 (two) times daily. (Patient taking differently: Take 650 mg by mouth 4 (four) times daily. ) 120 tablet 1 08/27/2014 at Unknown time  . thiamine (VITAMIN B-1) 100 MG tablet Take 100 mg by mouth every morning.   08/27/2014 at Unknown time  . warfarin (COUMADIN) 2 MG tablet Take 2 mg by mouth daily.   08/27/2014 at 1800  . acetaminophen (TYLENOL) 325 MG tablet Take 2 tablets (650 mg total) by mouth every 6 (six) hours as needed for mild pain (or Fever >/= 101). (Patient not taking: Reported on 01/14/2014)   Not Taking at Unknown time  . enoxaparin (LOVENOX) 60 MG/0.6ML injection Inject 0.55 mLs (55 mg total) into the skin daily. To be taking daily until INR therapeutic X 2 days (Patient not taking: Reported on 08/27/2014) 0 Syringe  Not Taking at Unknown time  . glucose blood test strip Use as instructed 100 each 12 Taking  . Nutritional Supplements (FEEDING SUPPLEMENT, NEPRO CARB STEADY,) LIQD Take 237 mLs by mouth 3 (three) times daily between meals. (Patient not taking: Reported on 08/27/2014) 90 Can 0 Not Taking at Unknown time  . vancomycin (VANCOCIN) 50 mg/mL oral solution Take 2.5 mLs (125 mg total) by mouth every 6 (six) hours. (Patient not taking: Reported on 08/27/2014)   Not Taking at Unknown time  . warfarin (COUMADIN) 5 MG tablet Take 1 tablet (5 mg total) by mouth daily. (Patient not taking: Reported on 08/27/2014)   Not Taking at Unknown time   Scheduled: . amLODipine  10 mg Oral Daily  . aspirin  81 mg Oral Daily  . atorvastatin  20 mg Oral q1800  . calcitRIOL  0.25 mcg Oral Daily  . calcium acetate  667 mg Oral TID WC  . carvedilol  18.75 mg Oral BID WC  . famotidine  20 mg Oral QHS  . feeding supplement (NEPRO CARB STEADY)  237 mL Oral BID BM  . furosemide  160 mg Oral BID  . hydrALAZINE  10 mg Oral 3 times per day  . isosorbide mononitrate  60 mg Oral Daily  . saccharomyces  boulardii  250 mg Oral BID  . sevelamer carbonate  1,600 mg Oral TID WC  . sodium chloride  3 mL  Intravenous Q12H  . thiamine  100 mg Oral q morning - 10a  . vancomycin  125 mg Oral QID   Followed by  . [START ON 09/11/2014] vancomycin  125 mg Oral BID   Followed by  . [START ON 09/19/2014] vancomycin  125 mg Oral Daily   Followed by  . [START ON 09/26/2014] vancomycin  125 mg Oral QODAY   Followed by  . [START ON 10/04/2014] vancomycin  125 mg Oral Q3 days  . warfarin  1 mg Oral ONCE-1800  . Warfarin - Pharmacist Dosing Inpatient   Does not apply q1800   Continuous: .  sodium bicarbonate  infusion 1000 mL 75 mL/hr at 08/29/14 2216   ZOX:[id number] **OR** acetaminophen, ondansetron **OR** ondansetron (ZOFRAN) IV  Assessment/Plan:  Acute on Chronic Renal Failure: Possibly secondary to volume depletion and infection given underlying sepsis secondary to C. Difficile. FENa indicated intrinsic renal cause at 1.6%. Baseline creatinine appears to be around 4, with GFR of 18. Creatinine has trended 7.86> 7.94>7.66>7.88 this morning. UOP -620 yesterday. Patient has well matured LUE AVF in anticipation of starting HD. We will continue to monitor patient as there are no emergent needs for HD at this time. Patient will likely need to be started on HD this week. It is unclear if he has been established at an outpatient HD center yet. He states Dr. Darrick Penna told him his center would be [address]? -Continue foley -Trend RFP daily -Strict I/Os -Sodium bicarb infusion 75 cc/hr  -May need to have CLIP process  Non-gapped Metabolic Acidosis: Likely secondary to RTA from CKD. Bicarb 15>17>18 this morning. -Sodium bicarb infusion 75 cc/hr   Sepsis Secondary to C. Diff Colitis: C. Diff positive in setting of diarrheal illness.  -On vancomycin po   LOS: 3 days   Jill Alexanders, DO PGY-2 Internal Medicine Resident Pager # [phone] 08/30/2014 11:20 AM

## 2014-08-30 NOTE — Progress Notes (Signed)
ANTICOAGULATION CONSULT NOTE - Follow-up  Pharmacy Consult for Coumadin Indication: DVT (history)  Allergies  Allergen Reactions  . Pork-Derived Conservation officer, nature and Other (See Comments)    The patient has tolerated lovenox  . Lactose Intolerance (Gi)     Patient Measurements: Height:  (170.2 cm) Weight: 110 lb 9.6 oz (50.168 kg) IBW/kg (Calculated) : 66.1   Vital Signs: Temp: 97.5 F (36.4 C) (07/31 1034) Temp Source: Oral (07/31 1034) BP: 142/77 mmHg (07/31 1034) Pulse Rate: 75 (07/31 1034)  Labs:  Recent Labs  08/28/14 0450 08/29/14 0500 08/29/14 0510 08/30/14 0556  HGB 10.5*  --  10.1* 10.6*  HCT 31.5*  --  30.0* 31.2*  PLT 113*  --  120* 125*  LABPROT 21.1*  --  22.8* 28.3*  INR 1.83*  --  2.02* 2.70*  CREATININE 7.94* 7.52* 7.66* 7.88*    Estimated Creatinine Clearance: 7.3 mL/min (by C-G formula based on Cr of 7.88).  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 INR remains therapeutic but did increase significantly from yesterday. CBC is stable no bleeding noted.    Goal of Therapy:  INR 2-3   Plan:  - Warfarin  PO x 1 tonight - Daily INR  Lysle Pearl, PharmD, BCPS Pager # [phone] 08/30/2014 10:52 AM

## 2014-08-30 NOTE — Progress Notes (Signed)
PROGRESS NOTE  Darden Flemister ZOX:[id number] DOB: [birth year] DOA: 08/27/2014 PCP: Lonia Blood, MD  HPI: 59 year old male with HTN, CHF, seizures, Type 2 diabetes mellitus, Hepatitis C, and Stage 4 CKD who had a fistula placed in anticipation of future hemodialysis was brought to the ED via EMS for a fever and mildly decreased mentation per family members. According to family, patient was sitting outside and "did not look well". He was febrile and was not acting himself. He has been having diarrhea for the past month and has not taken anything for it. He has not been on any recent antibiotics. The patient denies nausea, vomiting, abdominal pain, shortness of breath and chest pain. He was treated in 12/2013 for C.diff. He was admitted to inpatient on 08/27/14.  Subjective / 24 H Interval events - He feels good this morning, has no specific complaints - Denies any chest pain, abdominal pain, nausea vomiting - He is eating well - His diarrhea is improved, his stools are more formed  Assessment/Plan: Principal Problem:   Fever Active Problems:   Hypertension   H/O: CVA (cerebrovascular accident)   End stage renal disease   DM (diabetes mellitus), type 2 with renal complications   Anemia of renal disease   Diarrhea   Clostridium difficile colitis   Pressure ulcer   Sepsis due to C diff colitis - C. difficile tested positive, he has recurrent episode - continue po vancomycin taper - Diarrhea has improved - Started on IV vanc/zosyn 08/27/14. These were discontinued on 08/27/14 due to stool being C.Diff positive.  - CXR negative for pneumonia, UA negative for infection  Acute on Chronic Kidney Disease, Stage 4-5 - Possibly due to dehydration from fluid loss due to persistent diarrhea  - Consulted nephrology - it appears the patient will start hemodialysis next week  Hypertension - On amlodipine, carvedilol - poorly controlled still, added hydralazine on 7/30, better controlled today    Type 2 Diabetes Mellitus - Insulin dependent, SSI, meal coverage - Monitor CBG, within acceptable range today   CHF - Currently stable - Echo on 09/12/13: LVEF 55-60% - On lasix and imdur  Hyperlipidemia - On atorvastatin  History of CVA - On aspirin  - On warfarin per pharmacy   Diet: Diet renal/carb modified with fluid restriction Diet-HS Snack?: Nothing; Room service appropriate?: Yes; Fluid consistency:: Thin Fluids: IVF DVT Prophylaxis: On warfarin  Code Status: Full Code Family Communication: Patient Disposition Plan: Remain inpatient  Consultants:  Nephrology   Antibiotics  IV Vanc 08/27/14 >> 08/28/14  Zosyn 08/27/14 >> 08/28/14  PO Vanc 08/28/14 >>   Studies No results found. Objective  Filed Vitals:   08/29/14 1000 08/29/14 2203 08/30/14 0618 08/30/14 1034  BP: 182/81 182/89 157/65 142/77  Pulse: 79 84 77 75  Temp: 100.2 F (37.9 C) 99.1 F (37.3 C) 98.1 F (36.7 C) 97.5 F (36.4 C)  TempSrc: Oral Oral Oral Oral  Resp: 18 18 18 20   Height:      Weight:   50.168 kg (110 lb 9.6 oz)   SpO2: 98% 92% 94% 100%    Intake/Output Summary (Last 24 hours) at 08/30/14 1342 Last data filed at 08/30/14 1006  Gross per 24 hour  Intake   2310 ml  Output    470 ml  Net   1840 ml   Filed Weights   08/28/14 0013 08/28/14 2014 08/30/14 0618  Weight: 50.1 kg (110 lb 7.2 oz) 52 kg (114 lb 10.2 oz) 50.168 kg (110 lb 9.6 oz)  Exam:  General:  NAD  HEENT: no scleral icterus  Cardiovascular: RRR without MRG, 2+ peripheral pulses, no edema  Respiratory: CTA biL, good air movement, no wheezing, no crackles, no rales  Abdomen: BS +, soft, non tender, no guarding  Data Reviewed: Basic Metabolic Panel:  Recent Labs Lab 08/27/14 1910 08/28/14 0450 08/29/14 0500 08/29/14 0510 08/30/14 0556  NA 138 138 137 139 138  K 4.3 4.0 3.8 3.8 3.6  CL 115* 118* 112* 114* 109  CO2 13* 15* 17* 17* 18*  GLUCOSE 128* 150* 158* 157* 130*  BUN 90* 87* 84* 83*  82*  CREATININE 7.86* 7.94* 7.52* 7.66* 7.88*  CALCIUM 8.7* 8.2* 8.4* 8.4* 8.6*  PHOS  --   --  3.2  --  3.5   Liver Function Tests:  Recent Labs Lab 08/27/14 1910 08/28/14 0450 08/29/14 0500 08/29/14 0510 08/30/14 0556  AST 29 26  --  22 22  ALT 25 24  --  21 19  ALKPHOS 78 72  --  76 72  BILITOT 0.6 0.8  --  0.6 0.7  PROT 6.7 6.8  --  6.8 7.1  ALBUMIN 2.4* 2.2* 2.3* 2.3* 2.2*   CBC:  Recent Labs Lab 08/27/14 1910 08/28/14 0450 08/29/14 0510 08/30/14 0556  WBC 8.9 9.2 8.3 9.6  NEUTROABS  --  6.9 6.8 7.4  HGB 10.5* 10.5* 10.1* 10.6*  HCT 32.2* 31.5* 30.0* 31.2*  MCV 87.7 87.0 85.5 86.4  PLT 114* 113* 120* 125*    CBG:  Recent Labs Lab 08/27/14 1855  GLUCAP 132*    Recent Results (from the past 240 hour(s))  Blood Culture (routine x 2)     Status: None (Preliminary result)   Collection Time: 08/27/14  8:07 PM  Result Value Ref Range Status   Specimen Description BLOOD RIGHT HAND  Final   Special Requests BOTTLES DRAWN AEROBIC AND ANAEROBIC  Final   Culture NO GROWTH 3 DAYS  Final   Report Status PENDING  Incomplete  Blood Culture (routine x 2)     Status: None (Preliminary result)   Collection Time: 08/27/14  8:37 PM  Result Value Ref Range Status   Specimen Description BLOOD RIGHT ARM  Final   Special Requests BOTTLES DRAWN AEROBIC AND ANAEROBIC  Final   Culture NO GROWTH 3 DAYS  Final   Report Status PENDING  Incomplete  Urine culture     Status: None   Collection Time: 08/27/14  8:50 PM  Result Value Ref Range Status   Specimen Description URINE, CATHETERIZED  Final   Special Requests NONE  Final   Culture NO GROWTH 2 DAYS  Final   Report Status 08/29/2014 FINAL  Final  Stool culture     Status: None (Preliminary result)   Collection Time: 08/28/14  2:06 AM  Result Value Ref Range Status   Specimen Description STOOL  Final   Special Requests NONE  Final   Culture   Final    Culture reincubated for better growth Performed at [facility]    Report Status PENDING  Incomplete  Clostridium Difficile by PCR (not at [facility])     Status: Abnormal   Collection Time: 08/28/14 12:00 PM  Result Value Ref Range Status   C difficile by pcr POSITIVE (A) NEGATIVE Final    Comment: CRITICAL RESULT CALLED TO, READ BACK BY AND VERIFIED WITH: Tora Perches RN 13:05 08/28/14 ([email or web address])      Scheduled Meds: . amLODipine  10 mg  Oral Daily  . aspirin  81 mg Oral Daily  . atorvastatin  20 mg Oral q1800  . calcitRIOL  0.25 mcg Oral Daily  . calcium acetate  667 mg Oral TID WC  . carvedilol  18.75 mg Oral BID WC  . famotidine  20 mg Oral QHS  . feeding supplement (NEPRO CARB STEADY)  237 mL Oral BID BM  . furosemide  160 mg Oral BID  . hydrALAZINE  10 mg Oral 3 times per day  . isosorbide mononitrate  60 mg Oral Daily  . saccharomyces boulardii  250 mg Oral BID  . sevelamer carbonate  1,600 mg Oral TID WC  . sodium chloride  3 mL Intravenous Q12H  . thiamine  100 mg Oral q morning - 10a  . vancomycin  125 mg Oral QID   Followed by  . [START ON 09/11/2014] vancomycin  125 mg Oral BID   Followed by  . [START ON 09/19/2014] vancomycin  125 mg Oral Daily   Followed by  . [START ON 09/26/2014] vancomycin  125 mg Oral QODAY   Followed by  . [START ON 10/04/2014] vancomycin  125 mg Oral Q3 days  . warfarin  1 mg Oral ONCE-1800  . Warfarin - Pharmacist Dosing Inpatient   Does not apply q1800   Continuous Infusions: .  sodium bicarbonate  infusion 1000 mL 75 mL/hr at 08/29/14 2216    Pamella Pert, MD [facility] Pager [phone]. If 7 PM - 7 AM, please contact night-coverage at [email or web address], password [facility] 08/30/2014, 1:42 PM  LOS: 3 days

## 2014-08-30 NOTE — Evaluation (Signed)
Physical Therapy Evaluation Patient Details Name: [name] [name] MRN: [id number] DOB: September 06, 1955 Today's Date: 08/30/2014   History of Present Illness  Patient is a 59 yo male admitted 08/27/14 with fever and diarrhea.  Patient with C-diff and acute on chronic renal failure.  PMH:  CKD, HTN, seizures, DM, CVA - LLE paresis  Clinical Impression  Patient presents with problems listed below.  Will benefit from acute PT to maximize functional independence prior to discharge.  Patient close to baseline mobility status.  Do not anticipate any f/u PT needs at d/c.    Follow Up Recommendations No PT follow up;Supervision for mobility/OOB    Equipment Recommendations  None recommended by PT    Recommendations for Other Services       Precautions / Restrictions Precautions Precautions: Fall Restrictions Weight Bearing Restrictions: No      Mobility  Bed Mobility Overal bed mobility: Modified Independent             General bed mobility comments: Increased time for mobility  Transfers Overall transfer level: Needs assistance Equipment used: Rolling walker (2 wheeled) Transfers: Sit to/from Stand Sit to Stand: Supervision         General transfer comment: Supervision for safety.  Ambulation/Gait Ambulation/Gait assistance: Min guard Ambulation Distance (Feet): 30 Feet Assistive device: Rolling walker (2 wheeled) Gait Pattern/deviations: Step-to pattern;Decreased stance time - left;Decreased step length - right;Decreased step length - left;Decreased stride length;Decreased dorsiflexion - left;Decreased weight shift to left;Steppage Gait velocity: Decreased Gait velocity interpretation: Below normal speed for age/gender General Gait Details: Patient demonstrates safe use of RW.  Patient with decreased stability of LLE in stance due to weakness.  Able to advance LLE independently.  No dorsiflexion noted on Lt during gait.  Stairs            Wheelchair Mobility     Modified Rankin (Stroke Patients Only)       Balance                                             Pertinent Vitals/Pain Pain Assessment: Faces Faces Pain Scale: Hurts a little bit Pain Location: Neck Pain Descriptors / Indicators: Aching Pain Intervention(s): Monitored during session;Repositioned    Home Living Family/patient expects to be discharged to:: Private residence Living Arrangements: Other relatives (Brother) Available Help at Discharge: Family;Available 24 hours/day Type of Home: House Home Access: Stairs to enter Entrance Stairs-Rails: None Entrance Stairs-Number of Steps: 3 Home Layout: One level Home Equipment: Cane - single point;Wheelchair - manual;Toilet riser;Shower seat;Walker - 2 wheels      Prior Function Level of Independence: Independent with assistive device(s)         Comments: Uses RW for gait. Does not drive.  Sister provides transportation.     Hand Dominance   Dominant Hand: Right    Extremity/Trunk Assessment   Upper Extremity Assessment: Overall WFL for tasks assessed           Lower Extremity Assessment: Generalized weakness;LLE deficits/detail   LLE Deficits / Details: Paresis from prior CVA.  Strength grossly 3/5 proximally and 2-/5 distally     Communication   Communication: No difficulties  Cognition Arousal/Alertness: Awake/alert Behavior During Therapy: WFL for tasks assessed/performed Overall Cognitive Status: Within Functional Limits for tasks assessed  General Comments      Exercises        Assessment/Plan    PT Assessment Patient needs continued PT services  PT Diagnosis Abnormality of gait;Generalized weakness   PT Problem List Decreased strength;Decreased activity tolerance;Decreased balance;Decreased mobility  PT Treatment Interventions DME instruction;Gait training;Functional mobility training;Therapeutic activities;Patient/family education   PT  Goals (Current goals can be found in the Care Plan section) Acute Rehab PT Goals Patient Stated Goal: To return home PT Goal Formulation: With patient Time For Goal Achievement: 09/06/14 Potential to Achieve Goals: Good    Frequency Min 3X/week   Barriers to discharge        Co-evaluation               End of Session Equipment Utilized During Treatment: Gait belt Activity Tolerance: Patient tolerated treatment well;Patient limited by fatigue Patient left: in bed;with call bell/phone within reach;with bed alarm set (sitting EOB for lunch) Nurse Communication: Mobility status         Time: 1610-9604 PT Time Calculation (min) (ACUTE ONLY): 13 min   Charges:   PT Evaluation $Initial PT Evaluation Tier I: 1 Procedure     PT G CodesVena Austria 09-17-14, 2:49 PM Durenda Hurt. Renaldo Fiddler, [facility] Acute Rehab Services Pager [phone]

## 2014-08-31 ENCOUNTER — Inpatient Hospital Stay (HOSPITAL_COMMUNITY): Payer: Medicare Other

## 2014-08-31 DIAGNOSIS — D631 Anemia in chronic kidney disease: Secondary | ICD-10-CM

## 2014-08-31 DIAGNOSIS — L97521 Non-pressure chronic ulcer of other part of left foot limited to breakdown of skin: Secondary | ICD-10-CM

## 2014-08-31 LAB — RENAL FUNCTION PANEL
Albumin: 2.1 g/dL — ABNORMAL LOW (ref 3.5–5.0)
Anion gap: 7 (ref 5–15)
BUN: 80 mg/dL — AB (ref 6–20)
CHLORIDE: 107 mmol/L (ref 101–111)
CO2: 22 mmol/L (ref 22–32)
CREATININE: 7.58 mg/dL — AB (ref 0.61–1.24)
Calcium: 8.4 mg/dL — ABNORMAL LOW (ref 8.9–10.3)
GFR calc non Af Amer: 7 mL/min — ABNORMAL LOW (ref >60)
GFR, EST AFRICAN AMERICAN: 8 mL/min — AB (ref >60)
Glucose, Bld: 197 mg/dL — ABNORMAL HIGH (ref 65–99)
POTASSIUM: 3.4 mmol/L — AB (ref 3.5–5.1)
Phosphorus: 2.9 mg/dL (ref 2.5–4.6)
Sodium: 136 mmol/L (ref 135–145)

## 2014-08-31 LAB — STOOL CULTURE

## 2014-08-31 LAB — PROTIME-INR
INR: 2.99 — AB (ref 0.00–1.49)
Prothrombin Time: 30.5 seconds — ABNORMAL HIGH (ref 11.6–15.2)

## 2014-08-31 MED ORDER — WARFARIN SODIUM 2 MG PO TABS
2.0000 mg | ORAL_TABLET | Freq: Once | ORAL | Status: AC
  Administered 2014-08-31: 2 mg via ORAL
  Filled 2014-08-31: qty 1

## 2014-08-31 MED ORDER — SODIUM BICARBONATE 650 MG PO TABS
650.0000 mg | ORAL_TABLET | Freq: Two times a day (BID) | ORAL | Status: DC
  Administered 2014-08-31 – 2014-09-02 (×5): 650 mg via ORAL
  Filled 2014-08-31 (×6): qty 1

## 2014-08-31 NOTE — Care Management Note (Signed)
Case Management Note  Patient Details  Name: [name] [name] MRN: [id number] Date of Birth: 1956-01-11  Subjective/Objective:      CM following for progression and d/c planning.              Action/Plan: 08/31/2014 Met with pt re d/c needs, pt plans to return to home where he lives with his brother. Pt states that his sister will be here later today and she knows about his past [name] services and other needs. This CM will attempt to meet with the sister.  IM given..  Expected Discharge Date:       09/04/2014           Expected Discharge Plan:  [place]  In-House Referral:  NA  Discharge planning Services  NA  Post Acute Care Choice:  NA Choice offered to:  NA  DME Arranged:    DME Agency:     HH Arranged:    HH Agency:     Status of Service:  In process, will continue to follow  Medicare Important Message Given:    Date Medicare IM Given:    Medicare IM give by:    Date Additional Medicare IM Given:    Additional Medicare Important Message give by:     If discussed at [place] of Stay Meetings, dates discussed:    Additional Comments:  Adron Bene, RN 08/31/2014, 10:36 AM

## 2014-08-31 NOTE — Progress Notes (Signed)
ANTICOAGULATION CONSULT NOTE - Follow-up  Pharmacy Consult for Coumadin Indication: DVT (history)  Allergies  Allergen Reactions  . Pork-Derived Conservation officer, nature and Other (See Comments)    The patient has tolerated lovenox  . Lactose Intolerance (Gi)     Patient Measurements: Height:  (170.2 cm) Weight: 115 lb 11.2 oz (52.481 kg) IBW/kg (Calculated) : 66.1   Vital Signs: Temp: 98.4 F (36.9 C) (08/01 0842) Temp Source: Oral (08/01 0842) BP: 174/70 mmHg (08/01 0842) Pulse Rate: 69 (08/01 0842)  Labs:  Recent Labs  08/29/14 0510 08/30/14 0556 08/31/14 0525  HGB 10.1* 10.6*  --   HCT 30.0* 31.2*  --   PLT 120* 125*  --   LABPROT 22.8* 28.3* 30.5*  INR 2.02* 2.70* 2.99*  CREATININE 7.66* 7.88* 7.58*    Estimated Creatinine Clearance: 7.9 mL/min (by C-G formula based on Cr of 7.58).  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 PTA dose was 2 mg daily. INR today remains therapeutic (INR 2.99 << 2.7, goal of 2-3). No CBC today - no overt s/sx of bleeding noted.   The patient was re-educated on warfarin today.  Goal of Therapy:  INR 2-3   Plan:  1. Warfarin 2 mg x 1 dose at 1800 today 2. Will continue to monitor for any signs/symptoms of bleeding and will follow up with PT/INR in the a.m.  Georgina Pillion, PharmD, BCPS Clinical Pharmacist Pager: [phone] 08/31/2014 2:19 PM

## 2014-08-31 NOTE — Consult Note (Signed)
WOC wound consult note Reason for Consult: right foot ulcer Patient with history of DM2, CHF, CKD stage IV.  Reports calloused area on the plantar surface from "playing basketball" and additional area that has been "cut on" by podiatry. 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  Measurement:plantar surface: 3cm x 3cm x with 0.5cm x 0.5cm opening centrally that undermines at 11-12 o'clock 1cm; bulla has ruptured and is 3cm x 3.5cm x 0.1cm  Wound bed: plantar surface: yellow, fluctuant, medial-ruptured bulla with serous drainage and pink tissue in wound base  Drainage (amount, consistency, odor) serosanguinous  Periwound: erythema and edema, per patient and nursing staff new onset Dressing procedure/placement/frequency: Will start antimicrobial making sure to pack into the plantar surface wound and cover the newly ruptured bulla, top with dry gauze.  Recommended plain films just to see if any underlying osteomyelitis since this patient has open foot wound with undetermined length of time present.  Discussed POC with patient and bedside nurse.  Re consult if needed, will not follow at this time. Thanks  Mairin Lindsley [employer], CWOCN [phone])

## 2014-08-31 NOTE — Progress Notes (Signed)
PROGRESS NOTE  [name] [name] XBM:[id number] DOB: [birth year] DOA: 08/27/2014 PCP: Lonia Blood, MD  HPI: 59 year old male with HTN, CHF, seizures, Type 2 diabetes mellitus, Hepatitis C, and Stage 4 CKD who had a fistula placed in anticipation of future hemodialysis was brought to the ED via EMS for a fever and mildly decreased mentation per family members. According to family, patient was sitting outside and "did not look well". He was febrile and was not acting himself. He has been having diarrhea for the past month and has not taken anything for it. He has not been on any recent antibiotics. The patient denies nausea, vomiting, abdominal pain, shortness of breath and chest pain. He was treated in 12/2013 for C.diff. He was admitted to inpatient on 08/27/14.  Subjective / 24 H Interval events - He feels good this morning, has no specific complaints - Denies any chest pain, abdominal pain, nausea vomiting - He is eating well - His diarrhea is improved, his stools are more formed  Assessment/Plan: Principal Problem:   Fever Active Problems:   Hypertension   H/O: CVA (cerebrovascular accident)   End stage renal disease   DM (diabetes mellitus), type 2 with renal complications   Anemia of renal disease   Diarrhea   Clostridium difficile colitis   Pressure ulcer   Sepsis due to C diff colitis - C. difficile tested positive, he has recurrent episode - continue po vancomycin taper - Diarrhea has improved - Started on IV vanc/zosyn 08/27/14. These were discontinued on 08/27/14 due to stool being C.Diff positive.  - CXR negative for pneumonia, UA negative for infection,   Acute on Chronic Kidney Disease, Stage 4-5 - Possibly due to dehydration from fluid loss due to persistent diarrhea  - Consulted nephrology  Right foot wound - opened up and started draining last night - wound care consulted - obtain plain films  - hold antibiotics for now given #1  Hypertension - On amlodipine,  carvedilol, hydralazine  Type 2 Diabetes Mellitus - Insulin dependent, SSI, meal coverage - Monitor CBG, within acceptable range today   CHF - I am not clear what type of CHF, no prior echoes to support this in our system - Currently stable - Echo on 09/12/13: LVEF 55-60%,  - On lasix and imdur  Hyperlipidemia - On atorvastatin  History of CVA - On aspirin  - On warfarin per pharmacy   Diet: Diet renal/carb modified with fluid restriction Diet-HS Snack?: Nothing; Room service appropriate?: Yes; Fluid consistency:: Thin Fluids: IVF DVT Prophylaxis: On warfarin  Code Status: Full Code Family Communication: Patient Disposition Plan: Remain inpatient  Consultants:  Nephrology   Antibiotics  IV Vanc 08/27/14 >> 08/28/14  Zosyn 08/27/14 >> 08/28/14  PO Vanc 08/28/14 >>   Studies No results found. Objective  Filed Vitals:   08/30/14 1759 08/30/14 2112 08/31/14 0604 08/31/14 0842  BP: 163/78 167/77 178/91 174/70  Pulse: 56 75 76 69  Temp: 97.7 F (36.5 C) 98.2 F (36.8 C) 98.3 F (36.8 C) 98.4 F (36.9 C)  TempSrc: Oral Oral Oral Oral  Resp: 20 18 18 17   Height:      Weight:  51.71 kg (114 lb) 52.481 kg (115 lb 11.2 oz)   SpO2: 97% 97% 93% 97%    Intake/Output Summary (Last 24 hours) at 08/31/14 1324 Last data filed at 08/31/14 0843  Gross per 24 hour  Intake 2406.25 ml  Output    700 ml  Net 1706.25 ml   [employer]  08/30/14 0618 08/30/14 2112 08/31/14 0604  Weight: 50.168 kg (110 lb 9.6 oz) 51.71 kg (114 lb) 52.481 kg (115 lb 11.2 oz)   Exam:  General:  NAD  HEENT: no scleral icterus  Cardiovascular: RRR without MRG, 2+ peripheral pulses, no edema  Respiratory: CTA biL, good air movement, no wheezing, no crackles, no rales  Abdomen: BS +, soft, non tender, no guarding  MSK: right foot with medial wound, blister like  Neuro: non focal, strength 5/5 in all 4  Psychiatry: normal mood and affect  Data Reviewed: Basic Metabolic  Panel:  Recent Labs Lab 08/28/14 0450 08/29/14 0500 08/29/14 0510 08/30/14 0556 08/31/14 0525  NA 138 137 139 138 136  K 4.0 3.8 3.8 3.6 3.4*  CL 118* 112* 114* 109 107  CO2 15* 17* 17* 18* 22  GLUCOSE 150* 158* 157* 130* 197*  BUN 87* 84* 83* 82* 80*  CREATININE 7.94* 7.52* 7.66* 7.88* 7.58*  CALCIUM 8.2* 8.4* 8.4* 8.6* 8.4*  PHOS  --  3.2  --  3.5 2.9   Liver Function Tests:  Recent Labs Lab 08/27/14 1910 08/28/14 0450 08/29/14 0500 08/29/14 0510 08/30/14 0556 08/31/14 0525  AST 29 26  --  22 22  --   ALT 25 24  --  21 19  --   ALKPHOS 78 72  --  76 72  --   BILITOT 0.6 0.8  --  0.6 0.7  --   PROT 6.7 6.8  --  6.8 7.1  --   ALBUMIN 2.4* 2.2* 2.3* 2.3* 2.2* 2.1*   CBC:  Recent Labs Lab 08/27/14 1910 08/28/14 0450 08/29/14 0510 08/30/14 0556  WBC 8.9 9.2 8.3 9.6  NEUTROABS  --  6.9 6.8 7.4  HGB 10.5* 10.5* 10.1* 10.6*  HCT 32.2* 31.5* 30.0* 31.2*  MCV 87.7 87.0 85.5 86.4  PLT 114* 113* 120* 125*    CBG:  Recent Labs Lab 08/27/14 1855  GLUCAP 132*    Recent Results (from the past 240 hour(s))  Blood Culture (routine x 2)     Status: None (Preliminary result)   Collection Time: 08/27/14  8:07 PM  Result Value Ref Range Status   Specimen Description BLOOD RIGHT HAND  Final   Special Requests BOTTLES DRAWN AEROBIC AND ANAEROBIC  Final   Culture NO GROWTH 3 DAYS  Final   Report Status PENDING  Incomplete  Blood Culture (routine x 2)     Status: None (Preliminary result)   Collection Time: 08/27/14  8:37 PM  Result Value Ref Range Status   Specimen Description BLOOD RIGHT ARM  Final   Special Requests BOTTLES DRAWN AEROBIC AND ANAEROBIC  Final   Culture NO GROWTH 3 DAYS  Final   Report Status PENDING  Incomplete  Urine culture     Status: None   Collection Time: 08/27/14  8:50 PM  Result Value Ref Range Status   Specimen Description URINE, CATHETERIZED  Final   Special Requests NONE  Final   Culture NO GROWTH 2 DAYS  Final   Report  Status 08/29/2014 FINAL  Final  Stool culture     Status: None   Collection Time: 08/28/14  2:06 AM  Result Value Ref Range Status   Specimen Description STOOL  Final   Special Requests NONE  Final   Culture   Final    NO SALMONELLA, SHIGELLA, CAMPYLOBACTER, YERSINIA, OR E.COLI 0157:H7 ISOLATED Performed at [employer]    Report Status 08/31/2014 FINAL  Final  Clostridium Difficile by PCR (not at [facility])     Status: Abnormal   Collection Time: 08/28/14 12:00 PM  Result Value Ref Range Status   C difficile by pcr POSITIVE (A) NEGATIVE Final    Comment: CRITICAL RESULT CALLED TO, READ BACK BY AND VERIFIED WITH: Tora Perches RN 13:05 08/28/14 ([email or web address])      Scheduled Meds: . amLODipine  10 mg Oral Daily  . aspirin  81 mg Oral Daily  . atorvastatin  20 mg Oral q1800  . calcitRIOL  0.25 mcg Oral Daily  . calcium acetate  667 mg Oral TID WC  . carvedilol  18.75 mg Oral BID WC  . famotidine  20 mg Oral QHS  . feeding supplement (NEPRO CARB STEADY)  237 mL Oral BID BM  . furosemide  160 mg Oral BID  . hydrALAZINE  10 mg Oral 3 times per day  . isosorbide mononitrate  60 mg Oral Daily  . saccharomyces boulardii  250 mg Oral BID  . sevelamer carbonate  1,600 mg Oral TID WC  . sodium bicarbonate  650 mg Oral BID  . sodium chloride  3 mL Intravenous Q12H  . thiamine  100 mg Oral q morning - 10a  . vancomycin  125 mg Oral QID   Followed by  . [START ON 09/11/2014] vancomycin  125 mg Oral BID   Followed by  . [START ON 09/19/2014] vancomycin  125 mg Oral Daily   Followed by  . [START ON 09/26/2014] vancomycin  125 mg Oral QODAY   Followed by  . [START ON 10/04/2014] vancomycin  125 mg Oral Q3 days  . Warfarin - Pharmacist Dosing Inpatient   Does not apply q1800   Continuous Infusions:    Time spent: 25 minutes  Pamella Pert, MD [facility] Pager [phone]. If 7 PM - 7 AM, please contact night-coverage at [email or web address], password [email or web address] 08/31/2014, 1:24 PM  LOS: 4 days

## 2014-08-31 NOTE — Progress Notes (Signed)
08/31/2014 10:58 AM  During assessment this morning I noted a minimal-moderate amount of purulent discharge leaking from right medial aspect of the upper right foot.  Right foot is reddened, edematous, and tender to touch.  Pt states his foot began looking like this yesterday evening, with drainage starting overnight.  Dr. Elvera Lennox notified, order for [facility] consult placed.  Will monitor. Theadora Rama

## 2014-08-31 NOTE — Progress Notes (Signed)
Subjective:  Says he feels better- no diarrhea- been up walking around in the room with assist- waiting to eat- 700 uop- creatinine down a little Objective Vital signs in last 24 hours: Filed Vitals:   08/30/14 1759 08/30/14 2112 08/31/14 0604 08/31/14 0842  BP: 163/78 167/77 178/91 174/70  Pulse: 56 75 76 69  Temp: 97.7 F (36.5 C) 98.2 F (36.8 C) 98.3 F (36.8 C) 98.4 F (36.9 C)  TempSrc: Oral Oral Oral Oral  Resp: Height:      Weight:  51.71 kg (114 lb) 52.481 kg (115 lb 11.2 oz)   SpO2: 97% 97% 93% 97%   Weight change: 1.542 kg (3 lb 6.4 oz)  Intake/Output Summary (Last 24 hours) at 08/31/14 1118 Last data filed at 08/31/14 0843  Gross per 24 hour  Intake 2406.25 ml  Output    700 ml  Net 1706.25 ml    Assessment/ Plan: Pt is a 59 y.o. yo male who was admitted on 08/27/2014 with a fever and decreased mentation- found to be C diff positive but also worsening renal function from baseline  Assessment/Plan: 1. Renal- advanced CKD as OP- has mature AVF in place.  He tells me that discussions have not been held as an OP to start HD, he also tells me that he is feeling better since has been in the hospital so possible that his clinical picture was more consistent with C diff than uremia.  Renal function is trending down albeit slowly.  There are no acute indications to start HD at this time.  If continues to clinically improve he may be able to be discharged without HD? However, with low albumin this raises the question of chronic indicators for HD- but could still be followed as OP  2. HTN/vol- with elevated BP do not think is dry- will change ivf to oral bicarb- continue amlodipine/coreg and hydralazine 3. Anemia- iron is OK- hgb over 10- no need for ESA right now 4. Secondary hyperparathyroidism- on phoslo/renvela and calcitriol - OK to continue all  5. C diff- on oral vanc- seems improved   Mica Releford A    Labs: Basic Metabolic Panel:  Recent  Labs Lab 08/29/14 0500 08/29/14 0510 08/30/14 0556 08/31/14 0525  NA 137 139 138 136  K 3.8 3.8 3.6 3.4*  CL 112* 114* 109 107  CO2 17* 17* 18* 22  GLUCOSE 158* 157* 130* 197*  BUN 84* 83* 82* 80*  CREATININE 7.52* 7.66* 7.88* 7.58*  CALCIUM 8.4* 8.4* 8.6* 8.4*  PHOS 3.2  --  3.5 2.9   Liver Function Tests:  Recent Labs Lab 08/28/14 0450  08/29/14 0510 08/30/14 0556 08/31/14 0525  AST 26  --  22 22  --   ALT 24  --  21 19  --   ALKPHOS 72  --  76 72  --   BILITOT 0.8  --  0.6 0.7  --   PROT 6.8  --  6.8 7.1  --   ALBUMIN 2.2*  < > 2.3* 2.2* 2.1*  < > = values in this interval not displayed. No results for input(s): LIPASE, AMYLASE in the last 168 hours. No results for input(s): AMMONIA in the last 168 hours. CBC:  Recent Labs Lab 08/27/14 1910 08/28/14 0450 08/29/14 0510 08/30/14 0556  WBC 8.9 9.2 8.3 9.6  NEUTROABS  --  6.9 6.8 7.4  HGB 10.5* 10.5* 10.1* 10.6*  HCT 32.2* 31.5* 30.0* 31.2*  MCV 87.7 87.0 85.5  86.4  PLT 114* 113* 120* 125*   Cardiac Enzymes: No results for input(s): CKTOTAL, CKMB, CKMBINDEX, TROPONINI in the last 168 hours. CBG:  Recent Labs Lab 08/27/14 1855  GLUCAP 132*    Iron Studies: No results for input(s): IRON, TIBC, TRANSFERRIN, FERRITIN in the last 72 hours. Studies/Results: No results found. Medications: Infusions: .  sodium bicarbonate  infusion 1000 mL 75 mL/hr at 08/31/14 0521    Scheduled Medications: . amLODipine  10 mg Oral Daily  . aspirin  81 mg Oral Daily  . atorvastatin  20 mg Oral q1800  . calcitRIOL  0.25 mcg Oral Daily  . calcium acetate  667 mg Oral TID WC  . carvedilol  18.75 mg Oral BID WC  . famotidine  20 mg Oral QHS  . feeding supplement (NEPRO CARB STEADY)  237 mL Oral BID BM  . furosemide  160 mg Oral BID  . hydrALAZINE  10 mg Oral 3 times per day  . isosorbide mononitrate  60 mg Oral Daily  . saccharomyces boulardii  250 mg Oral BID  . sevelamer carbonate  1,600 mg Oral TID WC  . sodium  chloride  3 mL Intravenous Q12H  . thiamine  100 mg Oral q morning - 10a  . vancomycin  125 mg Oral QID   Followed by  . [START ON 09/11/2014] vancomycin  125 mg Oral BID   Followed by  . [START ON 09/19/2014] vancomycin  125 mg Oral Daily   Followed by  . [START ON 09/26/2014] vancomycin  125 mg Oral QODAY   Followed by  . [START ON 10/04/2014] vancomycin  125 mg Oral Q3 days  . Warfarin - Pharmacist Dosing Inpatient   Does not apply q1800    have reviewed scheduled and prn medications.  Physical Exam: General: NAD Heart: RRR Lungs: mostly clear Abdomen: soft, non tender Extremities: no edema Dialysis Access: left upper arm AVF with good thrill and bruit     08/31/2014,11:18 AM  LOS: 4 days

## 2014-08-31 NOTE — Progress Notes (Signed)
PT Cancellation Note  Patient Details Name: [name] [name] MRN: [id number] DOB: 1955-05-03   Cancelled Treatment:    Reason Eval/Treat Not Completed: Patient at procedure or test/unavailable   Just now taken to Radiology;  Will follow up later today as time allows;  Otherwise, will follow up for PT tomorrow;   Thank you,  Van Clines, PT  Acute Rehabilitation Services Pager [phone] Office [phone]     Van Clines [facility] 08/31/2014, 2:50 PM

## 2014-08-31 NOTE — Care Management Important Message (Addendum)
Important Message  Patient Details  Name: [name] [name] MRN: [id number] Date of Birth: Sep 03, 1955   Medicare Important Message Given:   08/31/14    Zera Markwardt, Annamarie Major, RN 08/31/2014, 10:39 AM

## 2014-09-01 LAB — RENAL FUNCTION PANEL
ANION GAP: 11 (ref 5–15)
Albumin: 2 g/dL — ABNORMAL LOW (ref 3.5–5.0)
BUN: 86 mg/dL — ABNORMAL HIGH (ref 6–20)
CALCIUM: 8.2 mg/dL — AB (ref 8.9–10.3)
CO2: 20 mmol/L — ABNORMAL LOW (ref 22–32)
Chloride: 105 mmol/L (ref 101–111)
Creatinine, Ser: 7.57 mg/dL — ABNORMAL HIGH (ref 0.61–1.24)
GFR calc non Af Amer: 7 mL/min — ABNORMAL LOW (ref >60)
GFR, EST AFRICAN AMERICAN: 8 mL/min — AB (ref >60)
GLUCOSE: 145 mg/dL — AB (ref 65–99)
POTASSIUM: 3.4 mmol/L — AB (ref 3.5–5.1)
Phosphorus: 3 mg/dL (ref 2.5–4.6)
SODIUM: 136 mmol/L (ref 135–145)

## 2014-09-01 LAB — GI PATHOGEN PANEL BY PCR, STOOL
CRYPTOSPORIDIUM BY PCR: NOT DETECTED
Campylobacter by PCR: NOT DETECTED
E COLI (STEC): NOT DETECTED
E coli (ETEC) LT/ST: NOT DETECTED
E coli 0157 by PCR: NOT DETECTED
G lamblia by PCR: NOT DETECTED
NOROVIRUS G1/G2: NOT DETECTED
ROTAVIRUS A BY PCR: NOT DETECTED
Salmonella by PCR: NOT DETECTED
Shigella by PCR: NOT DETECTED

## 2014-09-01 LAB — CBC
HEMATOCRIT: 30.2 % — AB (ref 39.0–52.0)
Hemoglobin: 10.1 g/dL — ABNORMAL LOW (ref 13.0–17.0)
MCH: 28.3 pg (ref 26.0–34.0)
MCHC: 33.4 g/dL (ref 30.0–36.0)
MCV: 84.6 fL (ref 78.0–100.0)
PLATELETS: 149 10*3/uL — AB (ref 150–400)
RBC: 3.57 MIL/uL — ABNORMAL LOW (ref 4.22–5.81)
RDW: 14.5 % (ref 11.5–15.5)
WBC: 6.3 10*3/uL (ref 4.0–10.5)

## 2014-09-01 LAB — PROTIME-INR
INR: 2.67 — AB (ref 0.00–1.49)
Prothrombin Time: 28.1 seconds — ABNORMAL HIGH (ref 11.6–15.2)

## 2014-09-01 LAB — CULTURE, BLOOD (ROUTINE X 2)
CULTURE: NO GROWTH
CULTURE: NO GROWTH

## 2014-09-01 LAB — ALT: ALT: 28 U/L (ref 17–63)

## 2014-09-01 LAB — HEPATITIS B SURFACE ANTIGEN: HEP B S AG: NEGATIVE

## 2014-09-01 MED ORDER — WARFARIN SODIUM 2 MG PO TABS
2.0000 mg | ORAL_TABLET | Freq: Once | ORAL | Status: AC
  Administered 2014-09-01: 2 mg via ORAL
  Filled 2014-09-01: qty 1

## 2014-09-01 NOTE — Progress Notes (Signed)
ANTICOAGULATION CONSULT NOTE - Follow-up  Pharmacy Consult for Coumadin Indication: DVT (history)  Allergies  Allergen Reactions  . Pork-Derived Conservation officer, nature and Other (See Comments)    The patient has tolerated lovenox  . Lactose Intolerance (Gi)     Patient Measurements: Height:  (170.2 cm) Weight: 116 lb 1.6 oz (52.663 kg) IBW/kg (Calculated) : 66.1   Vital Signs: Temp: 98.2 F (36.8 C) (08/02 1017) Temp Source: Oral (08/02 1017) BP: 145/72 mmHg (08/02 1017) Pulse Rate: 76 (08/02 1017)  Labs:  Recent Labs  08/30/14 0556 08/31/14 0525 09/01/14 0533  HGB 10.6*  --  10.1*  HCT 31.2*  --  30.2*  PLT 125*  --  149*  LABPROT 28.3* 30.5* 28.1*  INR 2.70* 2.99* 2.67*  CREATININE 7.88* 7.58* 7.57*    Estimated Creatinine Clearance: 7.9 mL/min (by C-G formula based on Cr of 7.57).  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 PTA dose was 2 mg daily. INR today remains therapeutic (INR 2.67 << 2.99, goal of 2-3). No CBC today - no overt s/sx of bleeding noted.   The patient was re-educated on warfarin this admission.  Goal of Therapy:  INR 2-3   Plan:  1. Warfarin 2 mg x 1 dose at 1800 today 2. Will continue to monitor for any signs/symptoms of bleeding and will follow up with PT/INR in the a.m.  Georgina Pillion, PharmD, BCPS Clinical Pharmacist Pager: [phone] 09/01/2014 11:47 AM

## 2014-09-01 NOTE — Clinical Documentation Improvement (Signed)
MD's, NP's, and PA's    Does the patient meet criteria for any of the clinical conditions listed below, nutritionist evaluation on 08/28/14.  Thank you   Possible Clinical Conditions?  Severe Malnutrition   Protein Calorie Malnutrition Severe Protein Calorie Malnutrition Moderate Malnutrition   Other Condition Cannot clinically determine   Risk Factors: Cdiff Colitis, Sepsis, CKD V, DM II, FOOT ULCERS    Diagnostics: Nutritionist evaluation, BMI 17.29KG   Recommendations: Provide Nepro Shake po BID, each supplement provides 425 kcal and 19 grams protein.   Nutritionist evaluation :  "Malnutrition related to chronic illness as evidenced by severe depletion of body fat, severe depletion of muscle mass, percent weight loss. "    Thank You, Lavonda Jumbo ,RN Clinical Documentation Specialist:  [phone]  [facility]- Health Information Management

## 2014-09-01 NOTE — Procedures (Signed)
Patient was seen on dialysis and the procedure was supervised.  BFR 200  Via AVF BP is  145/72.   Patient appears to be tolerating treatment well  [name] [name] A 09/01/2014

## 2014-09-01 NOTE — Progress Notes (Signed)
PROGRESS NOTE  [name] [name] ZOX:[id number] DOB: 1955-02-17 DOA: 08/27/2014 PCP: Lonia Blood, MD  HPI: 59 year old male with HTN, CHF, seizures, Type 2 diabetes mellitus, Hepatitis C, and Stage 4 CKD who had a fistula placed in anticipation of future hemodialysis was brought to the ED via EMS for a fever and mildly decreased mentation per family members. According to family, patient was sitting outside and "did not look well". He was febrile and was not acting himself. He has been having diarrhea for the past month and has not taken anything for it. He has not been on any recent antibiotics. The patient denies nausea, vomiting, abdominal pain, shortness of breath and chest pain. He was treated in 12/2013 for C.diff. He was admitted to inpatient on 08/27/14.  Subjective / 24 H Interval events - no chest pain, shortness of breath, no abdominal pain, nausea or vomiting.  - diarrhea has resolved   Assessment/Plan: Principal Problem:   Fever Active Problems:   Hypertension   H/O: CVA (cerebrovascular accident)   End stage renal disease   DM (diabetes mellitus), type 2 with renal complications   Anemia of renal disease   Diarrhea   Clostridium difficile colitis   Pressure ulcer   Acute on Chronic Kidney Disease, Stage 4-5 - Possibly due to dehydration from fluid loss due to persistent diarrhea  - Consulted nephrology - HD x 2 days  Right foot wound - opened up and started draining  - wound care consulted, appreciate wound care plan with local antibiotics - plain films negative for air or bone involvement - hold systemic antibiotics for now given #1, does not appear overly infected  Sepsis due to C diff colitis - C. difficile tested positive, he has recurrent episode - continue po vancomycin taper - Diarrhea has resolved - Started on IV vanc/zosyn 08/27/14. These were discontinued on 08/27/14 due to stool being C.Diff positive.  - CXR negative for pneumonia, UA negative for  infection,   Hypertension - On amlodipine, carvedilol, hydralazine  Type 2 Diabetes Mellitus - Insulin dependent, SSI, meal coverage - Monitor CBG, within acceptable range today   CHF - I am not clear what type of CHF, no prior echoes to support this in our system - Currently stable - Echo on 09/12/13: LVEF 55-60%,  - On lasix and imdur  Hyperlipidemia - On atorvastatin  History of CVA - On aspirin  - On warfarin per pharmacy   Diet: Diet renal/carb modified with fluid restriction Diet-HS Snack?: Nothing; Room service appropriate?: Yes; Fluid consistency:: Thin Fluids: IVF DVT Prophylaxis: On warfarin  Code Status: Full Code Family Communication: Patient Disposition Plan: Remain inpatient  Consultants:  Nephrology   Antibiotics  IV Vanc 08/27/14 >> 08/28/14  Zosyn 08/27/14 >> 08/28/14  PO Vanc 08/28/14 >>   Studies Dg Foot 2 Views Right  08/31/2014   CLINICAL DATA:  Wound abscess for several weeks.  EXAM: RIGHT FOOT - 2 VIEW  COMPARISON:  None.  FINDINGS: The joint spaces are maintained. Moderate osteoporosis and mild degenerative changes but no destructive bony changes to suggest osteomyelitis. No gas is seen in the soft tissues. There are extensive small vessel calcifications.  IMPRESSION: No definite plain film findings for osteomyelitis.   Electronically Signed   By: Rudie Meyer M.D.   On: 08/31/2014 15:11   Objective  Filed Vitals:   08/31/14 1645 08/31/14 2100 09/01/14 0500 09/01/14 1017  BP: 160/76 158/71 179/83 145/72  Pulse: 69 72 74 76  Temp: 97.6 F (36.4 C)  98 F (36.7 C) 98.7 F (37.1 C) 98.2 F (36.8 C)  TempSrc: Oral Oral Oral Oral  Resp: Height:      Weight:  52.663 kg (116 lb 1.6 oz)    SpO2: 96% 94% 93% 95%    Intake/Output Summary (Last 24 hours) at 09/01/14 1523 Last data filed at 09/01/14 1402  Gross per 24 hour  Intake    480 ml  Output   1150 ml  Net   -670 ml   Filed Weights   08/30/14 2112 08/31/14 0604  08/31/14 2100  Weight: 51.71 kg (114 lb) 52.481 kg (115 lb 11.2 oz) 52.663 kg (116 lb 1.6 oz)   Exam:  General:  NAD  HEENT: no scleral icterus  Cardiovascular: RRR without MRG, 2+ peripheral pulses, no edema  Respiratory: CTA biL, good air movement, no wheezing, no crackles, no rales  Abdomen: BS +, soft, non tender, no guarding  MSK: right foot with medial wound, malodorous drainage, no erythema  Data Reviewed: Basic Metabolic Panel:  Recent Labs Lab 08/29/14 0500 08/29/14 0510 08/30/14 0556 08/31/14 0525 09/01/14 0533  NA 137 139 138 136 136  K 3.8 3.8 3.6 3.4* 3.4*  CL 112* 114* 109 107 105  CO2 17* 17* 18* 22 20*  GLUCOSE 158* 157* 130* 197* 145*  BUN 84* 83* 82* 80* 86*  CREATININE 7.52* 7.66* 7.88* 7.58* 7.57*  CALCIUM 8.4* 8.4* 8.6* 8.4* 8.2*  PHOS 3.2  --  3.5 2.9 3.0   Liver Function Tests:  Recent Labs Lab 08/27/14 1910 08/28/14 0450 08/29/14 0500 08/29/14 0510 08/30/14 0556 08/31/14 0525 09/01/14 0533  AST 29 26  --  22 22  --   --   ALT 25 24  --  21 19  --   --   ALKPHOS 78 72  --  76 72  --   --   BILITOT 0.6 0.8  --  0.6 0.7  --   --   PROT 6.7 6.8  --  6.8 7.1  --   --   ALBUMIN 2.4* 2.2* 2.3* 2.3* 2.2* 2.1* 2.0*   CBC:  Recent Labs Lab 08/27/14 1910 08/28/14 0450 08/29/14 0510 08/30/14 0556 09/01/14 0533  WBC 8.9 9.2 8.3 9.6 6.3  NEUTROABS  --  6.9 6.8 7.4  --   HGB 10.5* 10.5* 10.1* 10.6* 10.1*  HCT 32.2* 31.5* 30.0* 31.2* 30.2*  MCV 87.7 87.0 85.5 86.4 84.6  PLT 114* 113* 120* 125* 149*    CBG:  Recent Labs Lab 08/27/14 1855  GLUCAP 132*    Recent Results (from the past 240 hour(s))  Blood Culture (routine x 2)     Status: None   Collection Time: 08/27/14  8:07 PM  Result Value Ref Range Status   Specimen Description BLOOD RIGHT HAND  Final   Special Requests BOTTLES DRAWN AEROBIC AND ANAEROBIC  Final   Culture NO GROWTH 5 DAYS  Final   Report Status 09/01/2014 FINAL  Final  Blood Culture (routine x 2)      Status: None   Collection Time: 08/27/14  8:37 PM  Result Value Ref Range Status   Specimen Description BLOOD RIGHT ARM  Final   Special Requests BOTTLES DRAWN AEROBIC AND ANAEROBIC  Final   Culture NO GROWTH 5 DAYS  Final   Report Status 09/01/2014 FINAL  Final  Urine culture     Status: None   Collection Time: 08/27/14  8:50 PM  Result  Value Ref Range Status   Specimen Description URINE, CATHETERIZED  Final   Special Requests NONE  Final   Culture NO GROWTH 2 DAYS  Final   Report Status 08/29/2014 FINAL  Final  Stool culture     Status: None   Collection Time: 08/28/14  2:06 AM  Result Value Ref Range Status   Specimen Description STOOL  Final   Special Requests NONE  Final   Culture   Final    NO SALMONELLA, SHIGELLA, CAMPYLOBACTER, YERSINIA, OR E.COLI 0157:H7 ISOLATED Performed at [employer] [facility]    Report Status 08/31/2014 FINAL  Final  Clostridium Difficile by PCR (not at [facility])     Status: Abnormal   Collection Time: 08/28/14 12:00 PM  Result Value Ref Range Status   Toxigenic C Difficile by pcr POSITIVE (A) NEGATIVE Final    Comment: CRITICAL RESULT CALLED TO, READ BACK BY AND VERIFIED WITH: Tora Perches RN 13:05 08/28/14 ([email or web address])     Scheduled Meds: . amLODipine  10 mg Oral Daily  . aspirin  81 mg Oral Daily  . atorvastatin  20 mg Oral q1800  . calcitRIOL  0.25 mcg Oral Daily  . calcium acetate  667 mg Oral TID WC  . carvedilol  18.75 mg Oral BID WC  . famotidine  20 mg Oral QHS  . feeding supplement (NEPRO CARB STEADY)  237 mL Oral BID BM  . hydrALAZINE  10 mg Oral 3 times per day  . isosorbide mononitrate  60 mg Oral Daily  . saccharomyces boulardii  250 mg Oral BID  . sodium bicarbonate  650 mg Oral BID  . sodium chloride  3 mL Intravenous Q12H  . thiamine  100 mg Oral q morning - 10a  . vancomycin  125 mg Oral QID   Followed by  . [START ON 09/11/2014] vancomycin  125 mg Oral BID   Followed by  . [START ON 09/19/2014] vancomycin  125 mg Oral Daily     Followed by  . [START ON 09/26/2014] vancomycin  125 mg Oral QODAY   Followed by  . [START ON 10/04/2014] vancomycin  125 mg Oral Q3 days  . warfarin  2 mg Oral ONCE-1800  . Warfarin - Pharmacist Dosing Inpatient   Does not apply q1800   Continuous Infusions:    Pamella Pert, MD [facility] Pager [phone]. If 7 PM - 7 AM, please contact night-coverage at [email or web address], password [facility] 09/01/2014, 3:23 PM  LOS: 5 days

## 2014-09-01 NOTE — Progress Notes (Signed)
Subjective:  Says he feels better- waiting to eat- 750 uop- creatinine stable but poor.  Sister in the room  Objective Vital signs in last 24 hours: Filed Vitals:   08/31/14 0842 08/31/14 1645 08/31/14 2100 09/01/14 0500  BP: 174/70 160/76 158/71 179/83  Pulse: 69 69 72 74  Temp: 98.4 F (36.9 C) 97.6 F (36.4 C) 98 F (36.7 C) 98.7 F (37.1 C)  TempSrc: Oral Oral Oral Oral  Resp: 17 18 18 16   Height:      Weight:   52.663 kg (116 lb 1.6 oz)   SpO2: 97% 96% 94% 93%   Weight change: 0.953 kg (2 lb 1.6 oz)  Intake/Output Summary (Last 24 hours) at 09/01/14 1016 Last data filed at 09/01/14 0500  Gross per 24 hour  Intake    480 ml  Output    851 ml  Net   -371 ml    Assessment/ Plan: Pt is a 59 y.o. yo male who was admitted on 08/27/2014 with a fever and decreased mentation- found to be C diff positive but also worsening renal function from baseline  Assessment/Plan: 1. Renal- advanced CKD as OP- has mature AVF in place.  He tells me that discussions have not been held as an OP to start HD, he also tells me that he is feeling better Renal function is stable but poor. There are no absolute acute indications to start HD however, with low albumin and some FTT picture I took this opportunity to speak with the patient and his sister telling them that starting on HD may help him to feel better and help his body deal with infections in his colon and foot.   They both are in agreement to go ahead and start.  First tx today, second tomorrow  2. HTN/vol- with elevated BP-  do not think is dry- IVF stopped-  continue amlodipine/coreg and hydralazine 3. Anemia- iron is OK- hgb over 10- no need for ESA right now 4. Secondary hyperparathyroidism- on phoslo/renvela and calcitriol - will actually stop renvela since calcium can tolerate phoslo  5. C diff- on oral vanc- seems improved  6. Possible heparin allergy >? - since heparin is an integral part of dialysis therapy - will challenge with some and  treat any reaction that may  Occur- hopefully will tolerate    Paulina Muchmore A    Labs: Basic Metabolic Panel:  Recent Labs Lab 08/30/14 0556 08/31/14 0525 09/01/14 0533  NA 138 136 136  K 3.6 3.4* 3.4*  CL 109 107 105  CO2 18* 22 20*  GLUCOSE 130* 197* 145*  BUN 82* 80* 86*  CREATININE 7.88* 7.58* 7.57*  CALCIUM 8.6* 8.4* 8.2*  PHOS 3.5 2.9 3.0   Liver Function Tests:  Recent Labs Lab 08/28/14 0450  08/29/14 0510 08/30/14 0556 08/31/14 0525 09/01/14 0533  AST 26  --  22 22  --   --   ALT 24  --  21 19  --   --   ALKPHOS 72  --  76 72  --   --   BILITOT 0.8  --  0.6 0.7  --   --   PROT 6.8  --  6.8 7.1  --   --   ALBUMIN 2.2*  < > 2.3* 2.2* 2.1* 2.0*  < > = values in this interval not displayed. No results for input(s): LIPASE, AMYLASE in the last 168 hours. No results for input(s): AMMONIA in the last 168 hours. CBC:  Recent Labs  Lab 08/27/14 1910 08/28/14 0450 08/29/14 0510 08/30/14 0556 09/01/14 0533  WBC 8.9 9.2 8.3 9.6 6.3  NEUTROABS  --  6.9 6.8 7.4  --   HGB 10.5* 10.5* 10.1* 10.6* 10.1*  HCT 32.2* 31.5* 30.0* 31.2* 30.2*  MCV 87.7 87.0 85.5 86.4 84.6  PLT 114* 113* 120* 125* 149*   Cardiac Enzymes: No results for input(s): CKTOTAL, CKMB, CKMBINDEX, TROPONINI in the last 168 hours. CBG:  Recent Labs Lab 08/27/14 1855  GLUCAP 132*    Iron Studies: No results for input(s): IRON, TIBC, TRANSFERRIN, FERRITIN in the last 72 hours. Studies/Results: Dg Foot 2 Views Right  08/31/2014   CLINICAL DATA:  Wound abscess for several weeks.  EXAM: RIGHT FOOT - 2 VIEW  COMPARISON:  None.  FINDINGS: The joint spaces are maintained. Moderate osteoporosis and mild degenerative changes but no destructive bony changes to suggest osteomyelitis. No gas is seen in the soft tissues. There are extensive small vessel calcifications.  IMPRESSION: No definite plain film findings for osteomyelitis.   Electronically Signed   By: Rudie Meyer M.D.   On: 08/31/2014  15:11   Medications: Infusions:    Scheduled Medications: . amLODipine  10 mg Oral Daily  . aspirin  81 mg Oral Daily  . atorvastatin  20 mg Oral q1800  . calcitRIOL  0.25 mcg Oral Daily  . calcium acetate  667 mg Oral TID WC  . carvedilol  18.75 mg Oral BID WC  . famotidine  20 mg Oral QHS  . feeding supplement (NEPRO CARB STEADY)  237 mL Oral BID BM  . furosemide  160 mg Oral BID  . hydrALAZINE  10 mg Oral 3 times per day  . isosorbide mononitrate  60 mg Oral Daily  . saccharomyces boulardii  250 mg Oral BID  . sevelamer carbonate  1,600 mg Oral TID WC  . sodium bicarbonate  650 mg Oral BID  . sodium chloride  3 mL Intravenous Q12H  . thiamine  100 mg Oral q morning - 10a  . vancomycin  125 mg Oral QID   Followed by  . [START ON 09/11/2014] vancomycin  125 mg Oral BID   Followed by  . [START ON 09/19/2014] vancomycin  125 mg Oral Daily   Followed by  . [START ON 09/26/2014] vancomycin  125 mg Oral QODAY   Followed by  . [START ON 10/04/2014] vancomycin  125 mg Oral Q3 days  . Warfarin - Pharmacist Dosing Inpatient   Does not apply q1800    have reviewed scheduled and prn medications.  Physical Exam: General: NAD Heart: RRR Lungs: mostly clear Abdomen: soft, non tender Extremities: no edema Dialysis Access: left upper arm AVF with good thrill and bruit     09/01/2014,10:16 AM  LOS: 5 days

## 2014-09-01 NOTE — Care Management Important Message (Signed)
Important Message  Patient Details  Name: [name] [name] MRN: [id number] Date of Birth: 05-14-1955   Medicare Important Message Given:  Yes-second notification given    Orson Aloe 09/01/2014, 3:03 PM

## 2014-09-01 NOTE — Clinical Documentation Improvement (Signed)
MD's, NP's, and PA's  Documentation of "heart failure" please provide greater specificity regarding "type" and "acuity" if pertains to this case.  Thank you    Possible Clinical Conditions?   Chronic Diastolic Congestive Heart Failure  Acute Diastolic Congestive Heart Failure  Acute on Chronic Diastolic Congestive Heart Failure  Other Condition Cannot Clinically Determine    Risk Factors:C diff Colitis, Sepsis, CKD V, DM II, FOOT ULCERS   Diagnostics: Chest xray  Treatment: coreg , lasix     Thank You, [name] [name] ,RN Clinical Documentation Specialist:  [phone]  [facility]- Health Information Management

## 2014-09-01 NOTE — Progress Notes (Signed)
Physical Therapy Treatment Patient Details Name: [name] [name] MRN: [id number] DOB: 11-30-1955 Today's Date: 09/01/2014    History of Present Illness Patient is a 59 yo male admitted 08/27/14 with fever and diarrhea.  Patient with C-diff and acute on chronic renal failure.  PMH:  CKD, HTN, seizures, DM, CVA - LLE paresis    PT Comments    Noting better activity tolerance with incr distance ambulated; Pt mentioned he has a specific brace/arch support in his L shoe -- asked him to see if a family member can bring it in for our next session  Follow Up Recommendations  No PT follow up;Supervision for mobility/OOB     Equipment Recommendations  Rolling walker with 5" wheels;Other (comment) (Pt indicated he has a SW; a RW will be much more efficient)    Recommendations for Other Services       Precautions / Restrictions Precautions Precautions: Fall    Mobility  Bed Mobility                  Transfers Overall transfer level: Needs assistance Equipment used: Rolling walker (2 wheeled) Transfers: Sit to/from Stand Sit to Stand: Min guard         General transfer comment: Minguard assist without physical contact for safety  Ambulation/Gait Ambulation/Gait assistance: Min guard Ambulation Distance (Feet): 120 Feet Assistive device: Rolling walker (2 wheeled) Gait Pattern/deviations: Step-through pattern;Decreased dorsiflexion - left;Trunk flexed Gait velocity: Decreased   General Gait Details: Adjusted RW height for more optimal fit; Cues to be very aware of LLE during swing as he has continued decr L dorsiflexion   Stairs            Wheelchair Mobility    Modified Rankin (Stroke Patients Only)       Balance Overall balance assessment: Needs assistance           Standing balance-Leahy Scale: Fair Standing balance comment: Stood at sink to wash hands in prep for hallway amb                    Cognition Arousal/Alertness:  Awake/alert Behavior During Therapy: WFL for tasks assessed/performed Overall Cognitive Status: Within Functional Limits for tasks assessed                      Exercises      General Comments General comments (skin integrity, edema, etc.): Incontinent of bowel upon entry to room      Pertinent Vitals/Pain Pain Assessment: No/denies pain    Home Living                      Prior Function            PT Goals (current goals can now be found in the care plan section) Acute Rehab PT Goals Patient Stated Goal: To return home PT Goal Formulation: With patient Time For Goal Achievement: 09/06/14 Potential to Achieve Goals: Good Progress towards PT goals: Progressing toward goals    Frequency  Min 3X/week    PT Plan Current plan remains appropriate    Co-evaluation             End of Session Equipment Utilized During Treatment: Gait belt Activity Tolerance: Patient tolerated treatment well Patient left: in bed;with call bell/phone within reach;with bed alarm set     Time: 7829-5621 PT Time Calculation (min) (ACUTE ONLY): 33 min  Charges:  $Gait Training: 8-22 mins $Therapeutic Activity: 8-22 mins  G Codes:      Van Clines [employer] [facility] 09/01/2014, 3:38 PM  Van Clines, [place]  Acute Rehabilitation Services Pager [phone] Office [phone]

## 2014-09-02 DIAGNOSIS — E43 Unspecified severe protein-calorie malnutrition: Secondary | ICD-10-CM

## 2014-09-02 DIAGNOSIS — A419 Sepsis, unspecified organism: Secondary | ICD-10-CM | POA: Diagnosis present

## 2014-09-02 LAB — CBC
HCT: 26.7 % — ABNORMAL LOW (ref 39.0–52.0)
Hemoglobin: 9.1 g/dL — ABNORMAL LOW (ref 13.0–17.0)
MCH: 28.7 pg (ref 26.0–34.0)
MCHC: 34.1 g/dL (ref 30.0–36.0)
MCV: 84.2 fL (ref 78.0–100.0)
Platelets: 103 10*3/uL — ABNORMAL LOW (ref 150–400)
RBC: 3.17 MIL/uL — AB (ref 4.22–5.81)
RDW: 14.5 % (ref 11.5–15.5)
WBC: 2.8 10*3/uL — ABNORMAL LOW (ref 4.0–10.5)

## 2014-09-02 LAB — RENAL FUNCTION PANEL
ALBUMIN: 2 g/dL — AB (ref 3.5–5.0)
Anion gap: 7 (ref 5–15)
BUN: 44 mg/dL — AB (ref 6–20)
CHLORIDE: 105 mmol/L (ref 101–111)
CO2: 25 mmol/L (ref 22–32)
Calcium: 7.7 mg/dL — ABNORMAL LOW (ref 8.9–10.3)
Creatinine, Ser: 5.09 mg/dL — ABNORMAL HIGH (ref 0.61–1.24)
GFR, EST AFRICAN AMERICAN: 13 mL/min — AB (ref >60)
GFR, EST NON AFRICAN AMERICAN: 11 mL/min — AB (ref >60)
GLUCOSE: 241 mg/dL — AB (ref 65–99)
POTASSIUM: 3.7 mmol/L (ref 3.5–5.1)
Phosphorus: 2.3 mg/dL — ABNORMAL LOW (ref 2.5–4.6)
Sodium: 137 mmol/L (ref 135–145)

## 2014-09-02 LAB — PROTIME-INR
INR: 2.47 — ABNORMAL HIGH (ref 0.00–1.49)
PROTHROMBIN TIME: 26.4 s — AB (ref 11.6–15.2)

## 2014-09-02 LAB — HEPATITIS B CORE ANTIBODY, TOTAL: HEP B C TOTAL AB: NEGATIVE

## 2014-09-02 LAB — HEPATITIS B SURFACE ANTIBODY,QUALITATIVE: HEP B S AB: REACTIVE

## 2014-09-02 MED ORDER — FIRST-VANCOMYCIN 50 50 MG/ML PO SOLN: 125.0000 mg | Freq: Four times a day (QID) | ORAL | Status: AC

## 2014-09-02 MED ORDER — WARFARIN SODIUM 2 MG PO TABS
2.0000 mg | ORAL_TABLET | Freq: Once | ORAL | Status: AC
  Administered 2014-09-02: 2 mg via ORAL
  Filled 2014-09-02 (×2): qty 1

## 2014-09-02 MED ORDER — NEPRO/CARBSTEADY PO LIQD: 237.0000 mL | Freq: Two times a day (BID) | ORAL | Status: AC

## 2014-09-02 MED ORDER — HYDRALAZINE HCL 10 MG PO TABS: 10.0000 mg | ORAL_TABLET | Freq: Three times a day (TID) | ORAL | Status: AC

## 2014-09-02 NOTE — Discharge Summary (Signed)
Physician Discharge Summary  [name] [name] ZOX:[id number] DOB: 1955-08-10 DOA: 08/27/2014  PCP: Lonia Blood, MD  Admit date: 08/27/2014 Discharge date: 09/02/2014  Time spent: > 40 minutes  Recommendations for Outpatient Follow-up:  1. Follow up with Dr. Mikeal Hawthorne in 1 month 2. Follow up with De. Deterding as scheduled 3. Discharged to [facility] 4. Continue HD, next HD due this Friday 8/5 5. Continue vancomycin taper as below, 125 mg every 6 h For 5 days then 125 mg every 12 h for 7 days then 125 mg daily for 7 days then 125 mg every other day for 7 days then 125 mg every 3 days for 14 days.    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 male with a past medical history of hypertension, CHF, seizures type 2 diabetes, hep C, seizures, stage IV CKD who recently had a fistula placed anticipating future hemodialysis and who was brought to the ER via EMS due to fever and mildly decreased mentation at home per relatives. Apparently the patient has been complaining that he was cold inside the house and went out to his porch and sat down for a while until family members noticed that he was febrile and mildly altered. Then they decided to call EMS and bring the patient to the ER. He is currently in no acute distress, but does not remember much of the details of what happened.    Hospital Course:  Acute on Chronic Kidney Disease, Stage 4-5 - Possibly due to dehydration from fluid loss due to persistent diarrhea, nephrology was consulted and have followed patient while hospitalized, he was started on hemodialysis and received 2 treatments on 8/2 and 8/3 with plans for third treatment this Friday.  Right foot wound - opened up and started draining on 8/1,  wound care consulted, appreciate wound care plan with local antibiotics, plain films negative for air or bone involvement, hold systemic antibiotics for now given C diff colitis, does not appear overly infected Sepsis due to C diff colitis - C. difficile tested positive, he has recurrent episode and patient was started on po vancomycin taper, he will need 125 mg every 6 h for 5 days then 125 mg every 12 h for 7 days then 125 mg daily for 7 days then 125 mg every other day for 7 days then 125 mg every 3 days for 14 days. His diarrhea has resolved and has formed stools, he does not have any abdominal pain and is eating well without nausea or vomiting.  - Given sepsis, he was initially started on IV vanc/zosyn 08/27/14. These were discontinued on 08/27/14 due to stool being C.Diff positive.  - CXR negative for pneumonia, UA negative for infection,  Hypertension - On amlodipine, carvedilol, hydralazine Type 2 Diabetes Mellitus - Insulin dependent, Monitor CBG History of CHF -  unable to clinically determine what type of CHF, no prior echoes to support this in our system. Currently stable, Echo on 09/12/13: LVEF 55-60%, On lasix and imdur, follows with Dr. Jacinto Halim as an outpatient. Hyperlipidemia - On atorvastatin History of CVA - On aspirin, On warfarin per pharmacy Anemia / Thrombocytopenia - likely in the setting of chronic disease, with sepsis picture on admission. This is stable, no evidence of bleeding. Continue to monitor.   Procedures:  None    Consultations:  Nephrology   Discharge Exam: Filed Vitals:   09/02/14  0910 09/02/14 0940 09/02/14 1010 09/02/14 1015  BP: 155/75 155/83 160/81 173/82  Pulse: 66 66 66 69  Temp:      TempSrc:      Resp: 17 18 18 18   Height:      Weight:    51 kg (112 lb 7 oz)  SpO2:       General: NAD Cardiovascular: RRR Respiratory: CTA biL  Discharge Instructions    Medication List    STOP taking these medications        acetaminophen 325 MG tablet  Commonly known as:  TYLENOL     enoxaparin 60 MG/0.6ML injection  Commonly known as:  LOVENOX     ranitidine 150 MG tablet  Commonly known as:  ZANTAC     vancomycin 50 mg/mL oral solution  Commonly known as:  VANCOCIN  Replaced by:  FIRST-VANCOMYCIN 50 50 MG/ML Soln      TAKE these medications        amLODipine 10 MG tablet  Commonly known as:  NORVASC  Take 10 mg by mouth daily.     aspirin 81 MG tablet  Take 81 mg by mouth daily.     atorvastatin 20 MG tablet  Commonly known as:  LIPITOR  Take 1 tablet (20 mg total) by mouth daily at 6 PM.     calcitRIOL 0.25 MCG capsule  Commonly known as:  ROCALTROL  Take 1 capsule (0.25 mcg total) by mouth daily.     calcium acetate 667 MG capsule  Commonly known as:  PHOSLO  Take 667 mg by mouth 3 (three) times daily with meals.     carvedilol 6.25 MG tablet  Commonly known as:  COREG  Take 3 tablets (18.75 mg total) by mouth 2 (two) times daily with a meal.     feeding supplement (NEPRO CARB STEADY) Liqd  Take 237 mLs by mouth 3 (three) times daily between meals.     feeding supplement (NEPRO CARB STEADY) Liqd  Take 237 mLs by mouth 2 (two) times daily between meals.     FIRST-VANCOMYCIN 50 50 MG/ML Soln  Take 125 mg by mouth every 6 (six) hours. 125 mg every 6 h For 7 days then 125 mg every 12 h for 7 days then 125 mg daily for 7 days then 125 mg every other day for 7 days then 125 mg every 3 days for 14 days.     furosemide 80 MG tablet  Commonly known as:  LASIX  Take 2 tablets (160 mg total) by mouth 2 (two) times daily.     glucose blood  test strip  Use as instructed     hydrALAZINE 10 MG tablet  Commonly known as:  APRESOLINE  Take 1 tablet (10 mg total) by mouth every 8 (eight) hours.     insulin aspart 100 UNIT/ML injection  Commonly known as:  novoLOG  Inject 0-12 Units into the skin 3 (three)  times daily with meals. SSI  BS  150-200  2 u    To be given SQ                 201-250  4 u                 251-300  6 u                 301-350  8 u                  351-400  10 u                   > 400  12 u and call MD     insulin glargine 100 UNIT/ML injection  Commonly known as:  LANTUS  Inject 0.05 mLs (5 Units total) into the skin at bedtime.     isosorbide mononitrate 60 MG 24 hr tablet  Commonly known as:  IMDUR  Take 1 tablet (60 mg total) by mouth daily.     saccharomyces boulardii 250 MG capsule  Commonly known as:  FLORASTOR  Take 1 capsule (250 mg total) by mouth 2 (two) times daily.     sevelamer carbonate 800 MG tablet  Commonly known as:  RENVELA  Take 1,600 mg by mouth 3 (three) times daily with meals.     sodium bicarbonate 650 MG tablet  Take 2 tablets (1,300 mg total) by mouth 2 (two) times daily.     thiamine 100 MG tablet  Commonly known as:  VITAMIN B-1  Take 100 mg by mouth every morning.     warfarin 2 MG tablet  Commonly known as:  COUMADIN  Take 2 mg by mouth daily.           Follow-up Information    Follow up with [facility], [place]. Schedule an appointment as soon as possible for a visit in 2 weeks.   Specialty:  Internal Medicine   Contact information:   [address] [place] [address] [phone]       The results of significant diagnostics from this hospitalization (including imaging, microbiology, ancillary and laboratory) are listed below for reference.    Significant Diagnostic Studies: Dg Chest Port 1 View  08/27/2014   CLINICAL DATA:  Heat exposure, lightheadedness  EXAM: PORTABLE CHEST - 1 VIEW  COMPARISON:  January 17, 2014  FINDINGS: The heart size and  mediastinal contours are stable. The heart size is enlarged. There is a right pleural effusion with consolidation of right lung base chronic compared prior exam. The left lung is clear. There is no pulmonary edema. The visualized skeletal structures are unremarkable.  IMPRESSION: Chronic right pleural effusion with consolidation of right lung base unchanged. Cardiomegaly.   Electronically Signed   By: Sherian Rein M.D.   On: 08/27/2014 19:30   Dg Foot 2 Views Right  08/31/2014   CLINICAL DATA:  Wound abscess for several weeks.  EXAM: RIGHT FOOT - 2 VIEW  COMPARISON:  None.  FINDINGS: The joint spaces are maintained. Moderate osteoporosis and mild degenerative changes but no destructive bony changes to suggest osteomyelitis. No gas is seen in the soft tissues. There are extensive small vessel calcifications.  IMPRESSION: No definite plain film findings for osteomyelitis.   Electronically Signed   By: Rudie Meyer M.D.   On: 08/31/2014 15:11    Microbiology: Recent Results (from the past 240 hour(s))  Blood Culture (routine x 2)  Status: None   Collection Time: 08/27/14  8:07 PM  Result Value Ref Range Status   Specimen Description BLOOD RIGHT HAND  Final   Special Requests BOTTLES DRAWN AEROBIC AND ANAEROBIC  Final   Culture NO GROWTH 5 DAYS  Final   Report Status 09/01/2014 FINAL  Final  Blood Culture (routine x 2)     Status: None   Collection Time: 08/27/14  8:37 PM  Result Value Ref Range Status   Specimen Description BLOOD RIGHT ARM  Final   Special Requests BOTTLES DRAWN AEROBIC AND ANAEROBIC  Final   Culture NO GROWTH 5 DAYS  Final   Report Status 09/01/2014 FINAL  Final  Urine culture     Status: None   Collection Time: 08/27/14  8:50 PM  Result Value Ref Range Status   Specimen Description URINE, CATHETERIZED  Final   Special Requests NONE  Final   Culture NO GROWTH 2 DAYS  Final   Report Status 08/29/2014 FINAL  Final  Stool culture     Status: None   Collection  Time: 08/28/14  2:06 AM  Result Value Ref Range Status   Specimen Description STOOL  Final   Special Requests NONE  Final   Culture   Final    NO SALMONELLA, SHIGELLA, CAMPYLOBACTER, YERSINIA, OR E.COLI 0157:H7 ISOLATED Performed at [facility]    Report Status 08/31/2014 FINAL  Final  Clostridium Difficile by PCR (not at [facility])     Status: Abnormal   Collection Time: 08/28/14 12:00 PM  Result Value Ref Range Status   Toxigenic C Difficile by pcr POSITIVE (A) NEGATIVE Final    Comment: CRITICAL RESULT CALLED TO, READ BACK BY AND VERIFIED WITH: ACory Roughen RN 13:05 08/28/14 ([email or web address])      Labs: Basic Metabolic Panel:  Recent Labs Lab 08/27/14 1910 08/28/14 0450 08/29/14 0500 08/29/14 0510 08/30/14 0556 08/31/14 0525 09/01/14 0533 09/02/14 0530  NA 138 138 137 139 138 136 136 137  K 4.3 4.0 3.8 3.8 3.6 3.4* 3.4* 3.7  CL 115* 118* 112* 114* 109 107 105 105  CO2 13* 15* 17* 17* 18* 22 20* 25  GLUCOSE 128* 150* 158* 157* 130* 197* 145* 241*  BUN 90* 87* 84* 83* 82* 80* 86* 44*  CREATININE 7.86* 7.94* 7.52* 7.66* 7.88* 7.58* 7.57* 5.09*  CALCIUM 8.7* 8.2* 8.4* 8.4* 8.6* 8.4* 8.2* 7.7*  PHOS  --   --  3.2  --  3.5 2.9 3.0 2.3*   Liver Function Tests:  Recent Labs Lab 08/27/14 1910 08/28/14 0450  08/29/14 0510 08/30/14 0556 08/31/14 0525 09/01/14 0533 09/01/14 1600 09/02/14 0530  AST 29 26  --  22 22  --   --   --   --   ALT 25 24  --  21 19  --   --  28  --   ALKPHOS 78 72  --  76 72  --   --   --   --   BILITOT 0.6 0.8  --  0.6 0.7  --   --   --   --   PROT 6.7 6.8  --  6.8 7.1  --   --   --   --   ALBUMIN 2.4* 2.2*  < > 2.3* 2.2* 2.1* 2.0*  --  2.0*  < > = values in this interval not displayed. CBC:  Recent Labs Lab 08/28/14 0450 08/29/14 0510 08/30/14 0556 09/01/14 0533 09/02/14 0726  WBC 9.2 8.3 9.6  6.3 2.8*  NEUTROABS 6.9 6.8 7.4  --   --   HGB 10.5* 10.1* 10.6* 10.1* 9.1*  HCT 31.5* 30.0* 31.2* 30.2* 26.7*  MCV 87.0 85.5 86.4 84.6 84.2    PLT 113* 120* 125* 149* 103*    CBG:  Recent Labs Lab 08/27/14 1855  GLUCAP 132*     Signed:  Darlina Mccaughey  [facility] 09/02/2014, 2:38 PM

## 2014-09-02 NOTE — Procedures (Signed)
Patient was seen on dialysis and the procedure was supervised.  BFR 250  Via AVF BP is  155/83.   Patient appears to be tolerating treatment well  [name] [name] A 09/02/2014

## 2014-09-02 NOTE — Progress Notes (Signed)
[facility] transport picked pt up with his belongings and pt transported to [facility]. Pt's sister here at time of discharge.

## 2014-09-02 NOTE — Progress Notes (Addendum)
Nutrition Follow-up  DOCUMENTATION CODES:   Severe malnutrition in context of chronic illness, Underweight  INTERVENTION:   Continue Nepro Shake po BID, each supplement provides 425 kcal and 19 grams protein.  Encourage adequate PO intake.   Renal diet education given.  NUTRITION DIAGNOSIS:   Malnutrition related to chronic illness as evidenced by severe depletion of body fat, severe depletion of muscle mass, percent weight loss; ongoing  GOAL:   Patient will meet greater than or equal to 90% of their needs; progressing  MONITOR:   PO intake, Supplement acceptance, Weight trends, Labs, I & O's  REASON FOR ASSESSMENT:    (Low BMI)    ASSESSMENT:   59 y.o. male with a past medical history of hypertension, CHF, seizures type 2 diabetes, hep C, seizures, stage IV CKD who recently had a fistula placed anticipating future hemodialysis and who was brought to the ER via EMS due to fever and mildly decreased mentation at home per relatives. Pt started on HD.   Appetite reported good. Pt requesting double portions at meals as he reports it is not enough and was getting upset. RD to make note in [facility]. Meal completion has been 50-100%. Pt has been consuming his Nepro Shakes. RD to continue with orders. RD additionally consulted for a renal diet education.   RD consulted 1300 to aid in double portions dilemma at meals. RD has given note in [facility], which the kitchen reads, on sending double portion protein at meals.     Potassium and phosphorous WNL.   Diet Order:  Diet renal/carb modified with fluid restriction Diet-HS Snack?: Nothing; Room service appropriate?: Yes; Fluid consistency:: Thin  Skin:  Wound (see comment) (Diabetic ulcer on R foot)  Last BM:  8/2  Height:   Ht Readings from Last 1 Encounters:  08/28/14  (1.702 m)    Weight:   Wt Readings from Last 1 Encounters:  09/02/14 112 lb 7 oz (51 kg)    Ideal Body Weight:  67 kg  BMI:  Body mass  index is 17.61 kg/(m^2).  Estimated Nutritional Needs:   Kcal:  1850-2100  Protein:  75-90 grams  Fluid:  1.2 L/day  EDUCATION NEEDS:   No education needs identified at this time  Roslyn Smiling, MS, RD, LDN Pager # [phone] After hours/ weekend pager # [phone]

## 2014-09-02 NOTE — Plan of Care (Signed)
Problem: Food- and Nutrition-Related Knowledge Deficit (NB-1.1) Goal: Nutrition education Formal process to instruct or train a patient/client in a skill or to impart knowledge to help patients/clients voluntarily manage or modify food choices and eating behavior to maintain or improve health.  Outcome: Completed/Met Date Met:  09/02/14 Nutrition Education Note  RD consulted for Renal Education. Provided "Eating Healthy with Kidney Disease" handout to patient/family. Reviewed food groups and provided written recommended serving sizes specifically determined for patient's current nutritional status.   Explained why diet restrictions are needed and provided lists of foods to limit/avoid that are high potassium, sodium, and phosphorus. Provided specific recommendations on safer alternatives of these foods. Strongly encouraged compliance of this diet.   Discussed importance of protein intake at each meal and snack. Provided examples of how to maximize protein intake throughout the day. Discussed need for fluid restriction with dialysis and renal-friendly beverage options. Teach back method used.  Expect fair compliance.  Corrin Parker, MS, RD, LDN Pager # [phone] After hours/ weekend pager # [phone]

## 2014-09-02 NOTE — Progress Notes (Signed)
Subjective:  Says he feels better- wants double portions for his meals- seen on second treatment today   Objective Vital signs in last 24 hours: Filed Vitals:   09/02/14 0810 09/02/14 0840 09/02/14 0910 09/02/14 0940  BP: 145/75 155/80 155/75 155/83  Pulse: 65 65 66 66  Temp:      TempSrc:      Resp: 18 18 17 18   Height:      Weight:      SpO2:       Weight change: -0.962 kg (-2 lb 2 oz)  Intake/Output Summary (Last 24 hours) at 09/02/14 1018 Last data filed at 09/02/14 0500  Gross per 24 hour  Intake      0 ml  Output    450 ml  Net   -450 ml    Assessment/ Plan: Pt is a 59 y.o. yo male who was admitted on 08/27/2014 with a fever and decreased mentation- found to be C diff positive but also worsening renal function from baseline  Assessment/Plan: 1. Renal- advanced CKD as OP- with mature AVF in place.   There were no absolute acute indications to start HD however, with low albumin and some FTT picture I took this opportunity to speak with the patient and his sister telling them that starting on HD may help him to feel better and help his body deal with infections in his colon and foot.   They were both are in agreement to go ahead and start.  First tx 8/2, second today.  AVF holding up well- will plan for third treatment on Friday.  Getting arranged for OP HD  2. HTN/vol- with elevated BP-  do not think is dry- IVF stopped-  continue amlodipine/coreg and hydralazine and volume control with HD 3. Anemia- iron is OK- hgb falling- will add aranesp  4. Secondary hyperparathyroidism- on phoslo/renvela and calcitriol -  stoped renvela since calcium can tolerate phoslo - wants double portions so suspect phos will go up  5. C diff- on oral vanc- seems improved  6. Possible heparin allergy ? -tolerated OK - can continue using heparin with HD  Meria Crilly A    Labs: Basic Metabolic Panel:  Recent Labs Lab 08/31/14 0525 09/01/14 0533 09/02/14 0530  NA 136 136 137  K 3.4*  3.4* 3.7  CL 107 105 105  CO2 22 20* 25  GLUCOSE 197* 145* 241*  BUN 80* 86* 44*  CREATININE 7.58* 7.57* 5.09*  CALCIUM 8.4* 8.2* 7.7*  PHOS 2.9 3.0 2.3*   Liver Function Tests:  Recent Labs Lab 08/28/14 0450  08/29/14 0510 08/30/14 0556 08/31/14 0525 09/01/14 0533 09/01/14 1600 09/02/14 0530  AST 26  --  22 22  --   --   --   --   ALT 24  --  21 19  --   --  28  --   ALKPHOS 72  --  76 72  --   --   --   --   BILITOT 0.8  --  0.6 0.7  --   --   --   --   PROT 6.8  --  6.8 7.1  --   --   --   --   ALBUMIN 2.2*  < > 2.3* 2.2* 2.1* 2.0*  --  2.0*  < > = values in this interval not displayed. No results for input(s): LIPASE, AMYLASE in the last 168 hours. No results for input(s): AMMONIA in the last 168 hours. CBC:  Recent  Labs Lab 08/28/14 0450 08/29/14 0510 08/30/14 0556 09/01/14 0533 09/02/14 0726  WBC 9.2 8.3 9.6 6.3 2.8*  NEUTROABS 6.9 6.8 7.4  --   --   HGB 10.5* 10.1* 10.6* 10.1* 9.1*  HCT 31.5* 30.0* 31.2* 30.2* 26.7*  MCV 87.0 85.5 86.4 84.6 84.2  PLT 113* 120* 125* 149* 103*   Cardiac Enzymes: No results for input(s): CKTOTAL, CKMB, CKMBINDEX, TROPONINI in the last 168 hours. CBG:  Recent Labs Lab 08/27/14 1855  GLUCAP 132*    Iron Studies: No results for input(s): IRON, TIBC, TRANSFERRIN, FERRITIN in the last 72 hours. Studies/Results: Dg Foot 2 Views Right  08/31/2014   CLINICAL DATA:  Wound abscess for several weeks.  EXAM: RIGHT FOOT - 2 VIEW  COMPARISON:  None.  FINDINGS: The joint spaces are maintained. Moderate osteoporosis and mild degenerative changes but no destructive bony changes to suggest osteomyelitis. No gas is seen in the soft tissues. There are extensive small vessel calcifications.  IMPRESSION: No definite plain film findings for osteomyelitis.   Electronically Signed   By: Rudie Meyer M.D.   On: 08/31/2014 15:11   Medications: Infusions:    Scheduled Medications: . amLODipine  10 mg Oral Daily  . aspirin  81 mg Oral Daily   . atorvastatin  20 mg Oral q1800  . calcitRIOL  0.25 mcg Oral Daily  . calcium acetate  667 mg Oral TID WC  . carvedilol  18.75 mg Oral BID WC  . famotidine  20 mg Oral QHS  . feeding supplement (NEPRO CARB STEADY)  237 mL Oral BID BM  . hydrALAZINE  10 mg Oral 3 times per day  . isosorbide mononitrate  60 mg Oral Daily  . saccharomyces boulardii  250 mg Oral BID  . sodium bicarbonate  650 mg Oral BID  . sodium chloride  3 mL Intravenous Q12H  . thiamine  100 mg Oral q morning - 10a  . vancomycin  125 mg Oral QID   Followed by  . [START ON 09/11/2014] vancomycin  125 mg Oral BID   Followed by  . [START ON 09/19/2014] vancomycin  125 mg Oral Daily   Followed by  . [START ON 09/26/2014] vancomycin  125 mg Oral QODAY   Followed by  . [START ON 10/04/2014] vancomycin  125 mg Oral Q3 days  . Warfarin - Pharmacist Dosing Inpatient   Does not apply q1800    have reviewed scheduled and prn medications.  Physical Exam: General: NAD Heart: RRR Lungs: mostly clear Abdomen: soft, non tender Extremities: no edema Dialysis Access: left upper arm AVF with good thrill and bruit     09/02/2014,10:18 AM  LOS: 6 days

## 2014-09-02 NOTE — Progress Notes (Signed)
Report complete to nurse at [facility], pt will be admitted to room 411 at [facility]. Pt and his family aware. Transportation to facility to be arranged by SW, unit charge nurse informing SW at this time of report called.

## 2014-09-02 NOTE — Progress Notes (Signed)
ANTICOAGULATION CONSULT NOTE - Follow-up  Pharmacy Consult for Coumadin Indication: DVT (history)  Allergies  Allergen Reactions  . Pork-Derived Conservation officer, nature and Other (See Comments)    The patient has tolerated lovenox  . Lactose Intolerance (Gi)     Patient Measurements: Height:  (170.2 cm) Weight: 112 lb 7 oz (51 kg) IBW/kg (Calculated) : 66.1   Vital Signs: Temp: 98.1 F (36.7 C) (08/03 0657) Temp Source: Oral (08/03 0657) BP: 173/82 mmHg (08/03 1015) Pulse Rate: 69 (08/03 1015)  Labs:  Recent Labs  08/31/14 0525 09/01/14 0533 09/02/14 0530 09/02/14 0726  HGB  --  10.1*  --  9.1*  HCT  --  30.2*  --  26.7*  PLT  --  149*  --  103*  LABPROT 30.5* 28.1* 26.4*  --   INR 2.99* 2.67* 2.47*  --   CREATININE 7.58* 7.57* 5.09*  --     Estimated Creatinine Clearance: 11.4 mL/min (by C-G formula based on Cr of 5.09).  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 PTA dose was 2 mg daily. INR today remains therapeutic (INR 2.47 << 2.67, goal of 2-3). Hgb/Hct slight drop today (may be related to starting HD) - no overt s/sx of bleeding noted.   The patient was re-educated on warfarin this admission.  Goal of Therapy:  INR 2-3   Plan:  1. Warfarin 2 mg x 1 dose at 1800 today 2. Will continue to monitor for any signs/symptoms of bleeding and will follow up with PT/INR in the a.m.  Georgina Pillion, PharmD, BCPS Clinical Pharmacist Pager: [phone] 09/02/2014 12:06 PM

## 2014-09-04 ENCOUNTER — Encounter (HOSPITAL_COMMUNITY): Payer: Medicare Other

## 2014-10-01 DEATH — deceased

## 2014-11-04 DIAGNOSIS — I10 Essential (primary) hypertension: Secondary | ICD-10-CM | POA: Insufficient documentation

## 2014-11-04 DIAGNOSIS — R55 Syncope and collapse: Secondary | ICD-10-CM

## 2014-11-11 ENCOUNTER — Ambulatory Visit: Payer: Self-pay | Admitting: Cardiology

## 2014-12-02 ENCOUNTER — Ambulatory Visit (INDEPENDENT_AMBULATORY_CARE_PROVIDER_SITE_OTHER): Payer: Medicare Other | Admitting: Ophthalmology

## 2015-01-04 ENCOUNTER — Telehealth: Payer: Self-pay | Admitting: Cardiology

## 2015-01-04 NOTE — Telephone Encounter (Signed)
LMOM FOR PT TO CALL BACK TO R/S HIS 54 M F/U THAT HE NS IN October.

## 2015-01-07 NOTE — Telephone Encounter (Signed)
Left 2nd message fro pt to call and resch NS from October or advise if he is being seen by another cardiologist.

## 2015-01-12 ENCOUNTER — Encounter: Payer: Self-pay | Admitting: Cardiology

## 2015-01-12 NOTE — Telephone Encounter (Signed)
Sent no show letter// done

## 2015-01-18 ENCOUNTER — Telehealth: Payer: Self-pay | Admitting: Cardiology

## 2015-01-18 NOTE — Telephone Encounter (Signed)
Per return mail and notes in [place] out of [place] - DONE

## 2015-11-26 IMAGING — US US RENAL
1 series · 14 of 23 positions shown · non-contrast
Comparison: None.

CLINICAL DATA: Elevated renal function studies ; end-stage renal
disease.

EXAM:
RENAL/URINARY TRACT ULTRASOUND COMPLETE

[Series 1: us renal · 0.20mm/px · 14 of 23 slices shown]
[im 1/23]
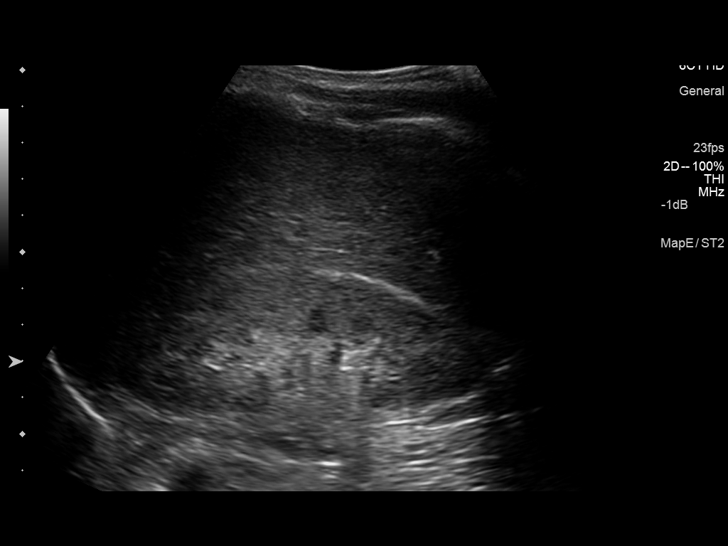
[im 3/23]
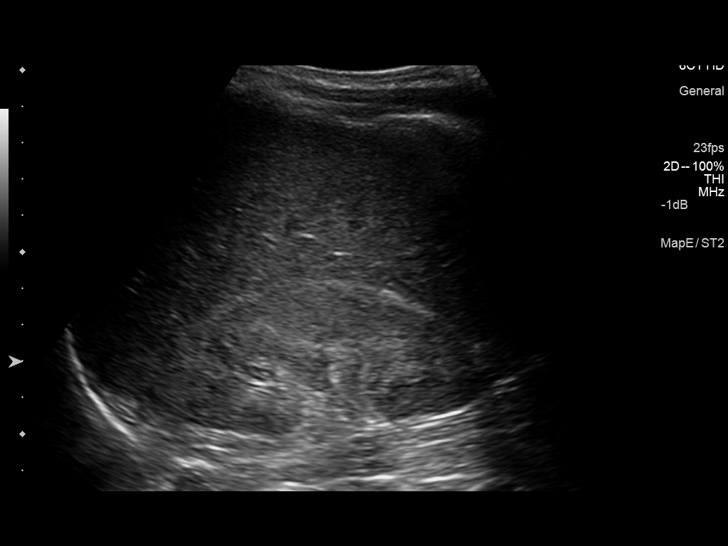
[im 5/23]
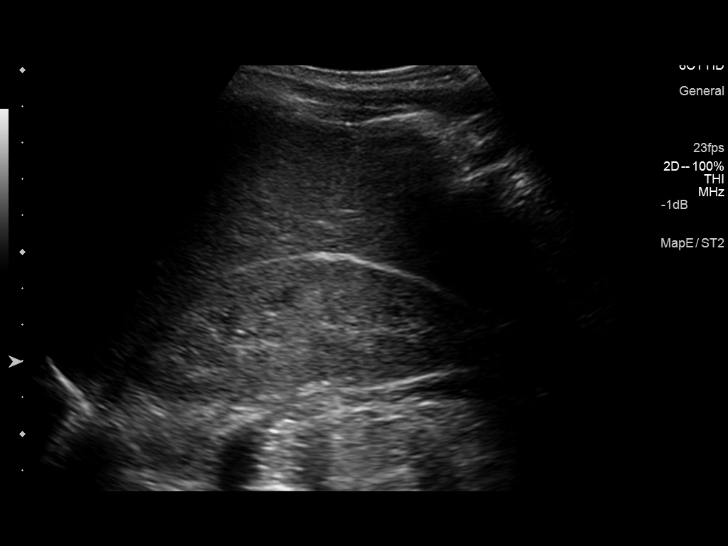
[im 6/23]
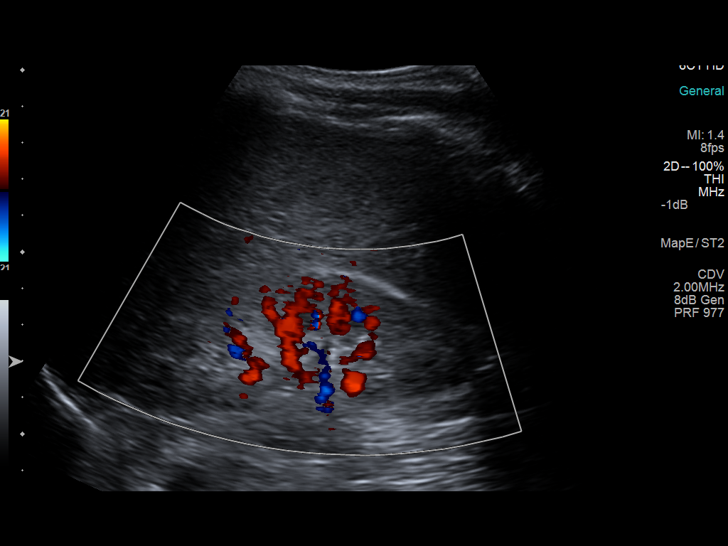
[im 8/23]
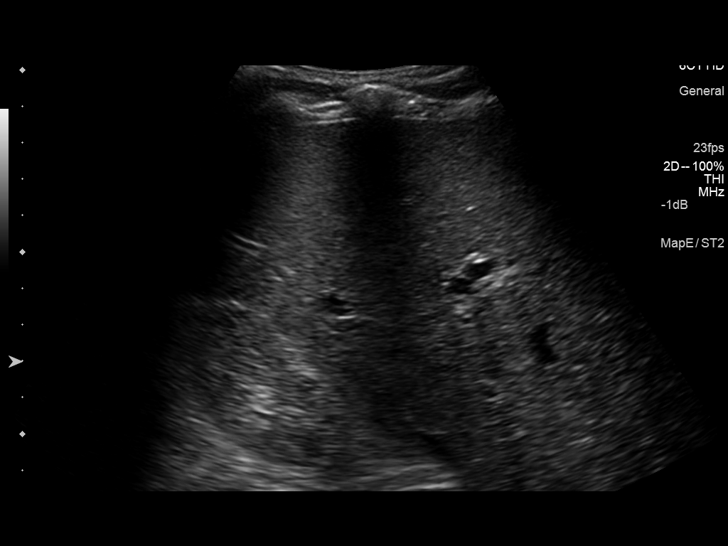
[im 10/23]
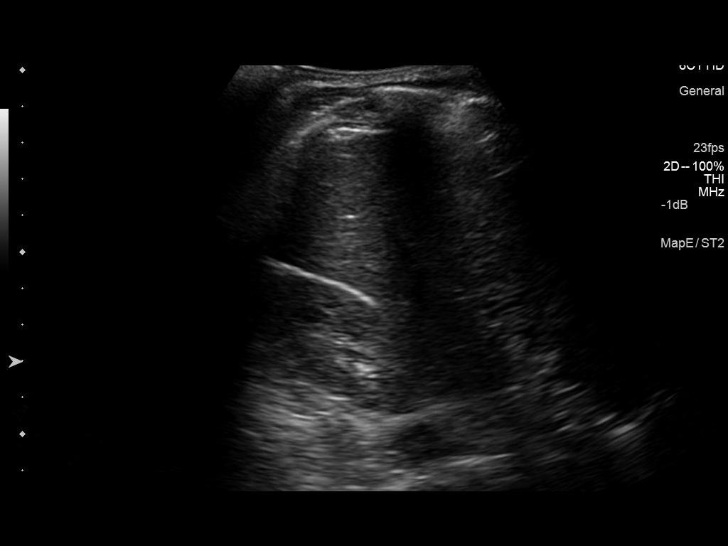
[im 11/23]
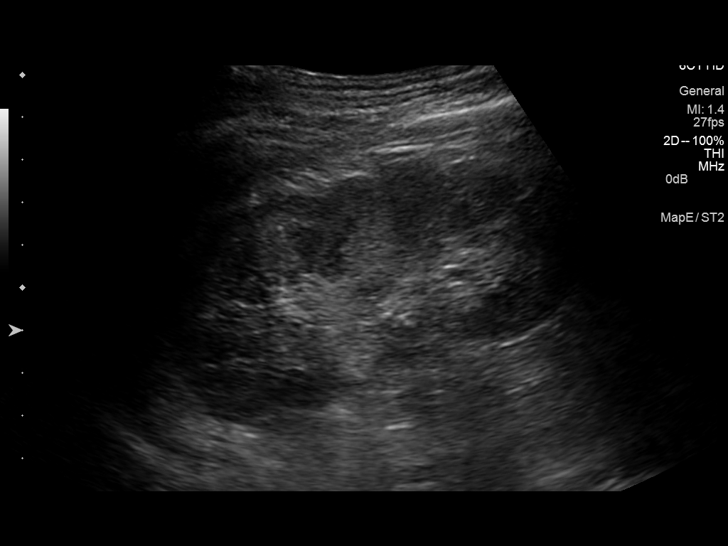
[im 13/23]
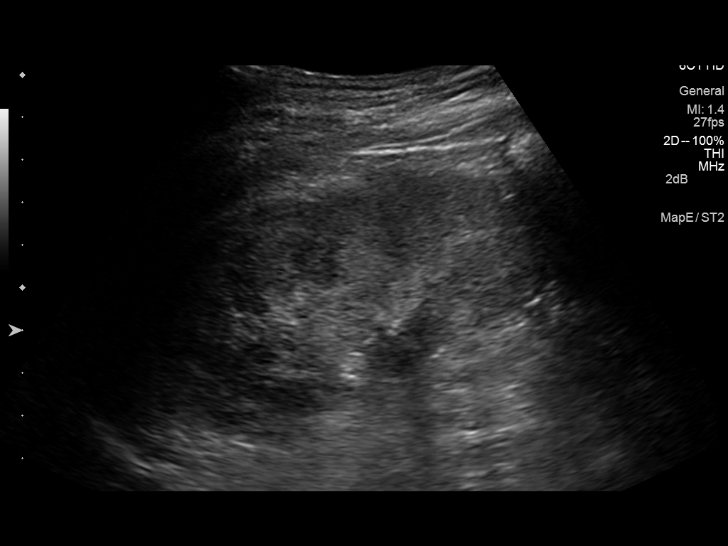
[im 14/23]
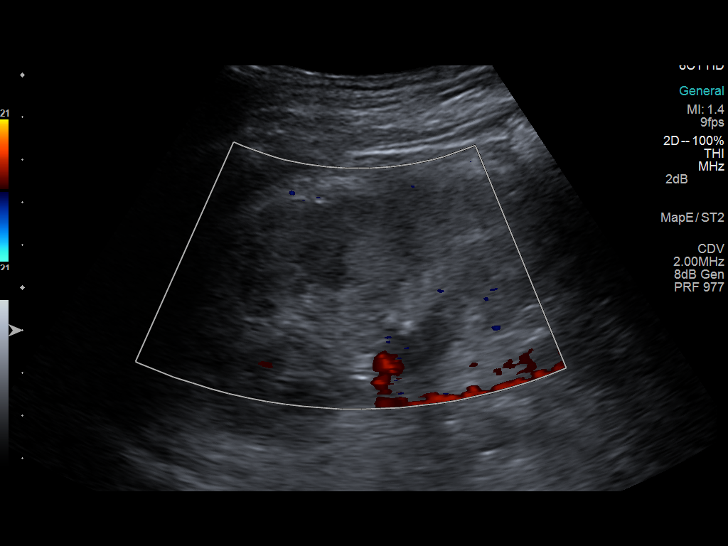
[im 16/23]
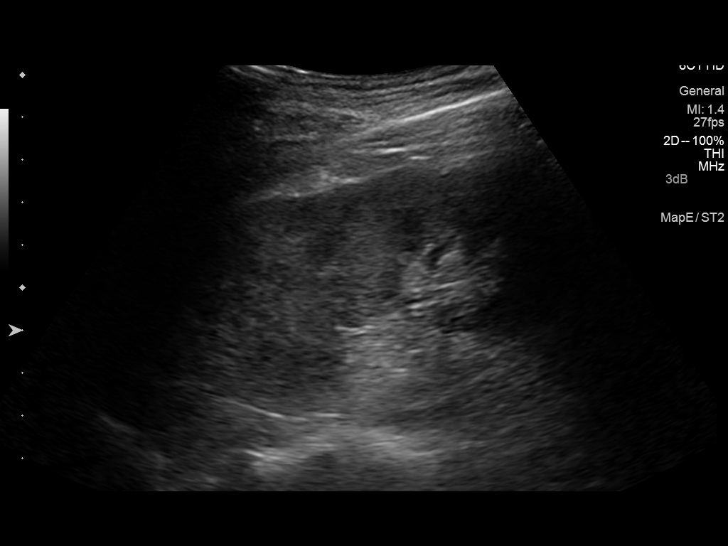
[im 18/23]
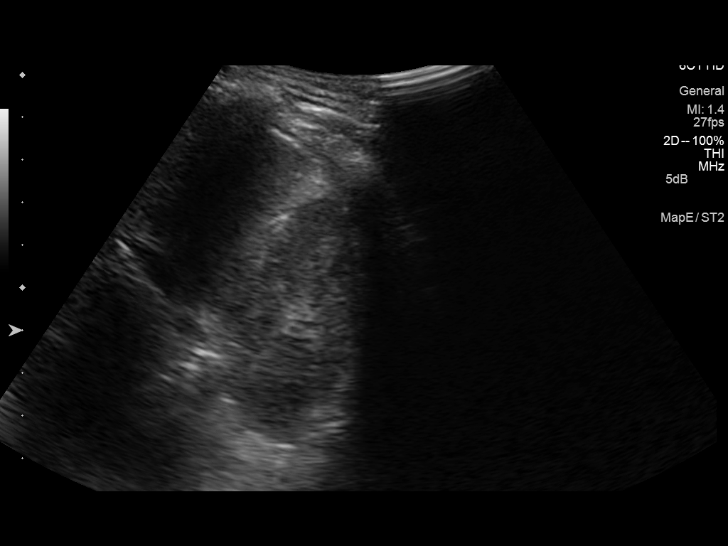
[im 19/23]
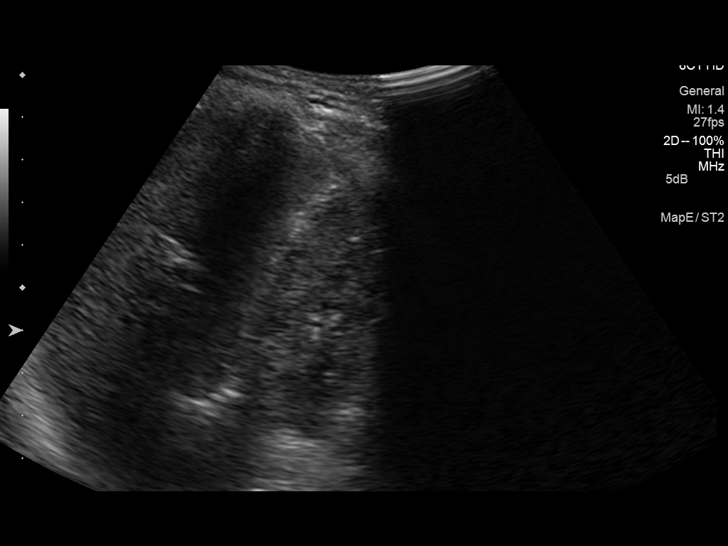
[im 21/23]
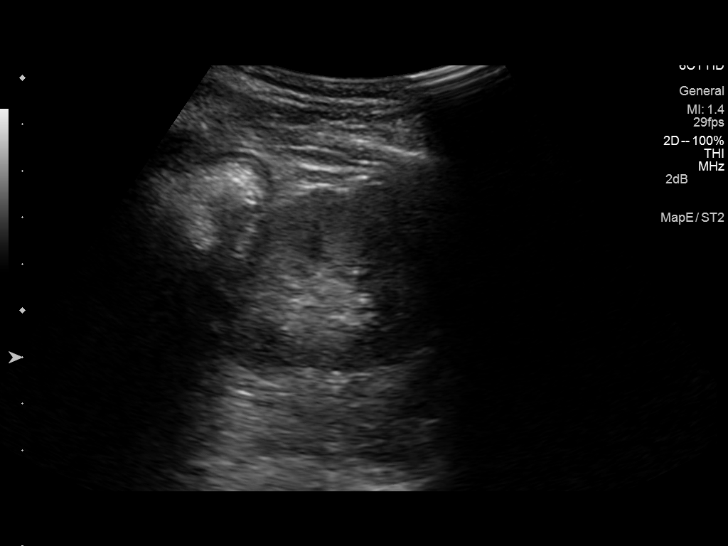
[im 23/23]
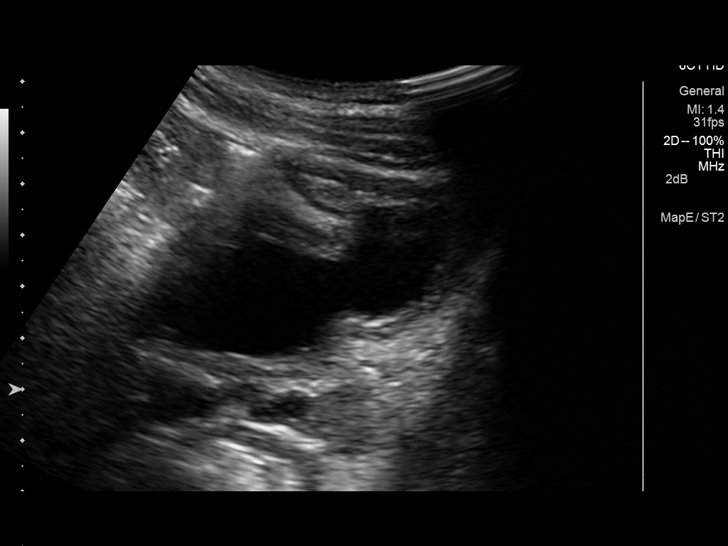

[14 of 23 positions shown; findings below may reference images not displayed]

FINDINGS: Right Kidney:

Length: 10.3 cm.. The echotexture is increased and is equal to or
slightly greater than that of the adjacent liver. There is no
hydronephrosis or focal mass.

Left Kidney:

Length: 10 cm. Increased echotexture is noted diffusely. There is no
hydronephrosis or focal mass.

Bladder:

Appears normal for degree of bladder distention.
IMPRESSION: Bilaterally increased cortical echotexture consistent with medical
renal disease. There is no evidence of obstruction.

## 2016-01-13 IMAGING — CR DG CHEST 2V
2 series · 2 of 2 positions shown · non-contrast
Comparison: 10/06/2013.

CLINICAL DATA: Hypoglycemia.

EXAM:
CHEST  2 VIEW

[w chest pa]
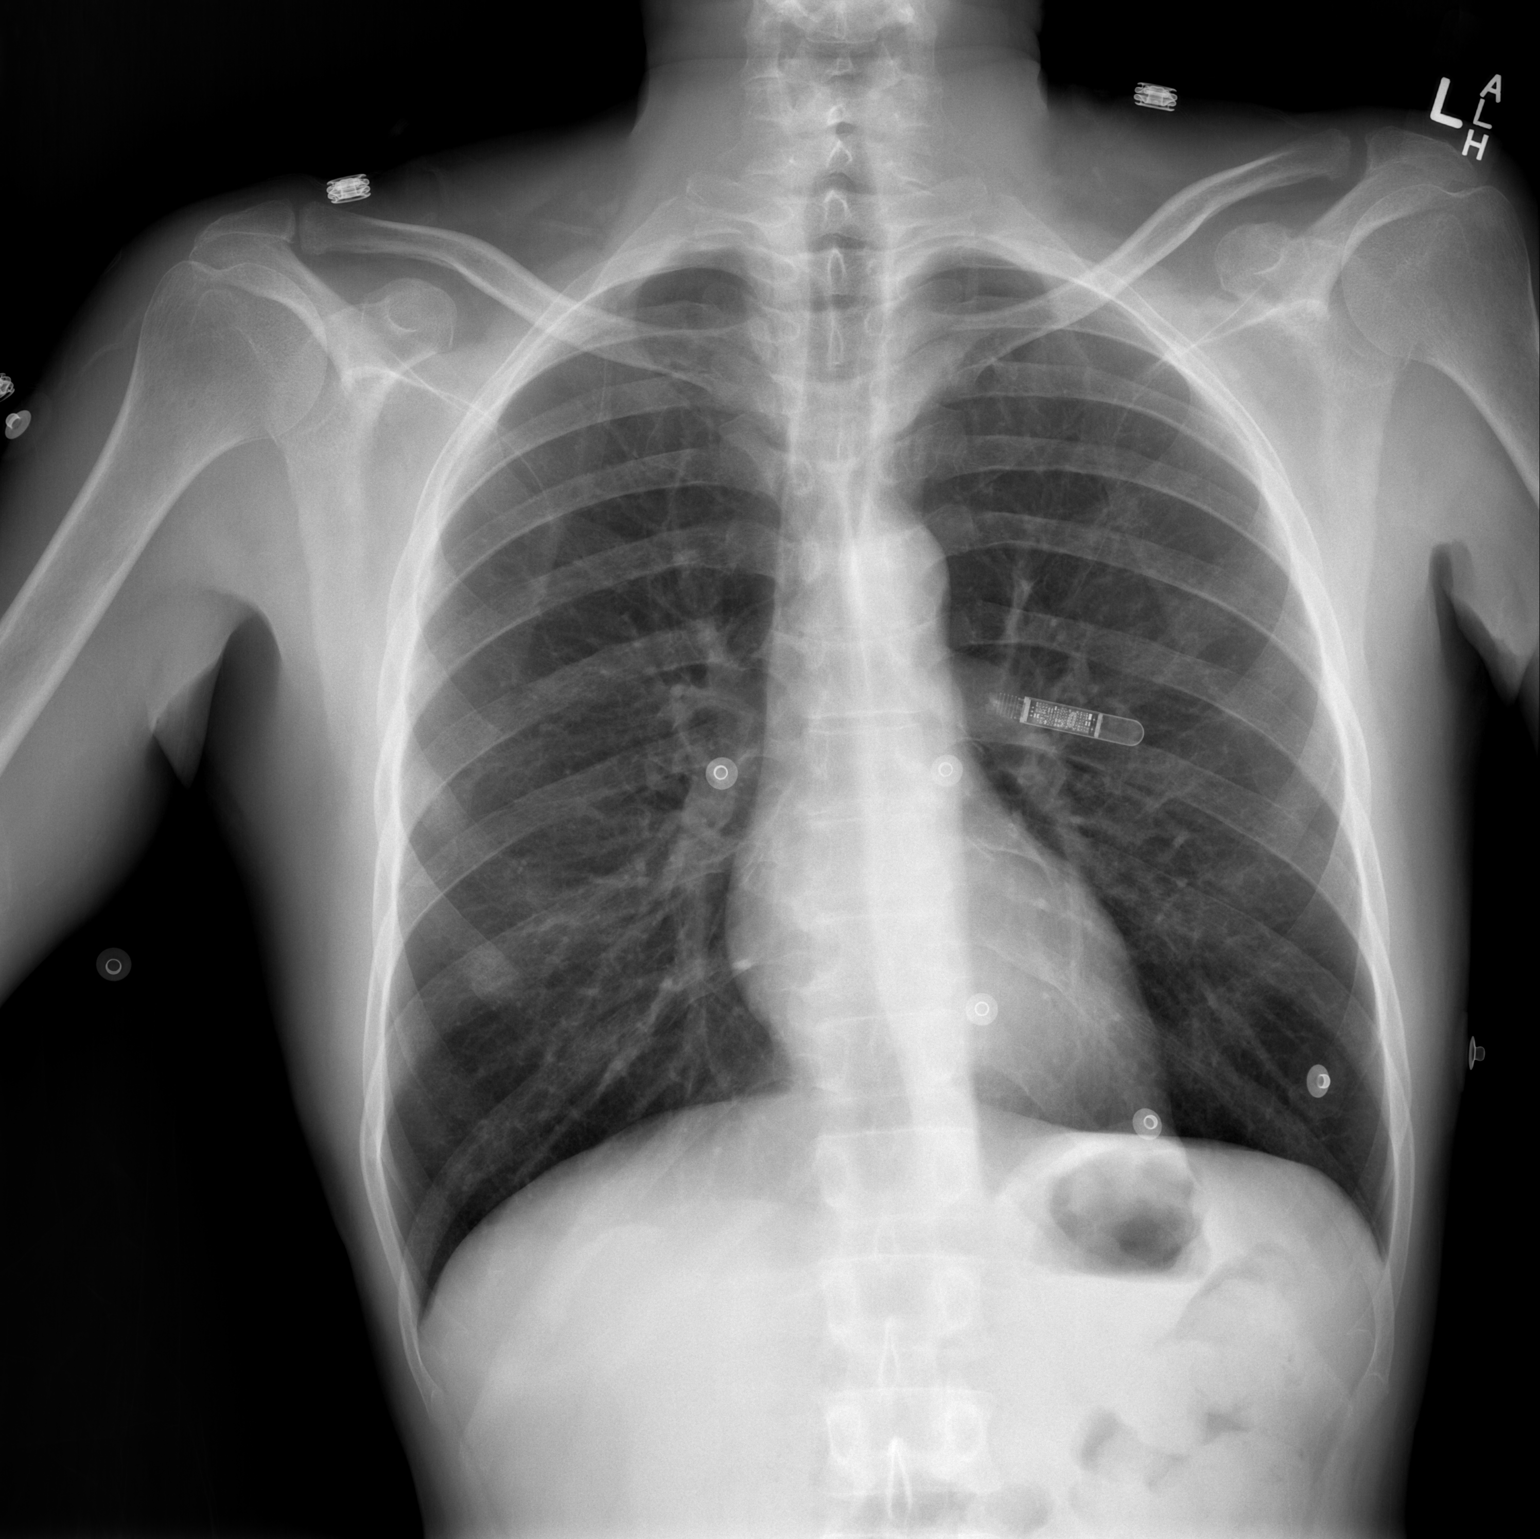

[w chest lat]
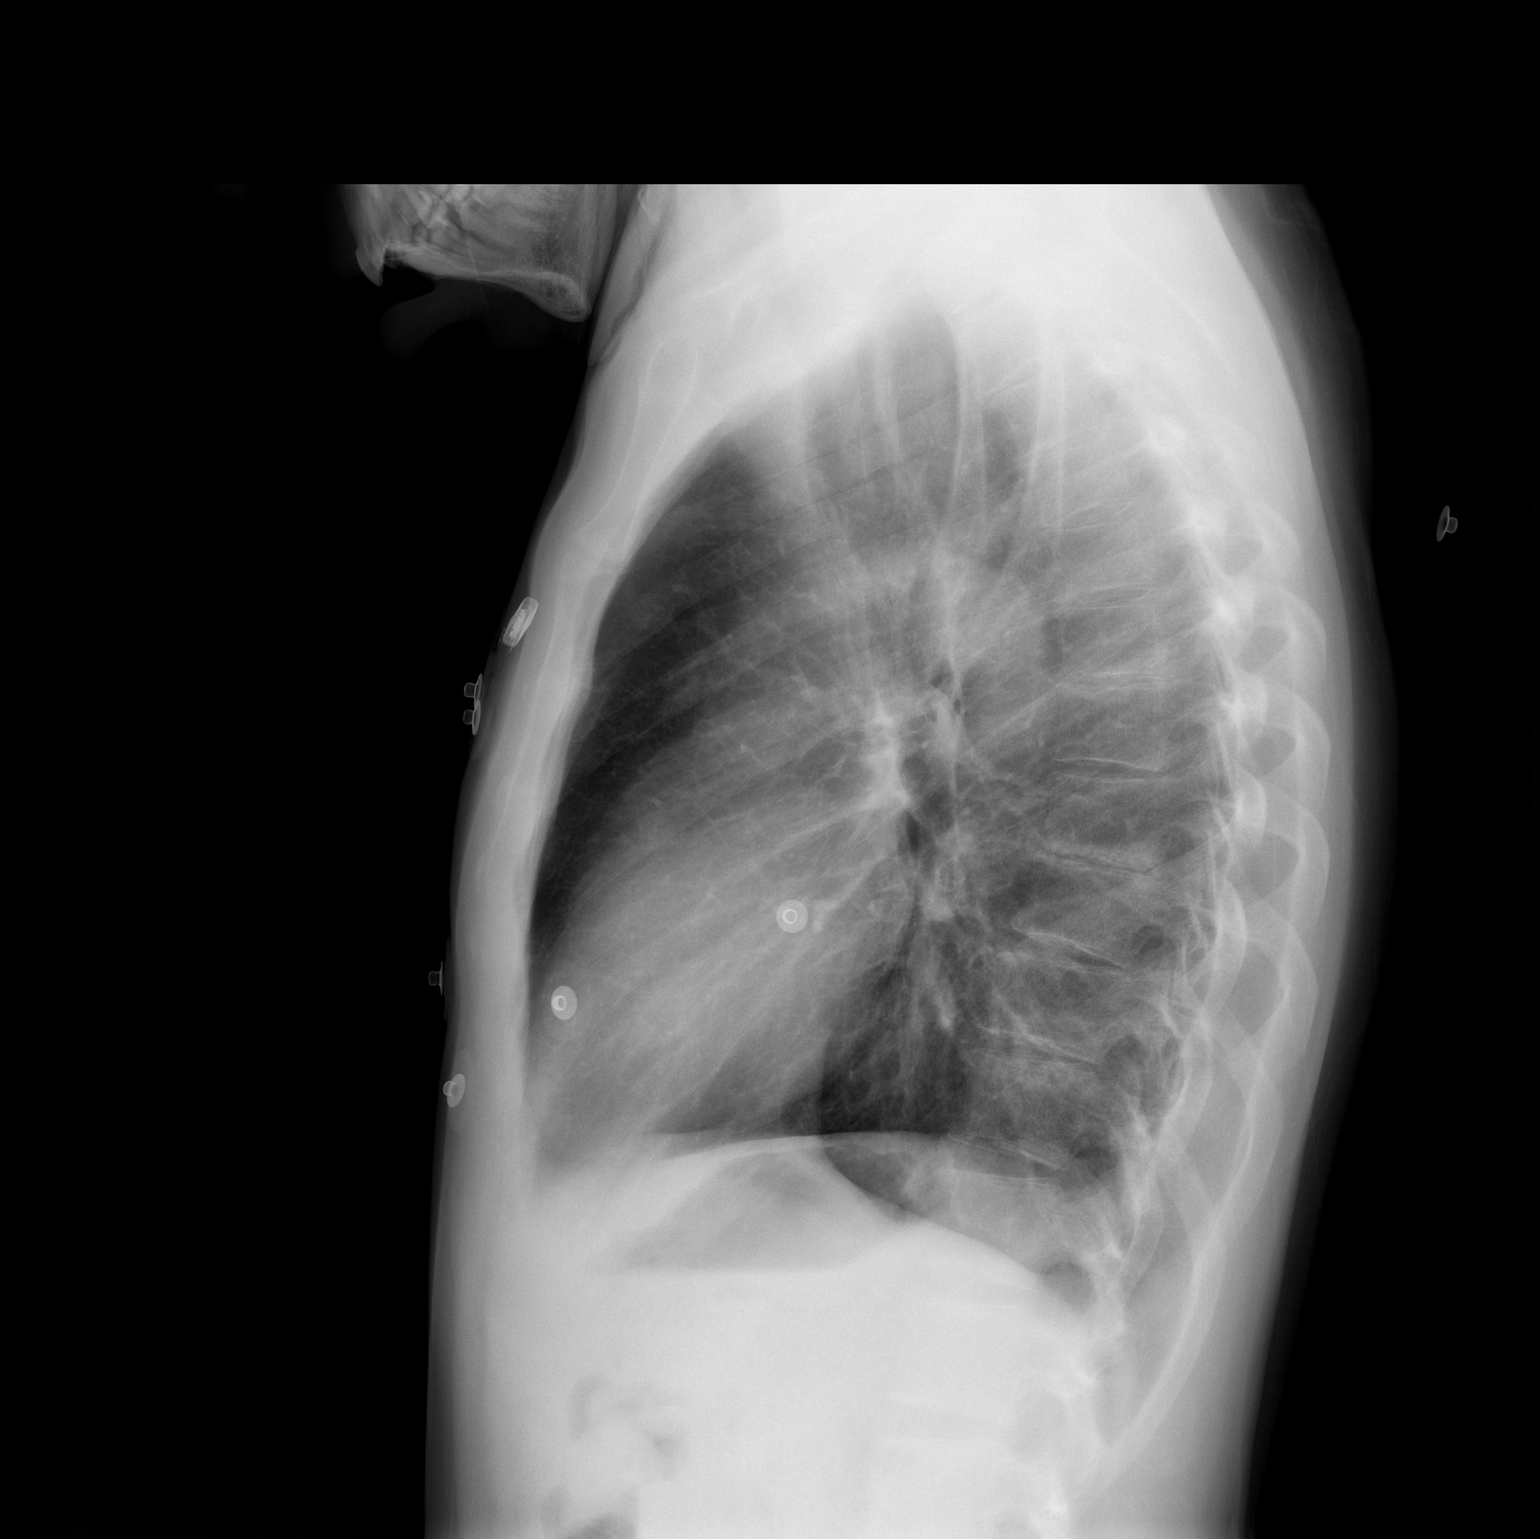

[2 of 2 positions shown; findings below may reference images not displayed]

FINDINGS: The lungs are clear without focal infiltrate, edema, pneumothorax or
pleural effusion. The cardiopericardial silhouette is within normal
limits for size. Imaged bony structures of the thorax are intact.
IMPRESSION: Interval resolution of left lung airspace disease. No acute
cardiopulmonary findings on today's study.

## 2016-01-13 IMAGING — CT CT HEAD W/O CM
2 series · 16 of 30 positions shown, 20 images · non-contrast
Comparison: Head CT scan 10/06/2013.

CLINICAL DATA: Altered level of consciousness.

EXAM:
CT HEAD WITHOUT CONTRAST
TECHNIQUE: Contiguous axial images were obtained from the base of the skull
through the vertex without intravenous contrast.

[Series 201: head w/o, idose (1) · axial · non-contrast · 0.49mm/px · z∈[+87,+207]mm · 13 of 30 slices shown, 17 images]
[im 3/30  brain]
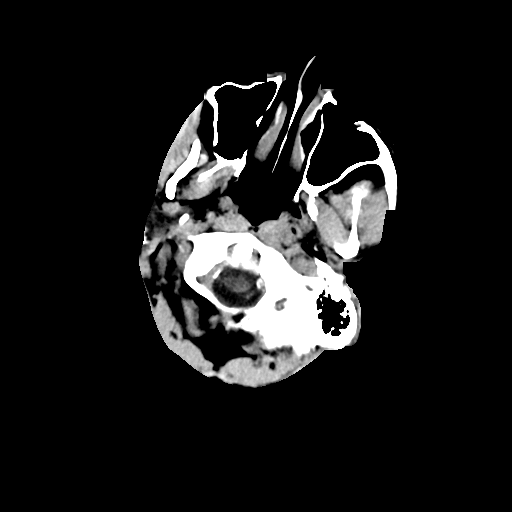
[im 3/30  bone]
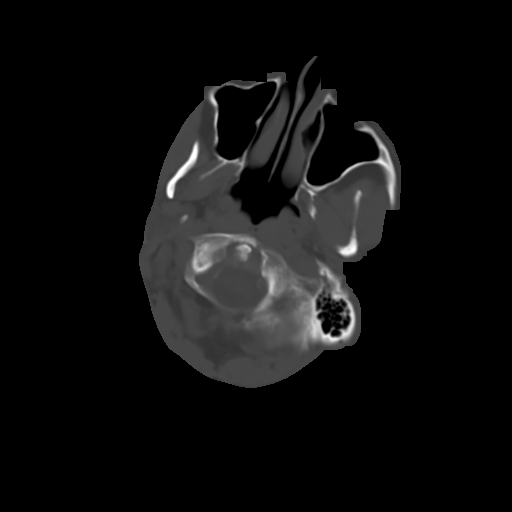
[im 5/30  brain]
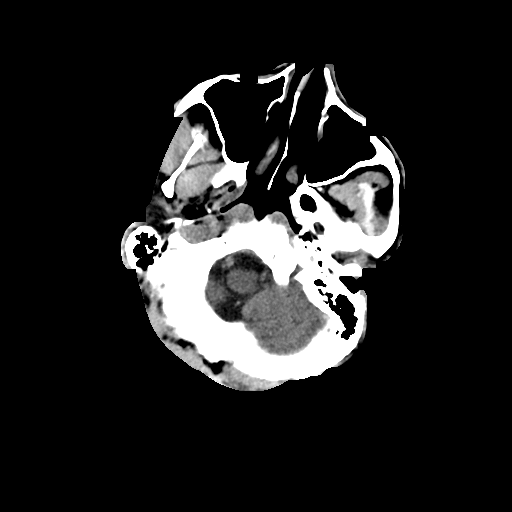
[im 7/30  brain]
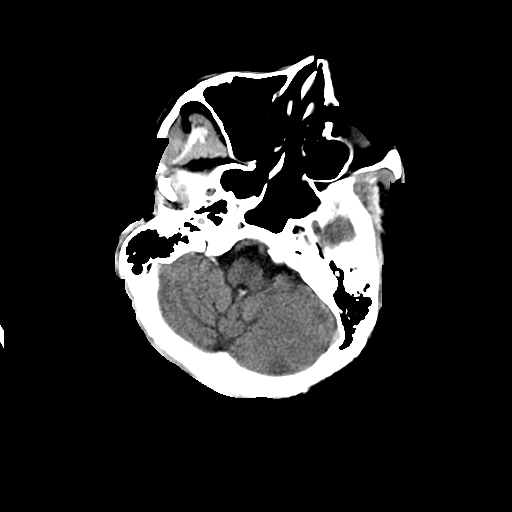
[im 9/30  brain]
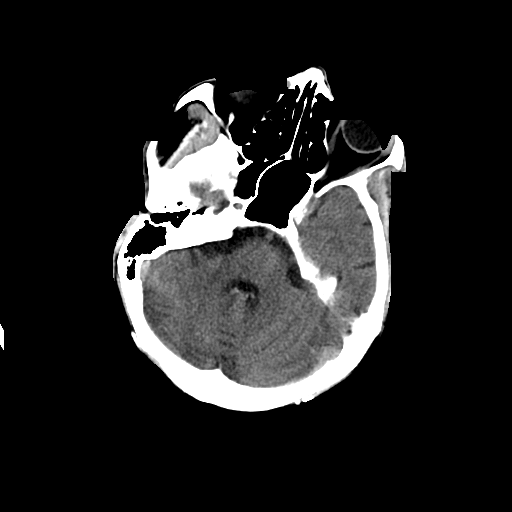
[im 11/30  brain]
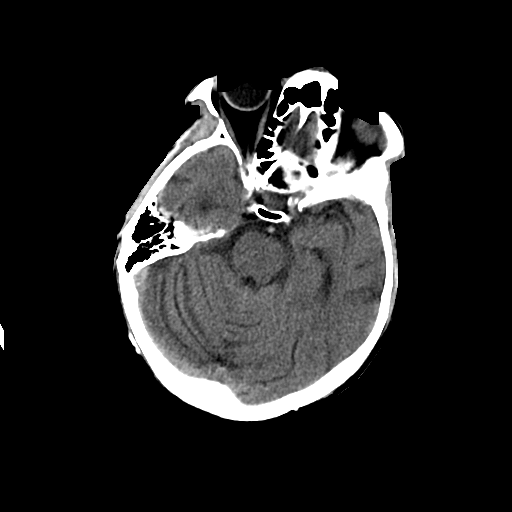
[im 11/30  bone]
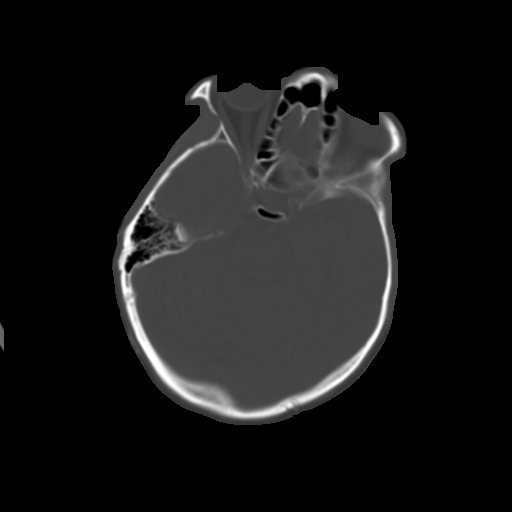
[im 13/30  brain]
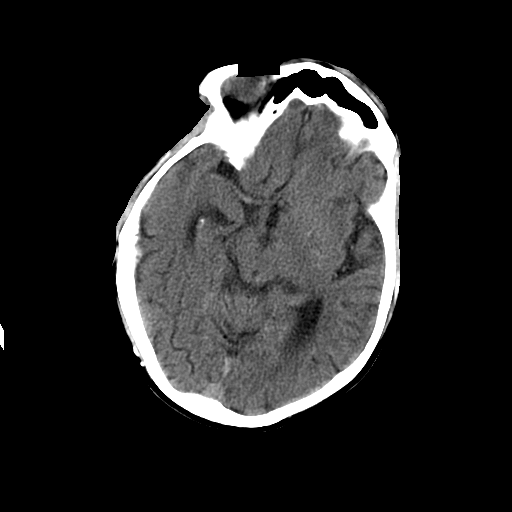
[im 15/30  brain]
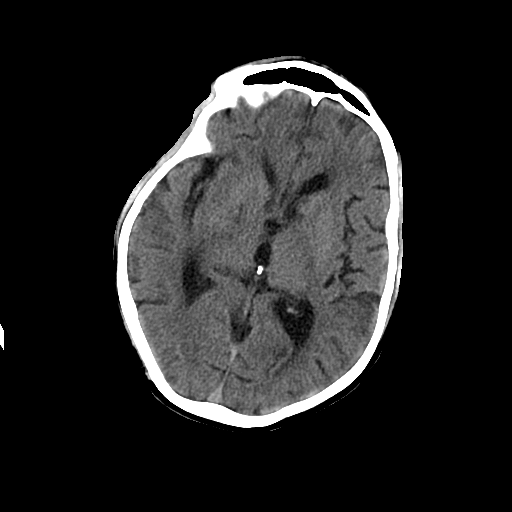
[im 17/30  brain]
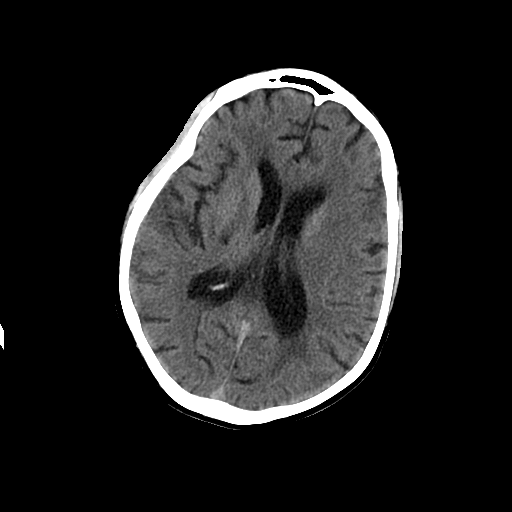
[im 19/30  brain]
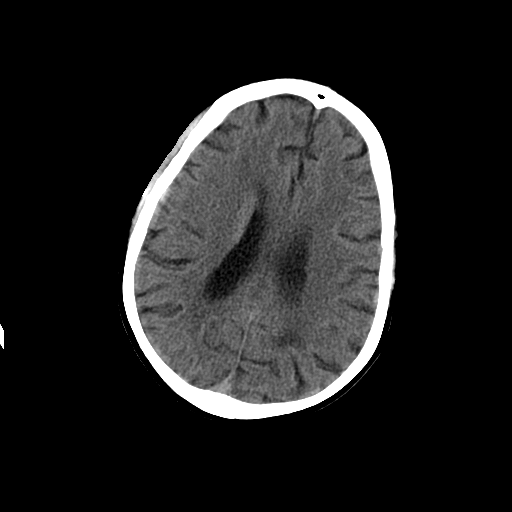
[im 19/30  bone]
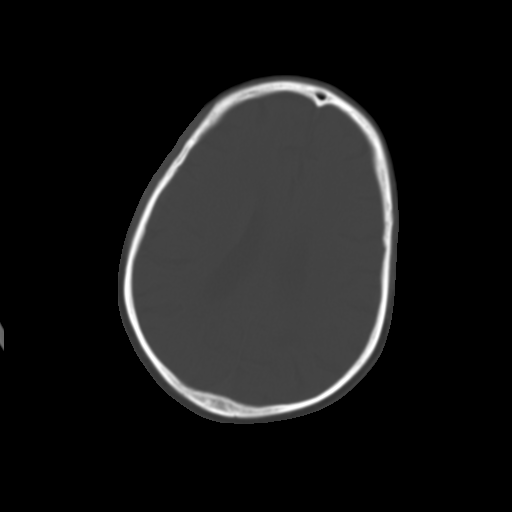
[im 21/30  brain]
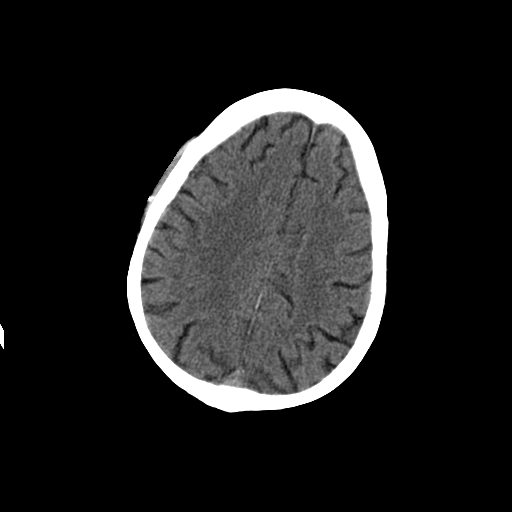
[im 23/30  brain]
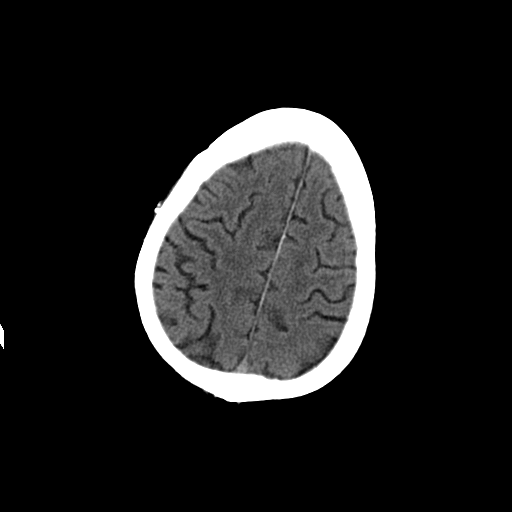
[im 25/30  brain]
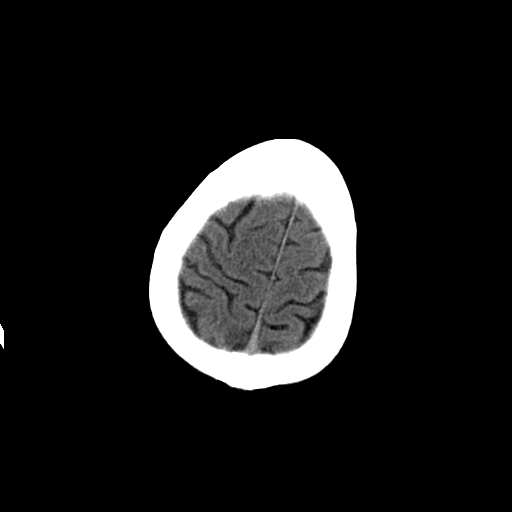
[im 27/30  brain]
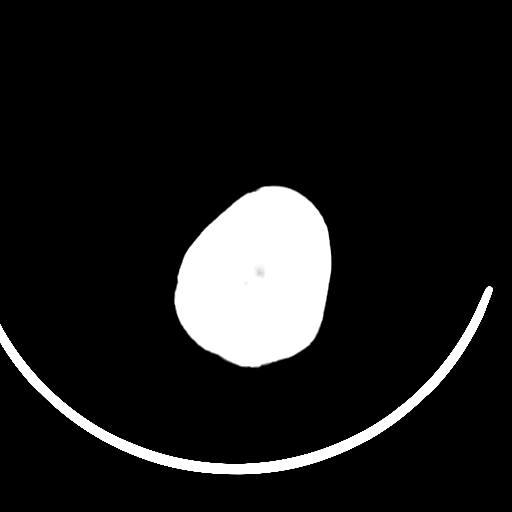
[im 27/30  bone]
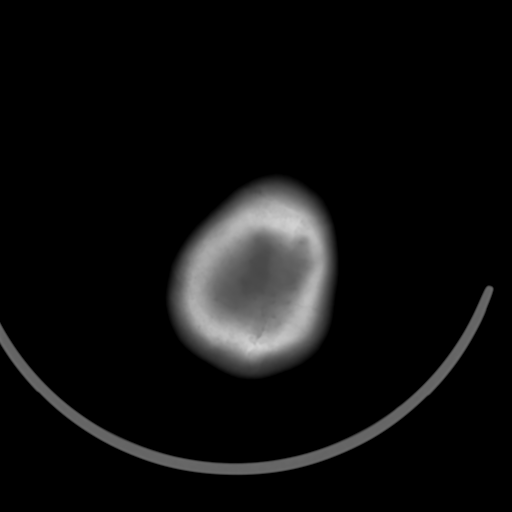

[Series 202: head w/o bone, idose (1) · axial · non-contrast · 0.49mm/px · z∈[+87,+127]mm · 3 of 30 slices shown]
[im 3/30  bone]
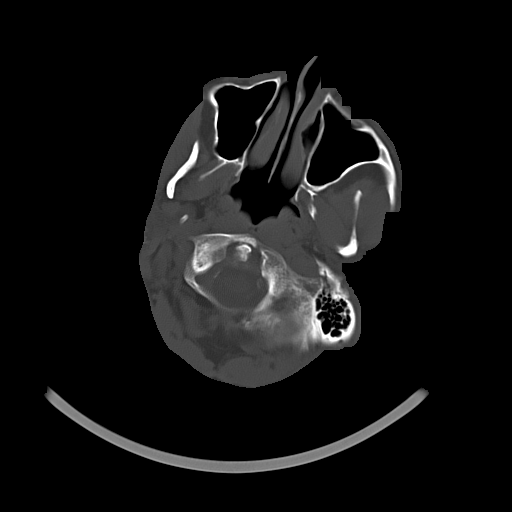
[im 7/30  bone]
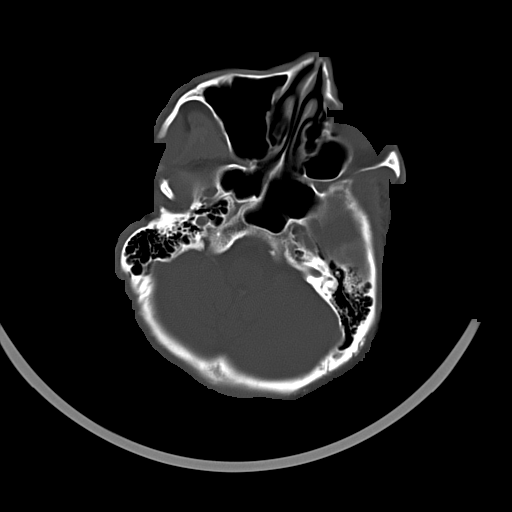
[im 11/30  bone]
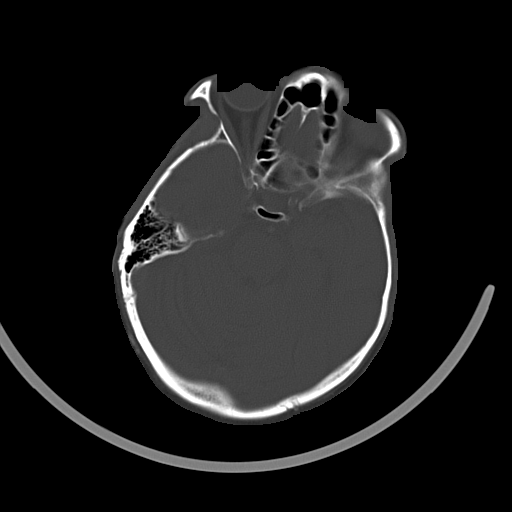

[16 of 30 positions shown; findings below may reference images not displayed]

FINDINGS: Scattered areas of hypoattenuation in the subcortical and
periventricular deep white matter are again seen consistent chronic
microvascular ischemic change. The brain is mildly atrophic. No
evidence of acute abnormality including infarct, hemorrhage, mass
lesion, mass effect, midline shift or abnormal extra-axial fluid
collection is identified. There is no hydrocephalus or
pneumocephalus. The calvarium is intact.
IMPRESSION: No acute finding.  Stable compared to prior exam.
